# Patient Record
Sex: Female | Born: 1955 | Race: White | Hispanic: No | Marital: Married | Smoking: Former smoker
Health system: Southern US, Community
[De-identification: ages and names within clinical notes are randomized; demographics above are authoritative.]

## PROBLEM LIST (undated history)

## (undated) DIAGNOSIS — M1990 Unspecified osteoarthritis, unspecified site: Secondary | ICD-10-CM

## (undated) DIAGNOSIS — E782 Mixed hyperlipidemia: Secondary | ICD-10-CM

## (undated) DIAGNOSIS — F419 Anxiety disorder, unspecified: Secondary | ICD-10-CM

## (undated) DIAGNOSIS — E785 Hyperlipidemia, unspecified: Secondary | ICD-10-CM

## (undated) DIAGNOSIS — I10 Essential (primary) hypertension: Secondary | ICD-10-CM

## (undated) DIAGNOSIS — E119 Type 2 diabetes mellitus without complications: Secondary | ICD-10-CM

## (undated) DIAGNOSIS — M545 Low back pain, unspecified: Secondary | ICD-10-CM

## (undated) DIAGNOSIS — R7303 Prediabetes: Secondary | ICD-10-CM

## (undated) DIAGNOSIS — T4145XA Adverse effect of unspecified anesthetic, initial encounter: Secondary | ICD-10-CM

## (undated) DIAGNOSIS — G709 Myoneural disorder, unspecified: Secondary | ICD-10-CM

## (undated) DIAGNOSIS — T8859XA Other complications of anesthesia, initial encounter: Secondary | ICD-10-CM

## (undated) DIAGNOSIS — IMO0001 Reserved for inherently not codable concepts without codable children: Secondary | ICD-10-CM

## (undated) DIAGNOSIS — Z8489 Family history of other specified conditions: Secondary | ICD-10-CM

## (undated) DIAGNOSIS — M549 Dorsalgia, unspecified: Secondary | ICD-10-CM

## (undated) DIAGNOSIS — T7840XA Allergy, unspecified, initial encounter: Secondary | ICD-10-CM

## (undated) DIAGNOSIS — I2510 Atherosclerotic heart disease of native coronary artery without angina pectoris: Secondary | ICD-10-CM

## (undated) DIAGNOSIS — M79604 Pain in right leg: Secondary | ICD-10-CM

## (undated) DIAGNOSIS — I639 Cerebral infarction, unspecified: Secondary | ICD-10-CM

## (undated) HISTORY — PX: CLOSED REDUCTION ULNAR SHAFT: SHX5775

## (undated) HISTORY — DX: Cerebral infarction, unspecified: I63.9

## (undated) HISTORY — DX: Anxiety disorder, unspecified: F41.9

## (undated) HISTORY — DX: Allergy, unspecified, initial encounter: T78.40XA

## (undated) HISTORY — PX: SUPRACERVICAL ABDOMINAL HYSTERECTOMY: SHX5393

## (undated) HISTORY — DX: Dorsalgia, unspecified: M54.9

## (undated) HISTORY — PX: JOINT REPLACEMENT: SHX530

## (undated) HISTORY — PX: NASAL SINUS SURGERY: SHX719

## (undated) HISTORY — PX: ABDOMINAL HYSTERECTOMY: SHX81

## (undated) HISTORY — PX: UPPER GASTROINTESTINAL ENDOSCOPY: SHX188

## (undated) HISTORY — PX: APPENDECTOMY: SHX54

## (undated) HISTORY — DX: Low back pain: M54.5

## (undated) HISTORY — DX: Hyperlipidemia, unspecified: E78.5

## (undated) HISTORY — DX: Low back pain, unspecified: M54.50

## (undated) HISTORY — PX: ULNAR NERVE REPAIR: SHX2594

## (undated) HISTORY — PX: CATARACT EXTRACTION W/ INTRAOCULAR LENS IMPLANT: SHX1309

## (undated) HISTORY — PX: FOOT SURGERY: SHX648

## (undated) HISTORY — PX: SPINE SURGERY: SHX786

## (undated) HISTORY — DX: Pain in right leg: M79.604

## (undated) HISTORY — PX: BACK SURGERY: SHX140

## (undated) HISTORY — PX: CARPAL TUNNEL RELEASE: SHX101

## (undated) HISTORY — PX: COLONOSCOPY: SHX174

---
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
## 1975-01-22 HISTORY — PX: APPENDECTOMY: SHX54

## 1983-01-22 HISTORY — PX: SUPRACERVICAL ABDOMINAL HYSTERECTOMY: SHX5393

## 1992-01-22 HISTORY — PX: NASAL SINUS SURGERY: SHX719

## 1998-01-03 ENCOUNTER — Other Ambulatory Visit: Admission: RE | Admit: 1998-01-03 | Discharge: 1998-01-03 | Payer: Self-pay | Admitting: Obstetrics and Gynecology

## 1998-02-09 ENCOUNTER — Encounter: Payer: Self-pay | Admitting: Obstetrics and Gynecology

## 1998-02-09 ENCOUNTER — Ambulatory Visit (HOSPITAL_COMMUNITY): Admission: RE | Admit: 1998-02-09 | Discharge: 1998-02-09 | Payer: Self-pay | Admitting: Obstetrics and Gynecology

## 1998-12-06 ENCOUNTER — Ambulatory Visit (HOSPITAL_COMMUNITY): Admission: RE | Admit: 1998-12-06 | Discharge: 1998-12-06 | Payer: Self-pay | Admitting: Gastroenterology

## 1998-12-06 ENCOUNTER — Encounter: Payer: Self-pay | Admitting: Gastroenterology

## 1999-02-20 ENCOUNTER — Other Ambulatory Visit: Admission: RE | Admit: 1999-02-20 | Discharge: 1999-02-20 | Payer: Self-pay | Admitting: Obstetrics and Gynecology

## 1999-02-20 ENCOUNTER — Encounter (INDEPENDENT_AMBULATORY_CARE_PROVIDER_SITE_OTHER): Payer: Self-pay | Admitting: Specialist

## 1999-11-14 ENCOUNTER — Encounter: Admission: RE | Admit: 1999-11-14 | Discharge: 2000-02-12 | Payer: Self-pay | Admitting: Family Medicine

## 2002-04-19 ENCOUNTER — Encounter: Payer: Self-pay | Admitting: Orthopedic Surgery

## 2002-04-19 ENCOUNTER — Encounter: Admission: RE | Admit: 2002-04-19 | Discharge: 2002-04-19 | Payer: Self-pay | Admitting: Orthopedic Surgery

## 2002-04-20 ENCOUNTER — Ambulatory Visit (HOSPITAL_BASED_OUTPATIENT_CLINIC_OR_DEPARTMENT_OTHER): Admission: RE | Admit: 2002-04-20 | Discharge: 2002-04-20 | Payer: Self-pay | Admitting: Orthopedic Surgery

## 2006-05-07 ENCOUNTER — Ambulatory Visit (HOSPITAL_COMMUNITY): Admission: RE | Admit: 2006-05-07 | Discharge: 2006-05-07 | Payer: Self-pay | Admitting: Neurosurgery

## 2007-07-28 ENCOUNTER — Ambulatory Visit: Payer: Self-pay | Admitting: Gastroenterology

## 2007-09-24 ENCOUNTER — Ambulatory Visit: Payer: Self-pay | Admitting: Gastroenterology

## 2008-03-03 ENCOUNTER — Encounter: Admission: RE | Admit: 2008-03-03 | Discharge: 2008-03-03 | Payer: Self-pay | Admitting: Neurosurgery

## 2008-10-24 ENCOUNTER — Encounter: Admission: RE | Admit: 2008-10-24 | Discharge: 2008-10-24 | Payer: Self-pay | Admitting: Neurosurgery

## 2009-09-27 ENCOUNTER — Inpatient Hospital Stay (HOSPITAL_COMMUNITY): Admission: RE | Admit: 2009-09-27 | Discharge: 2009-09-29 | Payer: Self-pay | Admitting: Neurosurgery

## 2009-10-31 ENCOUNTER — Encounter: Admission: RE | Admit: 2009-10-31 | Discharge: 2009-10-31 | Payer: Self-pay | Admitting: Neurosurgery

## 2009-11-28 ENCOUNTER — Encounter: Admission: RE | Admit: 2009-11-28 | Discharge: 2009-11-28 | Payer: Self-pay | Admitting: Neurosurgery

## 2010-02-22 NOTE — Procedures (Signed)
Summary: Colonoscopy   Colonoscopy  Procedure date:  09/24/2007  Findings:      Location:  [facility].    Procedures Next Due Date:    Colonoscopy: 09/2017  Patient Name: Lauren Briggs, Lauren Briggs MRN:  Procedure Procedures: Colonoscopy CPT: 16109.  Personnel: Endoscopist: Venita Lick. Russella Dar, MD, Clementeen Graham.  Exam Location: Exam performed in Outpatient Clinic. Outpatient  Patient Consent: Procedure, Alternatives, Risks and Benefits discussed, consent obtained, from patient. Consent was obtained by the RN.  Indications  Average Risk Screening Routine.  History  Current Medications: Patient is not currently taking Coumadin.  Pre-Exam Physical: Performed Sep 24, 2007. Cardio-pulmonary exam, Rectal exam, HEENT exam , Abdominal exam, Mental status exam WNL.  Comments: Pt. history reviewed/updated, physical exam performed prior to initiation of sedation?Yes Exam Exam: Extent of exam reached: Cecum, extent intended: Cecum.  The cecum was identified by appendiceal orifice and IC valve. Time to Cecum: 00:03: 20. Time for Withdrawl: 00:09:02. Colon retroflexion performed. Images taken. ASA Classification: I. Tolerance: excellent.  Monitoring: Pulse and BP monitoring, Oximetry used. Supplemental O2 given.  Colon Prep Used MoviPrep for colon prep. Prep results: excellent.  Sedation Meds: Patient assessed and found to be appropriate for moderate (conscious) sedation. Fentanyl 100 mcg. given IV. Versed 9 mg. given IV.  Findings NORMAL EXAM: Cecum to Descending Colon.  DIVERTICULOSIS: Sigmoid Colon. Not bleeding. ICD9: Diverticulosis: 562.10. Comments: mild.  NORMAL EXAM: Rectum.   Assessment  Diagnoses: 562.10: Diverticulosis.   Events  Unplanned Interventions: No intervention was required.  Unplanned Events: There were no complications. Plans  Post Exam Instructions: Post sedation instructions given.  Patient Education: Patient given standard instructions for:  Diverticulosis.  Disposition: After procedure patient sent to recovery. After recovery patient sent home.  Scheduling/Referral: Colonoscopy, to [facility] T. Russella Dar, MD, [facility], around Sep 23, 2017.      UE:[id number] Mahaffey, MD   This report was created from the original endoscopy report, which was reviewed and signed by the above listed endoscopist.

## 2010-02-22 NOTE — Miscellaneous (Signed)
Summary: [facility] Previsit/prep  Clinical Lists Changes  Medications: Added new medication of MOVIPREP 100 GM  SOLR (PEG-KCL-NACL-NASULF-NA ASC-C) As per prep instructions. - Signed Rx of MOVIPREP 100 GM  SOLR (PEG-KCL-NACL-NASULF-NA ASC-C) As per prep instructions.;  #1 x 0;  Signed;  Entered by: Wyona Almas RN;  Authorized by: Meryl Dare MD [facility];  Method used: Electronic Allergies: Added new allergy or adverse reaction of DARVON Observations: Added new observation of NKA: F (07/28/2007 8:15)    Prescriptions: MOVIPREP 100 GM  SOLR (PEG-KCL-NACL-NASULF-NA ASC-C) As per prep instructions.  #1 x 0   Entered by:   Wyona Almas RN   Authorized by:   Meryl Dare MD [facility]   Signed by:   Wyona Almas RN on 07/28/2007   Method used:   Electronically sent to ...       [facility] # [phone]*       [address]       [place], [place]  [address]       Ph: [phone]       Fax: [phone]   RxID:   [id number]

## 2010-03-06 ENCOUNTER — Other Ambulatory Visit: Payer: Self-pay | Admitting: Neurological Surgery

## 2010-03-06 ENCOUNTER — Ambulatory Visit
Admission: RE | Admit: 2010-03-06 | Discharge: 2010-03-06 | Disposition: A | Discharge status: Home / Self Care | Payer: BC Managed Care – PPO | Source: Ambulatory Visit | Attending: Neurological Surgery | Admitting: Neurological Surgery

## 2010-03-06 DIAGNOSIS — M545 Low back pain, unspecified: Secondary | ICD-10-CM

## 2010-04-05 LAB — SURGICAL PCR SCREEN
MRSA, PCR: NEGATIVE
Staphylococcus aureus: NEGATIVE

## 2010-04-05 LAB — CBC
HCT: 41 % (ref 36.0–46.0)
Hemoglobin: 13.7 g/dL (ref 12.0–15.0)
MCH: 31.6 pg (ref 26.0–34.0)
MCHC: 33.4 g/dL (ref 30.0–36.0)
MCV: 94.5 fL (ref 78.0–100.0)
Platelets: 256 10*3/uL (ref 150–400)
RBC: 4.34 MIL/uL (ref 3.87–5.11)
RDW: 12.5 % (ref 11.5–15.5)
WBC: 5.5 10*3/uL (ref 4.0–10.5)

## 2010-04-05 LAB — TYPE AND SCREEN
ABO/RH(D): O POS
Antibody Screen: NEGATIVE

## 2010-04-05 LAB — ABO/RH: ABO/RH(D): O POS

## 2010-05-29 ENCOUNTER — Other Ambulatory Visit: Payer: Self-pay | Admitting: Neurosurgery

## 2010-05-29 ENCOUNTER — Ambulatory Visit
Admission: RE | Admit: 2010-05-29 | Discharge: 2010-05-29 | Disposition: A | Discharge status: Home / Self Care | Payer: BC Managed Care – PPO | Source: Ambulatory Visit | Attending: Neurosurgery | Admitting: Neurosurgery

## 2010-05-29 DIAGNOSIS — M47817 Spondylosis without myelopathy or radiculopathy, lumbosacral region: Secondary | ICD-10-CM

## 2010-05-29 DIAGNOSIS — M5126 Other intervertebral disc displacement, lumbar region: Secondary | ICD-10-CM

## 2010-05-29 DIAGNOSIS — M5137 Other intervertebral disc degeneration, lumbosacral region: Secondary | ICD-10-CM

## 2010-06-08 NOTE — Op Note (Signed)
NAME:  Lauren Briggs, Lauren Briggs                 ACCOUNT NO.:  [id number]   MEDICAL RECORD NO.:  [id number]          PATIENT TYPE:  OIB   LOCATION:  3006                         FACILITY:  [facility]   PHYSICIAN:  Donalee Citrin, M.D.        DATE OF BIRTH:  January 11, 1956   DATE OF PROCEDURE:  05/07/2006  DATE OF DISCHARGE:                               OPERATIVE REPORT   PREOPERATIVE DIAGNOSIS:  Left L3 radiculopathy from extraforaminal and  foraminal disk herniation, L3-4, left.   PROCEDURE:  Extraforaminal approach to lumbar microdiskectomy.   SURGEON:  Donalee Citrin, M.D.   [name].   ANESTHESIA:  General endotracheal.   HISTORY OF PRESENT ILLNESS:  The patient is a very pleasant 55 year old  female who has had longstanding back and left buttock and hip pain  radiating down the front of her left thigh to her knee.  This has been  refractory to all forms of conservative treatment.  MRI scan showed  sizeable foraminal disk herniation causing severe stenosis on the 3  root.  The patient failed all forms of conservative treatment  preoperatively and has had some trace weakness of her left quadriceps  and some decreased knee jerk and due to the patient's failure of  conservative treatment, physical exam, and MRI findings, the patient is  recommended extraforaminal discectomy at this level.  The risks and  benefits of the operation were explained to the patient who understood  and agreed to procedure.   The patient was brought to the OR, induced under general anesthesia,  placed prone on the Wilson frame.  Back was prepped and draped in the  usual sterile fashion.  Preoperative x-ray localized the L3-4 disk  space.  After infiltration of 10 mL of lidocaine with epinephrine, a  midline incision made with Bovie electrocautery was used to take down  subperiosteal dissection carried out on the lamina and facet complex at  L3-4 and the L3 transverse process was identified, marked, and confirmed  by  x-ray.  Then inferior to the L3 transverse process, the endocervix of  the facet complex lateral pars and superior aspect of the facet complex  at L4 was drilled down to gain access to the extraforaminal compartment.  At this point, the operating microscope was draped, brought into the  field under microscopic illumination using a #4 Penfield.  The  undersurface of the lateral pars was dissected off of the  intertransverse ligament and this was removed in a piecemeal fashion  with 2-3 mm Kerrison punch.  Then the L3 nerve root was immediately  identified and the L3-4 disk space and the lateral disk herniation was  immediately identified, causing severe stenosis and displacement of the  L3 nerve root superiorly.  So epidural branch was coagulated.  Annulotomy was made in the disk space.  Several osteophytes were removed  from immediately at the undersurface of the 3 root.  Then the interspace  was entered with pituitary rongeurs and cleaned out.  Aggressive  foraminotomy was continued of the 3 root and aggressive discectomy to  confirm all fragments had  been removed.  At the end of the discectomy  and foraminotomy, there was no further stenosis on the 3 root.  A  __________ easily passed up proximally and distally and it was felt to  be widely decompressed.  The wound was then copiously irrigated and  meticulous hemostasis was maintained.  Gelfoam was overlayed on top of  the nerve root in the  extraforaminal compartment.  The wound was closed in layers with  interrupted Vicryl and running 4-0 subcuticular.  Benzoin and Steri-  Strips were applied.  The patient went to recovery room in stable  condition.  At the end of the case, needle counts and sponge counts were  correct.           ______________________________  Donalee Citrin, M.D.     GC/MEDQ  D:  05/07/2006  T:  05/07/2006  Job:  [id number]

## 2010-06-08 NOTE — Op Note (Signed)
NAME:  Lauren Briggs, Lauren Briggs                           ACCOUNT NO.:  [id number]   MEDICAL RECORD NO.:  [id number]                   PATIENT TYPE:  AMB   LOCATION:  DSC                                  FACILITY:  [facility]   PHYSICIAN:  Katy Fitch. Naaman Plummer., M.D.          DATE OF BIRTH:  03-17-1955   DATE OF PROCEDURE:  04/20/2002  DATE OF DISCHARGE:                                 OPERATIVE REPORT   PREOPERATIVE DIAGNOSIS:  Chronic ulnocarpal abutment right wrist  unresponsive to activity modification, anti-inflammatory medication, and  rest with plain film evidence of a 2 mm ulnar plus variant and magnetic  resonance imaging evidence of chronic ulnocarpal abutment, a central  triangular fibrocartilage degenerative tear and carpal edema as a  consequence of chronic abutment.   POSTOPERATIVE DIAGNOSIS:  Chronic ulnocarpal abutment right wrist  unresponsive to activity modification, anti-inflammatory medication, and  rest with plain film evidence of a 2 mm ulnar plus variant and magnetic  resonance imaging evidence of chronic ulnocarpal abutment, a central  triangular fibrocartilage degenerative tear and carpal edema as a  consequence of chronic abutment.   OPERATION:  1. Diagnostic arthroscopy right wrist.  2. Arthroscopic debridement of triangular fibrocartilage and dorsal synovial     plica type lesion.  3. Resection of distal 3 mm of ulnar in the manner of Feldon utilizing an     oscillating saw and microcurets.   OPERATION:  Katy Fitch. Sypher, M.D.   ASSISTANT:  Jonni Sanger, P.A.   ANESTHESIA:  General by LMA.   SUPERVISING ANESTHESIOLOGIST:  Kaylyn Layer. Michelle Piper, M.D.   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  She is status post left wrist arthroscopy and triangular  fibrocartilage debridement for a similar predicament approximately four  years ago.   She had a  good response to simple debridement on the left.  She is also  status post bilateral carpal tunnel release.   During the past year, she has had progressive pain on the ulnar aspect of  her right wrist consistent with ulnocarpal abutment.  She did not respond to  rest, activity modification and anti-inflammatories.  An MRI of the wrist  was obtained by her primary care physician.  It was positive for chronic  ulnocarpal abutment with a triangular fibrocartilage tear.   Due to a failure to respond to nonoperative measures, she is brought to the  operating room at this time, anticipating diagnostic arthroscopy as well as  debridement of her joint and open distal ulnar resection.   DESCRIPTION OF PROCEDURE:  The patient is brought to the operating room and  placed in supine position upon the operating table.  Following anesthesia  consultation by Dr. Michelle Piper, general anesthesia by LMA was induced.   The right arm was prepped with Betadine soap  and solution and sterilely  draped.  1 gram of Ancef was administered as an IV prophylactic antibiotic.  Following exsanguination of the right arm with an Esmarch bandage, an  arterial tourniquet on the proximal brachium was inflated to 220 mmHg.   The procedure commenced with placement of the hand and wrist in a  distraction tower with finger traps on the index and long fingers and  counter traction on the proximal forearm.  10 pounds traction was applied.  The wrist was sounded with an 18 gauge needle and distended with sterile  saline.   The scope was introduced through a standard three port dorsal portal with  blunt technique.  Diagnostic arthroscopy revealed intact articular  cartilages surfaces on the scaphoid, radial aspect of the lunate and  triquetrum.  The distal radius was virtually normal appearing.  There was a  central degenerative tear of the triangular fibrocartilage.  The dorsal  aspect of the triangular fibrocartilage was very  firmly abutting the lunate  and there was significant chondromalacia on the adjacent surfaces of the  lunate and triquetrum.  This would correspond to the sites of edema noted on  the MRI.   We are unable to instrument the joint with a 2.9 mm suction shaver due to  the degree of decompression across the ulnocarpal space.  Therefore a 2 mm  shaver was utilized to debride a dorsal plica synovial shelf that had formed  due to the chronic abutment.   We then proceeded directly to the open distal ulnar section.  With the hand  and wrist still in the distraction tower, a curvilinear incision was  fashioned over the distal ulna dorsally.  Care was taken to identify the  dorsal sensory branches of the ulnar nerve followed by identifications of  the fifth and sixth dorsal compartments.  A distal radial ulnar joint  arthrotomy was accomplished revealing rather abutment synovial hypertrophy.  A rongeur was used to clear all synovium followed by identification of the  anatomy of the distal ulna.  A total of three Freer periosteal elevators  were placed to protect the joints, followed by use of a 9 mm oscillating saw  to resect the distal 3 mm of the ulna.   The wrist was studied in pronation and supination.  In pronation there  appeared to be residual abutment.  Therefore, with the wrist in full  pronation the oscillating saw was used to remove an additional 3 mm of bone  on the palmar aspect of the ulna with the wrist in full pronation.   PA and AP images of the wrist utilizing C-arm fluoroscope documented a very  satisfactory decompression.  The wound was thoroughly lavaged with sterile  saline under pressure utilizing a saline pump followed by repair of the  capsule of the distal radial ulnar joint with mattress sutures of 3-0  Ethibon and repair of the extensor retinaculum with mattress sutures of 3-0  Ethibon.  The skin was painted with intradermal 3-0 Prolene followed by placement and  a  voluminous gauze dressing and a sugar tong splint maintaining the wrist in  neutral position and the forearm in full supination.   There were no apparent complications.   For aftercare, the patient will be maintained in supination for three weeks.  She is given prescriptions for Percocet 5 mg one or two tablets p.o. q.4-6h.  p.r.n. pain, total of 24 tablets without refill.  Also Keflex 500 mg one  p.o. q.8h. x4 days as a  prophylactic antibiotic and ibuprofen 600 mg one  p.o. q.6h. p.r.n. pain.                                               Katy Fitch Naaman Plummer., M.D.   RVS/MEDQ  D:  04/20/2002  T:  04/20/2002  Job:  [id number]

## 2010-08-09 ENCOUNTER — Other Ambulatory Visit: Payer: Self-pay | Admitting: Neurosurgery

## 2010-08-09 ENCOUNTER — Ambulatory Visit
Admission: RE | Admit: 2010-08-09 | Discharge: 2010-08-09 | Disposition: A | Discharge status: Home / Self Care | Payer: BC Managed Care – PPO | Source: Ambulatory Visit | Attending: Neurosurgery | Admitting: Neurosurgery

## 2010-08-09 DIAGNOSIS — M545 Low back pain, unspecified: Secondary | ICD-10-CM

## 2010-08-09 DIAGNOSIS — M25559 Pain in unspecified hip: Secondary | ICD-10-CM

## 2010-08-10 ENCOUNTER — Ambulatory Visit
Admission: RE | Admit: 2010-08-10 | Discharge: 2010-08-10 | Disposition: A | Discharge status: Home / Self Care | Payer: BC Managed Care – PPO | Source: Ambulatory Visit | Attending: Neurosurgery | Admitting: Neurosurgery

## 2010-08-10 DIAGNOSIS — M545 Low back pain, unspecified: Secondary | ICD-10-CM

## 2010-08-10 DIAGNOSIS — M25559 Pain in unspecified hip: Secondary | ICD-10-CM

## 2010-12-05 ENCOUNTER — Other Ambulatory Visit: Payer: Self-pay | Admitting: Neurosurgery

## 2010-12-05 DIAGNOSIS — M545 Low back pain, unspecified: Secondary | ICD-10-CM

## 2010-12-27 ENCOUNTER — Ambulatory Visit
Admission: RE | Admit: 2010-12-27 | Discharge: 2010-12-27 | Disposition: A | Discharge status: Home / Self Care | Payer: BC Managed Care – PPO | Source: Ambulatory Visit | Attending: Neurosurgery | Admitting: Neurosurgery

## 2010-12-27 DIAGNOSIS — M545 Low back pain, unspecified: Secondary | ICD-10-CM

## 2011-01-23 ENCOUNTER — Ambulatory Visit: Payer: BC Managed Care – PPO

## 2011-01-27 ENCOUNTER — Ambulatory Visit (INDEPENDENT_AMBULATORY_CARE_PROVIDER_SITE_OTHER): Payer: BC Managed Care – PPO

## 2011-01-27 DIAGNOSIS — J019 Acute sinusitis, unspecified: Secondary | ICD-10-CM

## 2011-01-27 DIAGNOSIS — R51 Headache: Secondary | ICD-10-CM

## 2011-01-30 ENCOUNTER — Ambulatory Visit (INDEPENDENT_AMBULATORY_CARE_PROVIDER_SITE_OTHER): Payer: Self-pay

## 2011-01-30 DIAGNOSIS — F4320 Adjustment disorder, unspecified: Secondary | ICD-10-CM

## 2011-02-12 ENCOUNTER — Ambulatory Visit (INDEPENDENT_AMBULATORY_CARE_PROVIDER_SITE_OTHER): Payer: Self-pay

## 2011-02-12 DIAGNOSIS — F4320 Adjustment disorder, unspecified: Secondary | ICD-10-CM

## 2011-02-20 ENCOUNTER — Ambulatory Visit (INDEPENDENT_AMBULATORY_CARE_PROVIDER_SITE_OTHER): Payer: BC Managed Care – PPO

## 2011-02-20 DIAGNOSIS — F4320 Adjustment disorder, unspecified: Secondary | ICD-10-CM

## 2011-02-27 ENCOUNTER — Ambulatory Visit (INDEPENDENT_AMBULATORY_CARE_PROVIDER_SITE_OTHER): Payer: Self-pay

## 2011-02-27 DIAGNOSIS — F4320 Adjustment disorder, unspecified: Secondary | ICD-10-CM

## 2011-03-06 ENCOUNTER — Ambulatory Visit (INDEPENDENT_AMBULATORY_CARE_PROVIDER_SITE_OTHER): Payer: Self-pay

## 2011-03-06 DIAGNOSIS — F4320 Adjustment disorder, unspecified: Secondary | ICD-10-CM

## 2011-04-29 DIAGNOSIS — L900 Lichen sclerosus et atrophicus: Secondary | ICD-10-CM | POA: Insufficient documentation

## 2011-04-29 DIAGNOSIS — Z79890 Hormone replacement therapy (postmenopausal): Secondary | ICD-10-CM | POA: Insufficient documentation

## 2011-07-30 ENCOUNTER — Ambulatory Visit
Admission: RE | Admit: 2011-07-30 | Discharge: 2011-07-30 | Disposition: A | Discharge status: Home / Self Care | Payer: BC Managed Care – PPO | Source: Ambulatory Visit | Attending: Neurosurgery | Admitting: Neurosurgery

## 2011-07-30 ENCOUNTER — Other Ambulatory Visit: Payer: Self-pay | Admitting: Neurosurgery

## 2011-07-30 DIAGNOSIS — IMO0002 Reserved for concepts with insufficient information to code with codable children: Secondary | ICD-10-CM

## 2011-07-30 DIAGNOSIS — M47817 Spondylosis without myelopathy or radiculopathy, lumbosacral region: Secondary | ICD-10-CM

## 2011-07-30 MED ORDER — GADOBENATE DIMEGLUMINE 529 MG/ML IV SOLN
18.0000 mL | Freq: Once | INTRAVENOUS | Status: AC | PRN
  Administered 2011-07-30: 18 mL via INTRAVENOUS

## 2011-11-05 ENCOUNTER — Other Ambulatory Visit: Payer: Self-pay | Admitting: Neurosurgery

## 2011-11-05 DIAGNOSIS — M48061 Spinal stenosis, lumbar region without neurogenic claudication: Secondary | ICD-10-CM

## 2011-11-13 ENCOUNTER — Ambulatory Visit
Admission: RE | Admit: 2011-11-13 | Discharge: 2011-11-13 | Disposition: A | Discharge status: Home / Self Care | Payer: BC Managed Care – PPO | Source: Ambulatory Visit | Attending: Neurosurgery | Admitting: Neurosurgery

## 2011-11-13 DIAGNOSIS — M48061 Spinal stenosis, lumbar region without neurogenic claudication: Secondary | ICD-10-CM

## 2012-03-12 ENCOUNTER — Other Ambulatory Visit: Payer: Self-pay | Admitting: Neurosurgery

## 2012-04-07 ENCOUNTER — Telehealth: Payer: Self-pay | Admitting: Family Medicine

## 2012-04-07 NOTE — Telephone Encounter (Signed)
Pt has appointment on 04/20/12 for medication refill. She would like to have labs drawn at outside [facility]. Can you put lab order in [facility] so that when she goes over there she can get it drawn before appointment?

## 2012-04-08 ENCOUNTER — Encounter: Payer: Self-pay | Admitting: Family Medicine

## 2012-04-08 DIAGNOSIS — E785 Hyperlipidemia, unspecified: Secondary | ICD-10-CM

## 2012-04-08 NOTE — Telephone Encounter (Signed)
This encounter was created in error - please disregard.

## 2012-04-08 NOTE — Telephone Encounter (Signed)
Notify patient I think I ordered her labs and she can go get them.

## 2012-04-08 NOTE — Telephone Encounter (Signed)
Pt aware.

## 2012-04-13 ENCOUNTER — Telehealth: Payer: Self-pay | Admitting: *Deleted

## 2012-04-13 ENCOUNTER — Other Ambulatory Visit: Payer: Self-pay | Admitting: Family Medicine

## 2012-04-13 ENCOUNTER — Other Ambulatory Visit: Payer: BC Managed Care – PPO

## 2012-04-13 ENCOUNTER — Encounter (HOSPITAL_COMMUNITY): Payer: Self-pay | Admitting: Respiratory Therapy

## 2012-04-13 ENCOUNTER — Other Ambulatory Visit: Payer: Self-pay | Admitting: Neurosurgery

## 2012-04-13 DIAGNOSIS — M5126 Other intervertebral disc displacement, lumbar region: Secondary | ICD-10-CM

## 2012-04-13 DIAGNOSIS — IMO0002 Reserved for concepts with insufficient information to code with codable children: Secondary | ICD-10-CM

## 2012-04-13 DIAGNOSIS — M48061 Spinal stenosis, lumbar region without neurogenic claudication: Secondary | ICD-10-CM

## 2012-04-13 DIAGNOSIS — Z0000 Encounter for general adult medical examination without abnormal findings: Secondary | ICD-10-CM

## 2012-04-13 LAB — CBC WITH DIFFERENTIAL/PLATELET
Basophils Absolute: 0.1 10*3/uL (ref 0.0–0.1)
Basophils Relative: 1 % (ref 0–1)
Eosinophils Absolute: 0.1 10*3/uL (ref 0.0–0.7)
Eosinophils Relative: 2 % (ref 0–5)
HCT: 40.6 % (ref 36.0–46.0)
Hemoglobin: 14.5 g/dL (ref 12.0–15.0)
Lymphocytes Relative: 39 % (ref 12–46)
Lymphs Abs: 2.2 10*3/uL (ref 0.7–4.0)
MCH: 31.7 pg (ref 26.0–34.0)
MCHC: 35.7 g/dL (ref 30.0–36.0)
MCV: 88.6 fL (ref 78.0–100.0)
Monocytes Absolute: 0.4 10*3/uL (ref 0.1–1.0)
Monocytes Relative: 7 % (ref 3–12)
Neutro Abs: 3 10*3/uL (ref 1.7–7.7)
Neutrophils Relative %: 51 % (ref 43–77)
Platelets: 287 10*3/uL (ref 150–400)
RBC: 4.58 MIL/uL (ref 3.87–5.11)
RDW: 14.3 % (ref 11.5–15.5)
WBC: 5.8 10*3/uL (ref 4.0–10.5)

## 2012-04-13 LAB — LIPID PANEL
Cholesterol: 192 mg/dL (ref 0–200)
HDL: 74 mg/dL (ref >39)
LDL Cholesterol: 92 mg/dL (ref 0–99)
Total CHOL/HDL Ratio: 2.6 Ratio
Triglycerides: 129 mg/dL (ref <150)
VLDL: 26 mg/dL (ref 0–40)

## 2012-04-13 LAB — COMPREHENSIVE METABOLIC PANEL
ALT: 34 U/L (ref 0–35)
AST: 29 U/L (ref 0–37)
Albumin: 4.6 g/dL (ref 3.5–5.2)
Alkaline Phosphatase: 46 U/L (ref 39–117)
BUN: 20 mg/dL (ref 6–23)
CO2: 27 mEq/L (ref 19–32)
Calcium: 9.8 mg/dL (ref 8.4–10.5)
Chloride: 103 mEq/L (ref 96–112)
Creat: 0.65 mg/dL (ref 0.50–1.10)
Glucose, Bld: 94 mg/dL (ref 70–99)
Potassium: 4.4 mEq/L (ref 3.5–5.3)
Sodium: 139 mEq/L (ref 135–145)
Total Bilirubin: 0.4 mg/dL (ref 0.3–1.2)
Total Protein: 6.9 g/dL (ref 6.0–8.3)

## 2012-04-13 NOTE — Telephone Encounter (Signed)
Completed by Legrand Rams

## 2012-04-14 ENCOUNTER — Encounter (HOSPITAL_COMMUNITY): Admission: RE | Admit: 2012-04-14 | Payer: BC Managed Care – PPO | Source: Ambulatory Visit

## 2012-04-17 ENCOUNTER — Ambulatory Visit
Admission: RE | Admit: 2012-04-17 | Discharge: 2012-04-17 | Disposition: A | Discharge status: Home / Self Care | Payer: BC Managed Care – PPO | Source: Ambulatory Visit | Attending: Neurosurgery | Admitting: Neurosurgery

## 2012-04-17 DIAGNOSIS — M5126 Other intervertebral disc displacement, lumbar region: Secondary | ICD-10-CM

## 2012-04-17 DIAGNOSIS — IMO0002 Reserved for concepts with insufficient information to code with codable children: Secondary | ICD-10-CM

## 2012-04-17 DIAGNOSIS — M48061 Spinal stenosis, lumbar region without neurogenic claudication: Secondary | ICD-10-CM

## 2012-04-20 ENCOUNTER — Ambulatory Visit (HOSPITAL_COMMUNITY)
Admission: RE | Admit: 2012-04-20 | Discharge: 2012-04-20 | Disposition: A | Discharge status: Home / Self Care | Payer: BC Managed Care – PPO | Source: Ambulatory Visit | Attending: Neurosurgery | Admitting: Neurosurgery

## 2012-04-20 ENCOUNTER — Encounter (HOSPITAL_COMMUNITY): Payer: Self-pay

## 2012-04-20 ENCOUNTER — Ambulatory Visit (INDEPENDENT_AMBULATORY_CARE_PROVIDER_SITE_OTHER): Payer: BC Managed Care – PPO | Admitting: Family Medicine

## 2012-04-20 ENCOUNTER — Encounter: Payer: Self-pay | Admitting: Family Medicine

## 2012-04-20 ENCOUNTER — Encounter (HOSPITAL_COMMUNITY)
Admission: RE | Admit: 2012-04-20 | Discharge: 2012-04-20 | Disposition: A | Discharge status: Home / Self Care | Payer: BC Managed Care – PPO | Source: Ambulatory Visit | Attending: Neurosurgery | Admitting: Neurosurgery

## 2012-04-20 VITALS — BP 120/78 | HR 80 | Temp 98.4°F | Resp 18 | Wt 216.0 lb

## 2012-04-20 DIAGNOSIS — M545 Low back pain, unspecified: Secondary | ICD-10-CM | POA: Insufficient documentation

## 2012-04-20 DIAGNOSIS — M79604 Pain in right leg: Secondary | ICD-10-CM | POA: Insufficient documentation

## 2012-04-20 DIAGNOSIS — E782 Mixed hyperlipidemia: Secondary | ICD-10-CM

## 2012-04-20 DIAGNOSIS — E785 Hyperlipidemia, unspecified: Secondary | ICD-10-CM

## 2012-04-20 DIAGNOSIS — Z01818 Encounter for other preprocedural examination: Secondary | ICD-10-CM | POA: Insufficient documentation

## 2012-04-20 DIAGNOSIS — Z01812 Encounter for preprocedural laboratory examination: Secondary | ICD-10-CM | POA: Insufficient documentation

## 2012-04-20 DIAGNOSIS — Z87891 Personal history of nicotine dependence: Secondary | ICD-10-CM | POA: Insufficient documentation

## 2012-04-20 DIAGNOSIS — Z01810 Encounter for preprocedural cardiovascular examination: Secondary | ICD-10-CM | POA: Insufficient documentation

## 2012-04-20 HISTORY — DX: Mixed hyperlipidemia: E78.2

## 2012-04-20 HISTORY — DX: Adverse effect of unspecified anesthetic, initial encounter: T41.45XA

## 2012-04-20 HISTORY — DX: Other complications of anesthesia, initial encounter: T88.59XA

## 2012-04-20 LAB — CBC
HCT: 39.6 % (ref 36.0–46.0)
Hemoglobin: 13.5 g/dL (ref 12.0–15.0)
MCH: 31 pg (ref 26.0–34.0)
MCHC: 34.1 g/dL (ref 30.0–36.0)
MCV: 90.8 fL (ref 78.0–100.0)
Platelets: 262 10*3/uL (ref 150–400)
RBC: 4.36 MIL/uL (ref 3.87–5.11)
RDW: 13 % (ref 11.5–15.5)
WBC: 6.3 10*3/uL (ref 4.0–10.5)

## 2012-04-20 LAB — BASIC METABOLIC PANEL
BUN: 19 mg/dL (ref 6–23)
CO2: 28 mEq/L (ref 19–32)
Calcium: 9.7 mg/dL (ref 8.4–10.5)
Chloride: 101 mEq/L (ref 96–112)
Creatinine, Ser: 0.59 mg/dL (ref 0.50–1.10)
GFR calc Af Amer: >90 mL/min (ref >90)
GFR calc non Af Amer: >90 mL/min (ref >90)
Glucose, Bld: 110 mg/dL — ABNORMAL HIGH (ref 70–99)
Potassium: 4.1 mEq/L (ref 3.5–5.1)
Sodium: 139 mEq/L (ref 135–145)

## 2012-04-20 LAB — SURGICAL PCR SCREEN
MRSA, PCR: NEGATIVE
Staphylococcus aureus: NEGATIVE

## 2012-04-20 LAB — TYPE AND SCREEN
ABO/RH(D): O POS
Antibody Screen: NEGATIVE

## 2012-04-20 NOTE — Pre-Procedure Instructions (Signed)
KERIANA SARSFIELD  04/20/2012   Your procedure is scheduled on:  Wednesday  04/22/12   Report to [facility] at 630 AM.  Call this number if you have problems the morning of surgery: [phone]   Remember:   Do not eat food or drink liquids after midnight.   Take these medicines the morning of surgery with A SIP OF WATER:  ESTRADIOL, HYDROCODONE IF NEEDED   Do not wear jewelry, make-up or nail polish.  Do not wear lotions, powders, or perfumes. You may wear deodorant.  Do not shave 48 hours prior to surgery. Men may shave face and neck.  Do not bring valuables to the hospital.  Contacts, dentures or bridgework may not be worn into surgery.  Leave suitcase in the car. After surgery it may be brought to your room.  For patients admitted to the hospital, checkout time is 11:00 AM the day of  discharge.   Patients discharged the day of surgery will not be allowed to drive  home.  Name and phone number of your driver:   Special Instructions: Shower using CHG 2 nights before surgery and the night before surgery.  If you shower the day of surgery use CHG.  Use special wash - you have one bottle of CHG for all showers.  You should use approximately 1/3 of the bottle for each shower.   Please read over the following fact sheets that you were given: Pain Booklet, Coughing and Deep Breathing, Blood Transfusion Information, MRSA Information and Surgical Site Infection Prevention

## 2012-04-20 NOTE — Progress Notes (Signed)
Subjective:     Patient ID: Lauren Briggs, female   DOB: 1955-09-16, 57 y.o.   MRN: [id number]  HPI This 57 year-old female here today for followup of her hyperlipidemia.  She is currently on Vytorin 10/20 one by mouth daily. She denies any right upper quadrant pain. She denies any houses. She is currently scheduled for surgery on a ruptured disc at L5-S1 by Dr. Wynetta Emery. This is be performed in 2 days.  Aside from the low back pain and right leg sciatica she's having no concerns. Review of her recent lab work reveals a CMP that shows no evidence of liver irritation, and HDL of 74, and an LDL of less than 100. Past Medical History  Diagnosis Date  . Low back pain radiating to right leg   . Hyperlipidemia    Current Outpatient Prescriptions on File Prior to Visit  Medication Sig Dispense Refill  . CINNAMON PO Take 1 capsule by mouth 2 (two) times daily.      . clobetasol cream (TEMOVATE) 0.05 % Apply 1 application topically daily.      . Cranberry 600 MG TABS Take 600 mg by mouth daily.      Marland Kitchen estradiol (ESTRACE) 0.5 MG tablet Take 0.5 mg by mouth daily.      Marland Kitchen ezetimibe-simvastatin (VYTORIN) 10-20 MG per tablet Take 1 tablet by mouth at bedtime.      Marland Kitchen GRAPE SEED EXTRACT PO Take 1 tablet by mouth daily.      Marland Kitchen HYDROcodone-acetaminophen (NORCO) 7.5-325 MG per tablet Take 1 tablet by mouth every 6 (six) hours as needed for pain.      . Multiple Vitamin (MULTIVITAMIN WITH MINERALS) TABS Take 1 tablet by mouth daily.      . Omega-3 Fatty Acids (FISH OIL) 1200 MG CAPS Take 3,600 mg by mouth daily.      . valACYclovir (VALTREX) 1000 MG tablet Take 1,000 mg by mouth daily.       No current facility-administered medications on file prior to visit.   History   Social History  . Marital Status: Married    Spouse Name: N/A    Number of Children: N/A  . Years of Education: N/A   Occupational History  . Not on file.   Social History Main Topics  . Smoking status: Former Smoker    Types:  Cigarettes  . Smokeless tobacco: Former Neurosurgeon    Quit date: 01/22/1979  . Alcohol Use: No  . Drug Use: No  . Sexually Active: Not on file   Other Topics Concern  . Not on file   Social History Narrative  . No narrative on file    Review of Systems     Objective:   Physical Exam  Constitutional: She appears well-developed and well-nourished.  Cardiovascular: Normal rate, regular rhythm and normal heart sounds.   No murmur heard. Pulmonary/Chest: Effort normal and breath sounds normal. No respiratory distress. She has no wheezes. She has no rales.  Abdominal: Soft. Bowel sounds are normal. She exhibits no distension. There is no tenderness. There is no rebound.   she has trace bipedal edema     Assessment:     Hyperlipidemia Obesity    Plan:     She's gained 20 pounds since her last office visit. We attributed this to her inactivity due to her low back pain and planned surgery. I encouraged her to begin aerobic exercise as soon as she is cleared from a surgical standpoint. Also recommended diet less  than 1600 calories per day.  Regards to her hyperlipidemia, her cholesterol is very well controlled we'll continue Vytorin at the present dose.  Followup in 6 months

## 2012-04-21 MED ORDER — CEFAZOLIN SODIUM-DEXTROSE 2-3 GM-% IV SOLR
2.0000 g | INTRAVENOUS | Status: AC
  Administered 2012-04-22 (×2): 2 g via INTRAVENOUS
  Filled 2012-04-21: qty 50

## 2012-04-21 NOTE — Progress Notes (Signed)
Anesthesia chart review: Patient is a 57 year old female scheduled for L3-S1 fusion by Dr. Wynetta Emery on 04/22/2012. History includes former smoker and, hyperlipidemia, nasal sinus surgery, hysterectomy, L3-4, L4-5 XLIF '11, L3-4 microdiskectomy '08. Anesthesia complications includes a chipped front tooth.  EKG on 04/20/2012 showed normal sinus rhythm, cannot rule out anterior infarct, age undetermined. It was not felt significantly changed from her previous EKG on 09/26/09.  Preoperative chest x-ray and labs noted.  Anticipate she can proceed as planned.  Velna Ochs [facility]/Anesthesiology Phone [phone] 04/21/2012 9:20 AM

## 2012-04-22 ENCOUNTER — Encounter (HOSPITAL_COMMUNITY): Payer: Self-pay | Admitting: Vascular Surgery

## 2012-04-22 ENCOUNTER — Encounter (HOSPITAL_COMMUNITY): Payer: Self-pay | Admitting: *Deleted

## 2012-04-22 ENCOUNTER — Inpatient Hospital Stay (HOSPITAL_COMMUNITY)
Admission: RE | Admit: 2012-04-22 | Discharge: 2012-04-26 | DRG: 756 | Disposition: A | Discharge status: Home / Self Care | Payer: BC Managed Care – PPO | Source: Ambulatory Visit | Attending: Neurosurgery | Admitting: Neurosurgery

## 2012-04-22 ENCOUNTER — Inpatient Hospital Stay (HOSPITAL_COMMUNITY): Payer: BC Managed Care – PPO | Admitting: Certified Registered"

## 2012-04-22 ENCOUNTER — Encounter (HOSPITAL_COMMUNITY)
Admission: RE | Disposition: A | Discharge status: Home / Self Care | Payer: Self-pay | Source: Ambulatory Visit | Attending: Neurosurgery

## 2012-04-22 ENCOUNTER — Inpatient Hospital Stay (HOSPITAL_COMMUNITY): Payer: BC Managed Care – PPO

## 2012-04-22 DIAGNOSIS — M5137 Other intervertebral disc degeneration, lumbosacral region: Secondary | ICD-10-CM | POA: Diagnosis present

## 2012-04-22 DIAGNOSIS — M7130 Other bursal cyst, unspecified site: Secondary | ICD-10-CM | POA: Diagnosis present

## 2012-04-22 DIAGNOSIS — Z79899 Other long term (current) drug therapy: Secondary | ICD-10-CM

## 2012-04-22 DIAGNOSIS — M25559 Pain in unspecified hip: Secondary | ICD-10-CM | POA: Diagnosis not present

## 2012-04-22 DIAGNOSIS — T84498A Other mechanical complication of other internal orthopedic devices, implants and grafts, initial encounter: Principal | ICD-10-CM | POA: Diagnosis present

## 2012-04-22 DIAGNOSIS — M51379 Other intervertebral disc degeneration, lumbosacral region without mention of lumbar back pain or lower extremity pain: Secondary | ICD-10-CM | POA: Diagnosis present

## 2012-04-22 DIAGNOSIS — Z87891 Personal history of nicotine dependence: Secondary | ICD-10-CM

## 2012-04-22 DIAGNOSIS — E785 Hyperlipidemia, unspecified: Secondary | ICD-10-CM | POA: Diagnosis present

## 2012-04-22 DIAGNOSIS — Z981 Arthrodesis status: Secondary | ICD-10-CM

## 2012-04-22 DIAGNOSIS — Y832 Surgical operation with anastomosis, bypass or graft as the cause of abnormal reaction of the patient, or of later complication, without mention of misadventure at the time of the procedure: Secondary | ICD-10-CM | POA: Diagnosis present

## 2012-04-22 SURGERY — POSTERIOR LUMBAR FUSION 3 LEVEL
Anesthesia: General | Site: Back | Wound class: Clean

## 2012-04-22 MED ORDER — PROPOFOL 10 MG/ML IV BOLUS
INTRAVENOUS | Status: DC | PRN
  Administered 2012-04-22: 200 mg via INTRAVENOUS

## 2012-04-22 MED ORDER — SODIUM CHLORIDE 0.9 % IJ SOLN: 3.0000 mL | INTRAMUSCULAR | Status: DC | PRN

## 2012-04-22 MED ORDER — ESTRADIOL 1 MG PO TABS
0.5000 mg | ORAL_TABLET | Freq: Every day | ORAL | Status: DC
  Administered 2012-04-23 – 2012-04-24 (×2): 0.5 mg via ORAL
  Administered 2012-04-25: 10:00:00 via ORAL
  Filled 2012-04-22 (×4): qty 0.5

## 2012-04-22 MED ORDER — BACITRACIN 50000 UNITS IM SOLR
INTRAMUSCULAR | Status: AC
  Filled 2012-04-22: qty 1

## 2012-04-22 MED ORDER — CYCLOBENZAPRINE HCL 10 MG PO TABS
10.0000 mg | ORAL_TABLET | Freq: Three times a day (TID) | ORAL | Status: DC | PRN
  Administered 2012-04-22 – 2012-04-25 (×9): 10 mg via ORAL
  Filled 2012-04-22 (×7): qty 1

## 2012-04-22 MED ORDER — DEXAMETHASONE SODIUM PHOSPHATE 10 MG/ML IJ SOLN
INTRAMUSCULAR | Status: AC
  Filled 2012-04-22: qty 1

## 2012-04-22 MED ORDER — ACETAMINOPHEN 10 MG/ML IV SOLN
1000.0000 mg | Freq: Four times a day (QID) | INTRAVENOUS | Status: DC
  Administered 2012-04-22: 1000 mg via INTRAVENOUS
  Filled 2012-04-22 (×2): qty 100

## 2012-04-22 MED ORDER — ACETAMINOPHEN 10 MG/ML IV SOLN
INTRAVENOUS | Status: AC
  Administered 2012-04-22: 1000 mg via INTRAVENOUS
  Filled 2012-04-22: qty 100

## 2012-04-22 MED ORDER — SODIUM CHLORIDE 0.9 % IV SOLN: 250.0000 mL | INTRAVENOUS | Status: DC

## 2012-04-22 MED ORDER — HYDROMORPHONE HCL PF 1 MG/ML IJ SOLN
INTRAMUSCULAR | Status: DC | PRN
  Administered 2012-04-22 (×2): 0.5 mg via INTRAVENOUS

## 2012-04-22 MED ORDER — ARTIFICIAL TEARS OP OINT
TOPICAL_OINTMENT | OPHTHALMIC | Status: DC | PRN
  Administered 2012-04-22: 1 via OPHTHALMIC

## 2012-04-22 MED ORDER — LACTATED RINGERS IV SOLN
INTRAVENOUS | Status: DC | PRN
  Administered 2012-04-22 (×3): via INTRAVENOUS

## 2012-04-22 MED ORDER — HYDROMORPHONE HCL PF 1 MG/ML IJ SOLN
0.2500 mg | INTRAMUSCULAR | Status: DC | PRN
  Administered 2012-04-22 (×4): 0.5 mg via INTRAVENOUS

## 2012-04-22 MED ORDER — OXYCODONE-ACETAMINOPHEN 5-325 MG PO TABS
ORAL_TABLET | ORAL | Status: AC
  Filled 2012-04-22: qty 2

## 2012-04-22 MED ORDER — DOCUSATE SODIUM 100 MG PO CAPS
100.0000 mg | ORAL_CAPSULE | Freq: Two times a day (BID) | ORAL | Status: DC
  Administered 2012-04-22 – 2012-04-25 (×7): 100 mg via ORAL
  Filled 2012-04-22 (×5): qty 1

## 2012-04-22 MED ORDER — NEOSTIGMINE METHYLSULFATE 1 MG/ML IJ SOLN
INTRAMUSCULAR | Status: DC | PRN
  Administered 2012-04-22: 5 mg via INTRAVENOUS

## 2012-04-22 MED ORDER — ACETAMINOPHEN 650 MG RE SUPP: 650.0000 mg | RECTAL | Status: DC | PRN

## 2012-04-22 MED ORDER — 0.9 % SODIUM CHLORIDE (POUR BTL) OPTIME
TOPICAL | Status: DC | PRN
  Administered 2012-04-22 (×2): 1000 mL

## 2012-04-22 MED ORDER — ONDANSETRON HCL 4 MG/2ML IJ SOLN
4.0000 mg | INTRAMUSCULAR | Status: DC | PRN
  Administered 2012-04-24: 4 mg via INTRAVENOUS
  Filled 2012-04-22: qty 2

## 2012-04-22 MED ORDER — POTASSIUM CHLORIDE IN NACL 20-0.45 MEQ/L-% IV SOLN
INTRAVENOUS | Status: DC
  Administered 2012-04-22 – 2012-04-23 (×2): via INTRAVENOUS
  Filled 2012-04-22 (×4): qty 1000

## 2012-04-22 MED ORDER — HYDROMORPHONE HCL PF 1 MG/ML IJ SOLN
INTRAMUSCULAR | Status: AC
  Administered 2012-04-22: 1 mg
  Filled 2012-04-22: qty 1

## 2012-04-22 MED ORDER — BUPIVACAINE HCL (PF) 0.25 % IJ SOLN
INTRAMUSCULAR | Status: DC | PRN
  Administered 2012-04-22: 15 mL

## 2012-04-22 MED ORDER — ONDANSETRON HCL 4 MG/2ML IJ SOLN
INTRAMUSCULAR | Status: DC | PRN
  Administered 2012-04-22: 4 mg via INTRAVENOUS

## 2012-04-22 MED ORDER — SODIUM CHLORIDE 0.9 % IR SOLN
Status: DC | PRN
  Administered 2012-04-22: 08:00:00

## 2012-04-22 MED ORDER — LORATADINE 10 MG PO TABS
5.0000 mg | ORAL_TABLET | Freq: Every day | ORAL | Status: DC
  Filled 2012-04-22 (×2): qty 0.5

## 2012-04-22 MED ORDER — LIDOCAINE HCL (CARDIAC) 20 MG/ML IV SOLN
INTRAVENOUS | Status: DC | PRN
  Administered 2012-04-22: 100 mg via INTRAVENOUS

## 2012-04-22 MED ORDER — PHENOL 1.4 % MT LIQD
1.0000 | OROMUCOSAL | Status: DC | PRN
  Filled 2012-04-22: qty 177

## 2012-04-22 MED ORDER — VALACYCLOVIR HCL 500 MG PO TABS
1000.0000 mg | ORAL_TABLET | Freq: Every day | ORAL | Status: DC
  Administered 2012-04-22 – 2012-04-25 (×4): 1000 mg via ORAL
  Filled 2012-04-22 (×5): qty 2

## 2012-04-22 MED ORDER — LIDOCAINE-EPINEPHRINE 1 %-1:100000 IJ SOLN
INTRAMUSCULAR | Status: DC | PRN
  Administered 2012-04-22: 10 mL

## 2012-04-22 MED ORDER — HEMOSTATIC AGENTS (NO CHARGE) OPTIME
TOPICAL | Status: DC | PRN
  Administered 2012-04-22: 1 via TOPICAL

## 2012-04-22 MED ORDER — OXYCODONE-ACETAMINOPHEN 5-325 MG PO TABS
1.0000 | ORAL_TABLET | ORAL | Status: DC | PRN
  Administered 2012-04-22: 2 via ORAL

## 2012-04-22 MED ORDER — DEXAMETHASONE SODIUM PHOSPHATE 10 MG/ML IJ SOLN
10.0000 mg | INTRAMUSCULAR | Status: AC
  Administered 2012-04-22: 10 mg via INTRAVENOUS

## 2012-04-22 MED ORDER — ACETAMINOPHEN 10 MG/ML IV SOLN
INTRAVENOUS | Status: AC
  Filled 2012-04-22: qty 100

## 2012-04-22 MED ORDER — SODIUM CHLORIDE 0.9 % IV SOLN
INTRAVENOUS | Status: AC
  Filled 2012-04-22: qty 500

## 2012-04-22 MED ORDER — ALUM & MAG HYDROXIDE-SIMETH 200-200-20 MG/5ML PO SUSP: 30.0000 mL | Freq: Four times a day (QID) | ORAL | Status: DC | PRN

## 2012-04-22 MED ORDER — HYDROMORPHONE HCL PF 1 MG/ML IJ SOLN
0.5000 mg | INTRAMUSCULAR | Status: DC | PRN
  Administered 2012-04-22 – 2012-04-24 (×13): 1 mg via INTRAVENOUS
  Filled 2012-04-22 (×13): qty 1

## 2012-04-22 MED ORDER — SUCCINYLCHOLINE CHLORIDE 20 MG/ML IJ SOLN
INTRAMUSCULAR | Status: DC | PRN
  Administered 2012-04-22: 140 mg via INTRAVENOUS

## 2012-04-22 MED ORDER — ACETAMINOPHEN 10 MG/ML IV SOLN
1000.0000 mg | Freq: Four times a day (QID) | INTRAVENOUS | Status: AC
  Administered 2012-04-22 – 2012-04-23 (×3): 1000 mg via INTRAVENOUS
  Filled 2012-04-22 (×3): qty 100

## 2012-04-22 MED ORDER — CYCLOBENZAPRINE HCL 10 MG PO TABS
ORAL_TABLET | ORAL | Status: AC
  Filled 2012-04-22: qty 1

## 2012-04-22 MED ORDER — HYDROCODONE-ACETAMINOPHEN 7.5-325 MG PO TABS
1.0000 | ORAL_TABLET | ORAL | Status: DC | PRN
  Administered 2012-04-24 – 2012-04-26 (×7): 1 via ORAL
  Filled 2012-04-22 (×7): qty 1

## 2012-04-22 MED ORDER — EPHEDRINE SULFATE 50 MG/ML IJ SOLN
INTRAMUSCULAR | Status: DC | PRN
  Administered 2012-04-22 (×2): 10 mg via INTRAVENOUS
  Administered 2012-04-22: 5 mg via INTRAVENOUS

## 2012-04-22 MED ORDER — FENTANYL CITRATE 0.05 MG/ML IJ SOLN
INTRAMUSCULAR | Status: DC | PRN
  Administered 2012-04-22 (×3): 50 ug via INTRAVENOUS
  Administered 2012-04-22: 100 ug via INTRAVENOUS

## 2012-04-22 MED ORDER — HYDROMORPHONE HCL PF 1 MG/ML IJ SOLN
INTRAMUSCULAR | Status: AC
  Filled 2012-04-22: qty 1

## 2012-04-22 MED ORDER — PROMETHAZINE HCL 25 MG/ML IJ SOLN: 6.2500 mg | INTRAMUSCULAR | Status: DC | PRN

## 2012-04-22 MED ORDER — VITAMIN C 500 MG PO TABS
1000.0000 mg | ORAL_TABLET | Freq: Every day | ORAL | Status: DC
  Administered 2012-04-22 – 2012-04-25 (×4): 1000 mg via ORAL
  Filled 2012-04-22 (×5): qty 2

## 2012-04-22 MED ORDER — LORATADINE-PSEUDOEPHEDRINE ER 5-120 MG PO TB12
1.0000 | ORAL_TABLET | Freq: Two times a day (BID) | ORAL | Status: DC
  Filled 2012-04-22 (×3): qty 1

## 2012-04-22 MED ORDER — ROCURONIUM BROMIDE 100 MG/10ML IV SOLN
INTRAVENOUS | Status: DC | PRN
  Administered 2012-04-22: 10 mg via INTRAVENOUS
  Administered 2012-04-22 (×2): 20 mg via INTRAVENOUS
  Administered 2012-04-22: 10 mg via INTRAVENOUS
  Administered 2012-04-22: 5 mg via INTRAVENOUS
  Administered 2012-04-22: 30 mg via INTRAVENOUS
  Administered 2012-04-22: 5 mg via INTRAVENOUS
  Administered 2012-04-22: 20 mg via INTRAVENOUS
  Administered 2012-04-22: 5 mg via INTRAVENOUS

## 2012-04-22 MED ORDER — ACETAMINOPHEN 325 MG PO TABS: 650.0000 mg | ORAL_TABLET | ORAL | Status: DC | PRN

## 2012-04-22 MED ORDER — CEFAZOLIN SODIUM-DEXTROSE 2-3 GM-% IV SOLR
INTRAVENOUS | Status: AC
  Filled 2012-04-22: qty 50

## 2012-04-22 MED ORDER — PSEUDOEPHEDRINE HCL ER 120 MG PO TB12
120.0000 mg | ORAL_TABLET | Freq: Two times a day (BID) | ORAL | Status: DC
  Administered 2012-04-24: 120 mg via ORAL
  Filled 2012-04-22 (×9): qty 1

## 2012-04-22 MED ORDER — EZETIMIBE-SIMVASTATIN 10-20 MG PO TABS
1.0000 | ORAL_TABLET | Freq: Every day | ORAL | Status: DC
  Administered 2012-04-22 – 2012-04-25 (×4): 1 via ORAL
  Filled 2012-04-22 (×5): qty 1

## 2012-04-22 MED ORDER — MENTHOL 3 MG MT LOZG: 1.0000 | LOZENGE | OROMUCOSAL | Status: DC | PRN

## 2012-04-22 MED ORDER — CEFAZOLIN SODIUM 1-5 GM-% IV SOLN
1.0000 g | Freq: Three times a day (TID) | INTRAVENOUS | Status: DC
  Administered 2012-04-22 – 2012-04-24 (×5): 1 g via INTRAVENOUS
  Filled 2012-04-22 (×7): qty 50

## 2012-04-22 MED ORDER — ESTRADIOL 25 MCG VA TABS: 25.0000 ug | ORAL_TABLET | VAGINAL | Status: DC

## 2012-04-22 MED ORDER — SODIUM CHLORIDE 0.9 % IJ SOLN
3.0000 mL | Freq: Two times a day (BID) | INTRAMUSCULAR | Status: DC
  Administered 2012-04-23: 3 mL via INTRAVENOUS

## 2012-04-22 MED ORDER — THROMBIN 20000 UNITS EX SOLR
CUTANEOUS | Status: DC | PRN
  Administered 2012-04-22: 08:00:00 via TOPICAL

## 2012-04-22 MED ORDER — ADULT MULTIVITAMIN W/MINERALS CH
1.0000 | ORAL_TABLET | Freq: Every day | ORAL | Status: DC
  Administered 2012-04-22 – 2012-04-25 (×4): 1 via ORAL
  Filled 2012-04-22 (×5): qty 1

## 2012-04-22 MED ORDER — MIDAZOLAM HCL 5 MG/5ML IJ SOLN
INTRAMUSCULAR | Status: DC | PRN
  Administered 2012-04-22: 2 mg via INTRAVENOUS

## 2012-04-22 MED ORDER — CLOBETASOL PROPIONATE 0.05 % EX CREA
1.0000 "application " | TOPICAL_CREAM | Freq: Every day | CUTANEOUS | Status: DC
  Filled 2012-04-22: qty 15

## 2012-04-22 MED ORDER — GLYCOPYRROLATE 0.2 MG/ML IJ SOLN
INTRAMUSCULAR | Status: DC | PRN
  Administered 2012-04-22: .8 mg via INTRAVENOUS

## 2012-04-22 SURGICAL SUPPLY — 75 items
BAG DECANTER FOR FLEXI CONT (MISCELLANEOUS) ×2 IMPLANT
BENZOIN TINCTURE PRP APPL 2/3 (GAUZE/BANDAGES/DRESSINGS) ×2 IMPLANT
BLADE SURG 11 STRL SS (BLADE) ×2 IMPLANT
BLADE SURG ROTATE 9660 (MISCELLANEOUS) IMPLANT
BONE MATRIX OSTEOCEL PLUS 10CC (Bone Implant) ×2 IMPLANT
BRUSH SCRUB EZ PLAIN DRY (MISCELLANEOUS) ×2 IMPLANT
BUR MATCHSTICK NEURO 3.0 LAGG (BURR) ×2 IMPLANT
BUR PRECISION FLUTE 6.0 (BURR) ×2 IMPLANT
CANISTER SUCTION 2500CC (MISCELLANEOUS) ×2 IMPLANT
CAP LOCKING REVERE (Cap) ×14 IMPLANT
CLOTH BEACON ORANGE TIMEOUT ST (SAFETY) ×2 IMPLANT
CONNECTOR VAR CROSS 5.5X48-60 (Connector) ×2 IMPLANT
CONT SPEC 4OZ CLIKSEAL STRL BL (MISCELLANEOUS) ×6 IMPLANT
COVER BACK TABLE 24X17X13 BIG (DRAPES) IMPLANT
COVER TABLE BACK 60X90 (DRAPES) ×2 IMPLANT
DECANTER SPIKE VIAL GLASS SM (MISCELLANEOUS) ×2 IMPLANT
DERMABOND ADVANCED (GAUZE/BANDAGES/DRESSINGS) ×1
DERMABOND ADVANCED .7 DNX12 (GAUZE/BANDAGES/DRESSINGS) ×1 IMPLANT
DRAPE C-ARM 42X72 X-RAY (DRAPES) ×4 IMPLANT
DRAPE C-ARMOR (DRAPES) ×2 IMPLANT
DRAPE LAPAROTOMY 100X72X124 (DRAPES) ×2 IMPLANT
DRAPE POUCH INSTRU U-SHP 10X18 (DRAPES) ×2 IMPLANT
DRAPE PROXIMA HALF (DRAPES) ×2 IMPLANT
DRAPE SURG 17X23 STRL (DRAPES) ×2 IMPLANT
DRSG OPSITE 4X5.5 SM (GAUZE/BANDAGES/DRESSINGS) ×2 IMPLANT
DURAPREP 26ML APPLICATOR (WOUND CARE) ×2 IMPLANT
ELECT REM PT RETURN 9FT ADLT (ELECTROSURGICAL) ×2
ELECTRODE REM PT RTRN 9FT ADLT (ELECTROSURGICAL) ×1 IMPLANT
EVACUATOR 3/16  PVC DRAIN (DRAIN) ×1
EVACUATOR 3/16 PVC DRAIN (DRAIN) ×1 IMPLANT
GAUZE SPONGE 4X4 16PLY XRAY LF (GAUZE/BANDAGES/DRESSINGS) ×2 IMPLANT
GLOVE BIO SURGEON STRL SZ8 (GLOVE) ×4 IMPLANT
GLOVE BIOGEL PI IND STRL 6 (GLOVE) ×3 IMPLANT
GLOVE BIOGEL PI IND STRL 6.5 (GLOVE) ×2 IMPLANT
GLOVE BIOGEL PI IND STRL 8 (GLOVE) ×1 IMPLANT
GLOVE BIOGEL PI INDICATOR 6 (GLOVE) ×3
GLOVE BIOGEL PI INDICATOR 6.5 (GLOVE) ×2
GLOVE BIOGEL PI INDICATOR 8 (GLOVE) ×1
GLOVE ECLIPSE 7.5 STRL STRAW (GLOVE) ×4 IMPLANT
GLOVE EXAM NITRILE LRG STRL (GLOVE) ×4 IMPLANT
GLOVE EXAM NITRILE MD LF STRL (GLOVE) IMPLANT
GLOVE EXAM NITRILE XL STR (GLOVE) IMPLANT
GLOVE EXAM NITRILE XS STR PU (GLOVE) IMPLANT
GLOVE INDICATOR 7.0 STRL GRN (GLOVE) ×4 IMPLANT
GLOVE INDICATOR 7.5 STRL GRN (GLOVE) ×4 IMPLANT
GLOVE INDICATOR 8.5 STRL (GLOVE) ×4 IMPLANT
GOWN BRE IMP SLV AUR LG STRL (GOWN DISPOSABLE) IMPLANT
GOWN BRE IMP SLV AUR XL STRL (GOWN DISPOSABLE) ×6 IMPLANT
GOWN STRL REIN 2XL LVL4 (GOWN DISPOSABLE) IMPLANT
KIT BASIN OR (CUSTOM PROCEDURE TRAY) ×2 IMPLANT
KIT ROOM TURNOVER OR (KITS) ×2 IMPLANT
MILL MEDIUM DISP (BLADE) ×2 IMPLANT
NEEDLE HYPO 25X1 1.5 SAFETY (NEEDLE) ×2 IMPLANT
NS IRRIG 1000ML POUR BTL (IV SOLUTION) ×2 IMPLANT
PACK LAMINECTOMY NEURO (CUSTOM PROCEDURE TRAY) ×2 IMPLANT
PAD ARMBOARD 7.5X6 YLW CONV (MISCELLANEOUS) ×6 IMPLANT
PUTTY BONE DBX 5CC MIX (Putty) ×2 IMPLANT
ROD (Rod) ×2 IMPLANT
SCREW PEDICLE 6.5MMX35MM (Screw) ×4 IMPLANT
SCREW PEDICLE 6.5MMX45MM (Screw) ×10 IMPLANT
SCREW PEDICLE 6.5X45 (Screw) ×5 IMPLANT
SPACER CALIBER 10X22 9-13MM-12 (Spacer) ×4 IMPLANT
SPONGE GAUZE 4X4 12PLY (GAUZE/BANDAGES/DRESSINGS) ×2 IMPLANT
SPONGE LAP 4X18 X RAY DECT (DISPOSABLE) IMPLANT
SPONGE SURGIFOAM ABS GEL 100 (HEMOSTASIS) ×2 IMPLANT
STRIP CLOSURE SKIN 1/2X4 (GAUZE/BANDAGES/DRESSINGS) ×2 IMPLANT
SUT VIC AB 0 CT1 18XCR BRD8 (SUTURE) ×2 IMPLANT
SUT VIC AB 0 CT1 8-18 (SUTURE) ×2
SUT VIC AB 2-0 CT1 18 (SUTURE) ×4 IMPLANT
SUT VICRYL 4-0 PS2 18IN ABS (SUTURE) ×4 IMPLANT
SYR 20ML ECCENTRIC (SYRINGE) ×2 IMPLANT
TOWEL OR 17X24 6PK STRL BLUE (TOWEL DISPOSABLE) ×2 IMPLANT
TOWEL OR 17X26 10 PK STRL BLUE (TOWEL DISPOSABLE) ×2 IMPLANT
TRAY FOLEY CATH 14FRSI W/METER (CATHETERS) ×2 IMPLANT
WATER STERILE IRR 1000ML POUR (IV SOLUTION) ×2 IMPLANT

## 2012-04-22 NOTE — Anesthesia Postprocedure Evaluation (Signed)
  Anesthesia Post-op Note  Patient: Lauren Briggs  Procedure(s) Performed: Procedure(s) with comments: POSTERIOR LUMBAR FUSION 3 LEVEL (N/A) -  lumbar three to five sacral-one  POSTERIOR lateral orthrodesis  pedicle screw fixation  ;lumbar five sacral one decompression laminectomy  poterior interbody fusion  Patient Location: PACU  Anesthesia Type:General  Level of Consciousness: awake  Airway and Oxygen Therapy: Patient Spontanous Breathing  Post-op Pain: mild  Post-op Assessment: Post-op Vital signs reviewed, Patient's Cardiovascular Status Stable, Respiratory Function Stable, Patent Airway, No signs of Nausea or vomiting and Pain level controlled  Post-op Vital Signs: stable  Complications: No apparent anesthesia complications

## 2012-04-22 NOTE — Anesthesia Preprocedure Evaluation (Addendum)
Anesthesia Evaluation  Patient identified by MRN, date of birth, ID band Patient awake    Reviewed: Allergy & Precautions, H&P , NPO status , Patient's Chart, lab work & pertinent test results  Airway Mallampati: II TM Distance: >3 FB Neck ROM: Full    Dental  (+) Teeth Intact and Dental Advisory Given   Pulmonary  breath sounds clear to auscultation        Cardiovascular Rhythm:Regular Rate:Normal     Neuro/Psych    GI/Hepatic   Endo/Other    Renal/GU      Musculoskeletal   Abdominal (+) + obese,   Peds  Hematology   Anesthesia Other Findings   Reproductive/Obstetrics                          Anesthesia Physical Anesthesia Plan  ASA: II  Anesthesia Plan: General   Post-op Pain Management:    Induction: Intravenous  Airway Management Planned: Oral ETT and Video Laryngoscope Planned  Additional Equipment:   Intra-op Plan:   Post-operative Plan: Extubation in OR  Informed Consent: I have reviewed the patients History and Physical, chart, labs and discussed the procedure including the risks, benefits and alternatives for the proposed anesthesia with the patient or authorized representative who has indicated his/her understanding and acceptance.     Plan Discussed with: CRNA and Surgeon  Anesthesia Plan Comments:        Anesthesia Quick Evaluation

## 2012-04-22 NOTE — H&P (Signed)
Lauren Briggs is an 57 y.o. female.   Chief Complaint: Back and right leg pain HPI: Patient has a long history of back and leg pain, uinderwent a XLIF form L3-L5 2 years ago with good success until recently when she has developed right leg pain to her heel.   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  Patient has been refractory to all forms of conservative treatment and I have recommended posterolateral fusion L3-L5 and PLIF at L5-S1. i extensively went over the risks and benefits, perioperative course , expectations of outcome and alternatives to surgery.  She understood and agreed to proceed.  Past Medical History  Diagnosis Date  . Low back pain radiating to right leg   . Hyperlipidemia   . Complication of anesthesia     HX Back CHIPPED TOOTH S/P EXTUBATION     Past Surgical History  Procedure Laterality Date  . Back surgery      X2    . Nasal sinus surgery      1994   . Appendectomy    . Carpal tunnel release      BIL   . Closed reduction ulnar shaft    . Cesarean section      PARTIAL HYST  . Abdominal hysterectomy      PARTIAL  . Foot surgery      LT FOOT CYST REMOVED     History reviewed. No pertinent family history. Social History:  reports that she has quit smoking. Her smoking use included Cigarettes. She smoked 0.00 packs per day. She quit smokeless tobacco use about 33 years ago. She reports that she does not drink alcohol or use illicit drugs.  Allergies:  Allergies  Allergen Reactions  . Propoxyphene Hcl     REACTION: heart races    Medications Prior to Admission  Medication Sig Dispense Refill  . Ascorbic Acid (VITAMIN C) 1000 MG tablet Take 1,000 mg by mouth daily.      Marland Kitchen CINNAMON PO Take 1 capsule by mouth 2 (two) times daily.      . clobetasol cream (TEMOVATE) 0.05 % Apply 1 application topically daily.      . Cranberry 600 MG TABS Take 600 mg by mouth daily.      Marland Kitchen estradiol (ESTRACE) 0.5 MG tablet Take 0.5 mg  by mouth daily.      Marland Kitchen estradiol (VAGIFEM) 25 MCG vaginal tablet Place 25 mcg vaginally 3 (three) times a week.      . ezetimibe-simvastatin (VYTORIN) 10-20 MG per tablet Take 1 tablet by mouth at bedtime.      Marland Kitchen GRAPE SEED EXTRACT PO Take 1 tablet by mouth daily.      Marland Kitchen HYDROcodone-acetaminophen (NORCO) 7.5-325 MG per tablet Take 1 tablet by mouth every 6 (six) hours as needed for pain.      . Multiple Vitamin (MULTIVITAMIN WITH MINERALS) TABS Take 1 tablet by mouth daily.      . Omega-3 Fatty Acids (FISH OIL) 1200 MG CAPS Take 3,600 mg by mouth daily.      . valACYclovir (VALTREX) 1000 MG tablet Take 1,000 mg by mouth daily.      Marland Kitchen loratadine-pseudoephedrine (CLARITIN-D 12-HOUR) 5-120 MG per tablet Take 1 tablet by mouth 2 (two) times daily.        Results for orders placed during the hospital encounter of 04/20/12 (from the past 48 hour(s))  SURGICAL PCR SCREEN     Status: None   Collection Time  04/20/12  3:44 PM      Result Value Range   MRSA, PCR NEGATIVE  NEGATIVE   Staphylococcus aureus NEGATIVE  NEGATIVE   Comment:            The Xpert SA Assay (FDA     approved for NASAL specimens     in patients over 22 years of age),     is one component of     a comprehensive surveillance     program.  Test performance has     been validated by [employer] for patients greater     than or equal to 38 year old.     It is not intended     to diagnose infection nor to     guide or monitor treatment.  CBC     Status: None   Collection Time    04/20/12  3:51 PM      Result Value Range   WBC 6.3  4.0 - 10.5 K/uL   RBC 4.36  3.87 - 5.11 MIL/uL   Hemoglobin 13.5  12.0 - 15.0 g/dL   HCT 13.0  86.5 - 78.4 %   MCV 90.8  78.0 - 100.0 fL   MCH 31.0  26.0 - 34.0 pg   MCHC 34.1  30.0 - 36.0 g/dL   RDW 69.6  29.5 - 28.4 %   Platelets 262  150 - 400 K/uL  BASIC METABOLIC PANEL     Status: Abnormal   Collection Time    04/20/12  3:51 PM      Result Value Range   Sodium 139  135 - 145  mEq/L   Potassium 4.1  3.5 - 5.1 mEq/L   Chloride 101  96 - 112 mEq/L   CO2 28  19 - 32 mEq/L   Glucose, Bld 110 (*) 70 - 99 mg/dL   BUN 19  6 - 23 mg/dL   Creatinine, Ser 1.32  0.50 - 1.10 mg/dL   Calcium 9.7  8.4 - 44.0 mg/dL   GFR calc non Af Amer >90  >90 mL/min   GFR calc Af Amer >90  >90 mL/min   Comment:            The eGFR has been calculated     using the CKD EPI equation.     This calculation has not been     validated in all clinical     situations.     eGFR's persistently     <90 mL/min signify     possible Chronic Kidney Disease.  TYPE AND SCREEN     Status: None   Collection Time    04/20/12  3:52 PM      Result Value Range   ABO/RH(D) O POS     Antibody Screen NEG     Sample Expiration 04/23/2012     Dg Chest 2 View  04/20/2012  *RADIOLOGY REPORT*  Clinical Data: Preop back surgery  CHEST - 2 VIEW  Comparison: 09/26/2009  Findings: Heart size is upper normal.  Negative for heart failure. Lungs are clear without infiltrate or effusion.  IMPRESSION: No active cardiopulmonary abnormality.   Original Report Authenticated By: Janeece Riggers, M.D.     Review of Systems  Constitutional: Negative.   HENT: Negative.   Eyes: Negative.   Respiratory: Negative.   Cardiovascular: Negative.   Gastrointestinal: Negative.   Genitourinary: Negative.   Musculoskeletal: Positive for myalgias and back pain.  Skin: Negative.  Neurological: Negative.   Endo/Heme/Allergies: Negative.   Psychiatric/Behavioral: Negative.     Blood pressure 142/80, pulse 80, temperature 97.3 F (36.3 C), temperature source Oral, resp. rate 20, SpO2 97.00%. Physical Exam  Constitutional: She is oriented to person, place, and time. She appears well-developed.  HENT:  Head: Normocephalic.  Eyes: Pupils are equal, round, and reactive to light.  Neck: Normal range of motion.  Cardiovascular: Normal rate.   Respiratory: Effort normal.  GI: Soft.  Musculoskeletal: Normal range of motion.   Neurological: She is alert and oriented to person, place, and time. She has normal strength. GCS eye subscore is 4. GCS verbal subscore is 5. GCS motor subscore is 6.  Reflex Scores:      Brachioradialis reflexes are 0 on the right side and 0 on the left side.      Patellar reflexes are 0 on the right side and 0 on the left side.      Achilles reflexes are 0 on the right side and 0 on the left side. Strength is 5/5, IP, Q H, GA, AT, EHL     Assessment/Plan Plif L5-S1 and posterolateral L3-L5 with pedicle screws.  Belma Dyches P 04/22/2012, 8:27 AM

## 2012-04-22 NOTE — Op Note (Signed)
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  Postoperative diagnosis: Same  Procedure: Decompressive lumbar laminectomy L5-S1 and excess and requiring more work than standard decompression associated with posterior lumbar interbody fusion with complete facetectomies and a radical foraminotomies of the L5 and S1 nerve roots bilaterally with resection of right-sided synovial cyst  #2 exploration of fusion and removal of interspinous plate Z6-1 and exploration of fusion L3-4  posterior lumbar interbody fusion L5-S1 using a caliper expandable peek cages packed with local autograft mixed DBX  3 pedicle screw fixation L3-S1 on the right was 6 5 x 45 screws inserted at L3-L5 and 6 x 35 at S1 and on the left 6 5 x 45 screws inserted L3, L4, L5, with 6 x 35 screws inserted at S1 left the using the globus Revere 5.5 pedicle screw system  #4 posterior lateral arthrodesis L3-S1 using local autograft mixed with DBX and osteocel  And 5 placement of a large Hemovac drain  Surgeon: Jillyn Hidden Maven Varelas  Assistant: Shirlean Kelly  Anesthesia: Gen.  EBL: Minimal  History of present illness: Patient is a very pleasant 57 year old female has had long-standing back pain she underwent a two-level anterior lateral interbody fusion at L3-4 and L4-5 in the very well initially however has had progressive worsening back pain and pain into both legs left worse the right Patient developed S1 radiculopathy which initially on MRI scan a year ago showed a small spur and disc bulge at that level however on followup MRI scan show significant progression with a large spur/synovial cyst causing severe displacement of the L5-S1 nerve roots and outside the clivus and her failure conservative treatment imaging epidural steroid injections physical therapy and  time she was recommended decompression stabilization procedure as well as her imaging showed a pseudoarthrosis at L3-4 and L5 she was recommended reexploration of fusion removal of hardware and redo posterior lateral fusion L3-S1.  Operative procedure: Patient brought into the or was induced under general anesthesia and positioned prone the Wilson frame her back was prepped and draped in routine sterile fashion. Her incision was opened up and extended somewhat caudally subperiosteal dissections care to the scar tissue exposing the L5-S1 lamina facet complex laminar complexes at L3-4 and L4-5 were also exposed as well as the interspinous plate at W9-6 and L4-5 TPs were also exposed at L3, L4, L5, and S1. After interoperative x-ray confirmed identification of the S1 pedicle the spinous process plate at E4-5 was then removed fusion L4-5 and did appear to be solid although radiographically there appeared to be a pseudoarthrosis at L3-4 after removal of the interspinous plate at W0-9 the L5 spinous process was removed central decompression was begun complete medial facetectomies were performed and radical foraminotomies were carried out on the L5 and S1 nerve roots bilaterally. After extensive facetectomies tracking down complete removal of the pars and then the complete facet complex to gain adequate decompression of the undersurface of the 5 root medially visualized was a densely adherent appear to be due to degenerative synovial cyst to the dura this was teased off the dura with a dental dissection tool and suture rongeurs and a 4 Penfield and up after removal the S1 nerve root was completely decompressed flush with pedicle adequate exposure to the lateral aspect of the disc space also was achieved and aggressive abutting super to cutting  facet complex to after all the decompression been completed L5-S1 and both L5 and S1 nerve roots were widely decompressed and taken the interbody work to space was incised and  cleanout suture rongeurs bilaterally a size 10 and stepping up to size 11 distractor was inserted and is felt to be adequate distraction of the space and good apposition the endplates so with a 9 x 22 mm expandable cage was selected to expand up to approximately 11-12 mm. Endplate preparation was carried out with a rotating cutters Epstein curettes and pituitary rongeurs. After adequate compression was achieved and 9 cages packed inserted and expanded approximately 6 turns up to about 12 mm of the space. The distractor was removed the spaces cleanout of the outside local autograft was packed centrally and right-sided cages and subsequent certain similar fashion fluoroscopy used the step along the way after this the wound was copiously irrigated on the TPs were all identified and using commonly simple AP and lateral fluoroscopy pilot holes were drilled at L3-4 and 5 on the second L3 and 4 the left L3 and the right due to the placement of the L3-4 spinous process plate with the screw apparatus taking towards the passenger side pedicle screw I elected to leave this screw out of L4 and the right. After the AP pilot holes were achieved and lateral fluoroscopy all pedicles were cannulated probed About the Probed again and the appropriate screws are placed each level was 6 5 x 45 at L3 bilaterally 6 5 x 45 L4 the left 6 5 x 45 at L5 bilaterally and 06/27/1933 S1 bilaterally after all screws in place stents taken the aggregate aggressive copious irrigation meticulous hemostasis and aggressive decortication was care MTPs or lateral gutters the remainder the autograft mixed with his pack posterior laterally from L3 down to S1 then the rods were then sized cut and selected and cut and top tightness tightened at S1 the L5 screws compressed against S1 and the L3-L4 screws were tightened in situ. Crossing was applied all foraminal reinspected confirm patency who and a drain was placed the wounds closed in layers with after Vicryl  with a running 4 subcuticular in the skin benzoin Steri-Strips applied patient recovered in stable condition at the end of case on it counts and sponge counts were correct.Marland Kitchen

## 2012-04-22 NOTE — Anesthesia Procedure Notes (Addendum)
Procedure Name: Intubation Date/Time: 04/22/2012 8:44 AM Performed by: Jefm Miles E Pre-anesthesia Checklist: Patient identified, Timeout performed, Emergency Drugs available, Suction available and Patient being monitored Patient Re-evaluated:Patient Re-evaluated prior to inductionOxygen Delivery Method: Circle system utilized Preoxygenation: Pre-oxygenation with 100% oxygen Intubation Type: IV induction and Rapid sequence Ventilation: Mask ventilation without difficulty Laryngoscope Size: Mac Grade View: Grade I Tube type: Oral Tube size: 7.0 mm Number of attempts: 1 Airway Equipment and Method: Stylet and Video-laryngoscopy Placement Confirmation: ETT inserted through vocal cords under direct vision,  breath sounds checked- equal and bilateral and positive ETCO2 Secured at: 21 cm Tube secured with: Tape Dental Injury: Teeth and Oropharynx as per pre-operative assessment  Comments: Elective use of Glidescope for previous history of chipped tooth during extubation and anterior airway

## 2012-04-22 NOTE — Preoperative (Signed)
Beta Blockers   Reason not to administer Beta Blockers:Not Applicable 

## 2012-04-22 NOTE — Transfer of Care (Signed)
Immediate Anesthesia Transfer of Care Note  Patient: DAVA RENSCH  Procedure(s) Performed: Procedure(s) with comments: POSTERIOR LUMBAR FUSION 3 LEVEL (N/A) -  lumbar three to five sacral-one  POSTERIOR lateral orthrodesis  pedicle screw fixation  ;lumbar five sacral one decompression laminectomy  poterior interbody fusion  Patient Location: PACU  Anesthesia Type:General  Level of Consciousness: awake, alert , oriented and patient cooperative  Airway & Oxygen Therapy: Patient Spontanous Breathing and Patient connected to nasal cannula oxygen  Post-op Assessment: Report given to PACU RN, Post -op Vital signs reviewed and stable and Patient moving all extremities X 4  Post vital signs: Reviewed and stable  Complications: No apparent anesthesia complications

## 2012-04-23 MED ORDER — PSEUDOEPHEDRINE HCL ER 120 MG PO TB12
120.0000 mg | ORAL_TABLET | Freq: Two times a day (BID) | ORAL | Status: DC
  Administered 2012-04-23 – 2012-04-25 (×4): 120 mg via ORAL
  Filled 2012-04-23 (×8): qty 1

## 2012-04-23 MED ORDER — LORATADINE 10 MG PO TABS
5.0000 mg | ORAL_TABLET | Freq: Two times a day (BID) | ORAL | Status: DC
  Administered 2012-04-23 – 2012-04-24 (×3): 5 mg via ORAL
  Administered 2012-04-25: 10:00:00 via ORAL
  Administered 2012-04-25: 5 mg via ORAL
  Filled 2012-04-23 (×8): qty 0.5

## 2012-04-23 MED ORDER — OXYCODONE HCL 5 MG PO TABS
5.0000 mg | ORAL_TABLET | ORAL | Status: DC | PRN
  Administered 2012-04-23 (×2): 10 mg via ORAL
  Filled 2012-04-23 (×2): qty 2

## 2012-04-23 NOTE — Progress Notes (Signed)
Subjective: Patient reports She is overall doing very well complete resolution of her preoperative radicular symptoms no new numbness or tingling back pain seems to be well-controlled with the oral pain medication intermittent a lot of nightly Tylenol.  Objective: Vital signs in last 24 hours: Temp:  [97.3 F (36.3 C)-98.7 F (37.1 C)] 97.3 F (36.3 C) (04/03 0520) Pulse Rate:  [73-97] 97 (04/03 0520) Resp:  [10-22] 18 (04/03 0520) BP: (114-148)/(46-85) 115/48 mmHg (04/03 0520) SpO2:  [96 %-100 %] 98 % (04/03 0520) FiO2 (%):  [3 %] 3 % (04/02 1818) Weight:  [97.977 kg (216 lb)] 97.977 kg (216 lb) (04/02 1818)  Intake/Output from previous day: 04/02 0701 - 04/03 0700 In: 2500 [I.V.:2500] Out: 3905 [Urine:3375; Drains:280; Blood:250] Intake/Output this shift: Total I/O In: 150 [Other:150] Out: -   strength out of 5 wound clean dry and  Lab Results:  Recent Labs  04/20/12 1551  WBC 6.3  HGB 13.5  HCT 39.6  PLT 262   BMET  Recent Labs  04/20/12 1551  NA 139  K 4.1  CL 101  CO2 28  GLUCOSE 110*  BUN 19  CREATININE 0.59  CALCIUM 9.7    Studies/Results: Dg Lumbar Spine 2-3 Views  04/22/2012  *RADIOLOGY REPORT*  Clinical Data: Back pain  LUMBAR SPINE - 2-3 VIEW,DG C-ARM 61-120 MIN  Comparison: Lumbar CT 11/13/2011.  MRI lumbar spine 04/17/2012.  Findings: The interspinous fixation plates have been removed and replaced with pedicle screws extending from L3-S1 bilaterally. Interbody cages are present at L3-4 L4-5.  New interbody device placed at L5-S1.  IMPRESSION: As above.   Original Report Authenticated By: Davonna Belling, M.D.    Dg C-arm [phone] Min  04/22/2012  *RADIOLOGY REPORT*  Clinical Data: Back pain  LUMBAR SPINE - 2-3 VIEW,DG C-ARM 61-120 MIN  Comparison: Lumbar CT 11/13/2011.  MRI lumbar spine 04/17/2012.  Findings: The interspinous fixation plates have been removed and replaced with pedicle screws extending from L3-S1 bilaterally. Interbody cages are present at  L3-4 L4-5.  New interbody device placed at L5-S1.  IMPRESSION: As above.   Original Report Authenticated By: Davonna Belling, M.D.     Assessment/Plan: Progressive mobilization today with physical and occupational therapy in her brace we'll DC her Foley and advance her diet and ambulate.  LOS: 1 day     Lauren Briggs P 04/23/2012, 8:17 AM

## 2012-04-23 NOTE — Progress Notes (Signed)
Utilization review completed. Jaymz Traywick, RN, BSN. 

## 2012-04-23 NOTE — Evaluation (Signed)
Physical Therapy Evaluation Patient Details Name: Lauren Briggs MRN: [id number] DOB: [birth year] Today's Date: 04/23/2012 Time: 4098-1191 PT Time Calculation (min): 23 min  PT Assessment / Plan / Recommendation Clinical Impression  57 y.o. admitted for her 3rd back surgery, consisting of Decompressive lumbar laminectomy L5-S1 and excess and requiring more work than standard decompression associated with posterior lumbar interbody fusion with complete facetectomies and a radical foraminotomies of the L5 and S1 nerve roots bilaterally with resection of right-sided synovial cyst. Pt mobilizing well, she ambulated 150' pushing IV pole. Will plan to do stair training tomorrow. She would benefit from acute PT to maximize safety and independence with moiblity.     PT Assessment  Patient needs continued PT services    Follow Up Recommendations  No PT follow up    Does the patient have the potential to tolerate intense rehabilitation      Barriers to Discharge None      Equipment Recommendations  None recommended by PT    Recommendations for Other Services OT consult   Frequency Min 6X/week    Precautions / Restrictions Precautions Precautions: Back Precaution Comments: Pt very familiar with back precautions and techniques.  Has had many back surgeries and years of PT for her back. Required Braces or Orthoses: Spinal Brace Spinal Brace: Lumbar corset;Applied in sitting position Restrictions Weight Bearing Restrictions: No   Pertinent Vitals/Pain *3/10 at rest, 6/10 with walking -incisional pain premedicated**      Mobility  Bed Mobility Bed Mobility: Rolling Right;Right Sidelying to Sit;Sitting - Scoot to [place] of Bed;Sit to Supine Rolling Right: 5: Supervision Right Sidelying to Sit: 4: Min assist Sitting - Scoot to Edge of Bed: 5: Supervision Sit to Supine: 6: Modified independent (Device/Increase time);With rail;HOB elevated Details for Bed Mobility Assistance: Pt does well.   REcalls all precautions and is safe. Transfers Sit to Stand: 5: Supervision;From chair/3-in-1;With armrests;With upper extremity assist Stand to Sit: 5: Supervision;With armrests;To bed Details for Transfer Assistance: Pt does well and has good hand placement Ambulation/Gait Ambulation/Gait Assistance: 6: Modified independent (Device/Increase time) Ambulation Distance (Feet): 150 Feet Assistive device: Other (Comment) (holding IV pole) Gait Pattern: Within Functional Limits Gait velocity: WFL General Gait Details: safe, steady    Exercises     PT Diagnosis: Acute pain  PT Problem List: Decreased activity tolerance;Decreased mobility;Pain PT Treatment Interventions: Gait training;Stair training;Functional mobility training   PT Goals Acute Rehab PT Goals PT Goal Formulation: With patient Time For Goal Achievement: 04/30/12 Potential to Achieve Goals: Good Pt will Ambulate: >150 feet;Independently PT Goal: Ambulate - Progress: Goal set today Pt will Go Up / Down Stairs: 6-9 stairs;with modified independence;with rail(s) PT Goal: Up/Down Stairs - Progress: Goal set today  Visit Information  Last PT Received On: 04/23/12 Assistance Needed: +1    Subjective Data  Subjective: " I walk better without a walker, I push the IV pole." 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   Prior Functioning  Home Living Lives With: Spouse Available Help at Discharge: Family;Available 24 hours/day Type of Home: House Home Access: Stairs to enter [employer] of Steps: 5 Entrance Stairs-Rails: Can reach both Home Layout: Two level;Able to live on main level with bedroom/bathroom Alternate Level Stairs-Number of Steps: 14 Alternate Level Stairs-Rails: Left Bathroom Shower/Tub: Tub/shower unit;Curtain Firefighter: Standard Home Adaptive Equipment: Walker - rolling;Reacher;Shower chair without back Prior Function Level of Independence: Independent Able to Take  Stairs?: Yes Driving: Yes Vocation: Part time employment Comments: paralegal/accounting part time  Communication Communication: No difficulties Dominant Hand: Right    Cognition  Cognition Overall Cognitive Status: Appears within functional limits for tasks assessed/performed Arousal/Alertness: Awake/alert Orientation Level: Oriented X4 / Intact Behavior During Session: [facility] for tasks performed    Extremity/Trunk Assessment Right Upper Extremity Assessment RUE ROM/Strength/Tone: Within functional levels RUE Sensation: WFL - Light Touch RUE Coordination: WFL - gross/fine motor Left Upper Extremity Assessment LUE ROM/Strength/Tone: Within functional levels LUE Sensation: WFL - Light Touch LUE Coordination: WFL - gross/fine motor Right Lower Extremity Assessment RLE ROM/Strength/Tone: Within functional levels RLE Sensation: WFL - Light Touch RLE Coordination: WFL - gross/fine motor Left Lower Extremity Assessment LLE ROM/Strength/Tone: Within functional levels LLE Sensation: WFL - Light Touch LLE Coordination: WFL - gross/fine motor Trunk Assessment Trunk Assessment: Normal   Balance Balance Balance Assessed: No  End of Session PT - End of Session Equipment Utilized During Treatment: Back brace Activity Tolerance: Patient tolerated treatment well Patient left: in bed;with call bell/phone within reach Nurse Communication: Mobility status  GP     Ralene Bathe Kistler 04/23/2012, 10:59 AM [phone]

## 2012-04-23 NOTE — Evaluation (Signed)
Occupational Therapy Evaluation Patient Details Name: Lauren Briggs MRN: [id number] DOB: 1955-05-20 Today's Date: 04/23/2012 Time: 6063-0160 OT Time Calculation (min): 45 min  OT Assessment / Plan / Recommendation Clinical Impression  Pt is a 57 yo female admitted for L3-S1 PLIF who has the deficits listed below although doing very well with adls.  Pt would benefit from one or two more OT sessions to address tub transfers and LE dressing.      OT Assessment  Patient needs continued OT Services    Follow Up Recommendations  No OT follow up    Barriers to Discharge None    Equipment Recommendations  None recommended by OT    Recommendations for Other Services    Frequency  Min 2X/week    Precautions / Restrictions Precautions Precautions: Back Precaution Comments: Pt very familiar with back precautions and techniques.  Has had many back surgeries and years of PT for her back. Required Braces or Orthoses: Spinal Brace Spinal Brace: Lumbar corset;Applied in sitting position Restrictions Weight Bearing Restrictions: No   Pertinent Vitals/Pain Pt with 4/10 pain.    ADL  Eating/Feeding: Performed;Independent Where Assessed - Eating/Feeding: Chair Grooming: Performed;Wash/dry hands;Wash/dry face;Supervision/safety Where Assessed - Grooming: Supported standing Upper Body Bathing: Simulated;Set up Where Assessed - Upper Body Bathing: Supported sitting Lower Body Bathing: Performed;Moderate assistance Where Assessed - Lower Body Bathing: Supported sit to stand Upper Body Dressing: Performed;Set up Where Assessed - Upper Body Dressing: Supported sitting Lower Body Dressing: Performed;Moderate assistance Where Assessed - Lower Body Dressing: Supported sit to stand Toilet Transfer: Performed;Min Pension scheme manager Method: Sit to stand;Stand pivot Acupuncturist: Raised toilet seat with arms (or 3-in-1 over toilet) Toileting - Clothing Manipulation and Hygiene:  Performed;Min guard Where Assessed - Glass blower/designer Manipulation and Hygiene: Standing Equipment Used: Back brace Transfers/Ambulation Related to ADLs: Pt walked into hall pushing IV with S. ADL Comments: More assist needed with LE adls. pt aware of all equipment.  Pt has reacher at home and does not want sock aid.  Husband there to assist for 2 weeks.    OT Diagnosis: Generalized weakness  OT Problem List: Decreased knowledge of use of DME or AE;Pain OT Treatment Interventions: Self-care/ADL training;DME and/or AE instruction   OT Goals Acute Rehab OT Goals OT Goal Formulation: With patient Time For Goal Achievement: 04/30/12 Potential to Achieve Goals: Good ADL Goals Pt Will Perform Lower Body Dressing: with min assist;Sit to stand from chair ADL Goal: Lower Body Dressing - Progress: Goal set today Pt Will Perform Tub/Shower Transfer: with supervision;Tub transfer;Shower seat without back ADL Goal: Web designer - Progress: Goal set today Additional ADL Goal #1: Pt will perform toileting on regular height commode with grab bar to simulate home set up with mod I. ADL Goal: Additional Goal #1 - Progress: Goal set today  Visit Information  Last OT Received On: 04/23/12 Assistance Needed: +1    Subjective Data  Subjective: "I feel good today." Patient Stated Goal: to go home.   Prior Functioning     Home Living Lives With: Spouse Available Help at Discharge: Family;Available 24 hours/day Type of Home: House Home Access: Stairs to enter [employer] of Steps: 5 Entrance Stairs-Rails: Can reach both Home Layout: Two level;Able to live on main level with bedroom/bathroom Alternate Level Stairs-Number of Steps: 14 Alternate Level Stairs-Rails: Left Bathroom Shower/Tub: Tub/shower unit;Curtain Firefighter: Standard Home Adaptive Equipment: Walker - rolling;Reacher;Shower chair without back Prior Function Level of Independence: Independent Able to  Take Stairs?: Yes  Driving: Yes Vocation: Part time employment Comments: Conservator, museum/gallery for lawyers Communication Communication: No difficulties Dominant Hand: Right         Vision/Perception Vision - History Baseline Vision: No visual deficits Patient Visual Report: No change from baseline Vision - Assessment Vision Assessment: Vision not tested   Cognition  Cognition Overall Cognitive Status: Appears within functional limits for tasks assessed/performed Arousal/Alertness: Awake/alert Orientation Level: Oriented X4 / Intact Behavior During Session: [facility] for tasks performed    Extremity/Trunk Assessment Right Upper Extremity Assessment RUE ROM/Strength/Tone: Within functional levels RUE Sensation: WFL - Light Touch RUE Coordination: WFL - gross/fine motor Left Upper Extremity Assessment LUE ROM/Strength/Tone: Within functional levels LUE Sensation: WFL - Light Touch LUE Coordination: WFL - gross/fine motor Trunk Assessment Trunk Assessment: Normal     Mobility Bed Mobility Bed Mobility: Rolling Right;Right Sidelying to Sit;Sitting - Scoot to Edge of Bed Rolling Right: 5: Supervision Right Sidelying to Sit: 4: Min assist Sitting - Scoot to Edge of Bed: 5: Supervision Details for Bed Mobility Assistance: Pt does well.  REcalls all precautions and is safe. Transfers Transfers: Sit to Stand;Stand to Sit Sit to Stand: 4: Min guard;From bed Stand to Sit: 5: Supervision;With armrests;To chair/3-in-1 Details for Transfer Assistance: Pt does well and has good hand placement     Exercise     Balance Balance Balance Assessed: No   End of Session OT - End of Session Equipment Utilized During Treatment: Back brace Activity Tolerance: Patient tolerated treatment well Patient left: in chair;with call bell/phone within reach Nurse Communication: Mobility status  GO     Lauren Briggs 04/23/2012, 9:47 AM [phone]

## 2012-04-24 MED ORDER — DEXAMETHASONE 6 MG PO TABS
10.0000 mg | ORAL_TABLET | Freq: Four times a day (QID) | ORAL | Status: DC
  Administered 2012-04-25 – 2012-04-26 (×6): 10 mg via ORAL
  Filled 2012-04-24 (×11): qty 1

## 2012-04-24 MED ORDER — POLYETHYLENE GLYCOL 3350 17 G PO PACK
17.0000 g | PACK | Freq: Every day | ORAL | Status: DC | PRN
  Administered 2012-04-24 – 2012-04-25 (×2): 17 g via ORAL
  Filled 2012-04-24: qty 1

## 2012-04-24 MED ORDER — HYDROMORPHONE HCL 2 MG PO TABS
4.0000 mg | ORAL_TABLET | ORAL | Status: DC | PRN
  Administered 2012-04-24 – 2012-04-26 (×9): 4 mg via ORAL
  Filled 2012-04-24 (×10): qty 2

## 2012-04-24 MED ORDER — HYDROMORPHONE HCL 4 MG PO TABS: 4.0000 mg | ORAL_TABLET | ORAL | Status: DC | PRN

## 2012-04-24 MED ORDER — DEXAMETHASONE SODIUM PHOSPHATE 10 MG/ML IJ SOLN
10.0000 mg | Freq: Four times a day (QID) | INTRAMUSCULAR | Status: DC
  Filled 2012-04-24 (×3): qty 1

## 2012-04-24 MED ORDER — CYCLOBENZAPRINE HCL 10 MG PO TABS: 10.0000 mg | ORAL_TABLET | Freq: Three times a day (TID) | ORAL | Status: DC | PRN

## 2012-04-24 MED ORDER — CIPROFLOXACIN HCL 500 MG PO TABS
500.0000 mg | ORAL_TABLET | Freq: Two times a day (BID) | ORAL | Status: DC
  Administered 2012-04-24 – 2012-04-26 (×5): 500 mg via ORAL
  Filled 2012-04-24 (×7): qty 1

## 2012-04-24 MED FILL — Sodium Chloride IV Soln 0.9%: INTRAVENOUS | Qty: 1000 | Status: AC

## 2012-04-24 MED FILL — Sodium Chloride Irrigation Soln 0.9%: Qty: 3000 | Status: AC

## 2012-04-24 MED FILL — Heparin Sodium (Porcine) Inj 1000 Unit/ML: INTRAMUSCULAR | Qty: 30 | Status: AC

## 2012-04-24 NOTE — Progress Notes (Signed)
Subjective: Patient reports She's doing okay she had lower pain yesterday evening and this morning it's localized to the back but also a little bit of the left hip and down the outside left thigh above the knee. She denies any right leg radiculopathy said is a lot of work to pay me oxycodone really isn't doing anything for her. She is passing gas  Objective: Vital signs in last 24 hours: Temp:  [97.7 F (36.5 C)-98.6 F (37 C)] 98.5 F (36.9 C) (04/04 1411) Pulse Rate:  [83-91] 91 (04/04 1411) Resp:  [18-20] 20 (04/04 1411) BP: (117-125)/(51-68) 120/54 mmHg (04/04 1411) SpO2:  [98 %-100 %] 98 % (04/04 1411)  Intake/Output from previous day: 04/03 0701 - 04/04 0700 In: 1280 [P.O.:1120] Out: 310 [Drains:310] Intake/Output this shift: Total I/O In: 480 [P.O.:480] Out: 100 [Drains:100]  Strength out of 5 wound clean and dry a lot of tenderness around her left greater trochanteric bursa  Lab Results: No results found for this basename: WBC, HGB, HCT, PLT,  in the last 72 hours BMET No results found for this basename: NA, K, CL, CO2, GLUCOSE, BUN, CREATININE, CALCIUM,  in the last 72 hours  Studies/Results: No results found.  Assessment/Plan: Chisel bursitis of left leg they were also mobilizing her and I will see she does with her pain over the next 24 hours but will discharge her to or Sunday  LOS: 2 days     Lauren Briggs P 04/24/2012, 5:44 PM

## 2012-04-24 NOTE — Progress Notes (Signed)
Physical Therapy Treatment and Discharge Patient Details Name: Lauren Briggs MRN: [id number] DOB: September 19, 1955 Today's Date: 04/24/2012 Time: 8469-6295 PT Time Calculation (min): 27 min  PT Assessment / Plan / Recommendation Comments on Treatment Session  Pt s/p multiple back surgeries and is able to demonstrate safe mobility techniques and verbalizes precautions and how to go up/down stairs. No further PT needs identified. Pt in agreement.    Follow Up Recommendations  No PT follow up     Does the patient have the potential to tolerate intense rehabilitation     Barriers to Discharge        Equipment Recommendations  None recommended by PT    Recommendations for Other Services    Frequency     Plan All goals met and education completed, patient dischaged from PT services    Precautions / Restrictions Precautions Precautions: Back Precaution Comments: Pt very familiar with back precautions and techniques.  Has had many back surgeries and years of PT for her back. Required Braces or Orthoses: Spinal Brace Spinal Brace: Lumbar corset;Applied in sitting position   Pertinent Vitals/Pain 4/10 in low back at beginning of session; 6/10 at end of session with RN notified and in to give medicine    Mobility  Bed Mobility Rolling Right: 6: Modified independent (Device/Increase time) Right Sidelying to Sit: 6: Modified independent (Device/Increase time) Sitting - Scoot to Edge of Bed: 6: Modified independent (Device/Increase time) Transfers Sit to Stand: 6: Modified independent (Device/Increase time) Stand to Sit: 6: Modified independent (Device/Increase time) Details for Transfer Assistance: x3 various surfaces/heights; incr time/effort Ambulation/Gait Ambulation/Gait Assistance: 5: Supervision Ambulation Distance (Feet): 180 Feet Assistive device: None Ambulation/Gait Assistance Details: Pt reports Lt hip is more sore today. ? her bursitis flaring up, does not feel neuropathic per  pt. Pt does not want to use RW and did well with maintaining her balance and did not display antalgic gait. Educated pt on incr strain antalgic gait will put on her spine and she acknowledged that if she begins to limp, she will use her RW. Hospital RW placed in her room to use during her stay. Gait Pattern: Within Functional Limits Stairs: No (deferred, see Subjective)    Exercises     PT Diagnosis:    PT Problem List:   PT Treatment Interventions:     PT Goals Acute Rehab PT Goals Pt will Ambulate: >150 feet;Independently PT Goal: Ambulate - Progress: Partly met (supervision due to Lt hip pain and earlier dizzy with RN) Pt will Go Up / Down Stairs: 6-9 stairs;with modified independence;with rail(s) PT Goal: Up/Down Stairs - Progress: Partly met (deferred per pt preference; she can verbalize)  Visit Information  Last PT Received On: 04/24/12 Assistance Needed: +1    Subjective Data  Subjective: Pt able to correctly verbalize how to go up/down stairs (including compensations for sore Rt hip) and deferred practicing steps prior to d/c. 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   Cognition  Cognition Overall Cognitive Status: Appears within functional limits for tasks assessed/performed    Balance     End of Session PT - End of Session Equipment Utilized During Treatment: Back brace Activity Tolerance: Patient tolerated treatment well Patient left: in chair;with call bell/phone within reach;with nursing in room Nurse Communication: Mobility status;Other (comment) (d/c from PT)   GP     Anddy Wingert 04/24/2012, 10:52 AM  04/24/2012 Veda Canning, PT Pager: [phone]

## 2012-04-24 NOTE — Progress Notes (Signed)
Occupational Therapy Treatment Patient Details Name: Lauren Briggs MRN: [id number] DOB: 1955-09-12 Today's Date: 04/24/2012 Time: 4098-1191 OT Time Calculation (min): 11 min  OT Assessment / Plan / Recommendation Comments on Treatment Session Pt doing well with goals met, no further acute OT services needed at this time, OT will sign off    Follow Up Recommendations  No OT follow up    Barriers to Discharge   None    Equipment Recommendations  None recommended by OT    Recommendations for Other Services    Frequency     Plan All goals met and education completed, patient discharged from OT services    Precautions / Restrictions Precautions Precautions: Back Precaution Comments: Pt very familiar with back precautions and techniques.  Has had many back surgeries and years of PT for her back. Required Braces or Orthoses: Spinal Brace Spinal Brace: Lumbar corset;Applied in sitting position Restrictions Weight Bearing Restrictions: No   Pertinent Vitals/Pain     ADL  Upper Body Dressing: Performed;Set up;Minimal assistance Toilet Transfer: Performed;Modified independent Toilet Transfer Method: Sit to Barista: Regular height toilet Tub/Shower Transfer: Performed;Modified independent Tub/Shower Transfer Method: Armed forces operational officer Used: Back brace ADL Comments: pt provided with education and demo of tub transfer bench    OT Diagnosis:    OT Problem List:   OT Treatment Interventions:     OT Goals ADL Goals ADL Goal: Lower Body Dressing - Progress: Progressing toward goals ADL Goal: Tub/Shower Transfer - Progress: Progressing toward goals ADL Goal: Additional Goal #1 - Progress: Progressing toward goals  Visit Information  Last OT Received On: 04/24/12 Assistance Needed: +1    Subjective Data  Subjective: "I feel good today." Patient Stated Goal: To return home   Prior Functioning        Cognition  Cognition Overall Cognitive Status: Appears within functional limits for tasks assessed/performed Arousal/Alertness: Awake/alert Orientation Level: Oriented X4 / Intact Behavior During Session: [facility] for tasks performed    Mobility  Bed Mobility Bed Mobility: Rolling Right;Right Sidelying to Sit;Sitting - Scoot to [place] of Bed;Sit to Supine Rolling Right: 6: Modified independent (Device/Increase time) Right Sidelying to Sit: 6: Modified independent (Device/Increase time) Sitting - Scoot to Edge of Bed: 6: Modified independent (Device/Increase time) Sit to Supine: 6: Modified independent (Device/Increase time);With rail;HOB elevated Details for Bed Mobility Assistance: Pt does well.  Recalls all precautions and is safe. Transfers Transfers: Sit to Stand;Stand to Sit Sit to Stand: 6: Modified independent (Device/Increase time) Stand to Sit: 6: Modified independent (Device/Increase time) Details for Transfer Assistance: x3 various surfaces/heights; incr time/effort    Exercises      Balance     End of Session OT - End of Session Equipment Utilized During Treatment: Back brace, tub bench Activity Tolerance: Patient tolerated treatment well Patient left: in chair;in bed  GO     Galen Manila 04/24/2012, 4:23 PM

## 2012-04-25 MED ORDER — GABAPENTIN 300 MG PO CAPS
300.0000 mg | ORAL_CAPSULE | Freq: Three times a day (TID) | ORAL | Status: DC
  Administered 2012-04-25 (×3): 300 mg via ORAL
  Filled 2012-04-25 (×6): qty 1

## 2012-04-25 NOTE — Progress Notes (Signed)
Still having difficulty with some left buttocks/lateral hip pain. She has some radiation into her thigh and leg as well. No right-sided symptoms. Back pain recently well-controlled. Mobilizing fairly well.  Afebrile. Vital stable. Drain output moderate. Wound clean and dry. Motor and sensory function stable. Chest and abdomen benign.  Still with some left-sided radicular irritation. We'll add Neurontin in hopes of giving her some relief. Continue steroids.

## 2012-04-26 NOTE — Progress Notes (Signed)
Pt d/c to home by car with family. Assessment stable. Dr. Jeral Fruit removed hemovac and gave pt prescriptions. Pt verbalizes understanding of d/c instructions.

## 2012-04-26 NOTE — Discharge Summary (Signed)
Physician Discharge Summary  Patient ID: IDABELLE MCPETERS MRN: [id number] DOB/AGE: [birth year] 57 y.o.  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 *   Discharged Condition: no pain  Hospital Course: surgery  Consult none  Significant Diagnostic Studies: mri  Treatments: l3 to s1 lumbar fusion  Discharge Exam: Blood pressure 132/71, pulse 100, temperature 98.1 F (36.7 C), temperature source Oral, resp. rate 18, height 5\' 5"  (1.651 m), weight 97.977 kg (216 lb), SpO2 95.00%. ambulating  Disposition:  home    Medication List    TAKE these medications       CINNAMON PO  Take 1 capsule by mouth 2 (two) times daily.     clobetasol cream 0.05 %  Commonly known as:  TEMOVATE  Apply 1 application topically daily.     Cranberry 600 MG Tabs  Take 600 mg by mouth daily.     cyclobenzaprine 10 MG tablet  Commonly known as:  FLEXERIL  Take 1 tablet (10 mg total) by mouth 3 (three) times daily as needed for muscle spasms.     estradiol 0.5 MG tablet  Commonly known as:  ESTRACE  Take 0.5 mg by mouth daily.     estradiol 25 MCG vaginal tablet  Commonly known as:  VAGIFEM  Place 25 mcg vaginally 3 (three) times a week.     ezetimibe-simvastatin 10-20 MG per tablet  Commonly known as:  VYTORIN  Take 1 tablet by mouth at bedtime.     Fish Oil 1200 MG Caps  Take 3,600 mg by mouth daily.     GRAPE SEED EXTRACT PO  Take 1 tablet by mouth daily.     HYDROcodone-acetaminophen 7.5-325 MG per tablet  Commonly known as:  NORCO  Take 1 tablet by mouth every 6 (six) hours as needed for pain.     HYDROmorphone 4 MG tablet  Commonly known as:  DILAUDID  Take 1 tablet (4 mg total) by mouth every 4 (four) hours as needed.     loratadine-pseudoephedrine 5-120 MG per tablet  Commonly known as:  CLARITIN-D 12-hour  Take 1 tablet by mouth 2  (two) times daily.     multivitamin with minerals Tabs  Take 1 tablet by mouth daily.     valACYclovir 1000 MG tablet  Commonly known as:  VALTREX  Take 1,000 mg by mouth daily.     vitamin C 1000 MG tablet  Take 1,000 mg by mouth daily.         Signed: Karn Cassis 04/26/2012, 7:59 AM

## 2012-04-27 NOTE — Care Management Note (Signed)
    Page 1 of 1   04/27/2012     8:19:21 AM   CARE MANAGEMENT NOTE 04/27/2012  Patient:  Lauren Briggs, Lauren Briggs   Account Number:  [id number]  Date Initiated:  04/27/2012  Documentation initiated by:  [facility]  Subjective/Objective Assessment:   admitted postop L3-S1 PLIF     Action/Plan:   PT/OT evals-no follow up recommended, no equipment needs   Anticipated DC Date:     Anticipated DC Plan:        DC Planning Services  CM consult      Choice offered to / List presented to:             Status of service:  Completed, signed off Medicare Important Message given?   (If response is "NO", the following Medicare IM given date fields will be blank) Date Medicare IM given:   Date Additional Medicare IM given:    Discharge Disposition:  HOME/SELF CARE  Per UR Regulation:  Reviewed for med. necessity/level of care/duration of stay  If discussed at Long Length of Stay Meetings, dates discussed:    Comments:

## 2012-05-27 ENCOUNTER — Telehealth: Payer: Self-pay | Admitting: Family Medicine

## 2012-05-27 MED ORDER — EZETIMIBE-SIMVASTATIN 10-20 MG PO TABS: 1.0000 | ORAL_TABLET | Freq: Every day | ORAL | Status: DC

## 2012-05-27 NOTE — Telephone Encounter (Signed)
Rx Refilled  

## 2012-07-14 DIAGNOSIS — M5126 Other intervertebral disc displacement, lumbar region: Secondary | ICD-10-CM | POA: Insufficient documentation

## 2012-07-28 ENCOUNTER — Ambulatory Visit (INDEPENDENT_AMBULATORY_CARE_PROVIDER_SITE_OTHER): Payer: Self-pay

## 2012-07-28 DIAGNOSIS — F4320 Adjustment disorder, unspecified: Secondary | ICD-10-CM

## 2012-08-05 ENCOUNTER — Ambulatory Visit (INDEPENDENT_AMBULATORY_CARE_PROVIDER_SITE_OTHER): Payer: Self-pay

## 2012-08-05 DIAGNOSIS — F4320 Adjustment disorder, unspecified: Secondary | ICD-10-CM

## 2012-08-12 ENCOUNTER — Ambulatory Visit (INDEPENDENT_AMBULATORY_CARE_PROVIDER_SITE_OTHER): Payer: Self-pay

## 2012-08-12 DIAGNOSIS — F4320 Adjustment disorder, unspecified: Secondary | ICD-10-CM

## 2012-08-13 ENCOUNTER — Ambulatory Visit: Payer: Self-pay

## 2012-09-30 ENCOUNTER — Ambulatory Visit (INDEPENDENT_AMBULATORY_CARE_PROVIDER_SITE_OTHER): Payer: Self-pay

## 2012-09-30 DIAGNOSIS — F4320 Adjustment disorder, unspecified: Secondary | ICD-10-CM

## 2012-11-26 ENCOUNTER — Other Ambulatory Visit: Payer: Self-pay

## 2013-03-22 ENCOUNTER — Other Ambulatory Visit: Payer: Self-pay | Admitting: Neurosurgery

## 2013-03-22 DIAGNOSIS — M5126 Other intervertebral disc displacement, lumbar region: Secondary | ICD-10-CM

## 2013-04-12 ENCOUNTER — Ambulatory Visit
Admission: RE | Admit: 2013-04-12 | Discharge: 2013-04-12 | Disposition: A | Discharge status: Home / Self Care | Payer: BC Managed Care – PPO | Source: Ambulatory Visit | Attending: Neurosurgery | Admitting: Neurosurgery

## 2013-04-12 DIAGNOSIS — M5126 Other intervertebral disc displacement, lumbar region: Secondary | ICD-10-CM

## 2013-06-21 ENCOUNTER — Other Ambulatory Visit: Payer: Self-pay | Admitting: Family Medicine

## 2013-06-22 ENCOUNTER — Encounter: Payer: Self-pay | Admitting: Family Medicine

## 2013-06-22 NOTE — Telephone Encounter (Signed)
Pt has not been seen in over one year.  Refill denied until appt made.  Letter to patient

## 2013-07-06 ENCOUNTER — Other Ambulatory Visit: Payer: Self-pay | Admitting: Family Medicine

## 2013-07-06 DIAGNOSIS — Z79899 Other long term (current) drug therapy: Secondary | ICD-10-CM

## 2013-07-06 DIAGNOSIS — E785 Hyperlipidemia, unspecified: Secondary | ICD-10-CM

## 2013-07-08 ENCOUNTER — Other Ambulatory Visit: Payer: Self-pay | Admitting: Family Medicine

## 2013-07-08 DIAGNOSIS — Z79899 Other long term (current) drug therapy: Secondary | ICD-10-CM

## 2013-07-08 DIAGNOSIS — E785 Hyperlipidemia, unspecified: Secondary | ICD-10-CM

## 2013-07-09 LAB — CBC WITH DIFFERENTIAL/PLATELET
Basophils Absolute: 0.1 10*3/uL (ref 0.0–0.1)
Basophils Relative: 1 % (ref 0–1)
Eosinophils Absolute: 0.1 10*3/uL (ref 0.0–0.7)
Eosinophils Relative: 2 % (ref 0–5)
HCT: 42.5 % (ref 36.0–46.0)
Hemoglobin: 14 g/dL (ref 12.0–15.0)
Lymphocytes Relative: 37 % (ref 12–46)
Lymphs Abs: 2 10*3/uL (ref 0.7–4.0)
MCH: 31.3 pg (ref 26.0–34.0)
MCHC: 32.9 g/dL (ref 30.0–36.0)
MCV: 94.9 fL (ref 78.0–100.0)
Monocytes Absolute: 0.3 10*3/uL (ref 0.1–1.0)
Monocytes Relative: 6 % (ref 3–12)
Neutro Abs: 2.9 10*3/uL (ref 1.7–7.7)
Neutrophils Relative %: 54 % (ref 43–77)
Platelets: 279 10*3/uL (ref 150–400)
RBC: 4.48 MIL/uL (ref 3.87–5.11)
RDW: 14.6 % (ref 11.5–15.5)
WBC: 5.4 10*3/uL (ref 4.0–10.5)

## 2013-07-09 LAB — COMPREHENSIVE METABOLIC PANEL
ALT: 25 U/L (ref 0–35)
AST: 24 U/L (ref 0–37)
Albumin: 4.6 g/dL (ref 3.5–5.2)
Alkaline Phosphatase: 47 U/L (ref 39–117)
BUN: 18 mg/dL (ref 6–23)
CO2: 24 mEq/L (ref 19–32)
Calcium: 9.5 mg/dL (ref 8.4–10.5)
Chloride: 105 mEq/L (ref 96–112)
Creat: 0.74 mg/dL (ref 0.50–1.10)
Glucose, Bld: 101 mg/dL — ABNORMAL HIGH (ref 70–99)
Potassium: 4.3 mEq/L (ref 3.5–5.3)
Sodium: 140 mEq/L (ref 135–145)
Total Bilirubin: 0.5 mg/dL (ref 0.2–1.2)
Total Protein: 7.1 g/dL (ref 6.0–8.3)

## 2013-07-09 LAB — LIPID PANEL
Cholesterol: 225 mg/dL — ABNORMAL HIGH (ref 0–200)
HDL: 76 mg/dL (ref >39)
LDL Cholesterol: 97 mg/dL (ref 0–99)
Total CHOL/HDL Ratio: 3 Ratio
Triglycerides: 258 mg/dL — ABNORMAL HIGH (ref <150)
VLDL: 52 mg/dL — ABNORMAL HIGH (ref 0–40)

## 2013-07-22 ENCOUNTER — Encounter: Payer: Self-pay | Admitting: Family Medicine

## 2013-07-22 ENCOUNTER — Ambulatory Visit (INDEPENDENT_AMBULATORY_CARE_PROVIDER_SITE_OTHER): Payer: BC Managed Care – HMO | Admitting: Family Medicine

## 2013-07-22 VITALS — BP 132/74 | HR 76 | Temp 97.1°F | Resp 16 | Ht 64.0 in | Wt 222.0 lb

## 2013-07-22 DIAGNOSIS — E785 Hyperlipidemia, unspecified: Secondary | ICD-10-CM

## 2013-07-22 MED ORDER — EZETIMIBE-SIMVASTATIN 10-20 MG PO TABS: 1.0000 | ORAL_TABLET | Freq: Every day | ORAL | Status: DC

## 2013-07-22 NOTE — Progress Notes (Signed)
Subjective:    Patient ID: Lauren Briggs, female    DOB: [birth year], 58 y.o.   MRN: [id number]  HPI Patient is here today for followup of her hyperlipidemia. She is currently on vytorin 10/20 by mouth daily.  She denies any myalgias or right upper quadrant pain. She is a significant family history of coronary artery disease in several grandparents and her father all having heart attacks. She denies any chest pain shortness of breath or dyspnea on exertion. Her blood pressure is excellent. Her most recent labwork as listed below: Orders Only on 07/08/2013  Component Date Value Ref Range Status  . WBC 07/08/2013 5.4  4.0 - 10.5 K/uL Final  . RBC 07/08/2013 4.48  3.87 - 5.11 MIL/uL Final  . Hemoglobin 07/08/2013 14.0  12.0 - 15.0 g/dL Final  . HCT 07/08/2013 42.5  36.0 - 46.0 % Final  . MCV 07/08/2013 94.9  78.0 - 100.0 fL Final  . MCH 07/08/2013 31.3  26.0 - 34.0 pg Final  . MCHC 07/08/2013 32.9  30.0 - 36.0 g/dL Final  . RDW 07/08/2013 14.6  11.5 - 15.5 % Final  . Platelets 07/08/2013 279  150 - 400 K/uL Final  . Neutrophils Relative % 07/08/2013 54  43 - 77 % Final  . Neutro Abs 07/08/2013 2.9  1.7 - 7.7 K/uL Final  . Lymphocytes Relative 07/08/2013 37  12 - 46 % Final  . Lymphs Abs 07/08/2013 2.0  0.7 - 4.0 K/uL Final  . Monocytes Relative 07/08/2013 6  3 - 12 % Final  . Monocytes Absolute 07/08/2013 0.3  0.1 - 1.0 K/uL Final  . Eosinophils Relative 07/08/2013 2  0 - 5 % Final  . Eosinophils Absolute 07/08/2013 0.1  0.0 - 0.7 K/uL Final  . Basophils Relative 07/08/2013 1  0 - 1 % Final  . Basophils Absolute 07/08/2013 0.1  0.0 - 0.1 K/uL Final  . Smear Review 07/08/2013 Criteria for review not met   Final  . Sodium 07/08/2013 140  135 - 145 mEq/L Final  . Potassium 07/08/2013 4.3  3.5 - 5.3 mEq/L Final  . Chloride 07/08/2013 105  96 - 112 mEq/L Final  . CO2 07/08/2013 24  19 - 32 mEq/L Final  . Glucose, Bld 07/08/2013 101* 70 - 99 mg/dL Final  . BUN 07/08/2013 18  6 - 23 mg/dL  Final  . Creat 07/08/2013 0.74  0.50 - 1.10 mg/dL Final  . Total Bilirubin 07/08/2013 0.5  0.2 - 1.2 mg/dL Final  . Alkaline Phosphatase 07/08/2013 47  39 - 117 U/L Final  . AST 07/08/2013 24  0 - 37 U/L Final  . ALT 07/08/2013 25  0 - 35 U/L Final  . Total Protein 07/08/2013 7.1  6.0 - 8.3 g/dL Final  . Albumin 07/08/2013 4.6  3.5 - 5.2 g/dL Final  . Calcium 07/08/2013 9.5  8.4 - 10.5 mg/dL Final  . Cholesterol 07/08/2013 225* 0 - 200 mg/dL Final   Comment: ATP III Classification:                                < 200        mg/dL        Desirable                               200 - 239  mg/dL        Borderline High                               >= 240        mg/dL        High                             . Triglycerides 07/08/2013 258* <150 mg/dL Final  . HDL 07/08/2013 76  >39 mg/dL Final  . Total CHOL/HDL Ratio 07/08/2013 3.0   Final  . VLDL 07/08/2013 52* 0 - 40 mg/dL Final  . LDL Cholesterol 07/08/2013 97  0 - 99 mg/dL Final   Comment:                            Total Cholesterol/HDL Ratio:CHD Risk                                                 Coronary Heart Disease Risk Table                                                                 Men       Women                                   1/2 Average Risk              3.4        3.3                                       Average Risk              5.0        4.4                                    2X Average Risk              9.6        7.1                                    3X Average Risk             23.4       11.0                          Use the calculated Patient Ratio above and the CHD Risk table                           to determine the patient's CHD Risk.  ATP III Classification (LDL):                                < 100        mg/dL         Optimal                               100 - 129     mg/dL         Near or Above Optimal                               130 - 159     mg/dL          Borderline High                               160 - 189     mg/dL         High                                > 190        mg/dL         Very High                              Past Medical History  Diagnosis Date  . Low back pain radiating to right leg   . Hyperlipidemia   . Complication of anesthesia     HX Back CHIPPED TOOTH S/P EXTUBATION    Current Outpatient Prescriptions on File Prior to Visit  Medication Sig Dispense Refill  . Ascorbic Acid (VITAMIN C) 1000 MG tablet Take 1,000 mg by mouth daily.      Marland Kitchen CINNAMON PO Take 1 capsule by mouth 2 (two) times daily.      . clobetasol cream (TEMOVATE) 3.71 % Apply 1 application topically daily.      . Cranberry 600 MG TABS Take 600 mg by mouth daily.      Marland Kitchen estradiol (ESTRACE) 0.5 MG tablet Take 0.5 mg by mouth daily.      Marland Kitchen estradiol (VAGIFEM) 25 MCG vaginal tablet Place 25 mcg vaginally 3 (three) times a week.      Marland Kitchen GRAPE SEED EXTRACT PO Take 1 tablet by mouth daily.      Marland Kitchen loratadine-pseudoephedrine (CLARITIN-D 12-HOUR) 5-120 MG per tablet Take 1 tablet by mouth 2 (two) times daily.      . Multiple Vitamin (MULTIVITAMIN WITH MINERALS) TABS Take 1 tablet by mouth daily.      . Omega-3 Fatty Acids (FISH OIL) 1200 MG CAPS Take 3,600 mg by mouth daily.      . valACYclovir (VALTREX) 1000 MG tablet Take 1,000 mg by mouth daily.       No current facility-administered medications on file prior to visit.   Allergies  Allergen Reactions  . Propoxyphene Hcl     REACTION: heart races   History   Social History  . Marital Status: Married    Spouse Name: N/A    Number of Children: N/A  . Years of Education: N/A   Occupational History  . Not on file.  Social History Main Topics  . Smoking status: Former Smoker    Types: Cigarettes  . Smokeless tobacco: Former Systems developer    Quit date: 01/22/1979  . Alcohol Use: No  . Drug Use: No  . Sexual Activity: Not on file   Other Topics Concern  . Not on file   Social History Narrative  .  No narrative on file   No family history on file.    Review of Systems  All other systems reviewed and are negative.      Objective:   Physical Exam  Vitals reviewed. Neck: Neck supple. No JVD present. No thyromegaly present.  Cardiovascular: Normal rate, regular rhythm and normal heart sounds.  Exam reveals no gallop and no friction rub.   No murmur heard. Pulmonary/Chest: Effort normal and breath sounds normal. No respiratory distress. She has no wheezes. She has no rales. She exhibits no tenderness.  Abdominal: Soft. Bowel sounds are normal. She exhibits no distension and no mass. There is no tenderness. There is no rebound and no guarding.  Musculoskeletal: She exhibits no edema.  Lymphadenopathy:    She has no cervical adenopathy.          Assessment & Plan:  1. HLD (hyperlipidemia) Continue vytorin 10/20 mg poqday.  Recommended diet, exercise, and weight loss to address her elevated trigs.  Mammogram Pap smear and colonoscopy are up-to-date. - ezetimibe-simvastatin (VYTORIN) 10-20 MG per tablet; Take 1 tablet by mouth at bedtime.  Dispense: 90 tablet; Refill: 3

## 2013-09-07 ENCOUNTER — Other Ambulatory Visit: Payer: Self-pay | Admitting: Neurosurgery

## 2013-09-07 DIAGNOSIS — IMO0002 Reserved for concepts with insufficient information to code with codable children: Secondary | ICD-10-CM

## 2013-09-10 ENCOUNTER — Ambulatory Visit
Admission: RE | Admit: 2013-09-10 | Discharge: 2013-09-10 | Disposition: A | Discharge status: Home / Self Care | Payer: BC Managed Care – PPO | Source: Ambulatory Visit | Attending: Neurosurgery | Admitting: Neurosurgery

## 2013-09-10 DIAGNOSIS — IMO0002 Reserved for concepts with insufficient information to code with codable children: Secondary | ICD-10-CM

## 2013-10-28 ENCOUNTER — Emergency Department (HOSPITAL_COMMUNITY)
Admission: EM | Admit: 2013-10-28 | Discharge: 2013-10-28 | Disposition: A | Discharge status: Home / Self Care | Payer: BC Managed Care – PPO | Source: Home / Self Care

## 2013-10-28 ENCOUNTER — Encounter (HOSPITAL_COMMUNITY): Payer: Self-pay | Admitting: Emergency Medicine

## 2013-10-28 DIAGNOSIS — J019 Acute sinusitis, unspecified: Secondary | ICD-10-CM

## 2013-10-28 MED ORDER — FLUTICASONE PROPIONATE 50 MCG/ACT NA SUSP: 2.0000 | Freq: Every day | NASAL | Status: DC

## 2013-10-28 MED ORDER — MOXIFLOXACIN HCL 400 MG PO TABS: 400.0000 mg | ORAL_TABLET | Freq: Every day | ORAL | Status: DC

## 2013-10-28 NOTE — ED Notes (Signed)
Reports a three week history of otc medicines and cough and congestion.  Saw provider [facility] on [facility] 10/[facility].  They started her on an antibiotic.  Reports she is feeling no better.  Continues with sinus and chest congestion and coughing episodes until she is gagging.  Phlegm has been clear to yellow

## 2013-10-28 NOTE — ED Provider Notes (Signed)
  Chief Complaint   URI   History of Present Illness   Lauren Briggs is a 58 year old female who's had a 3 week history of postnasal drip, nasal congestion with clear to yellowish drainage with some blood, headache, sinus pressure, and ear congestion. She's also had a cough productive yellow sputum, chest tightness, and wheezing she denies any fever, sore throat, or GI symptoms. She has no history of allergies or asthma. She was seen at [facility] about a week ago and had a chest x-ray which was negative. She was given Omnicef and albuterol inhaler but feels no better. She's had no sick exposures.  Review of Systems   Other than as noted above, the patient denies any of the following symptoms: Systemic:  No fevers, chills, sweats, or myalgias. Eye:  No redness or discharge. ENT:  No ear pain, headache, nasal congestion, drainage, sinus pressure, or sore throat. Neck:  No neck pain, stiffness, or swollen glands. Lungs:  No cough, sputum production, hemoptysis, wheezing, chest tightness, shortness of breath or chest pain. GI:  No abdominal pain, nausea, vomiting or diarrhea.  [place]   Past medical history, family history, social history, meds, and allergies were reviewed. She has elevated cholesterol, chronic back pain, and sciatic nerve injury. Current meds include Vytorin, estradiol, Valtrex, Cymbalta, Topamax, and tramadol.  Physical exam   Vital signs:  BP 172/90  Pulse 92  Temp(Src) 98.2 F (36.8 C) (Oral)  Resp 20  SpO2 100% General:  Alert and oriented.  In no distress.  Skin warm and dry. Eye:  No conjunctival injection or drainage. Lids were normal. ENT:  TMs and canals were normal, without erythema or inflammation.  Nasal mucosa was clear and uncongested, without drainage.  Mucous membranes were moist.  Pharynx was clear with no exudate or drainage.  There were no oral ulcerations or lesions. Neck:  Supple, no adenopathy, tenderness or mass. Lungs:  No respiratory distress.   Lungs were clear to auscultation, without wheezes, rales or rhonchi.  Breath sounds were clear and equal bilaterally.  Heart:  Regular rhythm, without gallops, murmers or rubs. Skin:  Clear, warm, and dry, without rash or lesions.   Assessment     The encounter diagnosis was Acute sinusitis, recurrence not specified, unspecified location.  Plan    1.  Meds:  The following meds were prescribed:   New Prescriptions   FLUTICASONE (FLONASE) 50 MCG/ACT NASAL SPRAY    Place 2 sprays into both nostrils daily.   MOXIFLOXACIN (AVELOX) 400 MG TABLET    Take 1 tablet (400 mg total) by mouth daily at 8 pm.    2.  Patient Education/Counseling:  The patient was given appropriate handouts, self care instructions, and instructed in symptomatic relief.  Instructed to get extra fluids and extra rest.    3.  Follow up:  The patient was told to follow up here if no better in 3 to 4 days, or sooner if becoming worse in any way, and given some red flag symptoms such as increasing fever, difficulty breathing, chest pain, or persistent vomiting which would prompt immediate return. If no better after 10 days suggested she see ENT.      Harden Mo, MD 10/28/13 [phone]

## 2013-10-28 NOTE — Discharge Instructions (Signed)

## 2014-04-18 ENCOUNTER — Other Ambulatory Visit: Payer: Self-pay | Admitting: Neurosurgery

## 2014-04-18 DIAGNOSIS — M5416 Radiculopathy, lumbar region: Secondary | ICD-10-CM

## 2014-04-21 ENCOUNTER — Ambulatory Visit
Admission: RE | Admit: 2014-04-21 | Discharge: 2014-04-21 | Disposition: A | Discharge status: Home / Self Care | Payer: 59 | Source: Ambulatory Visit | Attending: Neurosurgery | Admitting: Neurosurgery

## 2014-04-21 DIAGNOSIS — M5416 Radiculopathy, lumbar region: Secondary | ICD-10-CM

## 2014-05-24 ENCOUNTER — Other Ambulatory Visit: Payer: Self-pay | Admitting: Neurosurgery

## 2014-07-02 NOTE — Pre-Procedure Instructions (Signed)
Lauren Briggs  07/02/2014     Your procedure is scheduled on June 20  Report to [facility] at 10 A.M.  Call this number if you have problems the morning of surgery:  [phone]   Remember:  Do not eat food or drink liquids after midnight.  Take these medicines the morning of surgery with A SIP OF WATER Hydrocodone (if needed), estradiol,   STOP Salmon Oil, Fish Oil, Flaxseed Oil, Multiple Vitamin, Cranberry Plus, Cinnamon, Ester- C, Bee Pollen today   STOP/ Do not take Aspirin, Aleve, Naproxen, Advil, Ibuprofen, Motrin, Vitamins, Herbs, or Supplements starting today   Do not wear jewelry, make-up or nail polish.  Do not wear lotions, powders, or perfumes.  You may wear deodorant.  Do not shave 48 hours prior to surgery.  Men may shave face and neck.  Do not bring valuables to the hospital.  [facility] is not responsible for any belongings or valuables.  Contacts, dentures or bridgework may not be worn into surgery.  Leave your suitcase in the car.  After surgery it may be brought to your room.  For patients admitted to the hospital, discharge time will be determined by your treatment team.  Patients discharged the day of surgery will not be allowed to drive home.   [facility] [facility] - Preparing for Surgery  Before surgery, you can play an important role.  Because skin is not sterile, your skin needs to be as free of germs as possible.  You can reduce the number of germs on you skin by washing with CHG (chlorahexidine gluconate) soap before surgery.  CHG is an antiseptic cleaner which kills germs and bonds with the skin to continue killing germs even after washing.  Please DO NOT use if you have an allergy to CHG or antibacterial soaps.  If your skin becomes reddened/irritated stop using the CHG and inform your nurse when you arrive at Short Stay.  Do not shave (including legs and underarms) for at least 48 hours prior to the first CHG shower.  You may shave  your face.  Please follow these instructions carefully:   1.  Shower with CHG Soap the night before surgery and the morning of Surgery.  2.  If you choose to wash your hair, wash your hair first as usual with your normal shampoo.  3.  After you shampoo, rinse your hair and body thoroughly to remove the shampoo.  4.  Use CHG as you would any other liquid soap.  You can apply CHG directly to the skin and wash gently with scrungie or a clean washcloth.  5.  Apply the CHG Soap to your body ONLY FROM THE NECK DOWN.  Do not use on open wounds or open sores.  Avoid contact with your eyes, ears, mouth and genitals (private parts).  Wash genitals (private parts) with your normal soap.  6.  Wash thoroughly, paying special attention to the area where your surgery will be performed.  7.  Thoroughly rinse your body with warm water from the neck down.  8.  DO NOT shower/wash with your normal soap after using and rinsing off the CHG Soap.  9.  Pat yourself dry with a clean towel.            10.  Wear clean pajamas.            11.  Place clean sheets on your bed the night of your first shower and do not  sleep with pets.  Day of Surgery  Do not apply any lotions the morning of surgery.  Please wear clean clothes to the hospital/surgery center.   Please read over the following fact sheets that you were given. Pain Booklet, Coughing and Deep Breathing, Blood Transfusion Information and Surgical Site Infection Prevention

## 2014-07-04 ENCOUNTER — Encounter (HOSPITAL_COMMUNITY): Payer: Self-pay | Admitting: *Deleted

## 2014-07-04 ENCOUNTER — Encounter (HOSPITAL_COMMUNITY)
Admission: RE | Admit: 2014-07-04 | Discharge: 2014-07-04 | Disposition: A | Discharge status: Home / Self Care | Payer: 59 | Source: Ambulatory Visit | Attending: Neurosurgery | Admitting: Neurosurgery

## 2014-07-04 DIAGNOSIS — S329XXD Fracture of unspecified parts of lumbosacral spine and pelvis, subsequent encounter for fracture with routine healing: Secondary | ICD-10-CM | POA: Insufficient documentation

## 2014-07-04 DIAGNOSIS — Z01818 Encounter for other preprocedural examination: Secondary | ICD-10-CM | POA: Insufficient documentation

## 2014-07-04 LAB — CBC
HCT: 40.1 % (ref 36.0–46.0)
Hemoglobin: 12.8 g/dL (ref 12.0–15.0)
MCH: 30 pg (ref 26.0–34.0)
MCHC: 31.9 g/dL (ref 30.0–36.0)
MCV: 94.1 fL (ref 78.0–100.0)
Platelets: 262 K/uL (ref 150–400)
RBC: 4.26 MIL/uL (ref 3.87–5.11)
RDW: 13.6 % (ref 11.5–15.5)
WBC: 5.1 K/uL (ref 4.0–10.5)

## 2014-07-04 LAB — BASIC METABOLIC PANEL WITH GFR
Anion gap: 8 (ref 5–15)
BUN: 16 mg/dL (ref 6–20)
CO2: 25 mmol/L (ref 22–32)
Calcium: 9.2 mg/dL (ref 8.9–10.3)
Chloride: 106 mmol/L (ref 101–111)
Creatinine, Ser: 0.64 mg/dL (ref 0.44–1.00)
GFR calc Af Amer: >60 mL/min
GFR calc non Af Amer: >60 mL/min
Glucose, Bld: 129 mg/dL — ABNORMAL HIGH (ref 65–99)
Potassium: 3.8 mmol/L (ref 3.5–5.1)
Sodium: 139 mmol/L (ref 135–145)

## 2014-07-04 LAB — TYPE AND SCREEN
ABO/RH(D): O POS
Antibody Screen: NEGATIVE

## 2014-07-04 NOTE — Progress Notes (Signed)
Lauren Briggs at Dr Windy Carina office called to clarify consent orders ([facility])

## 2014-07-04 NOTE — Progress Notes (Signed)
Lab called about PCR screen, states unable to read, culture is pending.

## 2014-07-04 NOTE — Progress Notes (Signed)
PCP is Dr Jenna Luo States she saw a cardiologist 10-15 years ago and had a stress test. States that everything was fine, but she doesn't remember the dr's name. Reports a history of HTN, but after loosing 70 pounds, she no longer takes meds for it.

## 2014-07-04 NOTE — Progress Notes (Signed)
Anesthesia Chart Review:  Pt is 59 year old female scheduled for L5-S1 posterior lateral fusion on 07/11/2014 with Dr. Saintclair Halsted.   PMH includes: hyperlipidemia. Has hx of HTN (lost weight, no longer needs medication). Former smoker. BMI 41. S/p 3 level posterior lumbar fusion 04/22/12.   Preoperative labs reviewed.    EKG 07/04/2014: NSR. Cannot rule out Anterior infarct, age undetermined. Appears unchanged when compared to EKG tracing dated 04/20/2012.   If no changes, I anticipate pt can proceed with surgery as scheduled.   Willeen Cass, FNP-BC [facility]/Anesthesiology Phone: [phone] 07/04/2014 3:26 PM

## 2014-07-06 LAB — MRSA CULTURE

## 2014-07-11 ENCOUNTER — Encounter (HOSPITAL_COMMUNITY)
Admission: RE | Disposition: A | Discharge status: Home / Self Care | Payer: Self-pay | Source: Ambulatory Visit | Attending: Neurosurgery

## 2014-07-11 ENCOUNTER — Inpatient Hospital Stay (HOSPITAL_COMMUNITY): Payer: 59 | Admitting: Emergency Medicine

## 2014-07-11 ENCOUNTER — Inpatient Hospital Stay (HOSPITAL_COMMUNITY)
Admission: RE | Admit: 2014-07-11 | Discharge: 2014-07-12 | DRG: 460 | Disposition: A | Discharge status: Home / Self Care | Payer: 59 | Source: Ambulatory Visit | Attending: Neurosurgery | Admitting: Neurosurgery

## 2014-07-11 ENCOUNTER — Inpatient Hospital Stay (HOSPITAL_COMMUNITY): Payer: 59

## 2014-07-11 ENCOUNTER — Encounter (HOSPITAL_COMMUNITY): Payer: Self-pay | Admitting: *Deleted

## 2014-07-11 ENCOUNTER — Inpatient Hospital Stay (HOSPITAL_COMMUNITY): Payer: 59 | Admitting: Anesthesiology

## 2014-07-11 DIAGNOSIS — E785 Hyperlipidemia, unspecified: Secondary | ICD-10-CM | POA: Diagnosis present

## 2014-07-11 DIAGNOSIS — Z419 Encounter for procedure for purposes other than remedying health state, unspecified: Secondary | ICD-10-CM

## 2014-07-11 DIAGNOSIS — Z886 Allergy status to analgesic agent status: Secondary | ICD-10-CM

## 2014-07-11 DIAGNOSIS — M960 Pseudarthrosis after fusion or arthrodesis: Principal | ICD-10-CM | POA: Diagnosis present

## 2014-07-11 DIAGNOSIS — Z79891 Long term (current) use of opiate analgesic: Secondary | ICD-10-CM | POA: Diagnosis not present

## 2014-07-11 DIAGNOSIS — Z90710 Acquired absence of both cervix and uterus: Secondary | ICD-10-CM

## 2014-07-11 DIAGNOSIS — M1990 Unspecified osteoarthritis, unspecified site: Secondary | ICD-10-CM | POA: Diagnosis present

## 2014-07-11 DIAGNOSIS — S32009K Unspecified fracture of unspecified lumbar vertebra, subsequent encounter for fracture with nonunion: Secondary | ICD-10-CM | POA: Diagnosis present

## 2014-07-11 DIAGNOSIS — Z79899 Other long term (current) drug therapy: Secondary | ICD-10-CM

## 2014-07-11 DIAGNOSIS — Z87891 Personal history of nicotine dependence: Secondary | ICD-10-CM

## 2014-07-11 DIAGNOSIS — M79605 Pain in left leg: Secondary | ICD-10-CM | POA: Diagnosis present

## 2014-07-11 HISTORY — DX: Unspecified osteoarthritis, unspecified site: M19.90

## 2014-07-11 HISTORY — DX: Reserved for inherently not codable concepts without codable children: IMO0001

## 2014-07-11 HISTORY — DX: Myoneural disorder, unspecified: G70.9

## 2014-07-11 SURGERY — POSTERIOR LUMBAR FUSION 1 LEVEL
Anesthesia: General | Site: Back

## 2014-07-11 MED ORDER — LIDOCAINE HCL (CARDIAC) 20 MG/ML IV SOLN
INTRAVENOUS | Status: DC | PRN
  Administered 2014-07-11: 60 mg via INTRAVENOUS
  Administered 2014-07-11: 40 mg via INTRAVENOUS

## 2014-07-11 MED ORDER — ALUM & MAG HYDROXIDE-SIMETH 200-200-20 MG/5ML PO SUSP
30.0000 mL | Freq: Four times a day (QID) | ORAL | Status: DC | PRN
  Administered 2014-07-12: 30 mL via ORAL
  Filled 2014-07-11: qty 30

## 2014-07-11 MED ORDER — OXYCODONE HCL 5 MG PO TABS
ORAL_TABLET | ORAL | Status: AC
  Filled 2014-07-11: qty 1

## 2014-07-11 MED ORDER — CEFAZOLIN SODIUM-DEXTROSE 2-3 GM-% IV SOLR
2.0000 g | INTRAVENOUS | Status: AC
  Administered 2014-07-11: 2 g via INTRAVENOUS
  Filled 2014-07-11: qty 50

## 2014-07-11 MED ORDER — LIDOCAINE-EPINEPHRINE 1 %-1:100000 IJ SOLN
INTRAMUSCULAR | Status: DC | PRN
  Administered 2014-07-11: 20 mL

## 2014-07-11 MED ORDER — ACETAMINOPHEN 650 MG RE SUPP: 650.0000 mg | RECTAL | Status: DC | PRN

## 2014-07-11 MED ORDER — FENTANYL CITRATE (PF) 250 MCG/5ML IJ SOLN
INTRAMUSCULAR | Status: AC
  Filled 2014-07-11: qty 5

## 2014-07-11 MED ORDER — BUPIVACAINE LIPOSOME 1.3 % IJ SUSP
20.0000 mL | INTRAMUSCULAR | Status: AC
  Administered 2014-07-11: 20 mL
  Filled 2014-07-11: qty 20

## 2014-07-11 MED ORDER — MIDAZOLAM HCL 5 MG/5ML IJ SOLN
INTRAMUSCULAR | Status: DC | PRN
  Administered 2014-07-11: 2 mg via INTRAVENOUS

## 2014-07-11 MED ORDER — EZETIMIBE-SIMVASTATIN 10-20 MG PO TABS
1.0000 | ORAL_TABLET | Freq: Every day | ORAL | Status: DC
  Administered 2014-07-11: 1 via ORAL
  Filled 2014-07-11 (×2): qty 1

## 2014-07-11 MED ORDER — PROPOFOL 10 MG/ML IV BOLUS
INTRAVENOUS | Status: AC
  Filled 2014-07-11: qty 20

## 2014-07-11 MED ORDER — HEMOSTATIC AGENTS (NO CHARGE) OPTIME
TOPICAL | Status: DC | PRN
  Administered 2014-07-11: 1 via TOPICAL

## 2014-07-11 MED ORDER — MENTHOL 3 MG MT LOZG: 1.0000 | LOZENGE | OROMUCOSAL | Status: DC | PRN

## 2014-07-11 MED ORDER — VANCOMYCIN HCL 1000 MG IV SOLR
INTRAVENOUS | Status: DC | PRN
  Administered 2014-07-11: 1000 mg via TOPICAL

## 2014-07-11 MED ORDER — CYCLOBENZAPRINE HCL 10 MG PO TABS
10.0000 mg | ORAL_TABLET | Freq: Three times a day (TID) | ORAL | Status: DC | PRN
  Administered 2014-07-11: 10 mg via ORAL
  Filled 2014-07-11 (×2): qty 1

## 2014-07-11 MED ORDER — OXYCODONE HCL 5 MG PO TABS
5.0000 mg | ORAL_TABLET | Freq: Once | ORAL | Status: AC | PRN
  Administered 2014-07-11: 5 mg via ORAL

## 2014-07-11 MED ORDER — 0.9 % SODIUM CHLORIDE (POUR BTL) OPTIME
TOPICAL | Status: DC | PRN
  Administered 2014-07-11: 1000 mL

## 2014-07-11 MED ORDER — VANCOMYCIN HCL 1000 MG IV SOLR
INTRAVENOUS | Status: AC
  Filled 2014-07-11: qty 1000

## 2014-07-11 MED ORDER — HYDROMORPHONE HCL 1 MG/ML IJ SOLN
0.2500 mg | INTRAMUSCULAR | Status: DC | PRN
  Administered 2014-07-11 (×4): 0.5 mg via INTRAVENOUS

## 2014-07-11 MED ORDER — VALACYCLOVIR HCL 500 MG PO TABS
1000.0000 mg | ORAL_TABLET | Freq: Every day | ORAL | Status: DC
  Administered 2014-07-11: 1000 mg via ORAL
  Filled 2014-07-11 (×2): qty 2

## 2014-07-11 MED ORDER — VECURONIUM BROMIDE 10 MG IV SOLR
INTRAVENOUS | Status: DC | PRN
  Administered 2014-07-11: 2 mg via INTRAVENOUS
  Administered 2014-07-11: 6 mg via INTRAVENOUS
  Administered 2014-07-11: 2 mg via INTRAVENOUS

## 2014-07-11 MED ORDER — HYDROMORPHONE HCL 1 MG/ML IJ SOLN
0.5000 mg | INTRAMUSCULAR | Status: DC | PRN
  Administered 2014-07-11: 1 mg via INTRAVENOUS
  Filled 2014-07-11: qty 1

## 2014-07-11 MED ORDER — THROMBIN 20000 UNITS EX SOLR
CUTANEOUS | Status: DC | PRN
  Administered 2014-07-11: 15:00:00 via TOPICAL

## 2014-07-11 MED ORDER — ONDANSETRON HCL 4 MG/2ML IJ SOLN: 4.0000 mg | INTRAMUSCULAR | Status: DC | PRN

## 2014-07-11 MED ORDER — ARTIFICIAL TEARS OP OINT
TOPICAL_OINTMENT | OPHTHALMIC | Status: DC | PRN
  Administered 2014-07-11: 1 via OPHTHALMIC

## 2014-07-11 MED ORDER — PHENOL 1.4 % MT LIQD: 1.0000 | OROMUCOSAL | Status: DC | PRN

## 2014-07-11 MED ORDER — EPHEDRINE SULFATE 50 MG/ML IJ SOLN
INTRAMUSCULAR | Status: DC | PRN
  Administered 2014-07-11: 10 mg via INTRAVENOUS

## 2014-07-11 MED ORDER — FENTANYL CITRATE (PF) 250 MCG/5ML IJ SOLN
INTRAMUSCULAR | Status: DC | PRN
  Administered 2014-07-11: 100 ug via INTRAVENOUS
  Administered 2014-07-11 (×2): 50 ug via INTRAVENOUS
  Administered 2014-07-11: 100 ug via INTRAVENOUS
  Administered 2014-07-11: 50 ug via INTRAVENOUS
  Administered 2014-07-11: 100 ug via INTRAVENOUS
  Administered 2014-07-11: 50 ug via INTRAVENOUS

## 2014-07-11 MED ORDER — SODIUM CHLORIDE 0.9 % IR SOLN
Status: DC | PRN
  Administered 2014-07-11: 15:00:00

## 2014-07-11 MED ORDER — DULOXETINE HCL 30 MG PO CPEP
30.0000 mg | ORAL_CAPSULE | Freq: Every day | ORAL | Status: DC
  Administered 2014-07-11 – 2014-07-12 (×2): 30 mg via ORAL
  Filled 2014-07-11 (×2): qty 1

## 2014-07-11 MED ORDER — CEFAZOLIN SODIUM-DEXTROSE 2-3 GM-% IV SOLR
2.0000 g | Freq: Three times a day (TID) | INTRAVENOUS | Status: DC
  Administered 2014-07-11 – 2014-07-12 (×2): 2 g via INTRAVENOUS
  Filled 2014-07-11 (×4): qty 50

## 2014-07-11 MED ORDER — GLYCOPYRROLATE 0.2 MG/ML IJ SOLN
INTRAMUSCULAR | Status: DC | PRN
  Administered 2014-07-11: 0.6 mg via INTRAVENOUS

## 2014-07-11 MED ORDER — ACETAMINOPHEN 325 MG PO TABS: 650.0000 mg | ORAL_TABLET | ORAL | Status: DC | PRN

## 2014-07-11 MED ORDER — DEXAMETHASONE SODIUM PHOSPHATE 10 MG/ML IJ SOLN
10.0000 mg | INTRAMUSCULAR | Status: AC
  Administered 2014-07-11: 10 mg via INTRAVENOUS
  Filled 2014-07-11: qty 1

## 2014-07-11 MED ORDER — OXYCODONE HCL 5 MG/5ML PO SOLN: 5.0000 mg | Freq: Once | ORAL | Status: AC | PRN

## 2014-07-11 MED ORDER — DOCUSATE SODIUM 100 MG PO CAPS
100.0000 mg | ORAL_CAPSULE | Freq: Two times a day (BID) | ORAL | Status: DC
Start: 2014-07-11 — End: 2014-07-12
  Administered 2014-07-11 – 2014-07-12 (×2): 100 mg via ORAL
  Filled 2014-07-11 (×2): qty 1

## 2014-07-11 MED ORDER — ONDANSETRON HCL 4 MG/2ML IJ SOLN
INTRAMUSCULAR | Status: DC | PRN
  Administered 2014-07-11: 4 mg via INTRAVENOUS

## 2014-07-11 MED ORDER — PROPOFOL 10 MG/ML IV BOLUS
INTRAVENOUS | Status: DC | PRN
Start: 2014-07-11 — End: 2014-07-11
  Administered 2014-07-11: 200 mg via INTRAVENOUS

## 2014-07-11 MED ORDER — ADULT MULTIVITAMIN W/MINERALS CH
1.0000 | ORAL_TABLET | Freq: Every day | ORAL | Status: DC
  Filled 2014-07-11 (×2): qty 1

## 2014-07-11 MED ORDER — NEOSTIGMINE METHYLSULFATE 10 MG/10ML IV SOLN
INTRAVENOUS | Status: DC | PRN
  Administered 2014-07-11: 5 mg via INTRAVENOUS

## 2014-07-11 MED ORDER — SODIUM CHLORIDE 0.9 % IV SOLN
10.0000 mg | INTRAVENOUS | Status: DC | PRN
  Administered 2014-07-11: 10 ug/min via INTRAVENOUS

## 2014-07-11 MED ORDER — HYDROMORPHONE HCL 1 MG/ML IJ SOLN
INTRAMUSCULAR | Status: AC
  Filled 2014-07-11: qty 1

## 2014-07-11 MED ORDER — SODIUM CHLORIDE 0.9 % IJ SOLN: 3.0000 mL | INTRAMUSCULAR | Status: DC | PRN

## 2014-07-11 MED ORDER — LACTATED RINGERS IV SOLN
INTRAVENOUS | Status: DC
  Administered 2014-07-11 (×3): via INTRAVENOUS

## 2014-07-11 MED ORDER — HYDROCODONE-ACETAMINOPHEN 10-325 MG PO TABS
2.0000 | ORAL_TABLET | ORAL | Status: DC | PRN
  Administered 2014-07-11 – 2014-07-12 (×3): 2 via ORAL
  Filled 2014-07-11 (×3): qty 2

## 2014-07-11 MED ORDER — MIDAZOLAM HCL 2 MG/2ML IJ SOLN
INTRAMUSCULAR | Status: AC
  Filled 2014-07-11: qty 2

## 2014-07-11 MED ORDER — PROMETHAZINE HCL 25 MG/ML IJ SOLN
6.2500 mg | INTRAMUSCULAR | Status: DC | PRN
Start: 2014-07-11 — End: 2014-07-11

## 2014-07-11 MED ORDER — ONDANSETRON HCL 4 MG/2ML IJ SOLN
INTRAMUSCULAR | Status: AC
  Filled 2014-07-11: qty 2

## 2014-07-11 MED ORDER — OXYCODONE-ACETAMINOPHEN 5-325 MG PO TABS: 1.0000 | ORAL_TABLET | ORAL | Status: DC | PRN

## 2014-07-11 MED ORDER — SODIUM CHLORIDE 0.9 % IJ SOLN: 3.0000 mL | Freq: Two times a day (BID) | INTRAMUSCULAR | Status: DC

## 2014-07-11 SURGICAL SUPPLY — 80 items
BAG DECANTER FOR FLEXI CONT (MISCELLANEOUS) ×2 IMPLANT
BENZOIN TINCTURE PRP APPL 2/3 (GAUZE/BANDAGES/DRESSINGS) ×4 IMPLANT
BLADE CLIPPER SURG (BLADE) IMPLANT
BLADE SURG 11 STRL SS (BLADE) ×2 IMPLANT
BNDG GAUZE ELAST 4 BULKY (GAUZE/BANDAGES/DRESSINGS) ×2 IMPLANT
BRUSH SCRUB EZ PLAIN DRY (MISCELLANEOUS) ×2 IMPLANT
BUR MATCHSTICK NEURO 3.0 LAGG (BURR) ×2 IMPLANT
BUR PRECISION FLUTE 6.0 (BURR) ×2 IMPLANT
CANISTER SUCT 3000ML PPV (MISCELLANEOUS) ×2 IMPLANT
CAP LOCKING REVERE (Cap) ×8 IMPLANT
CAP LOCKING THREADED (Cap) ×2 IMPLANT
CONT SPEC 4OZ CLIKSEAL STRL BL (MISCELLANEOUS) ×4 IMPLANT
COVER BACK TABLE 60X90IN (DRAPES) ×2 IMPLANT
DECANTER SPIKE VIAL GLASS SM (MISCELLANEOUS) ×2 IMPLANT
DERMABOND ADHESIVE PROPEN (GAUZE/BANDAGES/DRESSINGS) ×1
DERMABOND ADVANCED .7 DNX6 (GAUZE/BANDAGES/DRESSINGS) ×1 IMPLANT
DRAPE C-ARM 42X72 X-RAY (DRAPES) ×2 IMPLANT
DRAPE C-ARMOR (DRAPES) ×2 IMPLANT
DRAPE INCISE IOBAN 66X45 STRL (DRAPES) ×2 IMPLANT
DRAPE LAPAROTOMY 100X72X124 (DRAPES) ×2 IMPLANT
DRAPE POUCH INSTRU U-SHP 10X18 (DRAPES) ×2 IMPLANT
DRAPE PROXIMA HALF (DRAPES) IMPLANT
DRAPE SURG 17X23 STRL (DRAPES) ×4 IMPLANT
DRSG OPSITE 4X5.5 SM (GAUZE/BANDAGES/DRESSINGS) ×2 IMPLANT
DRSG OPSITE POSTOP 4X6 (GAUZE/BANDAGES/DRESSINGS) ×2 IMPLANT
DRSG OPSITE POSTOP 4X8 (GAUZE/BANDAGES/DRESSINGS) ×2 IMPLANT
DURAPREP 26ML APPLICATOR (WOUND CARE) ×2 IMPLANT
ELECT REM PT RETURN 9FT ADLT (ELECTROSURGICAL) ×2
ELECTRODE REM PT RTRN 9FT ADLT (ELECTROSURGICAL) ×1 IMPLANT
EVACUATOR 3/16  PVC DRAIN (DRAIN) ×1
EVACUATOR 3/16 PVC DRAIN (DRAIN) ×1 IMPLANT
GAUZE SPONGE 4X4 12PLY STRL (GAUZE/BANDAGES/DRESSINGS) ×2 IMPLANT
GAUZE SPONGE 4X4 16PLY XRAY LF (GAUZE/BANDAGES/DRESSINGS) ×2 IMPLANT
GLOVE BIO SURGEON STRL SZ 6.5 (GLOVE) ×4 IMPLANT
GLOVE BIO SURGEON STRL SZ7 (GLOVE) ×6 IMPLANT
GLOVE BIO SURGEON STRL SZ8 (GLOVE) ×4 IMPLANT
GLOVE BIOGEL PI IND STRL 6.5 (GLOVE) ×1 IMPLANT
GLOVE BIOGEL PI IND STRL 8 (GLOVE) ×1 IMPLANT
GLOVE BIOGEL PI INDICATOR 6.5 (GLOVE) ×1
GLOVE BIOGEL PI INDICATOR 8 (GLOVE) ×1
GLOVE ECLIPSE 7.5 STRL STRAW (GLOVE) ×2 IMPLANT
GLOVE EXAM NITRILE LRG STRL (GLOVE) IMPLANT
GLOVE EXAM NITRILE MD LF STRL (GLOVE) IMPLANT
GLOVE EXAM NITRILE XL STR (GLOVE) IMPLANT
GLOVE EXAM NITRILE XS STR PU (GLOVE) IMPLANT
GLOVE INDICATOR 8.5 STRL (GLOVE) ×4 IMPLANT
GOWN STRL REUS W/ TWL LRG LVL3 (GOWN DISPOSABLE) ×2 IMPLANT
GOWN STRL REUS W/ TWL XL LVL3 (GOWN DISPOSABLE) ×3 IMPLANT
GOWN STRL REUS W/TWL 2XL LVL3 (GOWN DISPOSABLE) IMPLANT
GOWN STRL REUS W/TWL LRG LVL3 (GOWN DISPOSABLE) ×2
GOWN STRL REUS W/TWL XL LVL3 (GOWN DISPOSABLE) ×3
KIT BASIN OR (CUSTOM PROCEDURE TRAY) ×2 IMPLANT
KIT INFUSE XX SMALL 0.7CC (Orthopedic Implant) ×2 IMPLANT
KIT ROOM TURNOVER OR (KITS) ×2 IMPLANT
LIQUID BAND (GAUZE/BANDAGES/DRESSINGS) ×2 IMPLANT
NEEDLE HYPO 21X1.5 SAFETY (NEEDLE) ×2 IMPLANT
NEEDLE HYPO 25X1 1.5 SAFETY (NEEDLE) ×2 IMPLANT
NS IRRIG 1000ML POUR BTL (IV SOLUTION) ×2 IMPLANT
PACK LAMINECTOMY NEURO (CUSTOM PROCEDURE TRAY) ×2 IMPLANT
PAD ARMBOARD 7.5X6 YLW CONV (MISCELLANEOUS) ×6 IMPLANT
PUTTY BONE DBX 5CC MIX (Putty) ×2 IMPLANT
ROD 40MM SPINAL (Rod) ×2 IMPLANT
ROD CREO 45MM SPINAL (Rod) ×2 IMPLANT
SCREW CREO SHAFT 8.5X40 (Screw) ×1 IMPLANT
SCREW CREO SHAFT 8.5X40MM (Screw) ×1 IMPLANT
SCREW PA THRD CREO TULIP 5.5X4 (Head) ×2 IMPLANT
SPONGE LAP 4X18 X RAY DECT (DISPOSABLE) IMPLANT
SPONGE SURGIFOAM ABS GEL 100 (HEMOSTASIS) ×4 IMPLANT
STRIP CLOSURE SKIN 1/2X4 (GAUZE/BANDAGES/DRESSINGS) ×4 IMPLANT
SUT VIC AB 0 CT1 18XCR BRD8 (SUTURE) ×2 IMPLANT
SUT VIC AB 0 CT1 8-18 (SUTURE) ×2
SUT VIC AB 2-0 CT1 18 (SUTURE) ×6 IMPLANT
SUT VICRYL 4-0 PS2 18IN ABS (SUTURE) ×4 IMPLANT
SYR 20CC LL (SYRINGE) ×2 IMPLANT
SYR 20ML ECCENTRIC (SYRINGE) ×2 IMPLANT
TOWEL OR 17X24 6PK STRL BLUE (TOWEL DISPOSABLE) ×2 IMPLANT
TOWEL OR 17X26 10 PK STRL BLUE (TOWEL DISPOSABLE) ×2 IMPLANT
TRAY FOLEY CATH 14FRSI W/METER (CATHETERS) ×2 IMPLANT
TRAY FOLEY W/METER SILVER 14FR (SET/KITS/TRAYS/PACK) ×2 IMPLANT
WATER STERILE IRR 1000ML POUR (IV SOLUTION) ×2 IMPLANT

## 2014-07-11 NOTE — Transfer of Care (Signed)
Immediate Anesthesia Transfer of Care Note  Patient: Lauren Briggs  Procedure(s) Performed: Procedure(s): Posterior Lateral Fusion - Lumbar five-Sacral one with Iliac Crest Bone Graft (N/A)  Patient Location: PACU  Anesthesia Type:General  Level of Consciousness: awake, oriented, sedated, patient cooperative and responds to stimulation  Airway & Oxygen Therapy: Patient Spontanous Breathing and Patient connected to nasal cannula oxygen  Post-op Assessment: Report given to RN, Post -op Vital signs reviewed and stable, Patient moving all extremities and Patient moving all extremities X 4  Post vital signs: Reviewed and stable  Last Vitals:  Filed Vitals:   07/11/14 1009  BP: 191/87  Pulse:   Temp:   Resp:     Complications: No apparent anesthesia complications

## 2014-07-11 NOTE — Op Note (Signed)
Preoperative diagnosis: Pseudoarthrosis L5-S1  Postoperative diagnosis: Same  Procedure: #1 reexploration of fusion removal of hardware L3-S1 with removal of bilateral L3 screws bilateral L4 screws and the left-sided S1 screw  #2 through a separate incision harvesting of iliac crest bone graft in the left posterior superior iliac spine  #3 replacement of left S1 screw and redo L5-S1 nonsegmental fixation using the 3 previously existing globus Revere 5.5 pedicle screws and one new Creo modular 8 5 x 40 left S1 screw  #4 posterior lateral arthrodesis L5-S1 using iliac crest bone graft mixed with an extra extra small BMP and DBX mix  Surgeon: Dominica Severin Briseidy Spark  Asst.: Jovita Gamma  Anesthesia: Gen.  EBL: Minimal  History of present illness: Patient is very pleasant 59 year old female previously undergone an L3-S1 fusion with what appear to be solid fusions at L3-4 and L4-5 however with worsening left hip pain and a CT scan that showed loosening of her left S1 screw and no evidence of solid fusion at L5-S1. The patient's progression of clinical syndrome failure conservative treatment imaging findings a recommended reexploration of fusion removal of hardware and redo posterior lateral fusion at L5-S1 with replacement of her left S1 screw. I also recommended harvesting iliac crest bone graft. I extensively went over the risks and benefits of this operation with the patient as well as perioperative course expectations of outcome and alternatives of surgery and she understood and agreed to proceed forward.  Operative procedure: Patient brought into the or was induced under general anesthesia positioned prone the Wilson frame her back was prepped and draped in routine sterile fashion. Her old incision was identified and marked out as well as a incision starting 4 fingerbreadths off the midline along the posterior superior iliac spine along the left. First incision was made along the iliac crest that this was  taken down to the posterior superior iliac spine and using the posterior iliac crest was then dissected free using chisels I removed the posterior cortex using gouges I harvested cancellus bone after an adequate amount of cancellus bone been harvested packed this with Gelfoam a tic was hemostasis and copious irrigation was used and then I closed in layers with interrupted Vicryls and a running 4 subcuticular. Dermabond benzo and Steri-Strips were all applied and patient recovered in stable condition. At the end the case all needle counts sponge counts were correct. Then testing the midline incision made this midline incision exposed the old fusion construct from L3-S1 disconnected the cross-link removed all the knots remove the rods tested the screws the 34 and 45 fusions were solid there was no evidence of solid posterior lateral fusion L5-S1 there was motion there the left S1 screw was loose and removed this I also removed the screws at L3 and L4 bilaterally. Then I dissected along the posterior lateral space identified the TPs and the sacral a lot and after copious irrigation I aggressively decorticated the TPs and sacral alar I packed the cancellus iliac crest along the TPS L5 to the sacrum and sacral a lot S1 then a strip of BMP and DBX mix and some additional cancellus chips. Then I replaced the the S1 screw that took out which was a 06/25/1933 with an 08/26/1938. This screw had excellent purchase fluoroscopy confirmed good position of all the implants. I then put rods in place replaced top tightening nuts. Then placed a large Hemovac drain used vancomycin powder and X Perrone the muscle closed the wound in layers with interrupted Vicryls and running  4 subcuticular in the skin benzoin and Dermabond Steri-Strips were applied patient recovered in stable condition. At the end of case on the account sponge counts were correct.

## 2014-07-11 NOTE — Plan of Care (Signed)
Problem: Consults Goal: Diagnosis - Spinal Surgery Outcome: Completed/Met Date Met:  07/11/14 Thoraco/Lumbar Spine Fusion

## 2014-07-11 NOTE — Anesthesia Preprocedure Evaluation (Addendum)
Anesthesia Evaluation  Patient identified by MRN, date of birth, ID band Patient awake    Reviewed: Allergy & Precautions, NPO status , Patient's Chart, lab work & pertinent test results  History of Anesthesia Complications (+) history of anesthetic complications (Hx chipped tooth)  Airway Mallampati: III  TM Distance: >3 FB Neck ROM: Full    Dental   Pulmonary former smoker,  breath sounds clear to auscultation        Cardiovascular negative cardio ROS  Rhythm:Regular Rate:Normal     Neuro/Psych negative neurological ROS     GI/Hepatic negative GI ROS, Neg liver ROS,   Endo/Other  Morbid obesity  Renal/GU negative Renal ROS     Musculoskeletal  (+) Arthritis -,   Abdominal   Peds  Hematology negative hematology ROS (+)   Anesthesia Other Findings   Reproductive/Obstetrics                            Anesthesia Physical Anesthesia Plan  ASA: III  Anesthesia Plan: General   Post-op Pain Management:    Induction: Intravenous  Airway Management Planned: Oral ETT  Additional Equipment:   Intra-op Plan:   Post-operative Plan: Extubation in OR  Informed Consent: I have reviewed the patients History and Physical, chart, labs and discussed the procedure including the risks, benefits and alternatives for the proposed anesthesia with the patient or authorized representative who has indicated his/her understanding and acceptance.   Dental advisory given  Plan Discussed with: CRNA  Anesthesia Plan Comments:         Anesthesia Quick Evaluation

## 2014-07-11 NOTE — H&P (Signed)
Lauren Briggs is an 58 y.o. female.   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 Due to progression of back and predominant left sided buttock pain I recommended redo reexploration of fusion possible removal of hardware L3-L5 with a redo posterior lateral fusion L5-S1. I have extensively gone over the risks and benefits of the operation with the patient as well as perioperative course expectations of outcome and alternatives surgery and she understands and agrees to proceed forward.  Past Medical History  Diagnosis Date  . Low back pain radiating to right leg   . Hyperlipidemia   . Complication of anesthesia     HX Back CHIPPED TOOTH S/P EXTUBATION   . Shortness of breath dyspnea   . Neuromuscular disorder   . Arthritis     Past Surgical History  Procedure Laterality Date  . Nasal sinus surgery      1994   . Appendectomy    . Carpal tunnel release      BIL   . Closed reduction ulnar shaft    . Cesarean section      PARTIAL HYST  . Abdominal hysterectomy      PARTIAL  . Foot surgery      LT FOOT CYST REMOVED   . Back surgery      x3    History reviewed. No pertinent family history. Social History:  reports that she has quit smoking. Her smoking use included Cigarettes. She quit smokeless tobacco use about 35 years ago. She reports that she does not drink alcohol or use illicit drugs.  Allergies:  Allergies  Allergen Reactions  . Propoxyphene Hcl Palpitations    Medications Prior to Admission  Medication Sig Dispense Refill  . Bee Pollen 580 MG CAPS Take 580 mg by mouth 2 (two) times daily.    Marland Kitchen Bioflavonoid Products (ESTER-C) TABS Take 1 tablet by mouth daily.    Marland Kitchen CINNAMON PO Take 1,000 mg by mouth 2 (two)  times daily.    . clobetasol cream (TEMOVATE) 6.30 % Apply 1 application topically at bedtime. Apply to vaginal area    . Cranberry-Vitamin C-Vitamin E (CRANBERRY PLUS VITAMIN C) 4200-20-3 MG-MG-UNIT CAPS Take 1 capsule by mouth 2 (two) times daily.    . DULoxetine (CYMBALTA) 60 MG capsule TAKE ONE CAPSULE BY MOUTH DAILY AFTER SUPPER  1  . estradiol (ESTRACE) 0.5 MG tablet Take 0.5 mg by mouth daily.    . Estradiol (VAGIFEM) 10 MCG TABS vaginal tablet Place 10 mcg vaginally every Monday, Wednesday, and Friday.    . ezetimibe-simvastatin (VYTORIN) 10-20 MG per tablet Take 1 tablet by mouth at bedtime. (Patient taking differently: Take 1 tablet by mouth daily after supper. ) 90 tablet 3  . ezetimibe-simvastatin (VYTORIN) 10-20 MG per tablet Take 1 tablet by mouth daily after supper.    . Flaxseed, Linseed, (FLAX SEED OIL PO) Take 1,200 mg by mouth 2 (two) times daily.    Marland Kitchen HYDROcodone-acetaminophen (NORCO) 10-325 MG per tablet TAKE ONE TABLET BY MOUTH EVERY 4-6 HOURS AS NEEDED FOR PAIN  0  . Multiple Vitamin (MULTIVITAMIN WITH MINERALS) TABS Take 1 tablet by mouth daily after supper. Women's 50 plus    . Omega-3 Fatty Acids (FISH OIL) 1200 MG  CAPS Take 2,400 mg by mouth 2 (two) times daily.     . Omega-3 Fatty Acids (SALMON OIL-1000 PO) Take 1,000 mg by mouth 2 (two) times daily.    . valACYclovir (VALTREX) 1000 MG tablet Take 1,000 mg by mouth daily after supper.     . fluticasone (FLONASE) 50 MCG/ACT nasal spray Place 2 sprays into both nostrils daily. (Patient not taking: Reported on 07/01/2014) 16 g 0    No results found for this or any previous visit (from the past 48 hour(s)). No results found.  Review of Systems  Constitutional: Negative.   HENT: Negative.   Eyes: Negative.   Respiratory: Negative.   Cardiovascular: Negative.   Gastrointestinal: Negative.   Genitourinary: Negative.   Musculoskeletal: Positive for myalgias and back pain.  Skin: Negative.   Neurological: Positive for  tingling and sensory change.  Psychiatric/Behavioral: Negative.     Blood pressure 191/87, pulse 87, temperature 97.8 F (36.6 C), temperature source Oral, resp. rate 20, weight 108.41 kg (239 lb), SpO2 98 %. Physical Exam  Constitutional: She is oriented to person, place, and time. She appears well-developed and well-nourished.  HENT:  Head: Normocephalic.  Neck: Normal range of motion.  Respiratory: Effort normal.  GI: Soft. Bowel sounds are normal.  Neurological: She is alert and oriented to person, place, and time.  Strength is 5 out of 5 in her iliopsoas, quads, and she's, gastrocs, into tibialis, and EHL.  Skin: Skin is warm and dry.     Assessment/Plan 59 year old female presents for reexploration of fusion removal of hardware and redo posterior lateral fusion at L5-S1.  Lauren Briggs P 07/11/2014, 12:43 PM

## 2014-07-11 NOTE — Anesthesia Procedure Notes (Signed)
Procedure Name: Intubation Date/Time: 07/11/2014 1:43 PM Performed by: Jacquiline Doe A Pre-anesthesia Checklist: Patient identified, Timeout performed, Emergency Drugs available, Suction available and Patient being monitored Patient Re-evaluated:Patient Re-evaluated prior to inductionOxygen Delivery Method: Circle system utilized Preoxygenation: Pre-oxygenation with 100% oxygen Intubation Type: IV induction and Cricoid Pressure applied Ventilation: Mask ventilation without difficulty Laryngoscope Size: Mac and 4 Grade View: Grade I Tube type: Oral Tube size: 7.5 mm Number of attempts: 1 Airway Equipment and Method: Stylet Placement Confirmation: ETT inserted through vocal cords under direct vision,  breath sounds checked- equal and bilateral and positive ETCO2 Secured at: 22 cm Tube secured with: Tape Dental Injury: Teeth and Oropharynx as per pre-operative assessment

## 2014-07-12 MED ORDER — DULOXETINE HCL 30 MG PO CPEP: 60.0000 mg | ORAL_CAPSULE | Freq: Every day | ORAL | Status: DC

## 2014-07-12 MED ORDER — HYDROCODONE-ACETAMINOPHEN 10-325 MG PO TABS: 2.0000 | ORAL_TABLET | ORAL | Status: DC | PRN

## 2014-07-12 NOTE — Discharge Summary (Signed)
Physician Discharge Summary  Patient ID: Lauren Briggs MRN: [id number] DOB/AGE: 1955/09/11 59 y.o.  Admit date: 07/11/2014 Discharge date: 07/12/2014  Admission Diagnoses: Pseudoarthrosis L5-S1  Discharge Diagnoses: Same Active Problems:   Pseudoarthrosis of lumbar spine   Discharged Condition: good  Hospital Course: Patient underwent reexploration of fusion removal of hardware and revision of posterior lateral fusion at L5-S1 with iliac crest bone graft. Postoperative patient did very well come in for on the floor she was angling and voiding spontaneously tolerating regular diet was stable for discharge home. She'll be discharged her scheduled follow-up in approximately 2 weeks.  Consults: Significant Diagnostic Studies: Treatments: Redo posterior lateral fusion L5-S1  Discharge Exam: Blood pressure 121/55, pulse 98, temperature 98.1 F (36.7 C), temperature source Oral, resp. rate 19, weight 108.41 kg (239 lb), SpO2 97 %. strength 5 out of 5 wound clean dry and intact   Disposition: Home    Medication List    TAKE these medications        Bee Pollen 580 MG Caps  Take 580 mg by mouth 2 (two) times daily.     CINNAMON PO  Take 1,000 mg by mouth 2 (two) times daily.     clobetasol cream 0.05 %  Commonly known as:  TEMOVATE  Apply 1 application topically at bedtime. Apply to vaginal area     CRANBERRY PLUS VITAMIN C 4200-20-3 MG-MG-UNIT Caps  Generic drug:  Cranberry-Vitamin C-Vitamin E  Take 1 capsule by mouth 2 (two) times daily.     DULoxetine 60 MG capsule  Commonly known as:  CYMBALTA  TAKE ONE CAPSULE BY MOUTH DAILY AFTER SUPPER     DULoxetine 30 MG capsule  Commonly known as:  CYMBALTA  Take 2 capsules (60 mg total) by mouth daily.     ESTER-C Tabs  Take 1 tablet by mouth daily.     estradiol 0.5 MG tablet  Commonly known as:  ESTRACE  Take 0.5 mg by mouth daily.     ezetimibe-simvastatin 10-20 MG per tablet  Commonly known as:  VYTORIN  Take 1  tablet by mouth daily after supper.     ezetimibe-simvastatin 10-20 MG per tablet  Commonly known as:  VYTORIN  Take 1 tablet by mouth at bedtime.     SALMON OIL-1000 PO  Take 1,000 mg by mouth 2 (two) times daily.     Fish Oil 1200 MG Caps  Take 2,400 mg by mouth 2 (two) times daily.     FLAX SEED OIL PO  Take 1,200 mg by mouth 2 (two) times daily.     HYDROcodone-acetaminophen 10-325 MG per tablet  Commonly known as:  NORCO  TAKE ONE TABLET BY MOUTH EVERY 4-6 HOURS AS NEEDED FOR PAIN     HYDROcodone-acetaminophen 10-325 MG per tablet  Commonly known as:  NORCO  Take 2 tablets by mouth every 4 (four) hours as needed for moderate pain.     multivitamin with minerals Tabs tablet  Take 1 tablet by mouth daily after supper. Women's 50 plus     VAGIFEM 10 MCG Tabs vaginal tablet  Generic drug:  Estradiol  Place 10 mcg vaginally every Monday, Wednesday, and Friday.     valACYclovir 1000 MG tablet  Commonly known as:  VALTREX  Take 1,000 mg by mouth daily after supper.      ASK your doctor about these medications        fluticasone 50 MCG/ACT nasal spray  Commonly known as:  FLONASE  Place 2 sprays  into both nostrils daily.           Follow-up Information    Follow up with [facility] P, MD.   Specialty:  Neurosurgery   Contact information:   [address] [place] [address] [phone]       Signed: Elaina Hoops 07/12/2014, 7:25 AM

## 2014-07-12 NOTE — Progress Notes (Signed)
Physical Therapy Evaluation and Discharge Patient Details Name: Lauren Briggs MRN: [id number] DOB: March 15, 1955 Today's Date: 07/12/2014   History of Present Illness  Pt is a 59 y/o female admitted s/p removal of hardware L3-S1, iliac crest bone graft for posteriolateral arthrodesis L5-S1, replacement of Left S1 screw and redo of L5-S1 nonsegmental fixation.    Clinical Impression  Patient evaluated by Physical Therapy with no further acute PT needs identified. All education has been completed and the patient has no further questions. At the time of PT eval pt was mod I for all mobility and was able to recall 3/3 back precautions without cueing. See below for any follow-up Physial Therapy or equipment needs. PT is signing off. Thank you for this referral.     Follow Up Recommendations No PT follow up    Equipment Recommendations  None recommended by PT    Recommendations for Other Services       Precautions / Restrictions Precautions Precautions: Fall;Back Precaution Comments: Pt able to recall 3/3 back precautions Required Braces or Orthoses: Spinal Brace Spinal Brace: Lumbar corset;Applied in sitting position Restrictions Weight Bearing Restrictions: No      Mobility  Bed Mobility Overal bed mobility: Modified Independent             General bed mobility comments: Demonstrated proper log roll technique with no need for assistance or cues for technique.   Transfers Overall transfer level: Modified independent Equipment used: None             General transfer comment: No physical assist required.   Ambulation/Gait Ambulation/Gait assistance: Modified independent (Device/Increase time) Ambulation Distance (Feet): 300 Feet Assistive device: None Gait Pattern/deviations: Step-through pattern;Decreased stride length;Decreased weight shift to left Gait velocity: Decreased Gait velocity interpretation: Below normal speed for age/gender General Gait Details: No  unteadiness noted. Pt did not require physical assistance for safe ambulation.   Stairs            Wheelchair Mobility    Modified Rankin (Stroke Patients Only)       Balance Overall balance assessment: No apparent balance deficits (not formally assessed)                                           Pertinent Vitals/Pain Pain Assessment: 0-10 Pain Score: 6  Pain Location: Incisional pain Pain Descriptors / Indicators: Operative site guarding Pain Intervention(s): Limited activity within patient's tolerance;Monitored during session;Repositioned;RN gave pain meds during session    [place] [facility] expects to be discharged to:: Private residence Living Arrangements: Spouse/significant other Available Help at Discharge: Family;Available 24 hours/day Type of Home: House Home Access: Stairs to enter   [employer] of Steps: 3 Home Layout: Two level;Able to live on main level with bedroom/bathroom Home Equipment: Gilford Rile - 2 wheels;Shower seat      Prior Function Level of Independence: Independent               Hand Dominance   Dominant Hand: Right    Extremity/Trunk Assessment   Upper Extremity Assessment: Overall WFL for tasks assessed           Lower Extremity Assessment: LLE deficits/detail   LLE Deficits / Details: Acute pain in L hip at area of bone graft. Pain in posterior thigh consistent with lingering nerve pain.   Cervical / Trunk Assessment: Normal  Communication   Communication: No difficulties  Cognition Arousal/Alertness: Awake/alert Behavior During Therapy: WFL for tasks assessed/performed Overall Cognitive Status: Within Functional Limits for tasks assessed                      General Comments      Exercises        Assessment/Plan    PT Assessment Patent does not need any further PT services  PT Diagnosis Difficulty walking;Acute pain   PT Problem List    PT Treatment  Interventions     PT Goals (Current goals can be found in the Care Plan section) Acute Rehab PT Goals PT Goal Formulation: All assessment and education complete, DC therapy    Frequency     Barriers to discharge        Co-evaluation               End of Session Equipment Utilized During Treatment: Back brace Activity Tolerance: Patient tolerated treatment well;No increased pain Patient left: Other (comment) (Sitting EOB to eat breakfast with husband present) Nurse Communication: Mobility status         Time: 7017-7939 PT Time Calculation (min) (ACUTE ONLY): 16 min   Charges:   PT Evaluation $Initial PT Evaluation Tier I: 1 Procedure     PT G Codes:        Rolinda Roan July 31, 2014, 9:05 AM   Rolinda Roan, PT, DPT Acute Rehabilitation Services Pager: [phone]

## 2014-07-12 NOTE — Progress Notes (Signed)
Patient ID: Lauren Briggs, female   DOB: [birth year], 59 y.o.   MRN: [id number] Doing well no leg pain  Strength out of 5 wound clean dry and intact  Discharge home

## 2014-07-12 NOTE — Progress Notes (Signed)
OT Cancellation Note  Patient Details Name: Lauren Briggs MRN: [id number] DOB: [birth year]   Cancelled Treatment:    Reason Eval/Treat Not Completed: OT screened, no needs identified, will sign off. This is the pt's 4 back surgery and she reports that she does not have any issues or concerns about BADLs.  Almon Register [phone] 07/12/2014, 8:58 AM

## 2014-07-12 NOTE — Care Management (Signed)
Utilization review completed by Aloise N. Jude Naclerio, RN BSN 

## 2014-07-12 NOTE — Progress Notes (Signed)
Patient alert and oriented, mae's well, voiding adequate amount of urine, swallowing without difficulty, no c/o pain. Patient discharged home with family. Script and discharged instructions given to patient. Patient and family stated understanding of d/c instructions given and has an appointment with MD. 

## 2014-07-12 NOTE — Discharge Instructions (Signed)

## 2014-07-13 ENCOUNTER — Encounter (HOSPITAL_COMMUNITY): Payer: Self-pay | Admitting: Neurosurgery

## 2014-07-14 ENCOUNTER — Encounter (HOSPITAL_COMMUNITY): Payer: Self-pay | Admitting: Neurosurgery

## 2014-07-15 ENCOUNTER — Encounter (HOSPITAL_COMMUNITY): Payer: Self-pay | Admitting: Neurosurgery

## 2014-07-15 NOTE — Anesthesia Postprocedure Evaluation (Signed)
  Anesthesia Post-op Note  Patient: Lauren Briggs  Procedure(s) Performed: Procedure(s): Posterior Lateral Fusion - Lumbar five-Sacral one with Iliac Crest Bone Graft (N/A)  Patient Location: PACU  Anesthesia Type:General  Level of Consciousness: awake, alert  and oriented  Airway and Oxygen Therapy: Patient Spontanous Breathing  Post-op Pain: mild  Post-op Assessment: Post-op Vital signs reviewed   LLE Sensation: Full sensation   RLE Sensation: Full sensation      Post-op Vital Signs: Reviewed  Last Vitals:  Filed Vitals:   07/12/14 1159  BP: 153/67  Pulse: 83  Temp: 36.7 C  Resp: 20    Complications: No apparent anesthesia complications

## 2014-07-16 ENCOUNTER — Other Ambulatory Visit: Payer: Self-pay | Admitting: Family Medicine

## 2014-08-02 ENCOUNTER — Encounter: Payer: BC Managed Care – HMO | Admitting: Family Medicine

## 2014-08-09 ENCOUNTER — Other Ambulatory Visit: Payer: Self-pay | Admitting: Family Medicine

## 2014-08-09 DIAGNOSIS — Z79899 Other long term (current) drug therapy: Secondary | ICD-10-CM

## 2014-08-09 DIAGNOSIS — I10 Essential (primary) hypertension: Secondary | ICD-10-CM

## 2014-08-09 DIAGNOSIS — Z0000 Encounter for general adult medical examination without abnormal findings: Secondary | ICD-10-CM

## 2014-08-09 DIAGNOSIS — E785 Hyperlipidemia, unspecified: Secondary | ICD-10-CM

## 2014-08-15 ENCOUNTER — Encounter: Payer: Self-pay | Admitting: Family Medicine

## 2014-08-15 ENCOUNTER — Ambulatory Visit (INDEPENDENT_AMBULATORY_CARE_PROVIDER_SITE_OTHER): Payer: 59 | Admitting: Family Medicine

## 2014-08-15 VITALS — BP 130/86 | HR 78 | Temp 99.0°F | Resp 18 | Ht 65.0 in | Wt 237.0 lb

## 2014-08-15 DIAGNOSIS — J208 Acute bronchitis due to other specified organisms: Secondary | ICD-10-CM | POA: Diagnosis not present

## 2014-08-15 MED ORDER — AZITHROMYCIN 250 MG PO TABS: ORAL_TABLET | ORAL | Status: DC

## 2014-08-15 MED ORDER — BENZONATATE 100 MG PO CAPS: 100.0000 mg | ORAL_CAPSULE | Freq: Two times a day (BID) | ORAL | Status: DC | PRN

## 2014-08-15 NOTE — Progress Notes (Signed)
Subjective:    Patient ID: Lauren Briggs, female    DOB: Jan 20, 1956, 59 y.o.   MRN: [id number]  HPI  Patient was recently told that she has hypertension well she was in the hospital for surgery. I personally checked her blood pressure in both arms and found it to range between 138 140/78-80. My nurse checked her blood pressure found to be 130/86. Although these numbers are borderline, they do not want blood pressure medication at the present time. Patient is also concerned because she has chest congestion and cough. Her sister was diagnosed with bronchitis. She developed the symptoms shortly after her sister. She has nonproductive cough. She denies any fevers chills or shortness of breath. However she recently had back surgery she is concerned because the coughing is aggravating her back. On examination today she does have rhonchorous breath sounds left greater than right. There are no expiratory wheezes. I can appreciate no crackles Past Medical History  Diagnosis Date  . Low back pain radiating to right leg   . Hyperlipidemia   . Complication of anesthesia     HX Back CHIPPED TOOTH S/P EXTUBATION   . Shortness of breath dyspnea   . Neuromuscular disorder   . Arthritis    Past Surgical History  Procedure Laterality Date  . Nasal sinus surgery      1994   . Appendectomy    . Carpal tunnel release      BIL   . Closed reduction ulnar shaft    . Cesarean section      PARTIAL HYST  . Abdominal hysterectomy      PARTIAL  . Foot surgery      LT FOOT CYST REMOVED   . Back surgery      x3   Current Outpatient Prescriptions on File Prior to Visit  Medication Sig Dispense Refill  . Bee Pollen 580 MG CAPS Take 580 mg by mouth 2 (two) times daily.    Marland Kitchen Bioflavonoid Products (ESTER-C) TABS Take 1 tablet by mouth daily.    Marland Kitchen CINNAMON PO Take 1,000 mg by mouth 2 (two) times daily.    . clobetasol cream (TEMOVATE) 3.81 % Apply 1 application topically at bedtime. Apply to vaginal area    .  Cranberry-Vitamin C-Vitamin E (CRANBERRY PLUS VITAMIN C) 4200-20-3 MG-MG-UNIT CAPS Take 1 capsule by mouth 2 (two) times daily.    . DULoxetine (CYMBALTA) 60 MG capsule TAKE ONE CAPSULE BY MOUTH DAILY AFTER SUPPER  1  . estradiol (ESTRACE) 0.5 MG tablet Take 0.5 mg by mouth daily.    . Flaxseed, Linseed, (FLAX SEED OIL PO) Take 1,200 mg by mouth 2 (two) times daily.    . Multiple Vitamin (MULTIVITAMIN WITH MINERALS) TABS Take 1 tablet by mouth daily after supper. Women's 50 plus    . Omega-3 Fatty Acids (SALMON OIL-1000 PO) Take 1,000 mg by mouth 2 (two) times daily.    . valACYclovir (VALTREX) 1000 MG tablet Take 1,000 mg by mouth daily after supper.     Marland Kitchen VYTORIN 10-20 MG per tablet TAKE 1 TABLET BY MOUTH AT BEDTIME 90 tablet 3   No current facility-administered medications on file prior to visit.   Allergies  Allergen Reactions  . Propoxyphene Hcl Palpitations   History   Social History  . Marital Status: Married    Spouse Name: N/A  . Number of Children: N/A  . Years of Education: N/A   Occupational History  . Not on file.   Social History  Main Topics  . Smoking status: Former Smoker    Types: Cigarettes  . Smokeless tobacco: Former Systems developer    Quit date: 01/22/1979  . Alcohol Use: No  . Drug Use: No  . Sexual Activity: Not on file   Other Topics Concern  . Not on file   Social History Narrative     Review of Systems  All other systems reviewed and are negative.      Objective:   Physical Exam  Constitutional: She appears well-developed and well-nourished.  Cardiovascular: Normal rate, regular rhythm and normal heart sounds.   No murmur heard. Pulmonary/Chest: Effort normal and breath sounds normal. No respiratory distress. She has no wheezes. She has no rales.  Abdominal: Soft. Bowel sounds are normal.  Vitals reviewed.         Assessment & Plan:  Acute bronchitis due to other specified organisms - Plan: azithromycin (ZITHROMAX) 250 MG tablet  I  believe the patient does have bronchitis but I believe it is viral bronchitis. I explained this to the patient as best I could. I recommended Mucinex DM for coughing. I recommended Tessalon Perles for coughing as well. Patient would like an anabiotic. I explained an anabolic will not help a virus. I did give the patient prescription for an antibiotic strict instructions not to fill unless she developed a high fever or  A cough productive of purulent sputum. She agrees and will not take antibiotic unless she develops the symptoms.

## 2014-09-01 ENCOUNTER — Ambulatory Visit (INDEPENDENT_AMBULATORY_CARE_PROVIDER_SITE_OTHER): Payer: 59 | Admitting: Family Medicine

## 2014-09-01 ENCOUNTER — Encounter: Payer: Self-pay | Admitting: Family Medicine

## 2014-09-01 ENCOUNTER — Telehealth: Payer: Self-pay | Admitting: Family Medicine

## 2014-09-01 VITALS — BP 126/70 | HR 76 | Temp 98.4°F | Resp 16 | Ht 65.0 in | Wt 238.0 lb

## 2014-09-01 DIAGNOSIS — Z0000 Encounter for general adult medical examination without abnormal findings: Secondary | ICD-10-CM | POA: Diagnosis not present

## 2014-09-01 DIAGNOSIS — E785 Hyperlipidemia, unspecified: Secondary | ICD-10-CM | POA: Diagnosis not present

## 2014-09-01 DIAGNOSIS — Z23 Encounter for immunization: Secondary | ICD-10-CM

## 2014-09-01 MED ORDER — ATORVASTATIN CALCIUM 40 MG PO TABS: 40.0000 mg | ORAL_TABLET | Freq: Every day | ORAL | Status: DC

## 2014-09-01 MED ORDER — EZETIMIBE-SIMVASTATIN 10-20 MG PO TABS: 1.0000 | ORAL_TABLET | Freq: Every day | ORAL | Status: DC

## 2014-09-01 NOTE — Progress Notes (Signed)
Subjective:    Patient ID: Lauren Briggs, female    DOB: [birth year], 59 y.o.   MRN: [id number]  HPI Patient is here today for complete physical exam. Her back pain is slowly improving after her most recent lumbar surgery. She is still in a back brace and has not yet to be cleared by Dr. Saintclair Halsted.  Patient sees a gynecologist every year at [facility]. This is scheduled for later this month. They perform her Pap smear as well as her mammogram there. Her last colonoscopy was 2009 and is not due again until 2019. Immunizations are up-to-date except for a tetanus shot. Otherwise the patient is doing well with no concerns. Past Medical History  Diagnosis Date  . Low back pain radiating to right leg   . Hyperlipidemia   . Complication of anesthesia     HX Back CHIPPED TOOTH S/P EXTUBATION   . Shortness of breath dyspnea   . Neuromuscular disorder   . Arthritis    Past Surgical History  Procedure Laterality Date  . Nasal sinus surgery      1994   . Appendectomy    . Carpal tunnel release      BIL   . Closed reduction ulnar shaft    . Cesarean section      PARTIAL HYST  . Abdominal hysterectomy      PARTIAL  . Foot surgery      LT FOOT CYST REMOVED   . Back surgery      x3   Current Outpatient Prescriptions on File Prior to Visit  Medication Sig Dispense Refill  . Bee Pollen 580 MG CAPS Take 580 mg by mouth 2 (two) times daily.    Marland Kitchen Bioflavonoid Products (ESTER-C) TABS Take 1 tablet by mouth daily.    Marland Kitchen CINNAMON PO Take 1,000 mg by mouth 2 (two) times daily.    . clobetasol cream (TEMOVATE) 0.86 % Apply 1 application topically at bedtime. Apply to vaginal area    . Cranberry-Vitamin C-Vitamin E (CRANBERRY PLUS VITAMIN C) 4200-20-3 MG-MG-UNIT CAPS Take 1 capsule by mouth 2 (two) times daily.    . DULoxetine (CYMBALTA) 60 MG capsule TAKE ONE CAPSULE BY MOUTH DAILY AFTER SUPPER  1  . estradiol (ESTRACE) 0.5 MG tablet Take 0.5 mg by mouth daily.    . Flaxseed, Linseed, (FLAX SEED OIL PO)  Take 1,200 mg by mouth 2 (two) times daily.    Marland Kitchen HYDROcodone-acetaminophen (NORCO) 7.5-325 MG per tablet TK 1 TABLET PO EVERY 6 HOURS AS NEEDED FOR PAIN  0  . Multiple Vitamin (MULTIVITAMIN WITH MINERALS) TABS Take 1 tablet by mouth daily after supper. Women's 50 plus    . Omega-3 Fatty Acids (SALMON OIL-1000 PO) Take 1,000 mg by mouth 2 (two) times daily.    Marland Kitchen VAGIFEM 10 MCG TABS vaginal tablet PLACE 1 TABLET VAGINALLY THREE TIMES WEEKLY  1  . valACYclovir (VALTREX) 1000 MG tablet Take 1,000 mg by mouth daily after supper.      No current facility-administered medications on file prior to visit.   Allergies  Allergen Reactions  . Propoxyphene Hcl Palpitations   Social History   Social History  . Marital Status: Married    Spouse Name: N/A  . Number of Children: N/A  . Years of Education: N/A   Occupational History  . Not on file.   Social History Main Topics  . Smoking status: Former Smoker    Types: Cigarettes  . Smokeless tobacco: Former Systems developer  Quit date: 01/22/1979  . Alcohol Use: No  . Drug Use: No  . Sexual Activity: Not on file   Other Topics Concern  . Not on file   Social History Narrative   No family history on file.    Review of Systems  All other systems reviewed and are negative.      Objective:   Physical Exam  Constitutional: She is oriented to person, place, and time. She appears well-developed and well-nourished. No distress.  HENT:  Head: Normocephalic and atraumatic.  Right Ear: External ear normal.  Left Ear: External ear normal.  Nose: Nose normal.  Mouth/Throat: Oropharynx is clear and moist. No oropharyngeal exudate.  Eyes: Conjunctivae and EOM are normal. Pupils are equal, round, and reactive to light. Right eye exhibits no discharge. Left eye exhibits no discharge. No scleral icterus.  Neck: Normal range of motion. Neck supple. No JVD present. No tracheal deviation present. No thyromegaly present.  Cardiovascular: Normal rate,  regular rhythm, normal heart sounds and intact distal pulses.  Exam reveals no gallop and no friction rub.   No murmur heard. Pulmonary/Chest: Effort normal and breath sounds normal. No stridor. No respiratory distress. She has no wheezes. She has no rales. She exhibits no tenderness.  Abdominal: Soft. Bowel sounds are normal. She exhibits no distension and no mass. There is no tenderness. There is no rebound and no guarding.  Musculoskeletal: Normal range of motion. She exhibits no edema or tenderness.  Lymphadenopathy:    She has no cervical adenopathy.  Neurological: She is alert and oriented to person, place, and time. She has normal reflexes. She displays normal reflexes. No cranial nerve deficit. She exhibits normal muscle tone. Coordination normal.  Skin: Skin is warm. No rash noted. She is not diaphoretic. No erythema. No pallor.  Psychiatric: She has a normal mood and affect. Her behavior is normal. Judgment and thought content normal.  Vitals reviewed.         Assessment & Plan:  Routine general medical examination at a health care facility - Plan: CBC with Differential/Platelet, COMPLETE METABOLIC PANEL WITH GFR, Lipid panel, TSH  HLD (hyperlipidemia) - Plan: ezetimibe-simvastatin (VYTORIN) 10-20 MG per tablet  Patient's physical exam today is normal. Blood pressure is excellent. Cancer screening is up-to-date. Immunizations are up-to-date. Patient will receive the tetanus vaccine today. I will check a CBC, CMP, fasting lipid panel, and a TSH. I did refill the patient's medication pending the results of her lipid panel.

## 2014-09-01 NOTE — Telephone Encounter (Signed)
Switch to lipitor 40 mg poqday

## 2014-09-01 NOTE — Telephone Encounter (Signed)
New RX to pharmacy.  Left patient message about med change

## 2014-09-01 NOTE — Addendum Note (Signed)
Addended by: Shary Decamp B on: 09/01/2014 09:51 AM   Modules accepted: Orders

## 2014-09-01 NOTE — Telephone Encounter (Signed)
Pharmacy says Vytorin not covered under insurance.  Please advise?

## 2014-09-02 LAB — COMPLETE METABOLIC PANEL WITH GFR
ALT: 27 U/L (ref 6–29)
AST: 27 U/L (ref 10–35)
Albumin: 4 g/dL (ref 3.6–5.1)
Alkaline Phosphatase: 61 U/L (ref 33–130)
BUN: 21 mg/dL (ref 7–25)
CO2: 25 mmol/L (ref 20–31)
Calcium: 9 mg/dL (ref 8.6–10.4)
Chloride: 102 mmol/L (ref 98–110)
Creat: 0.68 mg/dL (ref 0.50–1.05)
GFR, Est African American: >89 mL/min (ref >=60)
GFR, Est Non African American: >89 mL/min (ref >=60)
Glucose, Bld: 95 mg/dL (ref 70–99)
Potassium: 4.3 mmol/L (ref 3.5–5.3)
Sodium: 138 mmol/L (ref 135–146)
Total Bilirubin: 0.5 mg/dL (ref 0.2–1.2)
Total Protein: 6.6 g/dL (ref 6.1–8.1)

## 2014-09-02 LAB — CBC WITH DIFFERENTIAL/PLATELET
Basophils Absolute: 0.1 10*3/uL (ref 0.0–0.1)
Basophils Relative: 1 % (ref 0–1)
Eosinophils Absolute: 0.2 10*3/uL (ref 0.0–0.7)
Eosinophils Relative: 4 % (ref 0–5)
HCT: 37.5 % (ref 36.0–46.0)
Hemoglobin: 12.2 g/dL (ref 12.0–15.0)
Lymphocytes Relative: 37 % (ref 12–46)
Lymphs Abs: 2.1 10*3/uL (ref 0.7–4.0)
MCH: 30.2 pg (ref 26.0–34.0)
MCHC: 32.5 g/dL (ref 30.0–36.0)
MCV: 92.8 fL (ref 78.0–100.0)
MPV: 8.8 fL (ref 8.6–12.4)
Monocytes Absolute: 0.3 10*3/uL (ref 0.1–1.0)
Monocytes Relative: 6 % (ref 3–12)
Neutro Abs: 3 10*3/uL (ref 1.7–7.7)
Neutrophils Relative %: 52 % (ref 43–77)
Platelets: 315 10*3/uL (ref 150–400)
RBC: 4.04 MIL/uL (ref 3.87–5.11)
RDW: 14.8 % (ref 11.5–15.5)
WBC: 5.8 10*3/uL (ref 4.0–10.5)

## 2014-09-02 LAB — TSH: TSH: 1.06 u[IU]/mL (ref 0.350–4.500)

## 2014-09-02 LAB — LIPID PANEL
Cholesterol: 220 mg/dL — ABNORMAL HIGH (ref 125–200)
HDL: 54 mg/dL
LDL Cholesterol: 111 mg/dL
Total CHOL/HDL Ratio: 4.1 ratio
Triglycerides: 275 mg/dL — ABNORMAL HIGH
VLDL: 55 mg/dL — ABNORMAL HIGH

## 2014-09-05 ENCOUNTER — Encounter: Payer: Self-pay | Admitting: Family Medicine

## 2014-11-04 ENCOUNTER — Ambulatory Visit (INDEPENDENT_AMBULATORY_CARE_PROVIDER_SITE_OTHER): Payer: 59 | Admitting: Family Medicine

## 2014-11-04 ENCOUNTER — Encounter: Payer: Self-pay | Admitting: Family Medicine

## 2014-11-04 VITALS — BP 154/94 | HR 76 | Temp 98.3°F | Resp 20 | Wt 238.0 lb

## 2014-11-04 DIAGNOSIS — M778 Other enthesopathies, not elsewhere classified: Secondary | ICD-10-CM | POA: Diagnosis not present

## 2014-11-04 NOTE — Progress Notes (Signed)
Subjective:    Patient ID: Lauren Briggs, female    DOB: 08/15/1955, 59 y.o.   MRN: [id number]  HPI  Patient has significant pain in her right wrist. Recently she was helping clean out her mother's home moving a lot of boxes. S1 the pain developed. She went to an urgent care where x-rays were reportedly negative for any fracture. She is exquisitely tender over the dorsum of her right wrist along the body of the first metacarpal and also tender in the anatomic snuffbox. She has a positive Finkelstein maneuver. She has significant pain with ulnar  flexion of the wrist.  Given the normal x-rays, this is concerning for de Quervain's tenosynovitis. She has been wearing a thumb spica splint without relief. Past Medical History  Diagnosis Date  . Low back pain radiating to right leg   . Hyperlipidemia   . Complication of anesthesia     HX Back CHIPPED TOOTH S/P EXTUBATION   . Shortness of breath dyspnea   . Neuromuscular disorder ([place])   . Arthritis    Past Surgical History  Procedure Laterality Date  . Nasal sinus surgery      1994   . Appendectomy    . Carpal tunnel release      BIL   . Closed reduction ulnar shaft    . Cesarean section      PARTIAL HYST  . Abdominal hysterectomy      PARTIAL  . Foot surgery      LT FOOT CYST REMOVED   . Back surgery      x3   Current Outpatient Prescriptions on File Prior to Visit  Medication Sig Dispense Refill  . atorvastatin (LIPITOR) 40 MG tablet Take 1 tablet (40 mg total) by mouth daily at 6 PM. 90 tablet 1  . Bee Pollen 580 MG CAPS Take 580 mg by mouth 2 (two) times daily.    Marland Kitchen Bioflavonoid Products (ESTER-C) TABS Take 1 tablet by mouth daily.    Marland Kitchen CINNAMON PO Take 1,000 mg by mouth 2 (two) times daily.    . clobetasol cream (TEMOVATE) 0.53 % Apply 1 application topically at bedtime. Apply to vaginal area    . Cranberry-Vitamin C-Vitamin E (CRANBERRY PLUS VITAMIN C) 4200-20-3 MG-MG-UNIT CAPS Take 1 capsule by mouth 2 (two) times daily.      . DULoxetine (CYMBALTA) 60 MG capsule TAKE ONE CAPSULE BY MOUTH DAILY AFTER SUPPER  1  . Flaxseed, Linseed, (FLAX SEED OIL PO) Take 1,200 mg by mouth 2 (two) times daily.    . Multiple Vitamin (MULTIVITAMIN WITH MINERALS) TABS Take 1 tablet by mouth daily after supper. Women's 50 plus    . Omega-3 Fatty Acids (SALMON OIL-1000 PO) Take 1,000 mg by mouth 2 (two) times daily.    Marland Kitchen VAGIFEM 10 MCG TABS vaginal tablet PLACE 1 TABLET VAGINALLY THREE TIMES WEEKLY  1  . valACYclovir (VALTREX) 1000 MG tablet Take 1,000 mg by mouth daily after supper.     Marland Kitchen estradiol (ESTRACE) 0.5 MG tablet Take 0.5 mg by mouth daily.    Marland Kitchen HYDROcodone-acetaminophen (NORCO) 7.5-325 MG per tablet TK 1 TABLET PO EVERY 6 HOURS AS NEEDED FOR PAIN  0   No current facility-administered medications on file prior to visit.   Allergies  Allergen Reactions  . Propoxyphene Hcl Palpitations   Social History   Social History  . Marital Status: Married    Spouse Name: N/A  . Number of Children: N/A  . Years of Education:  N/A   Occupational History  . Not on file.   Social History Main Topics  . Smoking status: Former Smoker    Types: Cigarettes  . Smokeless tobacco: Former Systems developer    Quit date: 01/22/1979  . Alcohol Use: No  . Drug Use: No  . Sexual Activity: Not on file   Other Topics Concern  . Not on file   Social History Narrative     Review of Systems  All other systems reviewed and are negative.      Objective:   Physical Exam  Cardiovascular: Normal rate and regular rhythm.   Pulmonary/Chest: Effort normal and breath sounds normal.  Musculoskeletal:       Right wrist: She exhibits decreased range of motion, tenderness and bony tenderness.  Vitals reviewed.         Assessment & Plan:  Right wrist tendonitis  Using sterile technique, I had the patient maximally abduct her right thumb. This highlighted the the tendon. I then injected a mixture of 1 mL of lidocaine and 1 mL of 40 mg per mL  Kenalog parallel to the body of the tendon. There were no complications. I recommended the patient continue to wear the thumb spica splint for at least one additional week and then gradually worked out of the thumb spica splin. Recheck in one week if no better or sooner if worse

## 2014-11-25 ENCOUNTER — Encounter: Payer: Self-pay | Admitting: Gastroenterology

## 2015-01-11 ENCOUNTER — Other Ambulatory Visit: Payer: Self-pay | Admitting: Family Medicine

## 2015-02-23 DIAGNOSIS — M25559 Pain in unspecified hip: Secondary | ICD-10-CM | POA: Insufficient documentation

## 2015-03-02 ENCOUNTER — Other Ambulatory Visit: Payer: Self-pay | Admitting: Neurosurgery

## 2015-03-02 DIAGNOSIS — M25552 Pain in left hip: Secondary | ICD-10-CM

## 2015-03-10 ENCOUNTER — Ambulatory Visit
Admission: RE | Admit: 2015-03-10 | Discharge: 2015-03-10 | Disposition: A | Discharge status: Home / Self Care | Payer: 59 | Source: Ambulatory Visit | Attending: Neurosurgery | Admitting: Neurosurgery

## 2015-03-10 DIAGNOSIS — M25552 Pain in left hip: Secondary | ICD-10-CM

## 2015-03-10 MED ORDER — GADOBENATE DIMEGLUMINE 529 MG/ML IV SOLN
20.0000 mL | Freq: Once | INTRAVENOUS | Status: AC | PRN
  Administered 2015-03-10: 20 mL via INTRAVENOUS

## 2015-07-07 DIAGNOSIS — M461 Sacroiliitis, not elsewhere classified: Secondary | ICD-10-CM | POA: Insufficient documentation

## 2015-07-20 ENCOUNTER — Other Ambulatory Visit: Payer: Self-pay | Admitting: Family Medicine

## 2015-07-20 NOTE — Telephone Encounter (Signed)
Refill appropriate and filled per protocol. 

## 2015-08-11 ENCOUNTER — Other Ambulatory Visit: Payer: Self-pay | Admitting: Neurosurgery

## 2015-08-11 DIAGNOSIS — M5137 Other intervertebral disc degeneration, lumbosacral region: Secondary | ICD-10-CM

## 2015-08-14 ENCOUNTER — Other Ambulatory Visit: Payer: Self-pay | Admitting: Neurosurgery

## 2015-08-14 ENCOUNTER — Ambulatory Visit
Admission: RE | Admit: 2015-08-14 | Discharge: 2015-08-14 | Disposition: A | Discharge status: Home / Self Care | Payer: 59 | Source: Ambulatory Visit | Attending: Neurosurgery | Admitting: Neurosurgery

## 2015-08-14 DIAGNOSIS — D171 Benign lipomatous neoplasm of skin and subcutaneous tissue of trunk: Secondary | ICD-10-CM

## 2015-08-14 DIAGNOSIS — M5137 Other intervertebral disc degeneration, lumbosacral region: Secondary | ICD-10-CM

## 2015-10-30 ENCOUNTER — Other Ambulatory Visit: Payer: Self-pay | Admitting: Family Medicine

## 2016-01-14 HISTORY — PX: OTHER SURGICAL HISTORY: SHX169

## 2016-01-25 ENCOUNTER — Other Ambulatory Visit: Payer: Self-pay | Admitting: Orthopedic Surgery

## 2016-01-25 DIAGNOSIS — S52121A Displaced fracture of head of right radius, initial encounter for closed fracture: Secondary | ICD-10-CM | POA: Insufficient documentation

## 2016-01-25 DIAGNOSIS — S53114A Anterior dislocation of right ulnohumeral joint, initial encounter: Secondary | ICD-10-CM | POA: Insufficient documentation

## 2016-01-26 ENCOUNTER — Ambulatory Visit (HOSPITAL_BASED_OUTPATIENT_CLINIC_OR_DEPARTMENT_OTHER): Payer: 59 | Admitting: Anesthesiology

## 2016-01-26 ENCOUNTER — Ambulatory Visit (HOSPITAL_BASED_OUTPATIENT_CLINIC_OR_DEPARTMENT_OTHER)
Admission: RE | Admit: 2016-01-26 | Discharge: 2016-01-26 | Disposition: A | Discharge status: Home / Self Care | Payer: 59 | Source: Ambulatory Visit | Attending: Orthopedic Surgery | Admitting: Orthopedic Surgery

## 2016-01-26 ENCOUNTER — Encounter (HOSPITAL_BASED_OUTPATIENT_CLINIC_OR_DEPARTMENT_OTHER)
Admission: RE | Disposition: A | Discharge status: Home / Self Care | Payer: Self-pay | Source: Ambulatory Visit | Attending: Orthopedic Surgery

## 2016-01-26 ENCOUNTER — Encounter (HOSPITAL_BASED_OUTPATIENT_CLINIC_OR_DEPARTMENT_OTHER): Payer: Self-pay | Admitting: *Deleted

## 2016-01-26 DIAGNOSIS — Z87891 Personal history of nicotine dependence: Secondary | ICD-10-CM | POA: Diagnosis not present

## 2016-01-26 DIAGNOSIS — X58XXXD Exposure to other specified factors, subsequent encounter: Secondary | ICD-10-CM | POA: Diagnosis not present

## 2016-01-26 DIAGNOSIS — Z79890 Hormone replacement therapy (postmenopausal): Secondary | ICD-10-CM | POA: Insufficient documentation

## 2016-01-26 DIAGNOSIS — Z79899 Other long term (current) drug therapy: Secondary | ICD-10-CM | POA: Insufficient documentation

## 2016-01-26 DIAGNOSIS — S52121G Displaced fracture of head of right radius, subsequent encounter for closed fracture with delayed healing: Secondary | ICD-10-CM | POA: Insufficient documentation

## 2016-01-26 HISTORY — PX: RADIAL HEAD ARTHROPLASTY: SHX6044

## 2016-01-26 SURGERY — ARTHROPLASTY, RADIUS, HEAD
Anesthesia: General | Site: Elbow | Laterality: Right

## 2016-01-26 MED ORDER — MIDAZOLAM HCL 2 MG/2ML IJ SOLN
INTRAMUSCULAR | Status: AC
  Filled 2016-01-26: qty 2

## 2016-01-26 MED ORDER — ONDANSETRON HCL 4 MG/2ML IJ SOLN
INTRAMUSCULAR | Status: AC
  Filled 2016-01-26: qty 2

## 2016-01-26 MED ORDER — 0.9 % SODIUM CHLORIDE (POUR BTL) OPTIME
TOPICAL | Status: DC | PRN
  Administered 2016-01-26: 240 mL

## 2016-01-26 MED ORDER — FENTANYL CITRATE (PF) 100 MCG/2ML IJ SOLN
50.0000 ug | INTRAMUSCULAR | Status: AC | PRN
  Administered 2016-01-26 (×2): 25 ug via INTRAVENOUS
  Administered 2016-01-26: 100 ug via INTRAVENOUS
  Administered 2016-01-26: 50 ug via INTRAVENOUS

## 2016-01-26 MED ORDER — CHLORHEXIDINE GLUCONATE 4 % EX LIQD: 60.0000 mL | Freq: Once | CUTANEOUS | Status: DC

## 2016-01-26 MED ORDER — MIDAZOLAM HCL 2 MG/2ML IJ SOLN
1.0000 mg | INTRAMUSCULAR | Status: DC | PRN
  Administered 2016-01-26: 2 mg via INTRAVENOUS

## 2016-01-26 MED ORDER — DEXAMETHASONE SODIUM PHOSPHATE 10 MG/ML IJ SOLN
INTRAMUSCULAR | Status: DC | PRN
  Administered 2016-01-26: 10 mg via INTRAVENOUS

## 2016-01-26 MED ORDER — CEFAZOLIN SODIUM-DEXTROSE 2-4 GM/100ML-% IV SOLN
2.0000 g | INTRAVENOUS | Status: AC
  Administered 2016-01-26: 2 g via INTRAVENOUS

## 2016-01-26 MED ORDER — OXYCODONE-ACETAMINOPHEN 5-325 MG PO TABS: 1.0000 | ORAL_TABLET | ORAL | 0 refills | Status: DC | PRN

## 2016-01-26 MED ORDER — DEXAMETHASONE SODIUM PHOSPHATE 10 MG/ML IJ SOLN
INTRAMUSCULAR | Status: AC
  Filled 2016-01-26: qty 1

## 2016-01-26 MED ORDER — PROPOFOL 10 MG/ML IV BOLUS
INTRAVENOUS | Status: AC
  Filled 2016-01-26: qty 20

## 2016-01-26 MED ORDER — LIDOCAINE 2% (20 MG/ML) 5 ML SYRINGE
INTRAMUSCULAR | Status: DC | PRN
  Administered 2016-01-26: 60 mg via INTRAVENOUS

## 2016-01-26 MED ORDER — LACTATED RINGERS IV SOLN
INTRAVENOUS | Status: DC
  Administered 2016-01-26 (×3): via INTRAVENOUS

## 2016-01-26 MED ORDER — FENTANYL CITRATE (PF) 100 MCG/2ML IJ SOLN
INTRAMUSCULAR | Status: AC
  Filled 2016-01-26: qty 2

## 2016-01-26 MED ORDER — PROPOFOL 10 MG/ML IV BOLUS
INTRAVENOUS | Status: DC | PRN
  Administered 2016-01-26: 200 mg via INTRAVENOUS
  Administered 2016-01-26: 30 mg via INTRAVENOUS
  Administered 2016-01-26: 20 mg via INTRAVENOUS
  Administered 2016-01-26: 30 mg via INTRAVENOUS

## 2016-01-26 MED ORDER — CEFAZOLIN SODIUM-DEXTROSE 2-4 GM/100ML-% IV SOLN
INTRAVENOUS | Status: AC
  Filled 2016-01-26: qty 100

## 2016-01-26 MED ORDER — LIDOCAINE 2% (20 MG/ML) 5 ML SYRINGE
INTRAMUSCULAR | Status: AC
  Filled 2016-01-26: qty 5

## 2016-01-26 MED ORDER — HYDROMORPHONE HCL 1 MG/ML IJ SOLN: 0.2500 mg | INTRAMUSCULAR | Status: DC | PRN

## 2016-01-26 MED ORDER — BUPIVACAINE-EPINEPHRINE (PF) 0.5% -1:200000 IJ SOLN
INTRAMUSCULAR | Status: DC | PRN
  Administered 2016-01-26: 30 mL via PERINEURAL

## 2016-01-26 SURGICAL SUPPLY — 80 items
ALIGN RADIAL STEM 7 MM ×2 IMPLANT
APL SKNCLS STERI-STRIP NONHPOA (GAUZE/BANDAGES/DRESSINGS) ×1
BAG DECANTER FOR FLEXI CONT (MISCELLANEOUS) IMPLANT
BANDAGE ACE 3X5.8 VEL STRL LF (GAUZE/BANDAGES/DRESSINGS) ×2 IMPLANT
BANDAGE ACE 4X5 VEL STRL LF (GAUZE/BANDAGES/DRESSINGS) ×2 IMPLANT
BENZOIN TINCTURE PRP APPL 2/3 (GAUZE/BANDAGES/DRESSINGS) ×2 IMPLANT
BLADE AVERAGE 25X9 (BLADE) ×2 IMPLANT
BLADE MINI RND TIP GREEN BEAV (BLADE) IMPLANT
BLADE SURG 15 STRL LF DISP TIS (BLADE) ×2 IMPLANT
BLADE SURG 15 STRL SS (BLADE) ×4
BNDG CMPR 9X4 STRL LF SNTH (GAUZE/BANDAGES/DRESSINGS) ×1
BNDG ESMARK 4X9 LF (GAUZE/BANDAGES/DRESSINGS) ×2 IMPLANT
BNDG GAUZE ELAST 4 BULKY (GAUZE/BANDAGES/DRESSINGS) ×2 IMPLANT
CANISTER SUCT 1200ML W/VALVE (MISCELLANEOUS) ×2 IMPLANT
CORDS BIPOLAR (ELECTRODE) ×2 IMPLANT
COVER BACK TABLE 60X90IN (DRAPES) ×2 IMPLANT
CUFF TOURNIQUET SINGLE 18IN (TOURNIQUET CUFF) IMPLANT
CUFF TOURNIQUET SINGLE 24IN (TOURNIQUET CUFF) ×2 IMPLANT
DECANTER SPIKE VIAL GLASS SM (MISCELLANEOUS) IMPLANT
DRAPE EXTREMITY T 121X128X90 (DRAPE) ×2 IMPLANT
DRAPE OEC MINIVIEW 54X84 (DRAPES) ×2 IMPLANT
DRAPE SURG 17X23 STRL (DRAPES) ×2 IMPLANT
DRSG KUZMA FLUFF (GAUZE/BANDAGES/DRESSINGS) IMPLANT
DURAPREP 26ML APPLICATOR (WOUND CARE) ×2 IMPLANT
ELECT REM PT RETURN 9FT ADLT (ELECTROSURGICAL)
ELECTRODE REM PT RTRN 9FT ADLT (ELECTROSURGICAL) IMPLANT
GAUZE SPONGE 4X4 12PLY STRL (GAUZE/BANDAGES/DRESSINGS) ×2 IMPLANT
GAUZE SPONGE 4X4 16PLY XRAY LF (GAUZE/BANDAGES/DRESSINGS) IMPLANT
GAUZE XEROFORM 1X8 LF (GAUZE/BANDAGES/DRESSINGS) IMPLANT
GLOVE BIOGEL M STRL SZ7.5 (GLOVE) ×4 IMPLANT
GLOVE BIOGEL PI IND STRL 8 (GLOVE) ×1 IMPLANT
GLOVE BIOGEL PI IND STRL 8.5 (GLOVE) IMPLANT
GLOVE BIOGEL PI INDICATOR 8 (GLOVE) ×1
GLOVE BIOGEL PI INDICATOR 8.5 (GLOVE)
GLOVE SURG ORTHO 8.0 STRL STRW (GLOVE) IMPLANT
GLOVE SURG SYN 8.0 (GLOVE) ×4 IMPLANT
GOWN STRL REUS W/ TWL LRG LVL3 (GOWN DISPOSABLE) ×1 IMPLANT
GOWN STRL REUS W/TWL LRG LVL3 (GOWN DISPOSABLE) ×2
GOWN STRL REUS W/TWL XL LVL3 (GOWN DISPOSABLE) ×6 IMPLANT
HEAD RADIAL 22MM (Head) ×2 IMPLANT
NEEDLE 1/2 CIR CATGUT .05X1.09 (NEEDLE) IMPLANT
NEEDLE HYPO 25X1 1.5 SAFETY (NEEDLE) IMPLANT
NS IRRIG 1000ML POUR BTL (IV SOLUTION) ×2 IMPLANT
PACK BASIN DAY SURGERY FS (CUSTOM PROCEDURE TRAY) ×2 IMPLANT
PAD CAST 3X4 CTTN HI CHSV (CAST SUPPLIES) ×1 IMPLANT
PAD CAST 4YDX4 CTTN HI CHSV (CAST SUPPLIES) ×1 IMPLANT
PADDING CAST ABS 4INX4YD NS (CAST SUPPLIES)
PADDING CAST ABS COTTON 4X4 ST (CAST SUPPLIES) IMPLANT
PADDING CAST COTTON 3X4 STRL (CAST SUPPLIES) ×2
PADDING CAST COTTON 4X4 STRL (CAST SUPPLIES) ×2
PADDING UNDERCAST 2 STRL (CAST SUPPLIES)
PADDING UNDERCAST 2X4 STRL (CAST SUPPLIES) IMPLANT
PENCIL BUTTON HOLSTER BLD 10FT (ELECTRODE) IMPLANT
SHEET MEDIUM DRAPE 40X70 STRL (DRAPES) ×2 IMPLANT
SLING ARM FOAM STRAP LRG (SOFTGOODS) ×2 IMPLANT
SPLINT FAST PLASTER 5X30 (CAST SUPPLIES) ×15
SPLINT PLASTER CAST FAST 5X30 (CAST SUPPLIES) ×15 IMPLANT
SPLINT PLASTER CAST XFAST 4X15 (CAST SUPPLIES) IMPLANT
SPLINT PLASTER XTRA FAST SET 4 (CAST SUPPLIES)
STAPLER VISISTAT (STAPLE) IMPLANT
STOCKINETTE 4X48 STRL (DRAPES) ×2 IMPLANT
STRIP CLOSURE SKIN 1/2X4 (GAUZE/BANDAGES/DRESSINGS) ×2 IMPLANT
SUCTION FRAZIER HANDLE 10FR (MISCELLANEOUS) ×1
SUCTION TUBE FRAZIER 10FR DISP (MISCELLANEOUS) ×1 IMPLANT
SUT ETHIBOND 0 MO6 C/R (SUTURE) ×2 IMPLANT
SUT ETHILON 4 0 PS 2 18 (SUTURE) IMPLANT
SUT MERSILENE 4 0 P 3 (SUTURE) IMPLANT
SUT PROLENE 3 0 PS 2 (SUTURE) ×2 IMPLANT
SUT SILK 2 0 FS (SUTURE) IMPLANT
SUT VIC AB 0 SH 27 (SUTURE) ×2 IMPLANT
SUT VIC AB 2-0 SH 27 (SUTURE) ×2
SUT VIC AB 2-0 SH 27XBRD (SUTURE) ×1 IMPLANT
SUT VIC AB 3-0 FS2 27 (SUTURE) IMPLANT
SUT VICRYL RAPIDE 4-0 (SUTURE) IMPLANT
SUT VICRYL RAPIDE 4/0 PS 2 (SUTURE) IMPLANT
SYR 10ML LL (SYRINGE) IMPLANT
SYR BULB 3OZ (MISCELLANEOUS) ×2 IMPLANT
TOWEL OR 17X24 6PK STRL BLUE (TOWEL DISPOSABLE) ×4 IMPLANT
TUBE CONNECTING 20X1/4 (TUBING) ×2 IMPLANT
UNDERPAD 30X30 (UNDERPADS AND DIAPERS) ×2 IMPLANT

## 2016-01-26 NOTE — Anesthesia Procedure Notes (Signed)
Procedure Name: LMA Insertion Date/Time: 01/26/2016 2:22 PM Performed by: Maryella Shivers Pre-anesthesia Checklist: Patient identified, Emergency Drugs available, Suction available and Patient being monitored Patient Re-evaluated:Patient Re-evaluated prior to inductionOxygen Delivery Method: Circle system utilized Preoxygenation: Pre-oxygenation with 100% oxygen Intubation Type: IV induction Ventilation: Mask ventilation without difficulty LMA: LMA inserted LMA Size: 4.0 Number of attempts: 1 Airway Equipment and Method: Bite block Placement Confirmation: positive ETCO2 Tube secured with: Tape Dental Injury: Teeth and Oropharynx as per pre-operative assessment

## 2016-01-26 NOTE — H&P (Signed)
Lauren Briggs is an 61 y.o. female.   Chief Complaint: Right elbow pain and swelling HPI: Patient is a very pleasant 61 year old female status post a fracture dislocation of the right elbow on 01/13/2016 with partial resection of the radial head and now persistent pain and swelling and x-rays that show a concentric reduction of the joint but greater than 50% loss of the radial head.  Past Medical History:  Diagnosis Date  . Arthritis   . Complication of anesthesia    HX Back CHIPPED TOOTH S/P EXTUBATION   . Hyperlipidemia   . Low back pain radiating to right leg   . Neuromuscular disorder ([place])   . Shortness of breath dyspnea     Past Surgical History:  Procedure Laterality Date  . ABDOMINAL HYSTERECTOMY     PARTIAL  . APPENDECTOMY    . BACK SURGERY     times 4  . CARPAL TUNNEL RELEASE     BIL   . CESAREAN SECTION     PARTIAL HYST  . CLOSED REDUCTION ULNAR SHAFT    . FOOT SURGERY     LT FOOT CYST REMOVED   . NASAL SINUS SURGERY     1994   . radial head Right 01/14/2016   at [facility]    History reviewed. No pertinent family history. Social History:  reports that she has quit smoking. Her smoking use included Cigarettes. She quit smokeless tobacco use about 37 years ago. She reports that she does not drink alcohol or use drugs.  Allergies:  Allergies  Allergen Reactions  . Propoxyphene Hcl Palpitations    Medications Prior to Admission  Medication Sig Dispense Refill  . atorvastatin (LIPITOR) 40 MG tablet TAKE 1 TABLET(40 MG) BY MOUTH DAILY AT 6 PM 90 tablet 0  . Bee Pollen 580 MG CAPS Take 580 mg by mouth 2 (two) times daily.    Marland Kitchen Bioflavonoid Products (ESTER-C) TABS Take 1 tablet by mouth daily.    Marland Kitchen CINNAMON PO Take 1,000 mg by mouth 2 (two) times daily.    . clobetasol cream (TEMOVATE) [id number] % Apply 1 application topically at bedtime. Apply to vaginal area    . Cranberry-Vitamin C-Vitamin E (CRANBERRY PLUS VITAMIN C) 4200-20-3 MG-MG-UNIT CAPS Take 1 capsule by mouth  2 (two) times daily.    . DULoxetine (CYMBALTA) 60 MG capsule TAKE ONE CAPSULE BY MOUTH DAILY AFTER SUPPER  1  . estradiol (ESTRACE) 0.5 MG tablet Take 0.5 mg by mouth daily.    . Flaxseed, Linseed, (FLAX SEED OIL PO) Take 1,200 mg by mouth 2 (two) times daily.    . Multiple Vitamin (MULTIVITAMIN WITH MINERALS) TABS Take 1 tablet by mouth daily after supper. Women's 50 plus    . Omega-3 Fatty Acids (SALMON OIL-1000 PO) Take 1,000 mg by mouth 2 (two) times daily.    Marland Kitchen oxyCODONE-acetaminophen (PERCOCET) 10-325 MG tablet Take 1 tablet by mouth every 4 (four) hours as needed for pain.    Marland Kitchen VAGIFEM 10 MCG TABS vaginal tablet PLACE 1 TABLET VAGINALLY THREE TIMES WEEKLY  1  . HYDROcodone-acetaminophen (NORCO) 7.5-325 MG per tablet TK 1 TABLET PO EVERY 6 HOURS AS NEEDED FOR PAIN  0  . traMADol (ULTRAM) 50 MG tablet Take 50 mg by mouth.    . valACYclovir (VALTREX) 1000 MG tablet Take 1,000 mg by mouth daily after supper.       No results found for this or any previous visit (from the past 48 hour(s)). No results found.  Review  of Systems  All other systems reviewed and are negative.   Blood pressure (!) 163/83, pulse 97, temperature 98.1 F (36.7 C), temperature source Oral, resp. rate 19, height 5\' 4"  (1.626 m), weight 114.8 kg (253 lb), SpO2 93 %. Physical Exam  Constitutional: She is oriented to person, place, and time. She appears well-developed and well-nourished.  HENT:  Head: Normocephalic and atraumatic.  Neck: Normal range of motion.  Cardiovascular: Normal rate.   Respiratory: Effort normal.  Musculoskeletal:       Right elbow: She exhibits swelling and deformity. Tenderness found. Radial head tenderness noted.  Status post right elbow fracture dislocation with pain and decreased range of motion and retained radial head fragments  Neurological: She is alert and oriented to person, place, and time.  Skin: Skin is warm.  Psychiatric: She has a normal mood and affect. Her behavior is  normal. Judgment and thought content normal.     Assessment/Plan As above. Plan on exploration with radial head replacement and ligament repair as necessary.  Schuyler Amor, MD 01/26/2016, 2:09 PM

## 2016-01-26 NOTE — Op Note (Signed)
See note [id number]

## 2016-01-26 NOTE — Transfer of Care (Signed)
Immediate Anesthesia Transfer of Care Note  Patient: Lauren Briggs  Procedure(s) Performed: Procedure(s) with comments: RIGHT RADIAL HEAD REPLACEMENT ARTHROPLASTY WITH LIGAMENT REPAIRS (Right) - Right elbow raial head replacement with possible ligament repairs as needed   Patient Location: PACU  Anesthesia Type:General  Level of Consciousness: awake and sedated  Airway & Oxygen Therapy: Patient Spontanous Breathing and Patient connected to face mask oxygen  Post-op Assessment: Report given to RN  Post vital signs: Reviewed and stable  Last Vitals:  Vitals:   01/26/16 1310 01/26/16 1619  BP:  (!) 157/91  Pulse: 97 (!) 103  Resp: 19 17  Temp:      Last Pain:  Vitals:   01/26/16 1201  TempSrc: Oral  PainSc: 8       Patients Stated Pain Goal: 2 ([id number])  Complications: No apparent anesthesia complications

## 2016-01-26 NOTE — Progress Notes (Signed)
Assisted Dr. Edmond Fitzgerald with right, ultrasound guided, supraclavicular block. Side rails up, monitors on throughout procedure. See vital signs in flow sheet. Tolerated Procedure well. 

## 2016-01-26 NOTE — Anesthesia Procedure Notes (Addendum)
Anesthesia Regional Block:  Supraclavicular block  Pre-Anesthetic Checklist: ,, timeout performed, Correct Patient, Correct Site, Correct Laterality, Correct Procedure, Correct Position, site marked, Risks and benefits discussed, pre-op evaluation,  At surgeon's request and post-op pain management  Laterality: Right  Prep: Maximum Sterile Barrier Precautions used, chloraprep       Needles:  Injection technique: Single-shot  Needle Type: Echogenic Stimulator Needle     Needle Length: 5cm 5 cm Needle Gauge: 22 and 22 G    Additional Needles:  Procedures: ultrasound guided (picture in chart) Supraclavicular block Narrative:  Start time: 01/26/2016 12:25 PM End time: 01/26/2016 12:35 PM Injection made incrementally with aspirations every 5 mL. Anesthesiologist: Roderic Palau  Additional Notes: 2% Lidocaine skin wheel.

## 2016-01-26 NOTE — Anesthesia Postprocedure Evaluation (Signed)
Anesthesia Post Note  Patient: Lauren Briggs  Procedure(s) Performed: Procedure(s) (LRB): RIGHT RADIAL HEAD REPLACEMENT ARTHROPLASTY WITH LIGAMENT REPAIRS (Right)  Patient location during evaluation: PACU Anesthesia Type: General and Regional Level of consciousness: awake and alert Pain management: pain level controlled Vital Signs Assessment: post-procedure vital signs reviewed and stable Respiratory status: spontaneous breathing, nonlabored ventilation and respiratory function stable Cardiovascular status: blood pressure returned to baseline and stable Postop Assessment: no signs of nausea or vomiting Anesthetic complications: no       Last Vitals:  Vitals:   01/26/16 1633 01/26/16 1645  BP: (!) 166/93 (!) 156/82  Pulse: 99 99  Resp: 20 (!) 25  Temp:      Last Pain:  Vitals:   01/26/16 1630  TempSrc:   PainSc: 0-No pain                 Princess Karnes,W. EDMOND

## 2016-01-26 NOTE — Discharge Instructions (Signed)
°  Post Anesthesia Home Care Instructions ° °Activity: °Get plenty of rest for the remainder of the day. A responsible adult should stay with you for 24 hours following the procedure.  °For the next 24 hours, DO NOT: °-Drive a car °-Operate machinery °-Drink alcoholic beverages °-Take any medication unless instructed by your physician °-Make any legal decisions or sign important papers. ° °Meals: °Start with liquid foods such as gelatin or soup. Progress to regular foods as tolerated. Avoid greasy, spicy, heavy foods. If nausea and/or vomiting occur, drink only clear liquids until the nausea and/or vomiting subsides. Call your physician if vomiting continues. ° °Special Instructions/Symptoms: °Your throat may feel dry or sore from the anesthesia or the breathing tube placed in your throat during surgery. If this causes discomfort, gargle with warm salt water. The discomfort should disappear within 24 hours. ° °If you had a scopolamine patch placed behind your ear for the management of post- operative nausea and/or vomiting: ° °1. The medication in the patch is effective for 72 hours, after which it should be removed.  Wrap patch in a tissue and discard in the trash. Wash hands thoroughly with soap and water. °2. You may remove the patch earlier than 72 hours if you experience unpleasant side effects which may include dry mouth, dizziness or visual disturbances. °3. Avoid touching the patch. Wash your hands with soap and water after contact with the patch. °  °Regional Anesthesia Blocks ° °1. Numbness or the inability to move the "blocked" extremity may last from 3-48 hours after placement. The length of time depends on the medication injected and your individual response to the medication. If the numbness is not going away after 48 hours, call your surgeon. ° °2. The extremity that is blocked will need to be protected until the numbness is gone and the  Strength has returned. Because you cannot feel it, you will need  to take extra care to avoid injury. Because it may be weak, you may have difficulty moving it or using it. You may not know what position it is in without looking at it while the block is in effect. ° °3. For blocks in the legs and feet, returning to weight bearing and walking needs to be done carefully. You will need to wait until the numbness is entirely gone and the strength has returned. You should be able to move your leg and foot normally before you try and bear weight or walk. You will need someone to be with you when you first try to ensure you do not fall and possibly risk injury. ° °4. Bruising and tenderness at the needle site are common side effects and will resolve in a few days. ° °5. Persistent numbness or new problems with movement should be communicated to the surgeon or the [facility] ([phone])/ [facility] ([phone]). °

## 2016-01-26 NOTE — Anesthesia Preprocedure Evaluation (Addendum)
Anesthesia Evaluation  Patient identified by MRN, date of birth, ID band Patient awake    Reviewed: Allergy & Precautions, H&P , NPO status , Patient's Chart, lab work & pertinent test results  Airway Mallampati: II  TM Distance: >3 FB Neck ROM: Full    Dental no notable dental hx. (+) Teeth Intact, Dental Advisory Given   Pulmonary neg pulmonary ROS, former smoker,    Pulmonary exam normal breath sounds clear to auscultation       Cardiovascular negative cardio ROS   Rhythm:Regular Rate:Normal     Neuro/Psych negative neurological ROS  negative psych ROS   GI/Hepatic negative GI ROS, Neg liver ROS,   Endo/Other  negative endocrine ROS  Renal/GU negative Renal ROS  negative genitourinary   Musculoskeletal  (+) Arthritis , Osteoarthritis,    Abdominal   Peds  Hematology negative hematology ROS (+)   Anesthesia Other Findings   Reproductive/Obstetrics negative OB ROS                            Anesthesia Physical Anesthesia Plan  ASA: II  Anesthesia Plan: General   Post-op Pain Management: GA combined w/ Regional for post-op pain   Induction: Intravenous  Airway Management Planned: LMA  Additional Equipment:   Intra-op Plan:   Post-operative Plan: Extubation in OR  Informed Consent: I have reviewed the patients History and Physical, chart, labs and discussed the procedure including the risks, benefits and alternatives for the proposed anesthesia with the patient or authorized representative who has indicated his/her understanding and acceptance.   Dental advisory given  Plan Discussed with: CRNA  Anesthesia Plan Comments:        Anesthesia Quick Evaluation

## 2016-01-29 ENCOUNTER — Encounter (HOSPITAL_BASED_OUTPATIENT_CLINIC_OR_DEPARTMENT_OTHER): Payer: Self-pay | Admitting: Orthopedic Surgery

## 2016-01-29 NOTE — Op Note (Signed)
NAMEMarland Kitchen  ALOURA, STEPPER                 ACCOUNT NO.:  [id number]  MEDICAL RECORD NO.:  [id number]  LOCATION:                                FACILITY:  [facility]  PHYSICIAN:  Sheral Apley. Chatham Howington, M.D.DATE OF BIRTH:  February 05, 1955  DATE OF PROCEDURE:  01/26/2016 DATE OF DISCHARGE:  01/26/2016                              OPERATIVE REPORT   __________  PREOPERATIVE DIAGNOSIS:  Fracture dislocation, right elbow with radial head fracture.  POSTOPERATIVE DIAGNOSIS:  Fracture dislocation, right elbow with radial head fracture.  PROCEDURE:  Exploration above with radial head replacement using the [employer] Aligned Radial Head, 22 head, 0 neck, 7 stem.  SURGEON:  Sheral Apley. Burney Gauze, M.D.  ASSISTANT:  Julian Reil, P.A.  ANESTHESIA:  Ax block and general.  COMPLICATION:  None.  DRAINS:  None.  DESCRIPTION OF PROCEDURE:  The patient was taken to the operating suite. After induction of adequate axillary block analgesia and then general laryngeal mask airway anesthetic, the right upper extremity was prepped and draped in sterile fashion.  Esmarch was used to exsanguinate the limb.  Tourniquet was then inflated to 250 mmHg.  At this point in time, the previous incision from her acute surgery on December 23 was opened after suture removal.  Dissection was carried down to the interval that was established by the prior surgeon between the ECRL and the EDC tendons.  We dissected down to the radial capitellar joint.  We opened up the capsule.  There was a comminuted fracture of the radial head and about 40% of the head had been resected.  There was a large fragment posteriorly intra-articular that we had to remove.  We also irrigated thoroughly.  Once this was done, we used a sagittal saw to transect the remaining radial head at the radial neck.  We carefully identified and retracted the radial nerve.  We then using the [employer] Aligned Radial Head system, broached to a 7 stem.   We trialed a 0 neck and 22 head based on the size of the head fragments that were excised.  This was stable in both flexion, extension, and pronation and supination. The trial was removed.  We thoroughly irrigated.  The implant was placed using appropriate instrumentation and was stable to flexion, extension, pronation, and supination.  We closed the capsule with 0 Ethibond subcutaneously.  The EDC and ECRL interval with 0 Vicryl.  The 2-0 and I Vicryl subcutaneously and 3-0 Prolene subcuticular stitch on the skin.  Steri- Strips, 4x4s fluffs and an elbow splint sugar-tong variety with the forearm in neutral, the wrist in neutral and elbow in 9 degrees of flexion was applied.  The patient tolerated this procedure well and went to recovery in stable fashion.     Sheral Apley Burney Gauze, M.D.     MAW/MEDQ  D:  01/26/2016  T:  01/27/2016  Job:  [id number]

## 2016-04-02 ENCOUNTER — Other Ambulatory Visit: Payer: Self-pay | Admitting: Neurosurgery

## 2016-04-02 DIAGNOSIS — M542 Cervicalgia: Secondary | ICD-10-CM

## 2016-04-05 ENCOUNTER — Ambulatory Visit
Admission: RE | Admit: 2016-04-05 | Discharge: 2016-04-05 | Disposition: A | Discharge status: Home / Self Care | Payer: 59 | Source: Ambulatory Visit | Attending: Neurosurgery | Admitting: Neurosurgery

## 2016-04-05 DIAGNOSIS — M542 Cervicalgia: Secondary | ICD-10-CM

## 2017-01-23 DIAGNOSIS — M5440 Lumbago with sciatica, unspecified side: Secondary | ICD-10-CM | POA: Diagnosis not present

## 2017-02-08 ENCOUNTER — Other Ambulatory Visit: Payer: Self-pay | Admitting: Neurosurgery

## 2017-02-08 DIAGNOSIS — M5440 Lumbago with sciatica, unspecified side: Secondary | ICD-10-CM

## 2017-02-13 ENCOUNTER — Ambulatory Visit
Admission: RE | Admit: 2017-02-13 | Discharge: 2017-02-13 | Disposition: A | Discharge status: Home / Self Care | Payer: BLUE CROSS/BLUE SHIELD | Source: Ambulatory Visit | Attending: Neurosurgery | Admitting: Neurosurgery

## 2017-02-13 DIAGNOSIS — M545 Low back pain: Secondary | ICD-10-CM | POA: Diagnosis not present

## 2017-02-13 DIAGNOSIS — M5440 Lumbago with sciatica, unspecified side: Secondary | ICD-10-CM

## 2017-02-18 DIAGNOSIS — R030 Elevated blood-pressure reading, without diagnosis of hypertension: Secondary | ICD-10-CM | POA: Diagnosis not present

## 2017-02-18 DIAGNOSIS — M5137 Other intervertebral disc degeneration, lumbosacral region: Secondary | ICD-10-CM | POA: Diagnosis not present

## 2017-02-18 DIAGNOSIS — Z6841 Body Mass Index (BMI) 40.0 and over, adult: Secondary | ICD-10-CM | POA: Diagnosis not present

## 2017-03-20 DIAGNOSIS — N6489 Other specified disorders of breast: Secondary | ICD-10-CM | POA: Diagnosis not present

## 2017-03-20 DIAGNOSIS — N6314 Unspecified lump in the right breast, lower inner quadrant: Secondary | ICD-10-CM | POA: Diagnosis not present

## 2017-03-20 DIAGNOSIS — N6312 Unspecified lump in the right breast, upper inner quadrant: Secondary | ICD-10-CM | POA: Diagnosis not present

## 2017-03-26 DIAGNOSIS — L720 Epidermal cyst: Secondary | ICD-10-CM | POA: Diagnosis not present

## 2017-03-26 DIAGNOSIS — L723 Sebaceous cyst: Secondary | ICD-10-CM | POA: Diagnosis not present

## 2017-03-26 DIAGNOSIS — L918 Other hypertrophic disorders of the skin: Secondary | ICD-10-CM | POA: Diagnosis not present

## 2017-03-28 DIAGNOSIS — H52203 Unspecified astigmatism, bilateral: Secondary | ICD-10-CM | POA: Diagnosis not present

## 2017-08-21 DIAGNOSIS — S52121A Displaced fracture of head of right radius, initial encounter for closed fracture: Secondary | ICD-10-CM | POA: Diagnosis not present

## 2017-08-21 DIAGNOSIS — S53114A Anterior dislocation of right ulnohumeral joint, initial encounter: Secondary | ICD-10-CM | POA: Diagnosis not present

## 2017-08-28 ENCOUNTER — Encounter: Payer: Self-pay | Admitting: Gastroenterology

## 2017-09-18 DIAGNOSIS — S53114A Anterior dislocation of right ulnohumeral joint, initial encounter: Secondary | ICD-10-CM | POA: Diagnosis not present

## 2017-09-18 DIAGNOSIS — S52121A Displaced fracture of head of right radius, initial encounter for closed fracture: Secondary | ICD-10-CM | POA: Diagnosis not present

## 2017-10-01 DIAGNOSIS — M5412 Radiculopathy, cervical region: Secondary | ICD-10-CM | POA: Diagnosis not present

## 2017-10-13 DIAGNOSIS — S53114A Anterior dislocation of right ulnohumeral joint, initial encounter: Secondary | ICD-10-CM | POA: Diagnosis not present

## 2017-10-13 DIAGNOSIS — M5412 Radiculopathy, cervical region: Secondary | ICD-10-CM | POA: Diagnosis not present

## 2017-10-13 DIAGNOSIS — S52121A Displaced fracture of head of right radius, initial encounter for closed fracture: Secondary | ICD-10-CM | POA: Diagnosis not present

## 2017-10-20 DIAGNOSIS — N6312 Unspecified lump in the right breast, upper inner quadrant: Secondary | ICD-10-CM | POA: Diagnosis not present

## 2017-10-20 DIAGNOSIS — N6314 Unspecified lump in the right breast, lower inner quadrant: Secondary | ICD-10-CM | POA: Diagnosis not present

## 2017-10-31 DIAGNOSIS — Z01419 Encounter for gynecological examination (general) (routine) without abnormal findings: Secondary | ICD-10-CM | POA: Diagnosis not present

## 2017-10-31 DIAGNOSIS — N763 Subacute and chronic vulvitis: Secondary | ICD-10-CM | POA: Diagnosis not present

## 2017-10-31 DIAGNOSIS — Z6841 Body Mass Index (BMI) 40.0 and over, adult: Secondary | ICD-10-CM | POA: Diagnosis not present

## 2017-10-31 LAB — HM PAP SMEAR: HM Pap smear: NORMAL

## 2018-01-06 ENCOUNTER — Encounter: Payer: Self-pay | Admitting: Gastroenterology

## 2018-01-23 ENCOUNTER — Encounter: Payer: Self-pay | Admitting: Gastroenterology

## 2018-01-29 ENCOUNTER — Encounter: Payer: Self-pay | Admitting: Gastroenterology

## 2018-01-29 ENCOUNTER — Ambulatory Visit (AMBULATORY_SURGERY_CENTER): Payer: BLUE CROSS/BLUE SHIELD

## 2018-01-29 VITALS — Ht 63.0 in | Wt 246.3 lb

## 2018-01-29 DIAGNOSIS — Z1211 Encounter for screening for malignant neoplasm of colon: Secondary | ICD-10-CM

## 2018-01-29 MED ORDER — NA SULFATE-K SULFATE-MG SULF 17.5-3.13-1.6 GM/177ML PO SOLN: 1.0000 | Freq: Once | ORAL | 0 refills | Status: AC

## 2018-01-29 NOTE — Progress Notes (Signed)
No egg or soy allergy known to patient  No issues with past sedation with any surgeries  or procedures, no intubation problems  No diet pills per patient No home 02 use per patient  No blood thinners per patient  Pt denies issues with constipation  No A fib or A flutter  EMMI video sent to pt's e mail - declined   

## 2018-02-09 ENCOUNTER — Ambulatory Visit (AMBULATORY_SURGERY_CENTER): Payer: BLUE CROSS/BLUE SHIELD | Admitting: Gastroenterology

## 2018-02-09 ENCOUNTER — Encounter: Payer: Self-pay | Admitting: Gastroenterology

## 2018-02-09 VITALS — BP 164/86 | HR 75 | Temp 99.1°F | Resp 17 | Ht 63.0 in | Wt 246.0 lb

## 2018-02-09 DIAGNOSIS — K635 Polyp of colon: Secondary | ICD-10-CM

## 2018-02-09 DIAGNOSIS — D125 Benign neoplasm of sigmoid colon: Secondary | ICD-10-CM

## 2018-02-09 DIAGNOSIS — Z1211 Encounter for screening for malignant neoplasm of colon: Secondary | ICD-10-CM

## 2018-02-09 MED ORDER — SODIUM CHLORIDE 0.9 % IV SOLN
500.0000 mL | Freq: Once | INTRAVENOUS | Status: DC
Start: 2018-02-09 — End: 2018-08-15

## 2018-02-09 NOTE — Op Note (Signed)
[facility] Patient Name: Lauren Briggs Procedure Date: 02/09/2018 2:32 PM MRN: [id number] Endoscopist: Ladene Artist , MD Age: 63 Referring MD:  Date of Birth: 1955-10-30 Gender: Female Account #: [id number] Procedure:                Colonoscopy Indications:              Screening for colorectal malignant neoplasm Medicines:                Monitored Anesthesia Care Procedure:                Pre-Anesthesia Assessment:                           - Prior to the procedure, a History and Physical                            was performed, and patient medications and                            allergies were reviewed. The patient's tolerance of                            previous anesthesia was also reviewed. The risks                            and benefits of the procedure and the sedation                            options and risks were discussed with the patient.                            All questions were answered, and informed consent                            was obtained. Prior Anticoagulants: The patient has                            taken no previous anticoagulant or antiplatelet                            agents. ASA Grade Assessment: II - A patient with                            mild systemic disease. After reviewing the risks                            and benefits, the patient was deemed in                            satisfactory condition to undergo the procedure.                           After obtaining informed consent, the colonoscope  was passed under direct vision. Throughout the                            procedure, the patient's blood pressure, pulse, and                            oxygen saturations were monitored continuously. The                            Colonoscope was introduced through the anus and                            advanced to the the cecum, identified by                            appendiceal orifice and  ileocecal valve. The                            ileocecal valve, appendiceal orifice, and rectum                            were photographed. The quality of the bowel                            preparation was good. The colonoscopy was performed                            without difficulty. The patient tolerated the                            procedure well. Scope In: 2:45:01 PM Scope Out: 3:00:12 PM Scope Withdrawal Time: 0 hours 12 minutes 10 seconds  Total Procedure Duration: 0 hours 15 minutes 11 seconds  Findings:                 The perianal and digital rectal examinations were                            normal.                           A 6 mm polyp was found in the sigmoid colon. The                            polyp was sessile. The polyp was removed with a                            cold snare. Resection and retrieval were complete.                           A few small-mouthed diverticula were found in the                            left colon.  Internal hemorrhoids were found during                            retroflexion. The hemorrhoids were small and Grade                            I (internal hemorrhoids that do not prolapse).                           The exam was otherwise without abnormality on                            direct and retroflexion views. Complications:            No immediate complications. Estimated blood loss:                            None. Estimated Blood Loss:     Estimated blood loss: none. Impression:               - One 6 mm polyp in the sigmoid colon, removed with                            a cold snare. Resected and retrieved.                           - Mild diverticulosis in the left colon.                           - Internal hemorrhoids.                           - The examination was otherwise normal on direct                            and retroflexion views. Recommendation:           - Repeat colonoscopy in 5  years for surveillance if                            polyp is precancerous, otherwise 10 years.                           - Patient has a contact number available for                            emergencies. The signs and symptoms of potential                            delayed complications were discussed with the                            patient. Return to normal activities tomorrow.                            Written discharge instructions were provided to  the                            patient.                           - High fiber diet.                           - Continue present medications.                           - Await pathology results. Ladene Artist, MD 02/09/2018 3:03:29 PM This report has been signed electronically.

## 2018-02-09 NOTE — Progress Notes (Signed)
Called to room to assist during endoscopic procedure.  Patient ID and intended procedure confirmed with present staff. Received instructions for my participation in the procedure from the performing physician.  

## 2018-02-09 NOTE — Patient Instructions (Signed)
YOU HAD AN ENDOSCOPIC PROCEDURE TODAY AT [facility]:   Refer to the procedure report that was given to you for any specific questions about what was found during the examination.  If the procedure report does not answer your questions, please call your gastroenterologist to clarify.  If you requested that your care partner not be given the details of your procedure findings, then the procedure report has been included in a sealed envelope for you to review at your convenience later.  YOU SHOULD EXPECT: Some feelings of bloating in the abdomen. Passage of more gas than usual.  Walking can help get rid of the air that was put into your GI tract during the procedure and reduce the bloating. If you had a lower endoscopy (such as a colonoscopy or flexible sigmoidoscopy) you may notice spotting of blood in your stool or on the toilet paper. If you underwent a bowel prep for your procedure, you may not have a normal bowel movement for a few days.  Please Note:  You might notice some irritation and congestion in your nose or some drainage.  This is from the oxygen used during your procedure.  There is no need for concern and it should clear up in a day or so.  SYMPTOMS TO REPORT IMMEDIATELY:   Following lower endoscopy (colonoscopy or flexible sigmoidoscopy):  Excessive amounts of blood in the stool  Significant tenderness or worsening of abdominal pains  Swelling of the abdomen that is new, acute  Fever of 100F or higher  For urgent or emergent issues, a gastroenterologist can be reached at any hour by calling [phone].   DIET:  We do recommend a small meal at first, but then you may proceed to your regular diet.  Drink plenty of fluids but you should avoid alcoholic beverages for 24 hours.  ACTIVITY:  You should plan to take it easy for the rest of today and you should NOT DRIVE or use heavy machinery until tomorrow (because of the sedation medicines used during the test).     FOLLOW UP: Our staff will call the number listed on your records the next business day following your procedure to check on you and address any questions or concerns that you may have regarding the information given to you following your procedure. If we do not reach you, we will leave a message.  However, if you are feeling well and you are not experiencing any problems, there is no need to return our call.  We will assume that you have returned to your regular daily activities without incident.  If any biopsies were taken you will be contacted by phone or by letter within the next 1-3 weeks.  Please call us at [phone] if you have not heard about the biopsies in 3 weeks.   Await for biopsy results Polyps (handout given) Hemorrhoids (handout given) Diverticulosis (handout given) High Fiber Diet (handout given)   SIGNATURES/CONFIDENTIALITY: You and/or your care partner have signed paperwork which will be entered into your electronic medical record.  These signatures attest to the fact that that the information above on your After Visit Summary has been reviewed and is understood.  Full responsibility of the confidentiality of this discharge information lies with you and/or your care-partner.

## 2018-02-09 NOTE — Progress Notes (Signed)
PT taken to PACU. Monitors in place. VSS. Report given to RN. 

## 2018-02-10 ENCOUNTER — Telehealth: Payer: Self-pay

## 2018-02-10 ENCOUNTER — Telehealth: Payer: Self-pay | Admitting: *Deleted

## 2018-02-10 NOTE — Telephone Encounter (Signed)
   First follow up call attempt. Reached voicemail with name identified.  Message left to call if any questions or concerns. 

## 2018-02-10 NOTE — Telephone Encounter (Signed)
  Follow up Call-  Call back number 02/09/2018  Post procedure Call Back phone  # [phone]  Permission to leave phone message Yes  Some recent data might be hidden     Patient questions:  Do you have a fever, pain , or abdominal swelling? No. Pain Score  0 *  Have you tolerated food without any problems? Yes.    Have you been able to return to your normal activities? Yes.    Do you have any questions about your discharge instructions: Diet   No. Medications  No. Follow up visit  No.  Do you have questions or concerns about your Care? No.  Actions: * If pain score is 4 or above: No action needed, pain <4.

## 2018-02-12 ENCOUNTER — Encounter: Payer: Self-pay | Admitting: Gastroenterology

## 2018-02-16 ENCOUNTER — Encounter: Payer: Self-pay | Admitting: Gastroenterology

## 2018-05-07 DIAGNOSIS — M5440 Lumbago with sciatica, unspecified side: Secondary | ICD-10-CM | POA: Diagnosis not present

## 2018-05-11 ENCOUNTER — Other Ambulatory Visit: Payer: Self-pay | Admitting: Neurosurgery

## 2018-05-11 DIAGNOSIS — M5440 Lumbago with sciatica, unspecified side: Secondary | ICD-10-CM

## 2018-05-14 ENCOUNTER — Other Ambulatory Visit: Payer: Self-pay

## 2018-05-14 ENCOUNTER — Ambulatory Visit
Admission: RE | Admit: 2018-05-14 | Discharge: 2018-05-14 | Disposition: A | Discharge status: Home / Self Care | Payer: BLUE CROSS/BLUE SHIELD | Source: Ambulatory Visit | Attending: Neurosurgery | Admitting: Neurosurgery

## 2018-05-14 DIAGNOSIS — M545 Low back pain: Secondary | ICD-10-CM | POA: Diagnosis not present

## 2018-05-14 DIAGNOSIS — M5440 Lumbago with sciatica, unspecified side: Secondary | ICD-10-CM

## 2018-05-19 DIAGNOSIS — M5440 Lumbago with sciatica, unspecified side: Secondary | ICD-10-CM | POA: Diagnosis not present

## 2018-05-19 DIAGNOSIS — M5126 Other intervertebral disc displacement, lumbar region: Secondary | ICD-10-CM | POA: Diagnosis not present

## 2018-05-27 DIAGNOSIS — M47816 Spondylosis without myelopathy or radiculopathy, lumbar region: Secondary | ICD-10-CM | POA: Diagnosis not present

## 2018-05-27 DIAGNOSIS — Z6841 Body Mass Index (BMI) 40.0 and over, adult: Secondary | ICD-10-CM | POA: Diagnosis not present

## 2018-05-27 DIAGNOSIS — R030 Elevated blood-pressure reading, without diagnosis of hypertension: Secondary | ICD-10-CM | POA: Diagnosis not present

## 2018-06-01 DIAGNOSIS — Z03818 Encounter for observation for suspected exposure to other biological agents ruled out: Secondary | ICD-10-CM | POA: Diagnosis not present

## 2018-06-09 DIAGNOSIS — M461 Sacroiliitis, not elsewhere classified: Secondary | ICD-10-CM | POA: Diagnosis not present

## 2018-06-09 DIAGNOSIS — M791 Myalgia, unspecified site: Secondary | ICD-10-CM | POA: Diagnosis not present

## 2018-06-09 DIAGNOSIS — R030 Elevated blood-pressure reading, without diagnosis of hypertension: Secondary | ICD-10-CM | POA: Diagnosis not present

## 2018-06-09 DIAGNOSIS — Z6841 Body Mass Index (BMI) 40.0 and over, adult: Secondary | ICD-10-CM | POA: Diagnosis not present

## 2018-06-12 DIAGNOSIS — R030 Elevated blood-pressure reading, without diagnosis of hypertension: Secondary | ICD-10-CM | POA: Diagnosis not present

## 2018-06-12 DIAGNOSIS — Z6841 Body Mass Index (BMI) 40.0 and over, adult: Secondary | ICD-10-CM | POA: Diagnosis not present

## 2018-06-12 DIAGNOSIS — M461 Sacroiliitis, not elsewhere classified: Secondary | ICD-10-CM | POA: Diagnosis not present

## 2018-06-25 DIAGNOSIS — M461 Sacroiliitis, not elsewhere classified: Secondary | ICD-10-CM | POA: Diagnosis not present

## 2018-06-25 DIAGNOSIS — M5440 Lumbago with sciatica, unspecified side: Secondary | ICD-10-CM | POA: Diagnosis not present

## 2018-07-02 ENCOUNTER — Other Ambulatory Visit: Payer: Self-pay | Admitting: Neurosurgery

## 2018-07-02 ENCOUNTER — Other Ambulatory Visit (HOSPITAL_COMMUNITY): Payer: Self-pay | Admitting: Neurosurgery

## 2018-07-03 ENCOUNTER — Other Ambulatory Visit (HOSPITAL_COMMUNITY): Payer: Self-pay | Admitting: Neurosurgery

## 2018-07-03 ENCOUNTER — Other Ambulatory Visit: Payer: Self-pay | Admitting: Neurosurgery

## 2018-07-03 DIAGNOSIS — M461 Sacroiliitis, not elsewhere classified: Secondary | ICD-10-CM

## 2018-07-13 ENCOUNTER — Other Ambulatory Visit: Payer: Self-pay

## 2018-07-13 ENCOUNTER — Ambulatory Visit (HOSPITAL_COMMUNITY)
Admission: RE | Admit: 2018-07-13 | Discharge: 2018-07-13 | Disposition: A | Discharge status: Home / Self Care | Payer: BC Managed Care – PPO | Source: Ambulatory Visit | Attending: Neurosurgery | Admitting: Neurosurgery

## 2018-07-13 ENCOUNTER — Encounter (HOSPITAL_COMMUNITY)
Admission: RE | Admit: 2018-07-13 | Discharge: 2018-07-13 | Disposition: A | Discharge status: Home / Self Care | Payer: BC Managed Care – PPO | Source: Ambulatory Visit | Attending: Neurosurgery | Admitting: Neurosurgery

## 2018-07-13 DIAGNOSIS — M5126 Other intervertebral disc displacement, lumbar region: Secondary | ICD-10-CM | POA: Diagnosis not present

## 2018-07-13 DIAGNOSIS — M461 Sacroiliitis, not elsewhere classified: Secondary | ICD-10-CM | POA: Diagnosis not present

## 2018-07-13 DIAGNOSIS — M533 Sacrococcygeal disorders, not elsewhere classified: Secondary | ICD-10-CM | POA: Diagnosis not present

## 2018-07-13 MED ORDER — TECHNETIUM TC 99M MEDRONATE IV KIT
20.0000 | PACK | Freq: Once | INTRAVENOUS | Status: AC | PRN
  Administered 2018-07-13: 20 via INTRAVENOUS

## 2018-07-14 ENCOUNTER — Other Ambulatory Visit: Payer: Self-pay | Admitting: Neurosurgery

## 2018-07-14 DIAGNOSIS — M5440 Lumbago with sciatica, unspecified side: Secondary | ICD-10-CM | POA: Diagnosis not present

## 2018-07-14 DIAGNOSIS — M47816 Spondylosis without myelopathy or radiculopathy, lumbar region: Secondary | ICD-10-CM | POA: Diagnosis not present

## 2018-08-07 NOTE — Pre-Procedure Instructions (Signed)
[facility] [address] [place] [address] Phone: [phone] Fax: [phone]      Your procedure is scheduled on Friday July 24th.  Report to [facility] Main Entrance "A" at 5:30 A.M., and check in at the Admitting office.  Call this number if you have problems the morning of surgery:  [phone]  Call [phone] if you have any questions prior to your surgery date Monday-Friday 8am-4pm    Remember:  Do not eat or drink after midnight the night before your surgery      Take these medicines the morning of surgery with A SIP OF WATER  DULoxetine (CYMBALTA)  estradiol (ESTRACE) HYDROcodone-acetaminophen (NORCO/VICODIN) -if needed   As of Today- STOP taking any Aspirin (unless otherwise instructed by your surgeon), Aleve, Naproxen, Ibuprofen, Motrin, Advil, Goody's, BC's, all herbal medications, fish oil, and all vitamins.    The Morning of Surgery  Do not wear jewelry, make-up or nail polish.  Do not wear lotions, powders, or perfumes/colognes, or deodorant  Do not shave 48 hours prior to surgery.  Men may shave face and neck.  Do not bring valuables to the hospital.  [facility] is not responsible for any belongings or valuables.  If you are a smoker, DO NOT Smoke 24 hours prior to surgery IF you wear a CPAP at night please bring your mask, tubing, and machine the morning of surgery   Remember that you must have someone to transport you home after your surgery, and remain with you for 24 hours if you are discharged the same day.   Contacts, glasses, hearing aids, dentures or bridgework may not be worn into surgery.    Leave your suitcase in the car.  After surgery it may be brought to your room.  For patients admitted to the hospital, discharge time will be determined by your treatment team.  Patients discharged the day of surgery will not be allowed to drive  home.    Special instructions:   [facility]- Preparing For Surgery  Before surgery, you can play an important role. Because skin is not sterile, your skin needs to be as free of germs as possible. You can reduce the number of germs on your skin by washing with CHG (chlorahexidine gluconate) Soap before surgery.  CHG is an antiseptic cleaner which kills germs and bonds with the skin to continue killing germs even after washing.    Oral Hygiene is also important to reduce your risk of infection.  Remember - BRUSH YOUR TEETH THE MORNING OF SURGERY WITH YOUR REGULAR TOOTHPASTE  Please do not use if you have an allergy to CHG or antibacterial soaps. If your skin becomes reddened/irritated stop using the CHG.  Do not shave (including legs and underarms) for at least 48 hours prior to first CHG shower. It is OK to shave your face.  Please follow these instructions carefully.   1. Shower the NIGHT BEFORE SURGERY and the MORNING OF SURGERY with CHG Soap.   2. If you chose to wash your hair, wash your hair first as usual with your normal shampoo.  3. After you shampoo, rinse your hair and body thoroughly to remove the shampoo.  4. Use CHG as you would any other liquid soap. You can apply CHG directly to the skin and wash gently with a scrungie or a clean washcloth.   5. Apply the CHG Soap to your body  ONLY FROM THE NECK DOWN.  Do not use on open wounds or open sores. Avoid contact with your eyes, ears, mouth and genitals (private parts). Wash Face and genitals (private parts)  with your normal soap.   6. Wash thoroughly, paying special attention to the area where your surgery will be performed.  7. Thoroughly rinse your body with warm water from the neck down.  8. DO NOT shower/wash with your normal soap after using and rinsing off the CHG Soap.  9. Pat yourself dry with a CLEAN TOWEL.  10. Wear CLEAN PAJAMAS to bed the night before surgery, wear comfortable clothes the morning of  surgery  11. Place CLEAN SHEETS on your bed the night of your first shower and DO NOT SLEEP WITH PETS.    Day of Surgery:  Do not apply any deodorants/lotions. Please shower the morning of surgery with the CHG soap  Please wear clean clothes to the hospital/surgery center.   Remember to brush your teeth WITH YOUR REGULAR TOOTHPASTE.   Please read over the following fact sheets that you were given.

## 2018-08-10 ENCOUNTER — Encounter (HOSPITAL_COMMUNITY)
Admission: RE | Admit: 2018-08-10 | Discharge: 2018-08-10 | Disposition: A | Discharge status: Home / Self Care | Payer: BC Managed Care – PPO | Source: Ambulatory Visit | Attending: Neurosurgery | Admitting: Neurosurgery

## 2018-08-10 ENCOUNTER — Encounter (HOSPITAL_COMMUNITY): Payer: Self-pay

## 2018-08-10 ENCOUNTER — Other Ambulatory Visit: Payer: Self-pay

## 2018-08-10 DIAGNOSIS — Z01812 Encounter for preprocedural laboratory examination: Secondary | ICD-10-CM | POA: Diagnosis not present

## 2018-08-10 LAB — TYPE AND SCREEN
ABO/RH(D): O POS
Antibody Screen: NEGATIVE

## 2018-08-10 LAB — CBC
HCT: 42.1 % (ref 36.0–46.0)
Hemoglobin: 13.3 g/dL (ref 12.0–15.0)
MCH: 30.8 pg (ref 26.0–34.0)
MCHC: 31.6 g/dL (ref 30.0–36.0)
MCV: 97.5 fL (ref 80.0–100.0)
Platelets: 331 10*3/uL (ref 150–400)
RBC: 4.32 MIL/uL (ref 3.87–5.11)
RDW: 13.5 % (ref 11.5–15.5)
WBC: 7.9 10*3/uL (ref 4.0–10.5)
nRBC: 0 % (ref 0.0–0.2)

## 2018-08-10 LAB — SURGICAL PCR SCREEN
MRSA, PCR: NEGATIVE
Staphylococcus aureus: NEGATIVE

## 2018-08-10 NOTE — Progress Notes (Signed)
PCP - Dr. Dennard Schaumann- [facility]  Cardiologist - denies  Chest x-ray - N/A EKG - N/A Stress Test - denies ECHO - denies Cardiac Cath - denies  Sleep Study - denies  Aspirin Instructions: Patient instructed to hold all Aspirin, NSAID's, herbal medications, fish oil and vitamins 7 days prior to surgery.   Anesthesia review:   Patient denies shortness of breath, fever, cough and chest pain at PAT appointment   Patient verbalized understanding of instructions that were given to them at the PAT appointment. Patient was also instructed that they will need to review over the PAT instructions again at home before surgery.

## 2018-08-11 ENCOUNTER — Other Ambulatory Visit (HOSPITAL_COMMUNITY)
Admission: RE | Admit: 2018-08-11 | Discharge: 2018-08-11 | Disposition: A | Discharge status: Home / Self Care | Payer: BC Managed Care – PPO | Source: Ambulatory Visit | Attending: Neurosurgery | Admitting: Neurosurgery

## 2018-08-11 DIAGNOSIS — Z1159 Encounter for screening for other viral diseases: Secondary | ICD-10-CM | POA: Insufficient documentation

## 2018-08-11 LAB — SARS CORONAVIRUS 2 (TAT 6-24 HRS): SARS Coronavirus 2: NEGATIVE

## 2018-08-14 ENCOUNTER — Inpatient Hospital Stay (HOSPITAL_COMMUNITY): Payer: BC Managed Care – PPO | Admitting: Certified Registered"

## 2018-08-14 ENCOUNTER — Encounter (HOSPITAL_COMMUNITY): Payer: Self-pay | Admitting: Registered Nurse

## 2018-08-14 ENCOUNTER — Other Ambulatory Visit: Payer: Self-pay

## 2018-08-14 ENCOUNTER — Inpatient Hospital Stay (HOSPITAL_COMMUNITY)
Admission: RE | Admit: 2018-08-14 | Discharge: 2018-08-15 | DRG: 454 | Disposition: A | Discharge status: Home / Self Care | Payer: BC Managed Care – PPO | Attending: Neurosurgery | Admitting: Neurosurgery

## 2018-08-14 ENCOUNTER — Encounter (HOSPITAL_COMMUNITY)
Admission: RE | Disposition: A | Discharge status: Home / Self Care | Payer: Self-pay | Source: Home / Self Care | Attending: Neurosurgery

## 2018-08-14 ENCOUNTER — Inpatient Hospital Stay (HOSPITAL_COMMUNITY): Payer: BC Managed Care – PPO

## 2018-08-14 DIAGNOSIS — M5136 Other intervertebral disc degeneration, lumbar region: Secondary | ICD-10-CM | POA: Diagnosis present

## 2018-08-14 DIAGNOSIS — M48061 Spinal stenosis, lumbar region without neurogenic claudication: Principal | ICD-10-CM | POA: Diagnosis present

## 2018-08-14 DIAGNOSIS — Z4789 Encounter for other orthopedic aftercare: Secondary | ICD-10-CM | POA: Diagnosis not present

## 2018-08-14 DIAGNOSIS — Z87891 Personal history of nicotine dependence: Secondary | ICD-10-CM | POA: Diagnosis not present

## 2018-08-14 DIAGNOSIS — Z79899 Other long term (current) drug therapy: Secondary | ICD-10-CM | POA: Diagnosis not present

## 2018-08-14 DIAGNOSIS — Z79890 Hormone replacement therapy (postmenopausal): Secondary | ICD-10-CM | POA: Diagnosis not present

## 2018-08-14 DIAGNOSIS — Z1211 Encounter for screening for malignant neoplasm of colon: Secondary | ICD-10-CM

## 2018-08-14 DIAGNOSIS — Y831 Surgical operation with implant of artificial internal device as the cause of abnormal reaction of the patient, or of later complication, without mention of misadventure at the time of the procedure: Secondary | ICD-10-CM | POA: Diagnosis present

## 2018-08-14 DIAGNOSIS — I10 Essential (primary) hypertension: Secondary | ICD-10-CM | POA: Diagnosis present

## 2018-08-14 DIAGNOSIS — M532X6 Spinal instabilities, lumbar region: Secondary | ICD-10-CM | POA: Diagnosis present

## 2018-08-14 DIAGNOSIS — Z419 Encounter for procedure for purposes other than remedying health state, unspecified: Secondary | ICD-10-CM

## 2018-08-14 DIAGNOSIS — M4316 Spondylolisthesis, lumbar region: Secondary | ICD-10-CM | POA: Diagnosis not present

## 2018-08-14 DIAGNOSIS — T8484XA Pain due to internal orthopedic prosthetic devices, implants and grafts, initial encounter: Secondary | ICD-10-CM | POA: Diagnosis not present

## 2018-08-14 DIAGNOSIS — Z981 Arthrodesis status: Secondary | ICD-10-CM

## 2018-08-14 DIAGNOSIS — Z90711 Acquired absence of uterus with remaining cervical stump: Secondary | ICD-10-CM | POA: Diagnosis not present

## 2018-08-14 HISTORY — PX: HARDWARE REMOVAL: SHX979

## 2018-08-14 HISTORY — DX: Essential (primary) hypertension: I10

## 2018-08-14 HISTORY — PX: ANTERIOR LAT LUMBAR FUSION: SHX1168

## 2018-08-14 SURGERY — ANTERIOR LATERAL LUMBAR FUSION 1 LEVEL
Anesthesia: General | Site: Back

## 2018-08-14 MED ORDER — OXYCODONE HCL 5 MG PO TABS
10.0000 mg | ORAL_TABLET | ORAL | Status: DC | PRN
  Administered 2018-08-14 – 2018-08-15 (×3): 10 mg via ORAL
  Filled 2018-08-14 (×3): qty 2

## 2018-08-14 MED ORDER — 0.9 % SODIUM CHLORIDE (POUR BTL) OPTIME
TOPICAL | Status: DC | PRN
  Administered 2018-08-14: 1000 mL

## 2018-08-14 MED ORDER — ROCURONIUM BROMIDE 10 MG/ML (PF) SYRINGE
PREFILLED_SYRINGE | INTRAVENOUS | Status: AC
  Filled 2018-08-14: qty 10

## 2018-08-14 MED ORDER — PROPOFOL 10 MG/ML IV BOLUS
INTRAVENOUS | Status: AC
  Filled 2018-08-14: qty 20

## 2018-08-14 MED ORDER — SODIUM CHLORIDE 0.9% FLUSH
3.0000 mL | Freq: Two times a day (BID) | INTRAVENOUS | Status: DC
  Administered 2018-08-14 – 2018-08-15 (×3): 3 mL via INTRAVENOUS

## 2018-08-14 MED ORDER — FENTANYL CITRATE (PF) 100 MCG/2ML IJ SOLN
25.0000 ug | INTRAMUSCULAR | Status: DC | PRN
  Administered 2018-08-14: 13:00:00 25 ug via INTRAVENOUS
  Administered 2018-08-14: 13:00:00 50 ug via INTRAVENOUS

## 2018-08-14 MED ORDER — MIDAZOLAM HCL 2 MG/2ML IJ SOLN
INTRAMUSCULAR | Status: AC
  Filled 2018-08-14: qty 2

## 2018-08-14 MED ORDER — EPHEDRINE SULFATE-NACL 50-0.9 MG/10ML-% IV SOSY
PREFILLED_SYRINGE | INTRAVENOUS | Status: DC | PRN
  Administered 2018-08-14 (×2): 5 mg via INTRAVENOUS
  Administered 2018-08-14: 10 mg via INTRAVENOUS

## 2018-08-14 MED ORDER — PANTOPRAZOLE SODIUM 40 MG IV SOLR
40.0000 mg | Freq: Every day | INTRAVENOUS | Status: DC
  Filled 2018-08-14: qty 40

## 2018-08-14 MED ORDER — CYCLOBENZAPRINE HCL 10 MG PO TABS
ORAL_TABLET | ORAL | Status: AC
  Administered 2018-08-14: 14:00:00
  Filled 2018-08-14: qty 1

## 2018-08-14 MED ORDER — SODIUM CHLORIDE 0.9 % IV SOLN
INTRAVENOUS | Status: DC | PRN
  Administered 2018-08-14: 08:00:00 25 ug/min via INTRAVENOUS

## 2018-08-14 MED ORDER — LACTATED RINGERS IV SOLN
INTRAVENOUS | Status: DC | PRN
  Administered 2018-08-14 (×3): via INTRAVENOUS

## 2018-08-14 MED ORDER — OXYCODONE HCL 5 MG PO TABS
5.0000 mg | ORAL_TABLET | Freq: Once | ORAL | Status: AC | PRN
  Administered 2018-08-14: 5 mg via ORAL

## 2018-08-14 MED ORDER — CEFAZOLIN SODIUM 1 G IJ SOLR
INTRAMUSCULAR | Status: AC
  Filled 2018-08-14: qty 20

## 2018-08-14 MED ORDER — DULOXETINE HCL 60 MG PO CPEP
60.0000 mg | ORAL_CAPSULE | Freq: Every day | ORAL | Status: DC
  Administered 2018-08-14: 60 mg via ORAL
  Filled 2018-08-14: qty 1

## 2018-08-14 MED ORDER — ACETAMINOPHEN 10 MG/ML IV SOLN
INTRAVENOUS | Status: DC | PRN
  Administered 2018-08-14: 1000 mg via INTRAVENOUS

## 2018-08-14 MED ORDER — LIDOCAINE-EPINEPHRINE 1 %-1:100000 IJ SOLN
INTRAMUSCULAR | Status: DC | PRN
  Administered 2018-08-14 (×2): 10 mL

## 2018-08-14 MED ORDER — FENTANYL CITRATE (PF) 250 MCG/5ML IJ SOLN
INTRAMUSCULAR | Status: DC | PRN
  Administered 2018-08-14 (×5): 50 ug via INTRAVENOUS
  Administered 2018-08-14: 100 ug via INTRAVENOUS
  Administered 2018-08-14 (×2): 50 ug via INTRAVENOUS

## 2018-08-14 MED ORDER — SODIUM CHLORIDE 0.9 % IV SOLN: 250.0000 mL | INTRAVENOUS | Status: DC

## 2018-08-14 MED ORDER — THROMBIN 20000 UNITS EX SOLR
CUTANEOUS | Status: AC
  Filled 2018-08-14: qty 20000

## 2018-08-14 MED ORDER — LIDOCAINE 2% (20 MG/ML) 5 ML SYRINGE
INTRAMUSCULAR | Status: AC
  Filled 2018-08-14: qty 5

## 2018-08-14 MED ORDER — CHLORHEXIDINE GLUCONATE CLOTH 2 % EX PADS: 6.0000 | MEDICATED_PAD | Freq: Once | CUTANEOUS | Status: DC

## 2018-08-14 MED ORDER — ROCURONIUM BROMIDE 10 MG/ML (PF) SYRINGE
PREFILLED_SYRINGE | INTRAVENOUS | Status: DC | PRN
  Administered 2018-08-14 (×3): 10 mg via INTRAVENOUS
  Administered 2018-08-14: 50 mg via INTRAVENOUS
  Administered 2018-08-14: 10 mg via INTRAVENOUS

## 2018-08-14 MED ORDER — ADULT MULTIVITAMIN W/MINERALS CH
1.0000 | ORAL_TABLET | Freq: Every day | ORAL | Status: DC
  Administered 2018-08-14: 22:00:00 1 via ORAL
  Filled 2018-08-14: qty 1

## 2018-08-14 MED ORDER — ARTIFICIAL TEARS OPHTHALMIC OINT
TOPICAL_OINTMENT | OPHTHALMIC | Status: DC | PRN
  Administered 2018-08-14: 1 via OPHTHALMIC

## 2018-08-14 MED ORDER — ALUM & MAG HYDROXIDE-SIMETH 200-200-20 MG/5ML PO SUSP: 30.0000 mL | Freq: Four times a day (QID) | ORAL | Status: DC | PRN

## 2018-08-14 MED ORDER — CEFAZOLIN SODIUM-DEXTROSE 2-4 GM/100ML-% IV SOLN
2.0000 g | INTRAVENOUS | Status: AC
  Administered 2018-08-14 (×2): 2 g via INTRAVENOUS
  Filled 2018-08-14: qty 100

## 2018-08-14 MED ORDER — PHENOL 1.4 % MT LIQD: 1.0000 | OROMUCOSAL | Status: DC | PRN

## 2018-08-14 MED ORDER — OXYCODONE HCL 5 MG/5ML PO SOLN: 5.0000 mg | Freq: Once | ORAL | Status: AC | PRN

## 2018-08-14 MED ORDER — ACETAMINOPHEN 325 MG PO TABS: 650.0000 mg | ORAL_TABLET | ORAL | Status: DC | PRN

## 2018-08-14 MED ORDER — BUPIVACAINE LIPOSOME 1.3 % IJ SUSP
20.0000 mL | Freq: Once | INTRAMUSCULAR | Status: AC
  Filled 2018-08-14: qty 20

## 2018-08-14 MED ORDER — SODIUM CHLORIDE 0.9 % IV SOLN
INTRAVENOUS | Status: DC | PRN
  Administered 2018-08-14: 500 mL

## 2018-08-14 MED ORDER — BUPIVACAINE HCL (PF) 0.25 % IJ SOLN
INTRAMUSCULAR | Status: AC
  Filled 2018-08-14: qty 30

## 2018-08-14 MED ORDER — ESTRADIOL 1 MG PO TABS
1.0000 mg | ORAL_TABLET | Freq: Every day | ORAL | Status: DC
  Administered 2018-08-15: 1 mg via ORAL
  Filled 2018-08-14: qty 1

## 2018-08-14 MED ORDER — MIDAZOLAM HCL 5 MG/5ML IJ SOLN
INTRAMUSCULAR | Status: DC | PRN
  Administered 2018-08-14: 2 mg via INTRAVENOUS

## 2018-08-14 MED ORDER — ARTIFICIAL TEARS OPHTHALMIC OINT
TOPICAL_OINTMENT | OPHTHALMIC | Status: AC
  Filled 2018-08-14: qty 3.5

## 2018-08-14 MED ORDER — ONDANSETRON HCL 4 MG/2ML IJ SOLN
INTRAMUSCULAR | Status: AC
  Filled 2018-08-14: qty 2

## 2018-08-14 MED ORDER — PHENYLEPHRINE 40 MCG/ML (10ML) SYRINGE FOR IV PUSH (FOR BLOOD PRESSURE SUPPORT)
PREFILLED_SYRINGE | INTRAVENOUS | Status: DC | PRN
  Administered 2018-08-14: 80 ug via INTRAVENOUS

## 2018-08-14 MED ORDER — PANTOPRAZOLE SODIUM 40 MG PO TBEC
40.0000 mg | DELAYED_RELEASE_TABLET | Freq: Every day | ORAL | Status: DC
  Administered 2018-08-14: 22:00:00 40 mg via ORAL
  Filled 2018-08-14: qty 1

## 2018-08-14 MED ORDER — SODIUM CHLORIDE 0.9% FLUSH: 3.0000 mL | INTRAVENOUS | Status: DC | PRN

## 2018-08-14 MED ORDER — FENTANYL CITRATE (PF) 250 MCG/5ML IJ SOLN
INTRAMUSCULAR | Status: AC
  Filled 2018-08-14: qty 5

## 2018-08-14 MED ORDER — EPHEDRINE 5 MG/ML INJ
INTRAVENOUS | Status: AC
  Filled 2018-08-14: qty 10

## 2018-08-14 MED ORDER — SUGAMMADEX SODIUM 500 MG/5ML IV SOLN
INTRAVENOUS | Status: AC
  Filled 2018-08-14: qty 5

## 2018-08-14 MED ORDER — DEXAMETHASONE SODIUM PHOSPHATE 10 MG/ML IJ SOLN
10.0000 mg | INTRAMUSCULAR | Status: DC
  Filled 2018-08-14: qty 1

## 2018-08-14 MED ORDER — OXYCODONE HCL 5 MG PO TABS
ORAL_TABLET | ORAL | Status: AC
  Filled 2018-08-14: qty 2

## 2018-08-14 MED ORDER — ALBUMIN HUMAN 5 % IV SOLN
INTRAVENOUS | Status: DC | PRN
  Administered 2018-08-14: 12:00:00 via INTRAVENOUS

## 2018-08-14 MED ORDER — SUCCINYLCHOLINE CHLORIDE 200 MG/10ML IV SOSY
PREFILLED_SYRINGE | INTRAVENOUS | Status: DC | PRN
  Administered 2018-08-14: 140 mg via INTRAVENOUS

## 2018-08-14 MED ORDER — FENTANYL CITRATE (PF) 100 MCG/2ML IJ SOLN
INTRAMUSCULAR | Status: AC
  Filled 2018-08-14: qty 2

## 2018-08-14 MED ORDER — HYDROCODONE-ACETAMINOPHEN 5-325 MG PO TABS
1.0000 | ORAL_TABLET | ORAL | Status: DC | PRN
  Administered 2018-08-14 – 2018-08-15 (×4): 1 via ORAL
  Filled 2018-08-14 (×4): qty 1

## 2018-08-14 MED ORDER — THROMBIN 20000 UNITS EX SOLR
CUTANEOUS | Status: DC | PRN
  Administered 2018-08-14: 07:00:00 20 mL via TOPICAL

## 2018-08-14 MED ORDER — ONDANSETRON HCL 4 MG/2ML IJ SOLN: 4.0000 mg | Freq: Once | INTRAMUSCULAR | Status: DC | PRN

## 2018-08-14 MED ORDER — MENTHOL 3 MG MT LOZG: 1.0000 | LOZENGE | OROMUCOSAL | Status: DC | PRN

## 2018-08-14 MED ORDER — ACETAMINOPHEN 10 MG/ML IV SOLN
INTRAVENOUS | Status: AC
  Filled 2018-08-14: qty 100

## 2018-08-14 MED ORDER — SODIUM CHLORIDE 0.9 % IV SOLN: 500.0000 mL | Freq: Once | INTRAVENOUS | Status: DC

## 2018-08-14 MED ORDER — HYDROMORPHONE HCL 1 MG/ML IJ SOLN: 0.5000 mg | INTRAMUSCULAR | Status: DC | PRN

## 2018-08-14 MED ORDER — ONDANSETRON HCL 4 MG/2ML IJ SOLN: 4.0000 mg | Freq: Four times a day (QID) | INTRAMUSCULAR | Status: DC | PRN

## 2018-08-14 MED ORDER — CYCLOBENZAPRINE HCL 10 MG PO TABS
10.0000 mg | ORAL_TABLET | Freq: Three times a day (TID) | ORAL | Status: DC | PRN
  Administered 2018-08-14 – 2018-08-15 (×3): 10 mg via ORAL
  Filled 2018-08-14 (×3): qty 1

## 2018-08-14 MED ORDER — PROPOFOL 10 MG/ML IV BOLUS
INTRAVENOUS | Status: DC | PRN
  Administered 2018-08-14: 20 mg via INTRAVENOUS
  Administered 2018-08-14: 150 mg via INTRAVENOUS
  Administered 2018-08-14 (×2): 20 mg via INTRAVENOUS

## 2018-08-14 MED ORDER — DEXAMETHASONE SODIUM PHOSPHATE 10 MG/ML IJ SOLN
INTRAMUSCULAR | Status: AC
  Filled 2018-08-14: qty 1

## 2018-08-14 MED ORDER — DEXAMETHASONE SODIUM PHOSPHATE 10 MG/ML IJ SOLN
INTRAMUSCULAR | Status: DC | PRN
  Administered 2018-08-14: 10 mg via INTRAVENOUS

## 2018-08-14 MED ORDER — LIDOCAINE 2% (20 MG/ML) 5 ML SYRINGE
INTRAMUSCULAR | Status: DC | PRN
  Administered 2018-08-14: 50 mg via INTRAVENOUS

## 2018-08-14 MED ORDER — SUGAMMADEX SODIUM 500 MG/5ML IV SOLN
INTRAVENOUS | Status: DC | PRN
  Administered 2018-08-14: 300 mg via INTRAVENOUS

## 2018-08-14 MED ORDER — THROMBIN 5000 UNITS EX SOLR
CUTANEOUS | Status: AC
  Filled 2018-08-14: qty 5000

## 2018-08-14 MED ORDER — ONDANSETRON HCL 4 MG/2ML IJ SOLN
INTRAMUSCULAR | Status: DC | PRN
  Administered 2018-08-14: 4 mg via INTRAVENOUS

## 2018-08-14 MED ORDER — ONDANSETRON HCL 4 MG PO TABS: 4.0000 mg | ORAL_TABLET | Freq: Four times a day (QID) | ORAL | Status: DC | PRN

## 2018-08-14 MED ORDER — PROPOFOL 500 MG/50ML IV EMUL
INTRAVENOUS | Status: DC | PRN
  Administered 2018-08-14: 09:00:00 via INTRAVENOUS
  Administered 2018-08-14: 100 ug/kg/min via INTRAVENOUS

## 2018-08-14 MED ORDER — ACETAMINOPHEN 650 MG RE SUPP: 650.0000 mg | RECTAL | Status: DC | PRN

## 2018-08-14 MED ORDER — PHENYLEPHRINE 40 MCG/ML (10ML) SYRINGE FOR IV PUSH (FOR BLOOD PRESSURE SUPPORT)
PREFILLED_SYRINGE | INTRAVENOUS | Status: AC
  Filled 2018-08-14: qty 10

## 2018-08-14 MED ORDER — SUCCINYLCHOLINE CHLORIDE 200 MG/10ML IV SOSY
PREFILLED_SYRINGE | INTRAVENOUS | Status: AC
  Filled 2018-08-14: qty 10

## 2018-08-14 MED ORDER — OXYCODONE HCL 5 MG PO TABS
ORAL_TABLET | ORAL | Status: AC
  Filled 2018-08-14: qty 1

## 2018-08-14 MED ORDER — LIDOCAINE-EPINEPHRINE 1 %-1:100000 IJ SOLN
INTRAMUSCULAR | Status: AC
  Filled 2018-08-14: qty 1

## 2018-08-14 MED ORDER — BUPIVACAINE LIPOSOME 1.3 % IJ SUSP
INTRAMUSCULAR | Status: DC | PRN
  Administered 2018-08-14: 20 mL

## 2018-08-14 MED ORDER — CEFAZOLIN SODIUM-DEXTROSE 2-4 GM/100ML-% IV SOLN
2.0000 g | Freq: Three times a day (TID) | INTRAVENOUS | Status: AC
  Administered 2018-08-14 – 2018-08-15 (×2): 2 g via INTRAVENOUS
  Filled 2018-08-14 (×2): qty 100

## 2018-08-14 SURGICAL SUPPLY — 90 items
BAG DECANTER FOR FLEXI CONT (MISCELLANEOUS) ×5 IMPLANT
BATTALION LLIF ITRADISCAL SHIM (MISCELLANEOUS) ×3
BENZOIN TINCTURE PRP APPL 2/3 (GAUZE/BANDAGES/DRESSINGS) ×5 IMPLANT
BIT DRILL 5.0/4.0 (BIT) IMPLANT
BLADE CLIPPER SURG (BLADE) ×1 IMPLANT
BONE MATRIX OSTEOCEL PRO MED (Bone Implant) ×1 IMPLANT
BONE VIVIGEN FORMABLE 5.4CC (Bone Implant) ×3 IMPLANT
BUR MATCHSTICK NEURO 3.0 LAGG (BURR) ×3 IMPLANT
BUR PRECISION FLUTE 6.0 (BURR) ×1 IMPLANT
CANISTER SUCT 3000ML PPV (MISCELLANEOUS) IMPLANT
CAP LOCKING THREADED (Cap) ×4 IMPLANT
CARTRIDGE OIL MAESTRO DRILL (MISCELLANEOUS) ×2 IMPLANT
CLIP SPRING STIM LLIF SAFEOP (CLIP) ×1 IMPLANT
COVER WAND RF STERILE (DRAPES) ×6 IMPLANT
DECANTER SPIKE VIAL GLASS SM (MISCELLANEOUS) ×3 IMPLANT
DERMABOND ADVANCED (GAUZE/BANDAGES/DRESSINGS) ×2
DERMABOND ADVANCED .7 DNX12 (GAUZE/BANDAGES/DRESSINGS) ×6 IMPLANT
DIFFUSER DRILL AIR PNEUMATIC (MISCELLANEOUS) ×3 IMPLANT
DILATOR INSULATED LLIF 8-13-18 (NEUROSURGERY SUPPLIES) ×1 IMPLANT
DRAPE C-ARM 42X72 X-RAY (DRAPES) ×4 IMPLANT
DRAPE C-ARMOR (DRAPES) ×3 IMPLANT
DRAPE HALF SHEET 40X57 (DRAPES) IMPLANT
DRAPE LAPAROTOMY 100X72X124 (DRAPES) ×6 IMPLANT
DRAPE POUCH INSTRU U-SHP 10X18 (DRAPES) ×3 IMPLANT
DRAPE SURG 17X23 STRL (DRAPES) ×9 IMPLANT
DRESSING OPSITE X SMALL 2X3 (GAUZE/BANDAGES/DRESSINGS) ×1 IMPLANT
DRILL 5.0/4.0 (BIT) ×3
DRSG OPSITE POSTOP 3X4 (GAUZE/BANDAGES/DRESSINGS) ×1 IMPLANT
DRSG OPSITE POSTOP 4X6 (GAUZE/BANDAGES/DRESSINGS) ×1 IMPLANT
ELECT BLADE 4.0 EZ CLEAN MEGAD (MISCELLANEOUS) ×3
ELECT KIT SAFEOP EMG/NMJ NDL (KITS) ×3
ELECT REM PT RETURN 9FT ADLT (ELECTROSURGICAL) ×6
ELECTRODE BLDE 4.0 EZ CLN MEGD (MISCELLANEOUS) IMPLANT
ELECTRODE KT SAFOP EMG/NMJ NDL (KITS) IMPLANT
ELECTRODE REM PT RTRN 9FT ADLT (ELECTROSURGICAL) ×4 IMPLANT
EVACUATOR 1/8 PVC DRAIN (DRAIN) ×1 IMPLANT
GAUZE 4X4 16PLY RFD (DISPOSABLE) IMPLANT
GAUZE SPONGE 4X4 12PLY STRL (GAUZE/BANDAGES/DRESSINGS) ×3 IMPLANT
GLOVE BIO SURGEON STRL SZ7 (GLOVE) ×1 IMPLANT
GLOVE BIO SURGEON STRL SZ8 (GLOVE) ×6 IMPLANT
GLOVE BIOGEL PI IND STRL 7.0 (GLOVE) IMPLANT
GLOVE BIOGEL PI INDICATOR 7.0 (GLOVE) ×1
GLOVE EXAM NITRILE XL STR (GLOVE) IMPLANT
GLOVE INDICATOR 8.5 STRL (GLOVE) ×6 IMPLANT
GOWN STRL REUS W/ TWL LRG LVL3 (GOWN DISPOSABLE) ×2 IMPLANT
GOWN STRL REUS W/ TWL XL LVL3 (GOWN DISPOSABLE) ×6 IMPLANT
GOWN STRL REUS W/TWL 2XL LVL3 (GOWN DISPOSABLE) IMPLANT
GOWN STRL REUS W/TWL LRG LVL3 (GOWN DISPOSABLE) ×3
GOWN STRL REUS W/TWL XL LVL3 (GOWN DISPOSABLE) ×2
GRAFT BNE MATRIX VG FRMBL MD 5 (Bone Implant) IMPLANT
HEMOSTAT POWDER KIT SURGIFOAM (HEMOSTASIS) ×2 IMPLANT
K-WIRE TROCAR TIP 320 (WIRE) ×3
KIT BASIN OR (CUSTOM PROCEDURE TRAY) ×6 IMPLANT
KIT TURNOVER KIT B (KITS) ×6 IMPLANT
KNIFE ANNULOTOMY (BLADE) ×1 IMPLANT
KWIRE TROCAR TIP 320 (WIRE) IMPLANT
LIF ILLUMINATION SYSTEM STERIL (SYSTAGENIX WOUND MANAGEMENT) ×3
NDL HYPO 21X1.5 SAFETY (NEEDLE) IMPLANT
NDL HYPO 25X1 1.5 SAFETY (NEEDLE) ×2 IMPLANT
NDL SPNL 22GX3.5 QUINCKE BK (NEEDLE) ×2 IMPLANT
NEEDLE HYPO 21X1.5 SAFETY (NEEDLE) ×3 IMPLANT
NEEDLE HYPO 22GX1.5 SAFETY (NEEDLE) ×4 IMPLANT
NEEDLE HYPO 25X1 1.5 SAFETY (NEEDLE) ×6 IMPLANT
NEEDLE SPNL 22GX3.5 QUINCKE BK (NEEDLE) ×3 IMPLANT
NS IRRIG 1000ML POUR BTL (IV SOLUTION) ×5 IMPLANT
OIL CARTRIDGE MAESTRO DRILL (MISCELLANEOUS) ×3
PACK LAMINECTOMY NEURO (CUSTOM PROCEDURE TRAY) ×6 IMPLANT
PROBE BALL TIP LLIF SAFEOP (NEUROSURGERY SUPPLIES) ×1 IMPLANT
ROD 40MM SPINAL (Rod) ×2 IMPLANT
SCREW AMP MODULAR CREO 6.5X45 (Screw) ×2 IMPLANT
SCREW MOD 6.0-5.0X35MM (Screw) IMPLANT
SCREW PA THRD CREO TULIP 5.5X4 (Head) ×4 IMPLANT
SHAFT CREO 30MM (Neuro Prosthesis/Implant) ×2 IMPLANT
SHIM ITRADISCAL BATTALION LLIF (MISCELLANEOUS) IMPLANT
SPACER IDENTI TI 4X22X45 10D (Spacer) ×1 IMPLANT
SPONGE LAP 4X18 RFD (DISPOSABLE) IMPLANT
SPONGE SURGIFOAM ABS GEL 100 (HEMOSTASIS) ×1 IMPLANT
SPONGE SURGIFOAM ABS GEL SZ50 (HEMOSTASIS) ×2 IMPLANT
STRIP CLOSURE SKIN 1/2X4 (GAUZE/BANDAGES/DRESSINGS) ×5 IMPLANT
SUT VIC AB 0 CT1 18XCR BRD8 (SUTURE) ×2 IMPLANT
SUT VIC AB 0 CT1 8-18 (SUTURE) ×2
SUT VIC AB 2-0 CT1 18 (SUTURE) ×7 IMPLANT
SUT VICRYL 4-0 PS2 18IN ABS (SUTURE) ×7 IMPLANT
SWABSTK COMLB BENZOIN TINCTURE (MISCELLANEOUS) ×3 IMPLANT
SYR CONTROL 10ML LL (SYRINGE) ×1 IMPLANT
SYSTEM ILLUMINATION LIF STERIL (SYSTAGENIX WOUND MANAGEMENT) IMPLANT
TOWEL GREEN STERILE (TOWEL DISPOSABLE) ×6 IMPLANT
TOWEL GREEN STERILE FF (TOWEL DISPOSABLE) ×6 IMPLANT
TRAY FOLEY MTR SLVR 16FR STAT (SET/KITS/TRAYS/PACK) ×3 IMPLANT
WATER STERILE IRR 1000ML POUR (IV SOLUTION) ×5 IMPLANT

## 2018-08-14 NOTE — Op Note (Signed)
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  Through separate incision skin incision posterior exploration fusion move of hardware L5-S1 and cortical and pedicle screw fixation L2-3 utilizing the globus Creo amp modular screw set  3.  Posterior lateral arthrodesis L2-3 utilizing vivigen and osteocel pro  Surgeon: Dominica Severin Lalena Salas  Assistant: Nash Shearer  Anesthesia: General  EBL: Minimal  HPI: 63 year old female with longstanding back pain previously undergone L3-L5 fusion with revision for pseudoarthrosis imaging showed solid fusion through all left throughout all levels but progressive degeneration deterioration at L2-3.  Patient had progressive worsening back pain with progression and due to her failed conservative treatment imaging findings and progression of clinical syndrome I recommended anterior lateral interbody fusion at L2-3 with posterior augmentation and expiration fusion removal of hardware L5-S1 I extensively went over the risks and benefits of the operation with her as well as perioperative course expectations of outcome and alternatives of surgery and she understood and agreed to proceed forward.  Operative procedure: Patient was brought into the OR was due to general anesthesia without paralytics initially was positioned in the left lateral decubitus left side up position and under fluoroscopy we positioned her on the bed exposing the entry points at L2-3 with 2 incision technique both lateral and slightly posterior to gain access to the retroperitoneal space.  After adequate positioning the left side of her back was prepped and draped in routine sterile fashion 2 incisions were opened up 1  laterally 1 posteriorly through the posterior incision I pierced the lumbodorsal fascia palpated the TP so my finger up freeing up the retroperitoneal space I felt the undersurface of the rib.  I passed the dilator along my finger down to the lateral aspect of the vertebral body along the psoas.  Utilizing fluoroscopy I positioned myself in the posterior one third of the disc base we stimulated sequentially and had all values greater than 20.  Then with through sequential dilation with continuous stimulation placed the retractor and open the retractor up and directly visualize the space.  After again confirming safe zones with monitoring I deployed the shim opened up the retractor and started the discectomy.  Utilizing sequential increase paddles worked up to a 8 mm 22 lordotic trial that felt to have good opening up of the of the disc space and apposition of the endplates so we selected that for the size of the cage.  After adequate discectomy and endplate preparation packed the cage with the ostia cell and then placed the cage under fluoroscopy.  Postop fluoroscopy confirmed good position then the wound scopes irrigated meticulous hemostasis was maintained both incisions were then closed up with interrupted Vicryl in a running 4 subcuticular.  Patient was then repositioned prone on the Wilson frame for the second part of the procedure.  Her old incision was opened up and extended slightly cephalad the scar tissue was dissected free and dissection carried down to expose the hardware at L5-S1.  I exposed the fusion mass posterior laterally starting at L4 and extending up beyond L3 to the lateral pars and facet joints at L2.  The fusion did appear to be solid I remove the hardware all the hardware was solid.  Then under fluoroscopy I selected cortical screw entry points via AP trajectory at L2 attempted to  identify cortical screw entry points at L3 however due to the fusion mass I was unable to pay place screws in  a cortical position there so I converted back to pedicle screws at L3.  Placed 2 pedicle screws under fluoroscopy 6.5 mm x 45 mm length screws at L3 30 mm 6050 cortical screws at L2.  Then assembled the heads aggressively decorticated the facet joints lateral pars and TP L2-3 packed with locally harvested autograft mix posterior laterally then placed to rods anchored everything in place placed a Hemovac drain injected Exparel in the fascia and after fluoroscopy confirmed good position of the implants the wound was closed with interrupted Vicryl in a running 4 subcuticular.  Dermabond benzoin Steri-Strips and a sterile dressing was applied patient recovery in stable condition.  At the end the case on needle count sponge counts were correct.

## 2018-08-14 NOTE — Progress Notes (Signed)
Pt hypertensive this morning.BP=190/100 (rechecked manually.). Pt is  Not on any BP meds. Per pt was on HTZ about 10 yrs back. Pain level =8/10. All other VSS. MD notified.

## 2018-08-14 NOTE — Anesthesia Preprocedure Evaluation (Addendum)
Anesthesia Evaluation  Patient identified by MRN, date of birth, ID band Patient awake    Reviewed: Allergy & Precautions, NPO status , Patient's Chart, lab work & pertinent test results  Airway Mallampati: III  TM Distance: >3 FB Neck ROM: Full    Dental  (+) Teeth Intact, Dental Advisory Given   Pulmonary former smoker,    breath sounds clear to auscultation       Cardiovascular hypertension,  Rhythm:Regular Rate:Normal     Neuro/Psych    GI/Hepatic   Endo/Other    Renal/GU      Musculoskeletal   Abdominal   Peds  Hematology   Anesthesia Other Findings   Reproductive/Obstetrics                             Anesthesia Physical Anesthesia Plan  ASA: III  Anesthesia Plan: General   Post-op Pain Management:    Induction: Intravenous  PONV Risk Score and Plan: Ondansetron and Dexamethasone  Airway Management Planned: Oral ETT  Additional Equipment:   Intra-op Plan:   Post-operative Plan: Extubation in OR  Informed Consent: I have reviewed the patients History and Physical, chart, labs and discussed the procedure including the risks, benefits and alternatives for the proposed anesthesia with the patient or authorized representative who has indicated his/her understanding and acceptance.     Dental advisory given  Plan Discussed with: CRNA and Anesthesiologist  Anesthesia Plan Comments:         Anesthesia Quick Evaluation

## 2018-08-14 NOTE — Anesthesia Procedure Notes (Signed)
Procedure Name: Intubation Date/Time: 08/14/2018 7:39 AM Performed by: Jearld Pies, CRNA Pre-anesthesia Checklist: Patient identified, Emergency Drugs available, Suction available and Patient being monitored Patient Re-evaluated:Patient Re-evaluated prior to induction Oxygen Delivery Method: Circle System Utilized Preoxygenation: Pre-oxygenation with 100% oxygen Induction Type: IV induction, Rapid sequence and Cricoid Pressure applied Laryngoscope Size: Mac and 3 Grade View: Grade I Tube type: Oral Tube size: 7.0 mm Number of attempts: 1 Airway Equipment and Method: Stylet and Oral airway Placement Confirmation: ETT inserted through vocal cords under direct vision,  positive ETCO2 and breath sounds checked- equal and bilateral Secured at: 20 cm Tube secured with: Tape Dental Injury: Teeth and Oropharynx as per pre-operative assessment

## 2018-08-14 NOTE — Anesthesia Postprocedure Evaluation (Signed)
Anesthesia Post Note  Patient: Lauren Briggs  Procedure(s) Performed: extreme lateral interbody fusion - Lumbar two-Lumbar three  cortical screws and removal pedicle screws Lumbar -Sacral one (N/A ) Removal pedicle screws Lumbar -Sacral one with cortical screw fixation lumbar two-three (N/A Back)     Patient location during evaluation: PACU Anesthesia Type: General Level of consciousness: awake and alert Pain management: pain level controlled Vital Signs Assessment: post-procedure vital signs reviewed and stable Respiratory status: spontaneous breathing, nonlabored ventilation, respiratory function stable and patient connected to nasal cannula oxygen Cardiovascular status: blood pressure returned to baseline and stable Postop Assessment: no apparent nausea or vomiting Anesthetic complications: no    Last Vitals:  Vitals:   08/14/18 1351 08/14/18 1407  BP: (!) 168/87   Pulse: (!) 104   Resp: 16   Temp: (!) 36.4 C 36.5 C  SpO2: 96%     Last Pain:  Vitals:   08/14/18 1407  TempSrc: Oral  PainSc:                  [name]

## 2018-08-14 NOTE — Transfer of Care (Signed)
Immediate Anesthesia Transfer of Care Note  Patient: Lauren Briggs  Procedure(s) Performed: extreme lateral interbody fusion - Lumbar two-Lumbar three  cortical screws and removal pedicle screws Lumbar -Sacral one (N/A ) Removal pedicle screws Lumbar -Sacral one with cortical screw fixation lumbar two-three (N/A Back)  Patient Location: PACU  Anesthesia Type:General  Level of Consciousness: drowsy, patient cooperative and responds to stimulation  Airway & Oxygen Therapy: Patient Spontanous Breathing and Patient connected to face mask oxygen  Post-op Assessment: Report given to RN and Post -op Vital signs reviewed and stable  Post vital signs: Reviewed and stable  Last Vitals:  Vitals Value Taken Time  BP 148/82   Temp    Pulse 88 08/14/18 1228  Resp 14 08/14/18 1228  SpO2 100 % 08/14/18 1228  Vitals shown include unvalidated device data.  Last Pain:  Vitals:   08/14/18 0624  TempSrc:   PainSc: 8       Patients Stated Pain Goal: 3 (60/15/61 5379)  Complications: No apparent anesthesia complications

## 2018-08-14 NOTE — Social Work (Signed)
CSW acknowledging consult for SNF placement. Will follow for therapy recommendations.   Dex Blakely, MSW, LCSWA [place] Clinical Social Work [phone]   

## 2018-08-14 NOTE — H&P (Signed)
Lauren Briggs is an 63 y.o. female.   Chief Complaint: Back and left groin and lower leg pain HPI: 63 year old female with longstanding back and bilateral leg pain worse on the left.  Work-up has shown progressive segmental degeneration above her previous L3-S1 fusion with solid fusion from L3-S1.  So we have recommended anterior lateral interbody fusion at L2-3 with posterior augmentation with cortical screws and removal of L5-S1 pedicle screws.  I have extensively gone over the risks and benefits of the operation with her as well as perioperative course expectations of outcome and alternatives of surgery and she understands and agrees to proceed forward.  Past Medical History:  Diagnosis Date  . Allergy   . Arthritis   . Complication of anesthesia    HX Back CHIPPED TOOTH S/P EXTUBATION   . Hyperlipidemia   . Hypertension   . Low back pain radiating to right leg   . Neuromuscular disorder ([place])   . Shortness of breath dyspnea     Past Surgical History:  Procedure Laterality Date  . ABDOMINAL HYSTERECTOMY     PARTIAL  . APPENDECTOMY    . BACK SURGERY     times 4  . CARPAL TUNNEL RELEASE     BIL   . CESAREAN SECTION     PARTIAL HYST  . CLOSED REDUCTION ULNAR SHAFT    . COLONOSCOPY    . FOOT SURGERY     LT FOOT CYST REMOVED   . NASAL SINUS SURGERY     1994   . radial head Right 01/14/2016   at [facility]  . RADIAL HEAD ARTHROPLASTY Right 01/26/2016   Procedure: RIGHT RADIAL HEAD REPLACEMENT ARTHROPLASTY WITH LIGAMENT REPAIRS;  Surgeon: Charlotte Crumb, MD;  Location: [place];  Service: Orthopedics;  Laterality: Right;  Right elbow raial head replacement with possible ligament repairs as needed   . UPPER GASTROINTESTINAL ENDOSCOPY      Family History  Problem Relation Age of Onset  . Colon cancer Neg Hx   . Colon polyps Neg Hx   . Esophageal cancer Neg Hx   . Stomach cancer Neg Hx   . Rectal cancer Neg Hx    Social History:  reports that she quit smoking  about 39 years ago. Her smoking use included cigarettes. She quit smokeless tobacco use about 39 years ago. She reports that she does not drink alcohol or use drugs.  Allergies:  Allergies  Allergen Reactions  . Propoxyphene Hcl Palpitations    Facility-Administered Medications Prior to Admission  Medication Dose Route Frequency Provider Last Rate Last Dose  . 0.9 %  sodium chloride infusion  500 mL Intravenous Once Ladene Artist, MD       Medications Prior to Admission  Medication Sig Dispense Refill  . DULoxetine (CYMBALTA) 60 MG capsule Take 60 mg by mouth daily.   1  . estradiol (ESTRACE) 1 MG tablet Take 1 mg by mouth every morning.     Marland Kitchen HYDROcodone-acetaminophen (NORCO/VICODIN) 5-325 MG tablet Take 1 tablet by mouth every 4 (four) hours as needed for moderate pain.   0  . Multiple Vitamin (MULTIVITAMIN WITH MINERALS) TABS Take 1 tablet by mouth daily after supper. Women's 50 plus      No results found for this or any previous visit (from the past 48 hour(s)). No results found.  Review of Systems  Musculoskeletal: Positive for back pain.  Neurological: Positive for tingling and sensory change.    Blood pressure (!) 190/90, pulse 89,  temperature 98.3 F (36.8 C), temperature source Oral, resp. rate 20, height 5\' 4"  (1.626 m), weight 111.1 kg, SpO2 99 %. Physical Exam  Constitutional: She is oriented to person, place, and time. She appears well-developed.  HENT:  Head: Normocephalic.  Eyes: Pupils are equal, round, and reactive to light.  Neck: Normal range of motion.  Cardiovascular: Normal rate.  Respiratory: Effort normal.  GI: Soft. Bowel sounds are normal.  Neurological: She is alert and oriented to person, place, and time. She has normal strength. GCS eye subscore is 4. GCS verbal subscore is 5. GCS motor subscore is 6.  Patient is awake and alert strength 5-5 iliopsoas, quads, hamstrings, gastrocs, into tibialis, and EHL.  Skin: Skin is warm and dry.      Assessment/Plan 63 year old female presents for an L2-3 anterior lateral interbody fusion with posterior cortical screws and removal of hardware L5-S1  Jorge Retz P, MD 08/14/2018, 7:20 AM

## 2018-08-15 MED ORDER — HYDROCODONE-ACETAMINOPHEN 5-325 MG PO TABS: 1.0000 | ORAL_TABLET | ORAL | 0 refills | Status: DC | PRN

## 2018-08-15 MED ORDER — CYCLOBENZAPRINE HCL 10 MG PO TABS: 10.0000 mg | ORAL_TABLET | Freq: Three times a day (TID) | ORAL | 0 refills | Status: DC | PRN

## 2018-08-15 NOTE — Progress Notes (Signed)
Pt with d/d orders. Discharge paperwork reviewed with pt and all questions answered. Prescriptions faxed to pharmacy. IV removed. Pt escorted out via wheelchair with all belongings.

## 2018-08-15 NOTE — Evaluation (Signed)
Physical Therapy Evaluation Patient Details Name: Lauren Briggs MRN: [id number] DOB: 03/18/1955 Today's Date: 08/15/2018   History of Present Illness  Pt is a 63 y.o. female s/p ALIF. PMH consists of multiple back surgeries.  Clinical Impression  PT eval complete. Pt is modified independent/independent with all functional mobility. She independently recalled 3/3 back precautions and is able to don/doff brace independently. No further skilled PT intervention indicated. PT signing off.    Follow Up Recommendations No PT follow up    Equipment Recommendations  None recommended by PT    Recommendations for Other Services       Precautions / Restrictions Precautions Precautions: Back Precaution Comments: Pt independently recalled 3/3 back precautions. Required Braces or Orthoses: Spinal Brace Spinal Brace: Lumbar corset;Applied in sitting position Restrictions Weight Bearing Restrictions: No Other Position/Activity Restrictions: Pt independent with don/doff brace.      Mobility  Bed Mobility Overal bed mobility: Modified Independent             General bed mobility comments: Pt has craftmatic bed at home.  Transfers Overall transfer level: Independent Equipment used: None                Ambulation/Gait Ambulation/Gait assistance: Independent Gait Distance (Feet): 300 Feet Assistive device: None Gait Pattern/deviations: WFL(Within Functional Limits) Gait velocity: WFL Gait velocity interpretation: >2.62 ft/sec, indicative of community ambulatory General Gait Details: steady gait  Stairs Stairs: (Pt declined need for stair training. Reports no questions or concerns.)          Wheelchair Mobility    Modified Rankin (Stroke Patients Only)       Balance Overall balance assessment: No apparent balance deficits (not formally assessed)                                           Pertinent Vitals/Pain Pain Assessment: 0-10 Pain Score:  3  Pain Location: sx site Pain Descriptors / Indicators: Sore Pain Intervention(s): Monitored during session;Repositioned;Premedicated before session    Home Living Family/patient expects to be discharged to:: Private residence Living Arrangements: Spouse/significant other Available Help at Discharge: Family;Available 24 hours/day Type of Home: House Home Access: Stairs to enter Entrance Stairs-Rails: Can reach both Entrance Stairs-Number of Steps: 3 Home Layout: Two level;Able to live on main level with bedroom/bathroom Home Equipment: Gilford Rile - 2 wheels;Shower seat      Prior Function Level of Independence: Independent         Comments: paralegal part time     Hand Dominance   Dominant Hand: Right    Extremity/Trunk Assessment   Upper Extremity Assessment Upper Extremity Assessment: Overall WFL for tasks assessed    Lower Extremity Assessment Lower Extremity Assessment: Overall WFL for tasks assessed    Cervical / Trunk Assessment Cervical / Trunk Assessment: Other exceptions Cervical / Trunk Exceptions: s/p ALIF  Communication   Communication: No difficulties  Cognition Arousal/Alertness: Awake/alert Behavior During Therapy: WFL for tasks assessed/performed Overall Cognitive Status: Within Functional Limits for tasks assessed                                        General Comments      Exercises     Assessment/Plan    PT Assessment Patent does not need any further PT services  PT Problem  List         PT Treatment Interventions      PT Goals (Current goals can be found in the Care Plan section)  Acute Rehab PT Goals Patient Stated Goal: home ASAP PT Goal Formulation: All assessment and education complete, DC therapy    Frequency     Barriers to discharge        Co-evaluation               AM-PAC PT "6 Clicks" Mobility  Outcome Measure Help needed turning from your back to your side while in a flat bed without  using bedrails?: None Help needed moving from lying on your back to sitting on the side of a flat bed without using bedrails?: None Help needed moving to and from a bed to a chair (including a wheelchair)?: None Help needed standing up from a chair using your arms (e.g., wheelchair or bedside chair)?: None Help needed to walk in hospital room?: None Help needed climbing 3-5 steps with a railing? : None 6 Click Score: 24    End of Session Equipment Utilized During Treatment: Back brace Activity Tolerance: Patient tolerated treatment well Patient left: in bed;with call bell/phone within reach Nurse Communication: Mobility status PT Visit Diagnosis: Pain;Other abnormalities of gait and mobility (R26.89)    Time: 6734-1937 PT Time Calculation (min) (ACUTE ONLY): 14 min   Charges:   PT Evaluation $PT Eval Low Complexity: 1 Low          Lorrin Goodell, PT  Office # [phone] Pager [phone]   Lorriane Shire 08/15/2018, 10:29 AM

## 2018-08-15 NOTE — Progress Notes (Signed)
OT Cancellation Note  Patient Details Name: Lauren Briggs MRN: [id number] DOB: 09-Jan-1956   Cancelled Treatment:    Reason Eval/Treat Not Completed: OT screened, no needs identified, will sign off. Per PT patient independent (multiple back surgeries) and is familiar with back precautions/ADl compensatory techniques. No OT needs identified.   Delight Stare, OT Acute Rehabilitation Services Pager [phone] Office [phone]  Delight Stare 08/15/2018, 10:53 AM

## 2018-08-15 NOTE — Discharge Summary (Signed)
Physician Discharge Summary     Providing Compassionate, Quality Care - Together  Patient ID: Lauren Briggs MRN: [id number] DOB/AGE: 63-19-1957 63 y.o.  Admit date: 08/14/2018 Discharge date: 08/15/2018  Admission Diagnoses: Spinal stenosis lumbar region  Discharge Diagnoses:  Active Problems:   Spinal stenosis of lumbar region   Discharged Condition: good  Hospital Course: Patient was admitted 08/14/2018 following anterior lateral interbody fusion and posterior lateral arthrodesis at L2-3. She has done well post operatively. She worked with physical and occupational therapies, who have no further recommendations. Her pain is well-controlled. She is ready to discharge home.  Consults: rehabilitation medicine   Significant Diagnostic Studies: radiology: Dg Lumbar Spine 2-3 Views  Result Date: 08/14/2018 CLINICAL DATA:  L2-3 XLIF with posterior hardware. EXAM: DG C-ARM 61-120 MIN; LUMBAR SPINE - 2-3 VIEW COMPARISON:  CT 05/14/2018 FINDINGS: Examination demonstrates evidence of patient's interbody fusion at the L3-4 and L4-5 levels with hardware over the posterior elements at the L4 level. There is mild spondylosis of the lumbar spine with normal alignment. Evidence of new interbody fusion at the L2-3 level with posterior pedicle screws placed at the L2-3 level. IMPRESSION: New interbody fusion at the L2-3 level with bilateral posterior pedicle screws intact at the L2 and L3 level level. Electronically Signed   By: Marin Olp M.D.   On: 08/14/2018 12:13   Dg C-arm 1-60 Min  Result Date: 08/14/2018 CLINICAL DATA:  L2-3 XLIF with posterior hardware. EXAM: DG C-ARM 61-120 MIN; LUMBAR SPINE - 2-3 VIEW COMPARISON:  CT 05/14/2018 FINDINGS: Examination demonstrates evidence of patient's interbody fusion at the L3-4 and L4-5 levels with hardware over the posterior elements at the L4 level. There is mild spondylosis of the lumbar spine with normal alignment. Evidence of new interbody fusion at the  L2-3 level with posterior pedicle screws placed at the L2-3 level. IMPRESSION: New interbody fusion at the L2-3 level with bilateral posterior pedicle screws intact at the L2 and L3 level level. Electronically Signed   By: Marin Olp M.D.   On: 08/14/2018 12:13    Treatments: surgery: Anterior lateral interbody fusion utilizing the Alphatec titanium cage packed with osteocel pro with intraoperative neuro monitoring under fluoroscopy. Through separate incision skin incision posterior exploration fusion move of hardware L5-S1 and cortical and pedicle screw fixation L2-3 utilizing the globus Creo amp modular screw set. Posterior lateral arthrodesis L2-3 utilizing vivigen and osteocel pro  Discharge Exam: Blood pressure (!) 133/57, pulse 88, temperature 98.2 F (36.8 C), temperature source Oral, resp. rate 16, height 5\' 4"  (1.626 m), weight 111.1 kg, SpO2 96 %.   Alert and oriented x 4 PERRLA CN II-XII grossly intact MAE, Strength 5/5 BUE, BLE Incisions clean, dry, and intact  Disposition: Discharge disposition: 01-Home or Self Care        Allergies as of 08/15/2018      Reactions   Propoxyphene Hcl Palpitations      Medication List    TAKE these medications   cyclobenzaprine 10 MG tablet Commonly known as: FLEXERIL Take 1 tablet (10 mg total) by mouth 3 (three) times daily as needed for muscle spasms.   DULoxetine 60 MG capsule Commonly known as: CYMBALTA Take 60 mg by mouth daily.   estradiol 1 MG tablet Commonly known as: ESTRACE Take 1 mg by mouth every morning.   HYDROcodone-acetaminophen 5-325 MG tablet Commonly known as: NORCO/VICODIN Take 1-2 tablets by mouth every 4 (four) hours as needed for moderate pain. What changed: how much to take  multivitamin with minerals Tabs tablet Take 1 tablet by mouth daily after supper. Women's 50 plus      Follow-up Information    Kary Kos, MD. Schedule an appointment as soon as possible for a visit in 2 week(s).    Specialty: Neurosurgery Contact information: [address] [place] [address] [phone]           Signed: Patricia Nettle 08/15/2018, 11:13 AM

## 2018-08-18 ENCOUNTER — Encounter (HOSPITAL_COMMUNITY): Payer: Self-pay | Admitting: Neurosurgery

## 2018-09-22 DIAGNOSIS — M47816 Spondylosis without myelopathy or radiculopathy, lumbar region: Secondary | ICD-10-CM | POA: Diagnosis not present

## 2018-11-02 DIAGNOSIS — Z1231 Encounter for screening mammogram for malignant neoplasm of breast: Secondary | ICD-10-CM | POA: Diagnosis not present

## 2018-11-02 LAB — HM MAMMOGRAPHY: HM Mammogram: NORMAL (ref 0–4)

## 2018-11-03 DIAGNOSIS — M47816 Spondylosis without myelopathy or radiculopathy, lumbar region: Secondary | ICD-10-CM | POA: Insufficient documentation

## 2018-11-03 DIAGNOSIS — N951 Menopausal and female climacteric states: Secondary | ICD-10-CM | POA: Diagnosis not present

## 2018-11-03 DIAGNOSIS — Z01419 Encounter for gynecological examination (general) (routine) without abnormal findings: Secondary | ICD-10-CM | POA: Diagnosis not present

## 2018-11-03 DIAGNOSIS — I739 Peripheral vascular disease, unspecified: Secondary | ICD-10-CM | POA: Insufficient documentation

## 2018-11-03 DIAGNOSIS — L900 Lichen sclerosus et atrophicus: Secondary | ICD-10-CM | POA: Diagnosis not present

## 2018-11-03 DIAGNOSIS — R232 Flushing: Secondary | ICD-10-CM | POA: Diagnosis not present

## 2018-11-03 DIAGNOSIS — Z79890 Hormone replacement therapy (postmenopausal): Secondary | ICD-10-CM | POA: Diagnosis not present

## 2018-11-03 DIAGNOSIS — M5137 Other intervertebral disc degeneration, lumbosacral region: Secondary | ICD-10-CM | POA: Insufficient documentation

## 2018-11-04 ENCOUNTER — Other Ambulatory Visit (HOSPITAL_COMMUNITY): Payer: Self-pay | Admitting: Neurosurgery

## 2018-11-04 ENCOUNTER — Telehealth (HOSPITAL_COMMUNITY): Payer: Self-pay

## 2018-11-04 DIAGNOSIS — I739 Peripheral vascular disease, unspecified: Secondary | ICD-10-CM

## 2018-11-04 NOTE — Telephone Encounter (Signed)

## 2018-11-05 ENCOUNTER — Other Ambulatory Visit: Payer: Self-pay

## 2018-11-05 ENCOUNTER — Ambulatory Visit (HOSPITAL_COMMUNITY)
Admission: RE | Admit: 2018-11-05 | Discharge: 2018-11-05 | Disposition: A | Discharge status: Home / Self Care | Payer: BC Managed Care – PPO | Source: Ambulatory Visit | Attending: Family | Admitting: Family

## 2018-11-05 DIAGNOSIS — I739 Peripheral vascular disease, unspecified: Secondary | ICD-10-CM | POA: Insufficient documentation

## 2018-11-05 NOTE — Progress Notes (Signed)
Dr. Windy Carina office notified of elevated systolic blood pressure at 10:14 am. Patient instructed by Dr. Windy Carina to contact PCP ASAP to schedule appointment for follow up.

## 2018-11-09 ENCOUNTER — Encounter: Payer: Self-pay | Admitting: Family Medicine

## 2018-11-09 ENCOUNTER — Other Ambulatory Visit: Payer: Self-pay

## 2018-11-09 ENCOUNTER — Ambulatory Visit: Payer: BC Managed Care – PPO | Admitting: Family Medicine

## 2018-11-09 VITALS — BP 180/110 | HR 90 | Ht 63.5 in | Wt 246.6 lb

## 2018-11-09 DIAGNOSIS — R739 Hyperglycemia, unspecified: Secondary | ICD-10-CM

## 2018-11-09 DIAGNOSIS — E785 Hyperlipidemia, unspecified: Secondary | ICD-10-CM | POA: Diagnosis not present

## 2018-11-09 DIAGNOSIS — R9431 Abnormal electrocardiogram [ECG] [EKG]: Secondary | ICD-10-CM

## 2018-11-09 DIAGNOSIS — Z8249 Family history of ischemic heart disease and other diseases of the circulatory system: Secondary | ICD-10-CM

## 2018-11-09 DIAGNOSIS — I10 Essential (primary) hypertension: Secondary | ICD-10-CM | POA: Diagnosis not present

## 2018-11-09 MED ORDER — LISINOPRIL-HYDROCHLOROTHIAZIDE 10-12.5 MG PO TABS: 1.0000 | ORAL_TABLET | Freq: Every day | ORAL | 2 refills | Status: DC

## 2018-11-09 NOTE — Patient Instructions (Addendum)
Start on the medication for your blood pressure.  Check your BP at home and keep a record of your readings.   Goal BP is 130/80 or less.   Watch your sodium intake. Read nutrition labels and try to limit your sodium to 2,300 mg per day.   I am referring you to [facility]. They will call you.   We will call you with your labs results.   Follow up in 4 weeks for HTN and bring in your readings and BP machine please.      DASH Eating Plan DASH stands for "Dietary Approaches to Stop Hypertension." The DASH eating plan is a healthy eating plan that has been shown to reduce high blood pressure (hypertension). It may also reduce your risk for type 2 diabetes, heart disease, and stroke. The DASH eating plan may also help with weight loss. What are tips for following this plan?  General guidelines  Avoid eating more than 2,300 mg (milligrams) of salt (sodium) a day. If you have hypertension, you may need to reduce your sodium intake to 1,500 mg a day.  Limit alcohol intake to no more than 1 drink a day for nonpregnant women and 2 drinks a day for men. One drink equals 12 oz of beer, 5 oz of wine, or 1 oz of hard liquor.  Work with your health care provider to maintain a healthy body weight or to lose weight. Ask what an ideal weight is for you.  Get at least 30 minutes of exercise that causes your heart to beat faster (aerobic exercise) most days of the week. Activities may include walking, swimming, or biking.  Work with your health care provider or diet and nutrition specialist (dietitian) to adjust your eating plan to your individual calorie needs. Reading food labels   Check food labels for the amount of sodium per serving. Choose foods with less than 5 percent of the Daily Value of sodium. Generally, foods with less than 300 mg of sodium per serving fit into this eating plan.  To find whole grains, look for the word "whole" as the first word in the ingredient list.  Shopping  Buy products labeled as "low-sodium" or "no salt added."  Buy fresh foods. Avoid canned foods and premade or frozen meals. Cooking  Avoid adding salt when cooking. Use salt-free seasonings or herbs instead of table salt or sea salt. Check with your health care provider or pharmacist before using salt substitutes.  Do not fry foods. Cook foods using healthy methods such as baking, boiling, grilling, and broiling instead.  Cook with heart-healthy oils, such as olive, canola, soybean, or sunflower oil. Meal planning  Eat a balanced diet that includes: ? 5 or more servings of fruits and vegetables each day. At each meal, try to fill half of your plate with fruits and vegetables. ? Up to 6-8 servings of whole grains each day. ? Less than 6 oz of lean meat, poultry, or fish each day. A 3-oz serving of meat is about the same size as a deck of cards. One egg equals 1 oz. ? 2 servings of low-fat dairy each day. ? A serving of nuts, seeds, or beans 5 times each week. ? Heart-healthy fats. Healthy fats called Omega-3 fatty acids are found in foods such as flaxseeds and coldwater fish, like sardines, salmon, and mackerel.  Limit how much you eat of the following: ? Canned or prepackaged foods. ? Food that is high in trans fat, such as fried  foods. ? Food that is high in saturated fat, such as fatty meat. ? Sweets, desserts, sugary drinks, and other foods with added sugar. ? Full-fat dairy products.  Do not salt foods before eating.  Try to eat at least 2 vegetarian meals each week.  Eat more home-cooked food and less restaurant, buffet, and fast food.  When eating at a restaurant, ask that your food be prepared with less salt or no salt, if possible. What foods are recommended? The items listed may not be a complete list. Talk with your dietitian about what dietary choices are best for you. Grains Whole-grain or whole-wheat bread. Whole-grain or whole-wheat pasta. Brown rice.  [place]. Bulgur. Whole-grain and low-sodium cereals. Pita bread. Low-fat, low-sodium crackers. Whole-wheat flour tortillas. Vegetables Fresh or frozen vegetables (raw, steamed, roasted, or grilled). Low-sodium or reduced-sodium tomato and vegetable juice. Low-sodium or reduced-sodium tomato sauce and tomato paste. Low-sodium or reduced-sodium canned vegetables. Fruits All fresh, dried, or frozen fruit. Canned fruit in natural juice (without added sugar). Meat and other protein foods Skinless chicken or [place]. Ground chicken or [place]. Pork with fat trimmed off. Fish and seafood. Egg whites. Dried beans, peas, or lentils. Unsalted nuts, nut butters, and seeds. Unsalted canned beans. Lean cuts of beef with fat trimmed off. Low-sodium, lean deli meat. Dairy Low-fat (1%) or fat-free (skim) milk. Fat-free, low-fat, or reduced-fat cheeses. Nonfat, low-sodium ricotta or cottage cheese. Low-fat or nonfat yogurt. Low-fat, low-sodium cheese. Fats and oils Soft margarine without trans fats. Vegetable oil. Low-fat, reduced-fat, or light mayonnaise and salad dressings (reduced-sodium). Canola, safflower, olive, soybean, and sunflower oils. Avocado. Seasoning and other foods Herbs. Spices. Seasoning mixes without salt. Unsalted popcorn and pretzels. Fat-free sweets. What foods are not recommended? The items listed may not be a complete list. Talk with your dietitian about what dietary choices are best for you. Grains Baked goods made with fat, such as croissants, muffins, or some breads. Dry pasta or rice meal packs. Vegetables Creamed or fried vegetables. Vegetables in a cheese sauce. Regular canned vegetables (not low-sodium or reduced-sodium). Regular canned tomato sauce and paste (not low-sodium or reduced-sodium). Regular tomato and vegetable juice (not low-sodium or reduced-sodium). Angie Fava. Olives. Fruits Canned fruit in a light or heavy syrup. Fried fruit. Fruit in cream or butter sauce. Meat  and other protein foods Fatty cuts of meat. Ribs. Fried meat. Berniece Salines. Sausage. Bologna and other processed lunch meats. Salami. Fatback. Hotdogs. Bratwurst. Salted nuts and seeds. Canned beans with added salt. Canned or smoked fish. Whole eggs or egg yolks. Chicken or [place] with skin. Dairy Whole or 2% milk, cream, and half-and-half. Whole or full-fat cream cheese. Whole-fat or sweetened yogurt. Full-fat cheese. Nondairy creamers. Whipped toppings. Processed cheese and cheese spreads. Fats and oils Butter. Stick margarine. Lard. Shortening. Ghee. Bacon fat. Tropical oils, such as coconut, palm kernel, or palm oil. Seasoning and other foods Salted popcorn and pretzels. Onion salt, garlic salt, seasoned salt, table salt, and sea salt. Worcestershire sauce. Tartar sauce. Barbecue sauce. Teriyaki sauce. Soy sauce, including reduced-sodium. Steak sauce. Canned and packaged gravies. Fish sauce. Oyster sauce. Cocktail sauce. Horseradish that you find on the shelf. Ketchup. Mustard. Meat flavorings and tenderizers. Bouillon cubes. Hot sauce and Tabasco sauce. Premade or packaged marinades. Premade or packaged taco seasonings. Relishes. Regular salad dressings. Where to find more information:  National Heart, Lung, and [place]: https://wilson-eaton.com/  American Heart Association: www.heart.org Summary  The DASH eating plan is a healthy eating plan that has been shown to reduce  high blood pressure (hypertension). It may also reduce your risk for type 2 diabetes, heart disease, and stroke.  With the DASH eating plan, you should limit salt (sodium) intake to 2,300 mg a day. If you have hypertension, you may need to reduce your sodium intake to 1,500 mg a day.  When on the DASH eating plan, aim to eat more fresh fruits and vegetables, whole grains, lean proteins, low-fat dairy, and heart-healthy fats.  Work with your health care provider or diet and nutrition specialist (dietitian) to adjust your eating  plan to your individual calorie needs. This information is not intended to replace advice given to you by your health care provider. Make sure you discuss any questions you have with your health care provider. Document Released: 12/27/2010 Document Revised: 12/20/2016 Document Reviewed: 01/01/2016 Elsevier Patient Education  2020 [employer].

## 2018-11-09 NOTE — Progress Notes (Signed)
Subjective:    Patient ID: Lauren Briggs, female    DOB: 1955-03-21, 63 y.o.   MRN: [id number]  HPI Chief Complaint  Patient presents with  . new pt    new pt blood pressure. readings. 208/94, 237/112, 227/102, 218/159, 216/107, declines flu shot   She is new to the practice and here to establish care.  Previous medical care:[facility].  Last there 4 years ago.   Other providers: OB/GYN- Eustaquio Maize at [facility]. Dr. Kary Kos  GI- Dr. Fuller Plan  Dr. Burney Gauze   History of elevated BP since the 1990s. States she took HCTZ for several years.  States she lost 70 lbs and no longer needed medication.  Stopped medication in 2003.  States her mother passed a few years ago and she gained the weight back.  Her BP has been elevated again since July 2020.   States her legs "ache all the time". States she had dopplers done and has not yet received her results.   Colonoscopy - January 2020. Due for recall in 10 years   Partial hysterectomy 1995. Has ovaries.  Endometriosis   History of elevated cholesterol - took atorvastatin 4 years ago but had muscle aches and fatigue from the statin.   States she saw a cardiologist in early 2000s. Had a stress test she recalls it being negative. Her father passed away from a heart attack at age 23. She has other family members with cardiac disease as well.    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  Denies smoking, drinking alcohol, drug use  Diet: unhealthy  Excerise: none   Denies fever, chills, dizziness, chest pain, palpitations, shortness of breath, abdominal pain, N/V/D, urinary symptoms, LE edema.  No orthopnea. No vision changes.    Reviewed allergies, medications, past medical, surgical, family, and social history.    Review of Systems Pertinent positives and negatives in the history of present illness.     Objective:   Physical Exam BP  (!) 180/110   Pulse 90   Ht 5' 3.5" (1.613 m)   Wt 246 lb 9.6 oz (111.9 kg)   BMI 43.00 kg/m   Alert and oriented and in no distress. Cardiac exam shows a regular rate and rhythm.  Lungs are clear to auscultation. Extremities without edema. DTRs symmetric and normal. Skin is warm and dry.      Assessment & Plan:  Essential hypertension - Plan: Comprehensive metabolic panel, EKG [id number], lisinopril-hydrochlorothiazide (ZESTORETIC) 10-12.5 MG tablet, Ambulatory referral to Cardiology -No sign of target and organ damage.  We will start her on lisinopril/HCTZ.  Counseling on low-sodium diet.  Discussed long-term health consequences associated with uncontrolled hypertension.  Discussed respiratory risk factor for heart disease and she is interested in seeing a cardiologist due to multiple risk factors and significant family history Check labs in follow-up in 2 weeks  Hyperlipidemia, unspecified hyperlipidemia type - Plan: Lipid panel -Check fasting lipids in follow-up.  Most likely she will need statin therapy based on her multiple risk factors for heart disease.  Morbid obesity ([place]) - Plan: Comprehensive metabolic panel, TSH, T4, free, Lipid panel, Hemoglobin A1c, Amb Ref to Medical Weight Management -She has lost and gained weight over the years.  I will refer her to [facility] management for assistance and weight loss and overall health  Elevated serum glucose - Plan: Comprehensive metabolic panel, TSH, T4, free, Hemoglobin A1c -Family history  of diabetes.  Follow-up pending lab results  Family history of heart disease in female family member before age 45 - Plan: EKG 12-Lead, Ambulatory referral to Cardiology -Refer to cardiology for further work-up  Abnormal EKG - Plan: Ambulatory referral to Cardiology -Reviewed by Dr. Redmond School and myself.  No acute changes.  She is asymptomatic

## 2018-11-10 ENCOUNTER — Encounter: Payer: Self-pay | Admitting: Family Medicine

## 2018-11-10 DIAGNOSIS — R7303 Prediabetes: Secondary | ICD-10-CM

## 2018-11-10 HISTORY — DX: Prediabetes: R73.03

## 2018-11-10 LAB — COMPREHENSIVE METABOLIC PANEL
ALT: 27 IU/L (ref 0–32)
AST: 24 IU/L (ref 0–40)
Albumin/Globulin Ratio: 1.6 (ref 1.2–2.2)
Albumin: 4.2 g/dL (ref 3.8–4.8)
Alkaline Phosphatase: 68 IU/L (ref 39–117)
BUN/Creatinine Ratio: 24 (ref 12–28)
BUN: 18 mg/dL (ref 8–27)
Bilirubin Total: 0.3 mg/dL (ref 0.0–1.2)
CO2: 23 mmol/L (ref 20–29)
Calcium: 9.5 mg/dL (ref 8.7–10.3)
Chloride: 99 mmol/L (ref 96–106)
Creatinine, Ser: 0.75 mg/dL (ref 0.57–1.00)
GFR calc Af Amer: 98 mL/min/{1.73_m2} (ref >59)
GFR calc non Af Amer: 85 mL/min/{1.73_m2} (ref >59)
Globulin, Total: 2.7 g/dL (ref 1.5–4.5)
Glucose: 109 mg/dL — ABNORMAL HIGH (ref 65–99)
Potassium: 4.6 mmol/L (ref 3.5–5.2)
Sodium: 136 mmol/L (ref 134–144)
Total Protein: 6.9 g/dL (ref 6.0–8.5)

## 2018-11-10 LAB — LIPID PANEL
Chol/HDL Ratio: 6.8 ratio — ABNORMAL HIGH (ref 0.0–4.4)
Cholesterol, Total: 332 mg/dL — ABNORMAL HIGH (ref 100–199)
HDL: 49 mg/dL (ref >39)
LDL Chol Calc (NIH): 164 mg/dL — ABNORMAL HIGH (ref 0–99)
Triglycerides: 597 mg/dL (ref 0–149)
VLDL Cholesterol Cal: 119 mg/dL — ABNORMAL HIGH (ref 5–40)

## 2018-11-10 LAB — HEMOGLOBIN A1C
Est. average glucose Bld gHb Est-mCnc: 134 mg/dL
Hgb A1c MFr Bld: 6.3 % — ABNORMAL HIGH (ref 4.8–5.6)

## 2018-11-10 LAB — T4, FREE: Free T4: 0.96 ng/dL (ref 0.82–1.77)

## 2018-11-10 LAB — TSH: TSH: 0.901 u[IU]/mL (ref 0.450–4.500)

## 2018-11-11 ENCOUNTER — Other Ambulatory Visit: Payer: Self-pay | Admitting: Family Medicine

## 2018-11-11 DIAGNOSIS — E782 Mixed hyperlipidemia: Secondary | ICD-10-CM

## 2018-11-11 MED ORDER — VASCEPA 1 G PO CAPS: 2.0000 | ORAL_CAPSULE | Freq: Two times a day (BID) | ORAL | 2 refills | Status: DC

## 2018-11-11 MED ORDER — SIMVASTATIN 20 MG PO TABS: 20.0000 mg | ORAL_TABLET | Freq: Every day | ORAL | 1 refills | Status: DC

## 2018-11-12 ENCOUNTER — Telehealth: Payer: Self-pay | Admitting: Family Medicine

## 2018-11-12 NOTE — Telephone Encounter (Signed)
P.A. VASCEPA  °

## 2018-11-24 NOTE — Progress Notes (Signed)
   Subjective:    Patient ID: Lauren Briggs, female    DOB: [birth year], 63 y.o.   MRN: [id number]  HPI Chief Complaint  Patient presents with  . follow-up    follow-up, bp is running lower than when she was here. running around 150-160/80-90   Here for a 2 week follow up on uncontrolled HTN and abnormal labs. This is her second visit with me.   HTN- started her on lisinopril/HCTZ 10-12.5 mg 2 weeks ago. Reports doing well on this medication.  Checks her BP at home and readings have been 150s-160s/ 80-90  HL with LDL 164 and hypertriglyceridemia Trigs 597- started on statin and Vascepa 2 weeks ago. States she is taking medications daily as well as Co-Q10. No side effects.   Prediabetes- Hgb A1c 6.3%- States she has cut back on sweets in general. Is watching salt intake also.  She has lost 2 lbs since our last visit and is encouraged. She heard from [facility]  No new concerns today.   Denies fever, chills, dizziness, chest pain, palpitations, shortness of breath, abdominal pain, N/V/D, urinary symptoms, LE edema.    Reviewed allergies, medications, past medical, surgical, family, and social history.     Review of Systems Pertinent positives and negatives in the history of present illness.     Objective:   Physical Exam BP (!) 142/90   Pulse 80   Temp 97.8 F (36.6 C)   Wt 242 lb 12.8 oz (110.1 kg)   BMI 42.34 kg/m   Alert and oriented and in no acute distress.  Not otherwise examined.      Assessment & Plan:  Essential hypertension -Blood pressure is responding well to the medication after only 2 weeks and she is not having any side effects.  She has made healthy dietary changes including cutting back on sodium.  She will continue lisinopril/HCTZ 10-12.5 mg daily and continue to keep an eye on her blood pressure.  Mixed hypercholesterolemia and hypertriglyceridemia -Started on statin therapy and Vascepa 2 weeks ago.  She is also taking over-the-counter  co-Q10.  She has had trouble with Lipitor in the past but reports doing fine currently on simvastatin and CoQ10.  Encouraged her to continue with treatment and continue watching her diet. -She will return in 4 weeks fasting we will recheck lipids and ALT at that time.  Prediabetes-previous hemoglobin A1c 6.3%.  She is made healthy dietary changes including cutting back on sweets and sugar.  Continue with diet modifications and increase physical activity as tolerated.

## 2018-11-25 ENCOUNTER — Encounter: Payer: Self-pay | Admitting: Family Medicine

## 2018-11-25 ENCOUNTER — Ambulatory Visit: Payer: BC Managed Care – PPO | Admitting: Family Medicine

## 2018-11-25 ENCOUNTER — Other Ambulatory Visit: Payer: Self-pay

## 2018-11-25 VITALS — BP 142/90 | HR 80 | Temp 97.8°F | Wt 242.8 lb

## 2018-11-25 DIAGNOSIS — I10 Essential (primary) hypertension: Secondary | ICD-10-CM

## 2018-11-25 DIAGNOSIS — R7303 Prediabetes: Secondary | ICD-10-CM | POA: Diagnosis not present

## 2018-11-25 DIAGNOSIS — E782 Mixed hyperlipidemia: Secondary | ICD-10-CM | POA: Diagnosis not present

## 2018-11-25 NOTE — Patient Instructions (Signed)
  Your BP is improving. Continue on the medication and watching your salt intake.   Continue eating a low sugar and carbohydrate diet.   Return for office visit and fasting labs in 4 weeks.

## 2018-11-26 NOTE — Telephone Encounter (Signed)
PA approved.

## 2018-11-30 ENCOUNTER — Telehealth: Payer: Self-pay | Admitting: Family Medicine

## 2018-11-30 NOTE — Telephone Encounter (Signed)
Called pt and confirmed she did get the Vascepa and ins did pay for it $0 and going well.

## 2018-12-02 ENCOUNTER — Ambulatory Visit (INDEPENDENT_AMBULATORY_CARE_PROVIDER_SITE_OTHER): Payer: BC Managed Care – PPO | Admitting: Cardiovascular Disease

## 2018-12-02 ENCOUNTER — Encounter: Payer: Self-pay | Admitting: Cardiovascular Disease

## 2018-12-02 ENCOUNTER — Other Ambulatory Visit: Payer: Self-pay

## 2018-12-02 DIAGNOSIS — E782 Mixed hyperlipidemia: Secondary | ICD-10-CM | POA: Diagnosis not present

## 2018-12-02 DIAGNOSIS — Z8249 Family history of ischemic heart disease and other diseases of the circulatory system: Secondary | ICD-10-CM | POA: Insufficient documentation

## 2018-12-02 NOTE — Assessment & Plan Note (Signed)
History of dyslipidemia on simvastatin and Vascepa with lipid profile performed 11/09/2018 revealing total cholesterol 382, triglyceride level of 597 and HDL 49.  I am going refer her to Dr. Lysbeth Penner lipid clinic for further evaluation

## 2018-12-02 NOTE — Patient Instructions (Addendum)
Medication Instructions:  Your physician recommends that you continue on your current medications as directed. Please refer to the Current Medication list given to you today.  If you need a refill on your cardiac medications before your next appointment, please call your pharmacy.   Lab work: NONE  Testing/Procedures: Coronary Calcium Score  Follow-Up: At [facility], you and your health needs are our priority.  As part of our continuing mission to provide you with exceptional heart care, we have created designated Provider Care Teams.  These Care Teams include your primary Cardiologist (physician) and Advanced Practice Providers (APPs -  Physician Assistants and Nurse Practitioners) who all work together to provide you with the care you need, when you need it. You may see Dr Gwenlyn Found or one of the following Advanced Practice Providers on your designated Care Team:    Kerin Ransom, PA-C  [place], [place]  Coletta Memos, [place]    Your physician wants you to follow-up in: 3 months with Dr Gwenlyn Found.   **CHECK BP DAILY FOR ONE MONTH**  Any Other Special Instructions Will Be Listed Below (If Applicable). A follow-up appointment with Cyril Mourning in the Pharmacy in 4 weeks for BP Control. You have been referred to Dr Debara Pickett in the [facility].    Coronary Calcium Scan A coronary calcium scan is an imaging test used to look for deposits of calcium and other fatty materials (plaques) in the inner lining of the blood vessels of the heart (coronary arteries). These deposits of calcium and plaques can partly clog and narrow the coronary arteries without producing any symptoms or warning signs. This puts a person at risk for a heart attack. This test can detect these deposits before symptoms develop. Tell a health care provider about:  Any allergies you have.  All medicines you are taking, including vitamins, herbs, eye drops, creams, and over-the-counter medicines.  Any problems you or family  members have had with anesthetic medicines.  Any blood disorders you have.  Any surgeries you have had.  Any medical conditions you have.  Whether you are pregnant or may be pregnant. What are the risks? Generally, this is a safe procedure. However, problems may occur, including:  Harm to a pregnant woman and her unborn baby. This test involves the use of radiation. Radiation exposure can be dangerous to a pregnant woman and her unborn baby. If you are pregnant, you generally should not have this procedure done.  Slight increase in the risk of cancer. This is because of the radiation involved in the test. What happens before the procedure? No preparation is needed for this procedure. What happens during the procedure?   You will undress and remove any jewelry around your neck or chest.  You will put on a hospital gown.  Sticky electrodes will be placed on your chest. The electrodes will be connected to an electrocardiogram (ECG) machine to record a tracing of the electrical activity of your heart.  A CT scanner will take pictures of your heart. During this time, you will be asked to lie still and hold your breath for 2-3 seconds while a picture of your heart is being taken. The procedure may vary among health care providers and hospitals. What happens after the procedure?  You can get dressed.  You can return to your normal activities.  It is up to you to get the results of your test. Ask your health care provider, or the department that is doing the test, when your results will be  ready. Summary  A coronary calcium scan is an imaging test used to look for deposits of calcium and other fatty materials (plaques) in the inner lining of the blood vessels of the heart (coronary arteries).  Generally, this is a safe procedure. Tell your health care provider if you are pregnant or may be pregnant.  No preparation is needed for this procedure.  A CT scanner will take pictures of  your heart.  You can return to your normal activities after the scan is done. This information is not intended to replace advice given to you by your health care provider. Make sure you discuss any questions you have with your health care provider. Document Released: 07/06/2007 Document Revised: 12/20/2016 Document Reviewed: 11/27/2015 Elsevier Patient Education  2020 [employer].

## 2018-12-02 NOTE — Assessment & Plan Note (Signed)
History of essential hypertension with blood pressure measured today at 149/80.  She is on lisinopril/hydrochlorothiazide.  I am going to have urged to keep a 30-day blood pressure log and will have her see Cyril Mourning back in the office in 4 weeks to further evaluate

## 2018-12-02 NOTE — Assessment & Plan Note (Signed)
Her father died of a myocardial infarction

## 2018-12-02 NOTE — Progress Notes (Signed)
12/02/2018 [name]   1955/11/08  [id number]  Primary Physician Girtha Rm, NP-C Primary Cardiologist: Lorretta Harp MD [name], [place]  HPI:  Lauren Briggs is a 63 y.o. morbidly overweight married Caucasian female mother of 2 children, grandmother of 3 grandchildren referred by her PCP, Mack Hook, NP and for risk factor modification.  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  Risk factors include treated hypertension, hyperlipidemia and family history with a father who died of a myocardial infarction.  She has never had a heart attack or stroke.  She denies chest pain or shortness of breath.  Her major medical issues have been spinal stenosis treated surgically by Dr. Saintclair Halsted in the past on 5 occasions.  She is actively trying to lose weight and is seeing a nutritionist.   Current Meds  Medication Sig  . Acetaminophen (TYLENOL PO) Take by mouth.  . Coenzyme Q10 (COQ-10) 100 MG CAPS otc  . DULoxetine (CYMBALTA) 60 MG capsule Take 60 mg by mouth daily.   Marland Kitchen estradiol (ESTRACE) 1 MG tablet Take 1 mg by mouth every morning.   Vanessa Kick Ethyl (VASCEPA) 1 g CAPS Take 2 capsules (2 g total) by mouth 2 (two) times daily.  Marland Kitchen lisinopril-hydrochlorothiazide (ZESTORETIC) 10-12.5 MG tablet Take 1 tablet by mouth daily.  . Multiple Vitamin (MULTIVITAMIN WITH MINERALS) TABS Take 1 tablet by mouth daily after supper. Women's 50 plus  . pregabalin (LYRICA) 50 MG capsule Take 1 mg by mouth every other day.  . simvastatin (ZOCOR) 20 MG tablet Take 1 tablet (20 mg total) by mouth at bedtime.     Allergies  Allergen Reactions  . Propoxyphene Hcl Palpitations    darvon     Social History   Socioeconomic History  . Marital status: Married    Spouse name: Not on file  . Number of children: Not on file  . Years of education: Not on file  . Highest education level:  Not on file  Occupational History  . Not on file  Social Needs  . Financial resource strain: Not on file  . Food insecurity    Worry: Not on file    Inability: Not on file  . Transportation needs    Medical: Not on file    Non-medical: Not on file  Tobacco Use  . Smoking status: Former Smoker    Years: 3.00    Types: Cigarettes    Quit date: 1981    Years since quitting: 39.8  . Smokeless tobacco: Former Systems developer    Quit date: 01/22/1979  Substance and Sexual Activity  . Alcohol use: Yes    Comment: occasional bourbon and ginger ale   . Drug use: No  . Sexual activity: Not on file  Lifestyle  . Physical activity    Days per week: Not on file    Minutes per session: Not on file  . Stress: Not on file  Relationships  . Social Herbalist on phone: Not on file    Gets together: Not on file    Attends religious service: Not on file    Active member of club or organization: Not on file    Attends meetings of clubs or organizations: Not on file    Relationship status: Not on file  . Intimate partner violence    Fear  of current or ex partner: Not on file    Emotionally abused: Not on file    Physically abused: Not on file    Forced sexual activity: Not on file  Other Topics Concern  . Not on file  Social History Narrative  . Not on file     Review of Systems: General: negative for chills, fever, night sweats or weight changes.  Cardiovascular: negative for chest pain, dyspnea on exertion, edema, orthopnea, palpitations, paroxysmal nocturnal dyspnea or shortness of breath Dermatological: negative for rash Respiratory: negative for cough or wheezing Urologic: negative for hematuria Abdominal: negative for nausea, vomiting, diarrhea, bright red blood per rectum, melena, or hematemesis Neurologic: negative for visual changes, syncope, or dizziness All other systems reviewed and are otherwise negative except as noted above.    Blood pressure (!) 149/80, pulse 91,  height 5' 4.5" (1.638 m), weight 244 lb (110.7 kg), SpO2 98 %.  General appearance: alert and no distress Neck: no adenopathy, no carotid bruit, no JVD, supple, symmetrical, trachea midline and thyroid not enlarged, symmetric, no tenderness/mass/nodules Lungs: clear to auscultation bilaterally Heart: regular rate and rhythm, S1, S2 normal, no murmur, click, rub or gallop Extremities: extremities normal, atraumatic, no cyanosis or edema Pulses: 2+ and symmetric Skin: Skin color, texture, turgor normal. No rashes or lesions Neurologic: Alert and oriented X 3, normal strength and tone. Normal symmetric reflexes. Normal coordination and gait  EKG not performed today  ASSESSMENT AND PLAN:   Family history of heart disease Her father died of a myocardial infarction  Mixed hypercholesterolemia and hypertriglyceridemia History of dyslipidemia on simvastatin and Vascepa with lipid profile performed 11/09/2018 revealing total cholesterol 382, triglyceride level of 597 and HDL 49.  I am going refer her to Dr. Lysbeth Penner lipid clinic for further evaluation  Essential hypertension History of essential hypertension with blood pressure measured today at 149/80.  She is on lisinopril/hydrochlorothiazide.  I am going to have urged to keep a 30-day blood pressure log and will have her see Cyril Mourning back in the office in 4 weeks to further evaluate      Lorretta Harp MD [facility], [facility] 12/02/2018 9:52 AM

## 2018-12-08 ENCOUNTER — Ambulatory Visit: Payer: BC Managed Care – PPO | Admitting: Family Medicine

## 2018-12-21 ENCOUNTER — Other Ambulatory Visit: Payer: Self-pay

## 2018-12-21 ENCOUNTER — Ambulatory Visit (INDEPENDENT_AMBULATORY_CARE_PROVIDER_SITE_OTHER)
Admission: RE | Admit: 2018-12-21 | Discharge: 2018-12-21 | Disposition: A | Discharge status: Home / Self Care | Payer: Self-pay | Source: Ambulatory Visit | Attending: Cardiovascular Disease | Admitting: Cardiovascular Disease

## 2018-12-21 DIAGNOSIS — Z8249 Family history of ischemic heart disease and other diseases of the circulatory system: Secondary | ICD-10-CM

## 2018-12-21 DIAGNOSIS — E782 Mixed hyperlipidemia: Secondary | ICD-10-CM

## 2018-12-23 ENCOUNTER — Other Ambulatory Visit: Payer: Self-pay

## 2018-12-23 ENCOUNTER — Ambulatory Visit: Payer: BC Managed Care – PPO | Admitting: Family Medicine

## 2018-12-23 ENCOUNTER — Encounter: Payer: Self-pay | Admitting: Family Medicine

## 2018-12-23 VITALS — BP 146/82 | HR 75 | Temp 97.6°F | Wt 240.0 lb

## 2018-12-23 DIAGNOSIS — E782 Mixed hyperlipidemia: Secondary | ICD-10-CM | POA: Diagnosis not present

## 2018-12-23 DIAGNOSIS — I10 Essential (primary) hypertension: Secondary | ICD-10-CM | POA: Diagnosis not present

## 2018-12-23 LAB — LIPID PANEL
Chol/HDL Ratio: 4.3 ratio (ref 0.0–4.4)
Cholesterol, Total: 223 mg/dL — ABNORMAL HIGH (ref 100–199)
HDL: 52 mg/dL (ref >39)
LDL Chol Calc (NIH): 113 mg/dL — ABNORMAL HIGH (ref 0–99)
Triglycerides: 335 mg/dL — ABNORMAL HIGH (ref 0–149)
VLDL Cholesterol Cal: 58 mg/dL — ABNORMAL HIGH (ref 5–40)

## 2018-12-23 LAB — COMPREHENSIVE METABOLIC PANEL
ALT: 30 IU/L (ref 0–32)
AST: 27 IU/L (ref 0–40)
Albumin/Globulin Ratio: 2 (ref 1.2–2.2)
Albumin: 4.3 g/dL (ref 3.8–4.8)
Alkaline Phosphatase: 57 IU/L (ref 39–117)
BUN/Creatinine Ratio: 29 — ABNORMAL HIGH (ref 12–28)
BUN: 19 mg/dL (ref 8–27)
Bilirubin Total: 0.3 mg/dL (ref 0.0–1.2)
CO2: 25 mmol/L (ref 20–29)
Calcium: 9.4 mg/dL (ref 8.7–10.3)
Chloride: 99 mmol/L (ref 96–106)
Creatinine, Ser: 0.66 mg/dL (ref 0.57–1.00)
GFR calc Af Amer: 109 mL/min/{1.73_m2} (ref >59)
GFR calc non Af Amer: 94 mL/min/{1.73_m2} (ref >59)
Globulin, Total: 2.2 g/dL (ref 1.5–4.5)
Glucose: 104 mg/dL — ABNORMAL HIGH (ref 65–99)
Potassium: 4.8 mmol/L (ref 3.5–5.2)
Sodium: 138 mmol/L (ref 134–144)
Total Protein: 6.5 g/dL (ref 6.0–8.5)

## 2018-12-23 NOTE — Progress Notes (Signed)
   Subjective:    Patient ID: Lauren Briggs, female    DOB: 1956/01/21, 63 y.o.   MRN: [id number]  HPI Chief Complaint  Patient presents with  . 4 week follow-up    4 week follow-up fasting f/u. bp still reading high but not as bad as it was   Here for a follow up.  She would like to have her cholesterol checked today. States she has been limiting sweets. No longer having doughnuts, cappuccinos.  Eating more vegetables.  States she has lost 4 lbs.  Taking simvastatin and Vascepa. She reported having side effects with Lipitor in the past.   BP at home has been slightly elevated but improved overall.  Reports taking her medication daily without any side effects.    She has an appt for HTN and lipids with cardiology next week.  No new concerns. Reports feeling good.   Denies fever, chills, dizziness, chest pain, palpitations, shortness of breath, abdominal pain, N/V/D, LE edema.   Reviewed allergies, medications, past medical, surgical, family, and social history.   Review of Systems Pertinent positives and negatives in the history of present illness.     Objective:   Physical Exam BP (!) 146/82   Pulse 75   Temp 97.6 F (36.4 C) (Tympanic)   Wt 240 lb (108.9 kg)   BMI 40.56 kg/m   Alert and in no distress.  Cardiac exam shows a regular rhythm without murmurs or gallops. Lungs are clear to auscultation. LE without edema.       Assessment & Plan:  Mixed hypercholesterolemia and hypertriglyceridemia - Plan: Lipid Panel, Comprehensive metabolic panel  Essential hypertension - Plan: Comprehensive metabolic panel  Encouraged healthy diet and increasing physical activity as tolerated. Continue current medications. Low sodium diet.  She is fasting and requests repeat of lipids. Follow up with cardiology next week as scheduled.

## 2018-12-23 NOTE — Patient Instructions (Signed)
Keep up the good work and look for ways to increase your physical activity.   Follow up with cardiology as scheduled.

## 2018-12-31 DIAGNOSIS — G629 Polyneuropathy, unspecified: Secondary | ICD-10-CM | POA: Insufficient documentation

## 2018-12-31 DIAGNOSIS — M47816 Spondylosis without myelopathy or radiculopathy, lumbar region: Secondary | ICD-10-CM | POA: Diagnosis not present

## 2019-01-05 ENCOUNTER — Other Ambulatory Visit: Payer: Self-pay

## 2019-01-05 ENCOUNTER — Ambulatory Visit (INDEPENDENT_AMBULATORY_CARE_PROVIDER_SITE_OTHER): Payer: BC Managed Care – PPO | Admitting: Pharmacist

## 2019-01-05 VITALS — BP 148/90 | HR 76 | Ht 64.0 in | Wt 240.6 lb

## 2019-01-05 DIAGNOSIS — I10 Essential (primary) hypertension: Secondary | ICD-10-CM | POA: Diagnosis not present

## 2019-01-05 MED ORDER — LISINOPRIL-HYDROCHLOROTHIAZIDE 20-12.5 MG PO TABS: 1.0000 | ORAL_TABLET | Freq: Every day | ORAL | 1 refills | Status: DC

## 2019-01-05 NOTE — Patient Instructions (Addendum)
Return for a  follow up appointment in 2 months (Dr Gwenlyn Found on 03/08/2018)  Go to the lab in 2 weeks  Check your blood pressure at home daily (if able) and keep record of the readings.  Take your BP meds as follows: *INCREASE lisinopril/HCTZ 20mg -12.5mg  daily*  Bring all of your meds, your BP cuff and your record of home blood pressures to your next appointment.  Exercise as you're able, try to walk approximately 30 minutes per day.  Keep salt intake to a minimum, especially watch canned and prepared boxed foods.  Eat more fresh fruits and vegetables and fewer canned items.  Avoid eating in fast food restaurants.    HOW TO TAKE YOUR BLOOD PRESSURE: . Rest 5 minutes before taking your blood pressure. .  Don't smoke or drink caffeinated beverages for at least 30 minutes before. . Take your blood pressure before (not after) you eat. . Sit comfortably with your back supported and both feet on the floor (don't cross your legs). . Elevate your arm to heart level on a table or a desk. . Use the proper sized cuff. It should fit smoothly and snugly around your bare upper arm. There should be enough room to slip a fingertip under the cuff. The bottom edge of the cuff should be 1 inch above the crease of the elbow. . Ideally, take 3 measurements at one sitting and record the average.

## 2019-01-05 NOTE — Progress Notes (Signed)
Patient ID: Lauren Briggs                 DOB: [birth year]                      MRN: [id number]     HPI: Lauren Briggs is a 63 y.o. female referred by Dr. Gwenlyn Found to HTN clinic. PMH includes hypertension, and hyperlipidemia. Patient is actively trying to lose weight and seeing a nutritionist. Patient started on lisinopril/HCTZ 10-12.5mg  in October/2020 per PCP. Back surgery in July/2020 with residual nerve pain on her foot. Currently working on her diet but continues to eat out most day of the week and on the waiting list for [facility]. Denies dizziness, swelling, headaches, chest pain or blurry vision.   Current HTN meds:  Lisinopril-HCTZ 10-12.5mg  daily   Intolerance: none  BP goal: <130/80  Family History: reports hx of MI in father 67s  Social History: former smoker, occasional alcohol intake  Diet: working on decreasing sodium an fat. Still eating most meal as takeout and don't like to cook at home.  Exercise: limited d/t nerve pain on her foot  Home BP readings: no records; 150s to 170s per patient hx  Wt Readings from Last 3 Encounters:  01/05/19 240 lb 9.6 oz (109.1 kg)  12/23/18 240 lb (108.9 kg)  12/02/18 244 lb (110.7 kg)   BP Readings from Last 3 Encounters:  01/05/19 (!) 148/90  12/23/18 (!) 146/82  12/02/18 (!) 149/80   Pulse Readings from Last 3 Encounters:  01/05/19 76  12/23/18 75  12/02/18 91    Past Medical History:  Diagnosis Date  . Allergy   . Arthritis   . Complication of anesthesia    HX Back CHIPPED TOOTH S/P EXTUBATION   . Hyperlipidemia   . Hypertension   . Low back pain radiating to right leg   . 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 Neuromuscular disorder ([place])   . Prediabetes 11/10/2018  . Shortness of breath dyspnea     Current Outpatient Medications on File Prior to Visit  Medication Sig Dispense Refill  . Acetaminophen (TYLENOL PO) Take by mouth.    . Coenzyme Q10  (COQ-10) 100 MG CAPS otc 30 capsule 0  . DULoxetine (CYMBALTA) 60 MG capsule Take 60 mg by mouth daily.   1  . estradiol (ESTRACE) 1 MG tablet Take 1 mg by mouth every morning.     Vanessa Kick Ethyl (VASCEPA) 1 g CAPS Take 2 capsules (2 g total) by mouth 2 (two) times daily. 120 capsule 2  . Multiple Vitamin (MULTIVITAMIN WITH MINERALS) TABS Take 1 tablet by mouth daily after supper. Women's 50 plus    . pregabalin (LYRICA) 50 MG capsule Take 1 mg by mouth every other day.    . simvastatin (ZOCOR) 20 MG tablet Take 1 tablet (20 mg total) by mouth at bedtime. 90 tablet 1   No current facility-administered medications on file prior to visit.    Allergies  Allergen Reactions  . Propoxyphene Hcl Palpitations    darvon     Blood pressure (!) 148/90, pulse 76, height 5\' 4"  (1.626 m), weight 240 lb 9.6 oz (109.1 kg), SpO2 96 %.  Essential hypertension BP remains above goal. Her 1st appointment with [facility] was scheduled for January/2021.   Will increase lisinopril/HCTZ dose to 20mg /12.5mg  daily and repeat BMET in 2 weeks. Follow up with DR Gwenlyn Found already scheduled  fro 2 month and lifestyle modifications to sat in January with the weight management clinic. Plan to follow up as needed and start amlodipine 5mg  daily if additional BP control needed.     Maja Mccaffery Rodriguez-Guzman PharmD, BCPS, [place] [id number] 01/07/2019 4:47 PM

## 2019-01-07 ENCOUNTER — Encounter: Payer: Self-pay | Admitting: Pharmacist

## 2019-01-07 NOTE — Assessment & Plan Note (Signed)
BP remains above goal. Her 1st appointment with [facility] was scheduled for January/2021.   Will increase lisinopril/HCTZ dose to 20mg /12.5mg  daily and repeat BMET in 2 weeks. Follow up with DR Gwenlyn Found already scheduled fro 2 month and lifestyle modifications to sat in January with the weight management clinic. Plan to follow up as needed and start amlodipine 5mg  daily if additional BP control needed.

## 2019-01-21 DIAGNOSIS — R200 Anesthesia of skin: Secondary | ICD-10-CM | POA: Insufficient documentation

## 2019-01-28 DIAGNOSIS — R200 Anesthesia of skin: Secondary | ICD-10-CM | POA: Diagnosis not present

## 2019-01-28 DIAGNOSIS — M5137 Other intervertebral disc degeneration, lumbosacral region: Secondary | ICD-10-CM | POA: Diagnosis not present

## 2019-02-01 ENCOUNTER — Telehealth: Payer: Self-pay | Admitting: Nurse Practitioner

## 2019-02-01 ENCOUNTER — Other Ambulatory Visit: Payer: Self-pay | Admitting: Neurosurgery

## 2019-02-01 DIAGNOSIS — M5137 Other intervertebral disc degeneration, lumbosacral region: Secondary | ICD-10-CM

## 2019-02-01 NOTE — Telephone Encounter (Signed)
Phone call to patient to verify medication list and allergies for myelogram procedure. Pt instructed to hold Cymbalta for 48hrs prior to myelogram appointment time. Pt verbalized understanding. Pre and post procedure instructions reviewed with pt. 

## 2019-02-10 ENCOUNTER — Other Ambulatory Visit: Payer: Self-pay

## 2019-02-10 ENCOUNTER — Ambulatory Visit
Admission: RE | Admit: 2019-02-10 | Discharge: 2019-02-10 | Disposition: A | Discharge status: Home / Self Care | Payer: BC Managed Care – PPO | Source: Ambulatory Visit | Attending: Neurosurgery | Admitting: Neurosurgery

## 2019-02-10 DIAGNOSIS — M549 Dorsalgia, unspecified: Secondary | ICD-10-CM | POA: Diagnosis not present

## 2019-02-10 DIAGNOSIS — M5137 Other intervertebral disc degeneration, lumbosacral region: Secondary | ICD-10-CM

## 2019-02-10 DIAGNOSIS — M4126 Other idiopathic scoliosis, lumbar region: Secondary | ICD-10-CM | POA: Diagnosis not present

## 2019-02-10 DIAGNOSIS — M5126 Other intervertebral disc displacement, lumbar region: Secondary | ICD-10-CM | POA: Diagnosis not present

## 2019-02-10 MED ORDER — DIAZEPAM 5 MG PO TABS
5.0000 mg | ORAL_TABLET | Freq: Once | ORAL | Status: AC
  Administered 2019-02-10: 10:00:00 5 mg via ORAL

## 2019-02-10 MED ORDER — ONDANSETRON HCL 4 MG/2ML IJ SOLN: 4.0000 mg | Freq: Four times a day (QID) | INTRAMUSCULAR | Status: DC | PRN

## 2019-02-10 MED ORDER — IOPAMIDOL (ISOVUE-M 200) INJECTION 41%
18.0000 mL | Freq: Once | INTRAMUSCULAR | Status: AC
  Administered 2019-02-10: 18 mL via INTRATHECAL

## 2019-02-10 NOTE — Progress Notes (Signed)
Patient states she has been off Cymbalta for at least the past two days. 

## 2019-02-10 NOTE — Discharge Instructions (Signed)
Myelogram Discharge Instructions  1. Go home and rest quietly for the next 24 hours.  It is important to lie flat for the next 24 hours.  Get up only to go to the restroom.  You may lie in the bed or on a couch on your back, your stomach, your left side or your right side.  You may have one pillow under your head.  You may have pillows between your knees while you are on your side or under your knees while you are on your back.  2. DO NOT drive today.  Recline the seat as far back as it will go, while still wearing your seat belt, on the way home.  3. You may get up to go to the bathroom as needed.  You may sit up for 10 minutes to eat.  You may resume your normal diet and medications unless otherwise indicated.  Drink plenty of extra fluids today and tomorrow.  4. The incidence of a spinal headache with nausea and/or vomiting is about 5% (one in 20 patients).  If you develop a headache, lie flat and drink plenty of fluids until the headache goes away.  Caffeinated beverages may be helpful.  If you develop severe nausea and vomiting or a headache that does not go away with flat bed rest, call [phone].  5. You may resume normal activities after your 24 hours of bed rest is over; however, do not exert yourself strongly or do any heavy lifting tomorrow.  6. Call your physician for a follow-up appointment.    You may resume Cymbalta on Thursday, February 11, 2019 after 9:30a.m.

## 2019-02-16 DIAGNOSIS — Z6841 Body Mass Index (BMI) 40.0 and over, adult: Secondary | ICD-10-CM | POA: Diagnosis not present

## 2019-02-16 DIAGNOSIS — M79671 Pain in right foot: Secondary | ICD-10-CM | POA: Diagnosis not present

## 2019-02-16 DIAGNOSIS — M79673 Pain in unspecified foot: Secondary | ICD-10-CM | POA: Insufficient documentation

## 2019-02-16 DIAGNOSIS — I10 Essential (primary) hypertension: Secondary | ICD-10-CM | POA: Diagnosis not present

## 2019-02-17 DIAGNOSIS — H5203 Hypermetropia, bilateral: Secondary | ICD-10-CM | POA: Diagnosis not present

## 2019-02-17 DIAGNOSIS — H2513 Age-related nuclear cataract, bilateral: Secondary | ICD-10-CM | POA: Diagnosis not present

## 2019-02-25 ENCOUNTER — Encounter: Payer: Self-pay | Admitting: Podiatry

## 2019-02-25 ENCOUNTER — Ambulatory Visit (INDEPENDENT_AMBULATORY_CARE_PROVIDER_SITE_OTHER): Payer: BC Managed Care – PPO

## 2019-02-25 ENCOUNTER — Other Ambulatory Visit: Payer: Self-pay | Admitting: Podiatry

## 2019-02-25 ENCOUNTER — Other Ambulatory Visit: Payer: Self-pay

## 2019-02-25 ENCOUNTER — Ambulatory Visit (INDEPENDENT_AMBULATORY_CARE_PROVIDER_SITE_OTHER): Payer: BC Managed Care – PPO | Admitting: Podiatry

## 2019-02-25 VITALS — BP 155/80 | HR 85 | Temp 96.6°F

## 2019-02-25 DIAGNOSIS — M778 Other enthesopathies, not elsewhere classified: Secondary | ICD-10-CM | POA: Diagnosis not present

## 2019-02-25 DIAGNOSIS — M79671 Pain in right foot: Secondary | ICD-10-CM | POA: Diagnosis not present

## 2019-02-25 DIAGNOSIS — M79672 Pain in left foot: Secondary | ICD-10-CM

## 2019-02-25 NOTE — Progress Notes (Signed)
Subjective:   Patient ID: Lauren Briggs, female   DOB: 64 y.o.   MRN: [id number]   HPI Patient presents stating she has had problems with her left foot since last year and she had surgery on her back with thought that it was nerve pain and states that it is in the left foot and she has had a history of 5 back surgeries.  Patient would like to be more active does have moderate obesity and is trying to lose weight and does not smoke   Review of Systems  All other systems reviewed and are negative.       Objective:  Physical Exam Vitals and nursing note reviewed.  Constitutional:      Appearance: She is well-developed.  Pulmonary:     Effort: Pulmonary effort is normal.  Musculoskeletal:        General: Normal range of motion.  Skin:    General: Skin is warm.  Neurological:     Mental Status: She is alert.     Neurovascular status intact muscle strength found to be adequate range of motion was within normal limits.  Patient is found on the left foot dorsal lateral side to have quite a bit of inflammation and pain with a previous surgery for removal of a cyst of the left first interspace.  It is quite sore in this area and the right foot is mildly tender not to the same degree.  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  Today I did sterile prep I injected the dorsal lateral aspect left foot and I also went ahead and I applied fascial brace to lift up the arch.  I discussed based on these results we may need orthotics long-term and I also discussed the possibility for CT scan or MRI if symptoms persist.  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

## 2019-03-02 ENCOUNTER — Other Ambulatory Visit: Payer: Self-pay

## 2019-03-02 ENCOUNTER — Ambulatory Visit (INDEPENDENT_AMBULATORY_CARE_PROVIDER_SITE_OTHER): Payer: BC Managed Care – PPO | Admitting: Family Medicine

## 2019-03-02 ENCOUNTER — Encounter (INDEPENDENT_AMBULATORY_CARE_PROVIDER_SITE_OTHER): Payer: Self-pay | Admitting: Family Medicine

## 2019-03-02 VITALS — BP 146/80 | HR 83 | Temp 98.1°F | Ht 63.0 in | Wt 234.0 lb

## 2019-03-02 DIAGNOSIS — R5383 Other fatigue: Secondary | ICD-10-CM

## 2019-03-02 DIAGNOSIS — R0602 Shortness of breath: Secondary | ICD-10-CM | POA: Diagnosis not present

## 2019-03-02 DIAGNOSIS — F3289 Other specified depressive episodes: Secondary | ICD-10-CM

## 2019-03-02 DIAGNOSIS — Z6841 Body Mass Index (BMI) 40.0 and over, adult: Secondary | ICD-10-CM

## 2019-03-02 DIAGNOSIS — Z780 Asymptomatic menopausal state: Secondary | ICD-10-CM

## 2019-03-02 DIAGNOSIS — E782 Mixed hyperlipidemia: Secondary | ICD-10-CM | POA: Diagnosis not present

## 2019-03-02 DIAGNOSIS — G4719 Other hypersomnia: Secondary | ICD-10-CM

## 2019-03-02 DIAGNOSIS — M549 Dorsalgia, unspecified: Secondary | ICD-10-CM

## 2019-03-02 DIAGNOSIS — G8929 Other chronic pain: Secondary | ICD-10-CM

## 2019-03-02 DIAGNOSIS — I10 Essential (primary) hypertension: Secondary | ICD-10-CM

## 2019-03-02 DIAGNOSIS — Z9189 Other specified personal risk factors, not elsewhere classified: Secondary | ICD-10-CM | POA: Diagnosis not present

## 2019-03-02 DIAGNOSIS — Z0289 Encounter for other administrative examinations: Secondary | ICD-10-CM

## 2019-03-02 DIAGNOSIS — R7303 Prediabetes: Secondary | ICD-10-CM | POA: Diagnosis not present

## 2019-03-02 NOTE — Progress Notes (Signed)
Dear Harland Dingwall, NP-C,   Thank you for referring Lauren Briggs to our clinic. The following note includes my evaluation and treatment recommendations.  Chief Complaint:   OBESITY Lauren Briggs (MR# [id number]) is a 64 y.o. female who presents for evaluation and treatment of obesity and related comorbidities. Current BMI is Body mass index is 41.45 kg/m. [name] has been struggling with her weight for many years and has been unsuccessful in either losing weight, maintaining weight loss, or reaching her healthy weight goal.  [name] is currently in the action stage of change and ready to dedicate time achieving and maintaining a healthier weight. [name] is interested in becoming our patient and working on intensive lifestyle modifications including (but not limited to) diet and exercise for weight loss.  [name]'s habits were reviewed today and are as follows: Her family eats meals together, she thinks her family will eat healthier with her, her desired weight loss is 60 pounds, she has been heavy most of her life, she started gaining weight after she had back surgery and after menopause, she craves sweets, she sometimes makes poor food choices, she sometimes eats larger portions than normal and she struggles with emotional eating.  Depression Screen [name]'s Food and Mood (modified PHQ-9) score was 8.  Depression screen PHQ 2/9 03/02/2019  Decreased Interest 1  Down, Depressed, Hopeless 0  PHQ - 2 Score 1  Altered sleeping 0  Tired, decreased energy 1  Change in appetite 1  Feeling bad or failure about yourself  2  Trouble concentrating 2  Moving slowly or fidgety/restless 1  Suicidal thoughts 0  PHQ-9 Score 8  Difficult doing work/chores Not difficult at all   Subjective:   1. Other fatigue [name] admits to daytime somnolence and reports waking up still tired. Patent has a history of symptoms of daytime fatigue and morning fatigue. [name] generally gets 8 hours of sleep per night, and  states that she has generally restful sleep. She does not know if she snores. Apneic episodes are not present. Epworth Sleepiness Score is 8.  2. SOB (shortness of breath) on exertion [name] notes increasing shortness of breath with exercising and seems to be worsening over time with weight gain. She notes getting out of breath sooner with activity than she used to. This has gotten worse recently. [name] denies shortness of breath at rest or orthopnea.  3. Essential hypertension Review: taking medications as instructed, no medication side effects noted, no chest pain on exertion, no dyspnea on exertion, no swelling of ankles.  Today's blood pressure was elevated at 146/80.  The patient reports this is low for her.  Previous blood pressures 180s/90s in October.  She takes lisinopril-HCTZ 20-12.5 for blood pressure control.  BP Readings from Last 3 Encounters:  03/02/19 (!) 146/80  02/25/19 (!) 155/80  02/10/19 (!) 181/73   4. Mixed hyperlipidemia [name] has hyperlipidemia and has been trying to improve her cholesterol levels with intensive lifestyle modification including a low saturated fat diet, exercise and weight loss. She denies any chest pain, claudication or myalgias.  She is taking simvastatin 20 mg daily.  Lab Results  Component Value Date   ALT 30 12/23/2018   AST 27 12/23/2018   ALKPHOS 57 12/23/2018   BILITOT 0.3 12/23/2018   Lab Results  Component Value Date   CHOL 223 (H) 12/23/2018   HDL 52 12/23/2018   LDLCALC 113 (H) 12/23/2018   TRIG 335 (H) 12/23/2018   CHOLHDL 4.3 12/23/2018  5. Prediabetes [name] has a diagnosis of prediabetes based on her elevated HgA1c and was informed this puts her at greater risk of developing diabetes. She continues to work on diet and exercise to decrease her risk of diabetes. She denies nausea or hypoglycemia.  Lab Results  Component Value Date   HGBA1C 6.3 (H) 11/09/2018   6. Postmenopausal, on HRT [name] is postmenopausal and is taking  hormone replacement therapy.  7. Chronic back pain, unspecified back location, unspecified back pain laterality [name] has chronic back pain and has had multiple back surgeries.  8. Excessive daytime sleepiness [name] has no history of a sleep study.  Situation Chance of Dozing or Sleeping  Sitting and reading 2 = moderate chance of dozing or sleeping  Watching television 1 = slight chance of dozing or sleeping  Sitting inactive in a public place (theater or meeting) 0 = would never doze or sleep  Lying down in the afternoon when circumstances permit 1 = slight chance of dozing or sleeping  Sitting and talking to someone 0 = would never doze or sleep  Sitting quietly after lunch without alcohol 1 = slight chance of dozing or sleeping  Sitting as a passenger in a car for an hour 2 = moderate chance of dozing or sleeping  In a car, while stopped for a few minutes in traffic 0 = would never doze or sleep  TOTAL 7   9. Other depression, with emotional eating [name] is struggling with emotional eating and using food for comfort to the extent that it is negatively impacting her health. She has been working on behavior modification techniques to help reduce her emotional eating and has been unsuccessful. She shows no sign of suicidal or homicidal ideations.  PHQ-9 is 8 today.  Assessment/Plan:   1. Other fatigue [name] does feel that her weight is causing her energy to be lower than it should be. Fatigue may be related to obesity, depression or many other causes. Labs will be ordered, and in the meanwhile, [name] will focus on self care including making healthy food choices, increasing physical activity and focusing on stress reduction.  Orders - EKG 12-Lead - VITAMIN D 25 Hydroxy (Vit-D Deficiency, Fractures) - TSH - T4, free - T3 - Anemia panel  2. SOB (shortness of breath) on exertion [name] does feel that she gets out of breath more easily that she used to when she exercises. [name]'s  shortness of breath appears to be obesity related and exercise induced. She has agreed to work on weight loss and gradually increase exercise to treat her exercise induced shortness of breath. Will continue to monitor closely.  Orders - CBC with Differential/Platelet  3. Essential hypertension Tirah is working on healthy weight loss and exercise to improve blood pressure control. We will watch for signs of hypotension as she continues her lifestyle modifications.  4. Mixed hyperlipidemia Cardiovascular risk and specific lipid/LDL goals reviewed.  We discussed several lifestyle modifications today and Jelaine will continue to work on diet, exercise and weight loss efforts. Orders and follow up as documented in patient record.   Counseling Intensive lifestyle modifications are the first line treatment for this issue. . Dietary changes: Increase soluble fiber. Decrease simple carbohydrates. . Exercise changes: Moderate to vigorous-intensity aerobic activity 150 minutes per week if tolerated. . Lipid-lowering medications: see documented in medical record.  5. Prediabetes Arminda will continue to work on weight loss, exercise, and decreasing simple carbohydrates to help decrease the risk of diabetes.   Orders -  Comprehensive metabolic panel - Hemoglobin A1c - Insulin, random  6. Postmenopausal, on HRT Will monitor.  7. Chronic back pain, unspecified back location, unspecified back pain laterality Will follow because mobility and pain control are important for weight management.  8. Excessive daytime sleepiness Will follow.  9. Other depression, with emotional eating Behavior modification techniques were discussed today to help Janelyn deal with her emotional/non-hunger eating behaviors.  Orders and follow up as documented in patient record.   10. At risk for diabetes mellitus Makenzee was given approximately 15 minutes of diabetes education and counseling today. We discussed intensive  lifestyle modifications today with an emphasis on weight loss as well as increasing exercise and decreasing simple carbohydrates in her diet. We also reviewed medication options with an emphasis on risk versus benefit of those discussed.   Repetitive spaced learning was employed today to elicit superior memory formation and behavioral change.  11. Class 3 severe obesity with serious comorbidity and body mass index (BMI) of 40.0 to 44.9 in adult, unspecified obesity type [facility]) Kaleeah is currently in the action stage of change and her goal is to continue with weight loss efforts. I recommend Skyela begin the structured treatment plan as follows:  She has agreed to the Category 3 Plan.  Exercise goals: All adults should avoid inactivity. Some physical activity is better than none, and adults who participate in any amount of physical activity gain some health benefits.   Behavioral modification strategies: increasing lean protein intake, decreasing simple carbohydrates, increasing vegetables and increasing water intake.  She was informed of the importance of frequent follow-up visits to maximize her success with intensive lifestyle modifications for her multiple health conditions. She was informed we would discuss her lab results at her next visit unless there is a critical issue that needs to be addressed sooner. Kyrsten agreed to keep her next visit at the agreed upon time to discuss these results.  Objective:   Blood pressure (!) 146/80, pulse 83, temperature 98.1 F (36.7 C), temperature source Oral, height 5\' 3"  (1.6 m), weight 234 lb (106.1 kg), SpO2 97 %. Body mass index is 41.45 kg/m.  EKG: Normal sinus rhythm, rate 84 bpm.  Indirect Calorimeter completed today shows a VO2 of 298 and a REE of 2074.  Her calculated basal metabolic rate is [id number] thus her basal metabolic rate is better than expected.  General: Cooperative, alert, well developed, in no acute distress. HEENT: Conjunctivae and  lids unremarkable. Cardiovascular: Regular rhythm.  Lungs: Normal work of breathing. Neurologic: No focal deficits.   Lab Results  Component Value Date   CREATININE 0.66 12/23/2018   BUN 19 12/23/2018   NA 138 12/23/2018   K 4.8 12/23/2018   CL 99 12/23/2018   CO2 25 12/23/2018   Lab Results  Component Value Date   ALT 30 12/23/2018   AST 27 12/23/2018   ALKPHOS 57 12/23/2018   BILITOT 0.3 12/23/2018   Lab Results  Component Value Date   HGBA1C 6.3 (H) 11/09/2018   Lab Results  Component Value Date   TSH 0.901 11/09/2018   Lab Results  Component Value Date   CHOL 223 (H) 12/23/2018   HDL 52 12/23/2018   LDLCALC 113 (H) 12/23/2018   TRIG 335 (H) 12/23/2018   CHOLHDL 4.3 12/23/2018   Lab Results  Component Value Date   WBC 7.9 08/10/2018   HGB 13.3 08/10/2018   HCT 42.1 08/10/2018   MCV 97.5 08/10/2018   PLT 331 08/10/2018   Attestation  Statements:   This is the patient's first visit at [facility]. The patient's NEW PATIENT PACKET was reviewed at length. Included in the packet: current and past health history, medications, allergies, ROS, gynecologic history (women only), surgical history, family history, social history, weight history, weight loss surgery history (for those that have had weight loss surgery), nutritional evaluation, mood and food questionnaire, PHQ9, Epworth questionnaire, sleep habits questionnaire, patient life and health improvement goals questionnaire. These will all be scanned into the patient's chart under media.   During the visit, I independently reviewed the patient's EKG, bioimpedance scale results, and indirect calorimeter results. I used this information to tailor a meal plan for the patient that will help her to lose weight and will improve her obesity-related conditions going forward. I performed a medically necessary appropriate examination and/or evaluation. I discussed the assessment and treatment plan with the  patient. The patient was provided an opportunity to ask questions and all were answered. The patient agreed with the plan and demonstrated an understanding of the instructions. Labs were ordered at this visit and will be reviewed at the next visit unless more critical results need to be addressed immediately. I communicated with the referring physician. Clinical information was updated and documented in the EMR.   A separate 15 minutes was spent on risk counseling (see above).   I, Water quality scientist, CMA, am acting as Location manager for [employer], DO.  I have reviewed the above documentation for accuracy and completeness, and I agree with the above. Briscoe Deutscher, DO

## 2019-03-04 LAB — CBC WITH DIFFERENTIAL/PLATELET
Basophils Absolute: 0.1 10*3/uL (ref 0.0–0.2)
Basos: 1 %
EOS (ABSOLUTE): 0.1 10*3/uL (ref 0.0–0.4)
Eos: 2 %
Hemoglobin: 14.2 g/dL (ref 11.1–15.9)
Immature Grans (Abs): 0 10*3/uL (ref 0.0–0.1)
Immature Granulocytes: 0 %
Lymphocytes Absolute: 2.6 10*3/uL (ref 0.7–3.1)
Lymphs: 42 %
MCH: 30.3 pg (ref 26.6–33.0)
MCHC: 33.3 g/dL (ref 31.5–35.7)
MCV: 91 fL (ref 79–97)
Monocytes Absolute: 0.5 10*3/uL (ref 0.1–0.9)
Monocytes: 8 %
Neutrophils Absolute: 2.9 10*3/uL (ref 1.4–7.0)
Neutrophils: 47 %
Platelets: 318 10*3/uL (ref 150–450)
RBC: 4.69 x10E6/uL (ref 3.77–5.28)
RDW: 13.6 % (ref 11.7–15.4)
WBC: 6.1 10*3/uL (ref 3.4–10.8)

## 2019-03-04 LAB — ANEMIA PANEL
Ferritin: 101 ng/mL (ref 15–150)
Folate, Hemolysate: 465 ng/mL
Folate, RBC: 1089 ng/mL (ref >498)
Hematocrit: 42.7 % (ref 34.0–46.6)
Iron Saturation: 19 % (ref 15–55)
Iron: 91 ug/dL (ref 27–139)
Retic Ct Pct: 1.2 % (ref 0.6–2.6)
Total Iron Binding Capacity: 480 ug/dL — ABNORMAL HIGH (ref 250–450)
UIBC: 389 ug/dL — ABNORMAL HIGH (ref 118–369)
Vitamin B-12: 716 pg/mL (ref 232–1245)

## 2019-03-04 LAB — COMPREHENSIVE METABOLIC PANEL
ALT: 22 IU/L (ref 0–32)
AST: 24 IU/L (ref 0–40)
Albumin/Globulin Ratio: 2 (ref 1.2–2.2)
Albumin: 4.5 g/dL (ref 3.8–4.8)
Alkaline Phosphatase: 60 IU/L (ref 39–117)
BUN/Creatinine Ratio: 23 (ref 12–28)
BUN: 16 mg/dL (ref 8–27)
Bilirubin Total: 0.3 mg/dL (ref 0.0–1.2)
CO2: 22 mmol/L (ref 20–29)
Calcium: 9.7 mg/dL (ref 8.7–10.3)
Chloride: 100 mmol/L (ref 96–106)
Creatinine, Ser: 0.7 mg/dL (ref 0.57–1.00)
GFR calc Af Amer: 107 mL/min/{1.73_m2} (ref >59)
GFR calc non Af Amer: 93 mL/min/{1.73_m2} (ref >59)
Globulin, Total: 2.3 g/dL (ref 1.5–4.5)
Glucose: 91 mg/dL (ref 65–99)
Potassium: 4.5 mmol/L (ref 3.5–5.2)
Sodium: 139 mmol/L (ref 134–144)
Total Protein: 6.8 g/dL (ref 6.0–8.5)

## 2019-03-04 LAB — INSULIN, RANDOM: INSULIN: 8.6 u[IU]/mL (ref 2.6–24.9)

## 2019-03-04 LAB — VITAMIN D 25 HYDROXY (VIT D DEFICIENCY, FRACTURES): Vit D, 25-Hydroxy: 42.5 ng/mL (ref 30.0–100.0)

## 2019-03-04 LAB — TSH: TSH: 1.42 u[IU]/mL (ref 0.450–4.500)

## 2019-03-04 LAB — T4, FREE: Free T4: 0.98 ng/dL (ref 0.82–1.77)

## 2019-03-04 LAB — HEMOGLOBIN A1C
Est. average glucose Bld gHb Est-mCnc: 134 mg/dL
Hgb A1c MFr Bld: 6.3 % — ABNORMAL HIGH (ref 4.8–5.6)

## 2019-03-04 LAB — T3: T3, Total: 122 ng/dL (ref 71–180)

## 2019-03-09 ENCOUNTER — Other Ambulatory Visit: Payer: Self-pay

## 2019-03-09 ENCOUNTER — Encounter: Payer: Self-pay | Admitting: Cardiovascular Disease

## 2019-03-09 ENCOUNTER — Ambulatory Visit: Payer: BC Managed Care – PPO | Admitting: Cardiovascular Disease

## 2019-03-09 DIAGNOSIS — I10 Essential (primary) hypertension: Secondary | ICD-10-CM

## 2019-03-09 DIAGNOSIS — E782 Mixed hyperlipidemia: Secondary | ICD-10-CM | POA: Diagnosis not present

## 2019-03-09 MED ORDER — ICOSAPENT ETHYL 1 G PO CAPS: 2.0000 g | ORAL_CAPSULE | Freq: Two times a day (BID) | ORAL | 2 refills | Status: DC

## 2019-03-09 NOTE — Assessment & Plan Note (Signed)
Currently seeing a specialist in the weight reduction clinic on a diet.  She is lost 10 pounds and feels clinically improved.

## 2019-03-09 NOTE — Assessment & Plan Note (Signed)
History of essential hypertension with blood pressure measured today 120/90, improved with adjustment of her lisinopril hydrochlorothiazide dosing by our pharmacist.

## 2019-03-09 NOTE — Progress Notes (Signed)
03/09/2019 Lauren Briggs   Nov 19, 1955  [id number]  Primary Physician Girtha Rm, NP-C Primary Cardiologist: Lorretta Harp MD [name], [place]  HPI:  Lauren Briggs is a 64 y.o.  morbidly overweight married Caucasian female mother of 2 children, grandmother of 3 grandchildren referred by her PCP, Mack Hook, NP and for risk factor modification.  I last saw her in the office 12/02/2018. 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  Risk factors include treated hypertension, hyperlipidemia and family history with a father who died of a myocardial infarction.  She has never had a heart attack or stroke.  She denies chest pain or shortness of breath.  Her major medical issues have been spinal stenosis treated surgically by Dr. Saintclair Halsted in the past on 5 occasions.  She is actively trying to lose weight and is seeing a nutritionist.  Since I saw her 3 months ago she has lost 10 pounds on a diet prescribed by her nutritionist.  She saw our pharmacist for blood pressure management and her lisinopril/hydrochlorothiazide dose was increased resulting in improved blood pressures.  She is otherwise asymptomatic.   Current Meds  Medication Sig  . Acetaminophen (TYLENOL PO) Take by mouth.  . Coenzyme Q10 (COQ-10) 100 MG CAPS otc  . DULoxetine (CYMBALTA) 60 MG capsule Take 60 mg by mouth daily.  Marland Kitchen estradiol (ESTRACE) 1 MG tablet Take 1 mg by mouth every morning.   Marland Kitchen icosapent Ethyl (VASCEPA) 1 g capsule Take 2 capsules (2 g total) by mouth 2 (two) times daily.  Marland Kitchen lisinopril-hydrochlorothiazide (ZESTORETIC) 20-12.5 MG tablet Take 1 tablet by mouth daily.  . Multiple Vitamin (MULTIVITAMIN WITH MINERALS) TABS Take 1 tablet by mouth daily after supper. Women's 50 plus  . simvastatin (ZOCOR) 20 MG tablet Take 1 tablet (20 mg total) by mouth at bedtime.  . [DISCONTINUED] Icosapent Ethyl  (VASCEPA) 1 g CAPS Take 2 capsules (2 g total) by mouth 2 (two) times daily.     Allergies  Allergen Reactions  . Propoxyphene Hcl Palpitations    darvon     Social History   Socioeconomic History  . Marital status: Married    Spouse name: Lanny Hurst  . Number of children: Not on file  . Years of education: Not on file  . Highest education level: Not on file  Occupational History  . Occupation: retired Radio broadcast assistant  Tobacco Use  . Smoking status: Former Smoker    Years: 3.00    Types: Cigarettes    Quit date: 1981    Years since quitting: 40.1  . Smokeless tobacco: Former Systems developer    Quit date: 01/22/1979  Substance and Sexual Activity  . Alcohol use: Yes    Comment: occasional bourbon and ginger ale   . Drug use: No  . Sexual activity: Not on file  Other Topics Concern  . Not on file  Social History Narrative  . Not on file   Social Determinants of Health   Financial Resource Strain:   . Difficulty of Paying Living Expenses: Not on file  Food Insecurity:   . Worried About Charity fundraiser in the Last Year: Not on file  . Ran Out of Food in the Last Year: Not on file  Transportation Needs:   . Lack of Transportation (Medical): Not on file  . Lack of Transportation (Non-Medical): Not on  file  Physical Activity:   . Days of Exercise per Week: Not on file  . Minutes of Exercise per Session: Not on file  Stress:   . Feeling of Stress : Not on file  Social Connections:   . Frequency of Communication with Friends and Family: Not on file  . Frequency of Social Gatherings with Friends and Family: Not on file  . Attends Religious Services: Not on file  . Active Member of Clubs or Organizations: Not on file  . Attends Archivist Meetings: Not on file  . Marital Status: Not on file  Intimate Partner Violence:   . Fear of Current or Ex-Partner: Not on file  . Emotionally Abused: Not on file  . Physically Abused: Not on file  . Sexually Abused: Not on file      Review of Systems: General: negative for chills, fever, night sweats or weight changes.  Cardiovascular: negative for chest pain, dyspnea on exertion, edema, orthopnea, palpitations, paroxysmal nocturnal dyspnea or shortness of breath Dermatological: negative for rash Respiratory: negative for cough or wheezing Urologic: negative for hematuria Abdominal: negative for nausea, vomiting, diarrhea, bright red blood per rectum, melena, or hematemesis Neurologic: negative for visual changes, syncope, or dizziness All other systems reviewed and are otherwise negative except as noted above.    Blood pressure 128/80, pulse 87, height 5\' 4"  (1.626 m), weight 234 lb (106.1 kg).  General appearance: alert and no distress Neck: no adenopathy, no carotid bruit, no JVD, supple, symmetrical, trachea midline and thyroid not enlarged, symmetric, no tenderness/mass/nodules Lungs: clear to auscultation bilaterally Heart: regular rate and rhythm, S1, S2 normal, no murmur, click, rub or gallop Extremities: extremities normal, atraumatic, no cyanosis or edema Pulses: 2+ and symmetric Skin: Skin color, texture, turgor normal. No rashes or lesions Neurologic: Alert and oriented X 3, normal strength and tone. Normal symmetric reflexes. Normal coordination and gait  EKG not performed today  ASSESSMENT AND PLAN:   Mixed hypercholesterolemia and hypertriglyceridemia History of hyperlipidemia and hypertriglyceridemia on simvastatin and Vascepa with lipid profile performed 12/23/2018 revealing a total cholesterol of 223, LDL 113, triglyceride of 335 on Vascepa down from the 500 range and HDL of 52.  I am going refer her to Dr. Debara Pickett for further evaluation management  Essential hypertension History of essential hypertension with blood pressure measured today 120/90, improved with adjustment of her lisinopril hydrochlorothiazide dosing by our pharmacist.  Morbid obesity ([place]) Currently seeing a specialist in the  weight reduction clinic on a diet.  She is lost 10 pounds and feels clinically improved.      Lorretta Harp MD FACP,FACC,FAHA, [facility] 03/09/2019 9:02 AM

## 2019-03-09 NOTE — Patient Instructions (Signed)
Medication Instructions:  Your physician recommends that you continue on your current medications as directed. Please refer to the Current Medication list given to you today.  If you need a refill on your cardiac medications before your next appointment, please call your pharmacy.   Lab work: NONE  Testing/Procedures: NONE  Follow-Up: At [facility], you and your health needs are our priority.  As part of our continuing mission to provide you with exceptional heart care, we have created designated Provider Care Teams.  These Care Teams include your primary Cardiologist (physician) and Advanced Practice Providers (APPs -  Physician Assistants and Nurse Practitioners) who all work together to provide you with the care you need, when you need it. You may see Dr. Gwenlyn Found or one of the following Advanced Practice Providers on your designated Care Team:    Kerin Ransom, PA-C  [place], [place]  Coletta Memos, [place]  Your physician wants you to follow-up in: 6 months with Dr. Andria Rhein will receive a reminder letter in the mail two months in advance. If you don't receive a letter, please call our office to schedule the follow-up appointment.  Any Other Special Instructions Will Be Listed Below (If Applicable). You have been referred to Dr. Lysbeth Penner lipid clinic. Please make an appointment to see him within the next few weeks.

## 2019-03-09 NOTE — Assessment & Plan Note (Signed)
History of hyperlipidemia and hypertriglyceridemia on simvastatin and Vascepa with lipid profile performed 12/23/2018 revealing a total cholesterol of 223, LDL 113, triglyceride of 335 on Vascepa down from the 500 range and HDL of 52.  I am going refer her to Dr. Debara Pickett for further evaluation management

## 2019-03-11 ENCOUNTER — Ambulatory Visit: Payer: BC Managed Care – PPO | Admitting: Podiatry

## 2019-03-16 ENCOUNTER — Other Ambulatory Visit: Payer: Self-pay

## 2019-03-16 ENCOUNTER — Encounter (INDEPENDENT_AMBULATORY_CARE_PROVIDER_SITE_OTHER): Payer: Self-pay | Admitting: Family Medicine

## 2019-03-16 ENCOUNTER — Ambulatory Visit (INDEPENDENT_AMBULATORY_CARE_PROVIDER_SITE_OTHER): Payer: BC Managed Care – PPO | Admitting: Family Medicine

## 2019-03-16 VITALS — BP 130/76 | HR 90 | Temp 98.3°F | Ht 63.0 in | Wt 232.0 lb

## 2019-03-16 DIAGNOSIS — E7849 Other hyperlipidemia: Secondary | ICD-10-CM

## 2019-03-16 DIAGNOSIS — F3289 Other specified depressive episodes: Secondary | ICD-10-CM

## 2019-03-16 DIAGNOSIS — R7303 Prediabetes: Secondary | ICD-10-CM

## 2019-03-16 DIAGNOSIS — I10 Essential (primary) hypertension: Secondary | ICD-10-CM

## 2019-03-16 DIAGNOSIS — Z9189 Other specified personal risk factors, not elsewhere classified: Secondary | ICD-10-CM | POA: Diagnosis not present

## 2019-03-16 DIAGNOSIS — Z6841 Body Mass Index (BMI) 40.0 and over, adult: Secondary | ICD-10-CM

## 2019-03-16 NOTE — Progress Notes (Signed)
Chief Complaint:   OBESITY Lauren Briggs is here to discuss her progress with her obesity treatment plan along with follow-up of her obesity related diagnoses. Lauren Briggs is on the Category 3 Plan and states she is following her eating plan approximately 95% of the time. Lauren Briggs states she is exercising for 0 minutes 0 times per week.  Today's visit was #: 2 Starting weight: 234 lbs Starting date: 03/02/2019 Today's weight: 232 lbs Today's date: 03/16/2019 Total lbs lost to date: 2 lbs Total lbs lost since last in-office visit: 2 lbs  Interim History: Lauren Briggs says she loved the [employer] and [employer].  She ate at [employer] twice but made good choices each time.  Subjective:   1. Prediabetes Lauren Briggs has a diagnosis of prediabetes based on her elevated HgA1c and was informed this puts her at greater risk of developing diabetes. She continues to work on diet and exercise to decrease her risk of diabetes. She denies nausea or hypoglycemia.  Lab Results  Component Value Date   HGBA1C 6.3 (H) 03/02/2019   Lab Results  Component Value Date   INSULIN 8.6 03/02/2019   2. Other hyperlipidemia Lauren Briggs has hyperlipidemia and has been trying to improve her cholesterol levels with intensive lifestyle modification including a low saturated fat diet, exercise and weight loss. She denies any chest pain, claudication or myalgias.  Lab Results  Component Value Date   ALT 22 03/02/2019   AST 24 03/02/2019   ALKPHOS 60 03/02/2019   BILITOT 0.3 03/02/2019   Lab Results  Component Value Date   CHOL 223 (H) 12/23/2018   HDL 52 12/23/2018   LDLCALC 113 (H) 12/23/2018   TRIG 335 (H) 12/23/2018   CHOLHDL 4.3 12/23/2018   3. Essential hypertension Review: taking medications as instructed, no medication side effects noted, no chest pain on exertion, no dyspnea on exertion, no swelling of ankles.   BP Readings from Last 3 Encounters:  03/16/19 130/76  03/09/19 128/80  03/02/19 (!) 146/80   4. Other  depression, with emotional eating Lauren Briggs is struggling with emotional eating and using food for comfort to the extent that it is negatively impacting her health. She has been working on behavior modification techniques to help reduce her emotional eating and has been unsuccessful. She shows no sign of suicidal or homicidal ideations.  5. At risk for heart disease Lauren Briggs is at a higher than average risk for cardiovascular disease due to obesity. Reviewed: no chest pain on exertion, no dyspnea on exertion, and no swelling of ankles.  Assessment/Plan:   1. Prediabetes Lauren Briggs will continue to work on weight loss, exercise, and decreasing simple carbohydrates to help decrease the risk of diabetes.   2. Other hyperlipidemia Cardiovascular risk and specific lipid/LDL goals reviewed.  We discussed several lifestyle modifications today and Lauren Briggs will continue to work on diet, exercise and weight loss efforts. Orders and follow up as documented in patient record.   Counseling Intensive lifestyle modifications are the first line treatment for this issue. . Dietary changes: Increase soluble fiber. Decrease simple carbohydrates. . Exercise changes: Moderate to vigorous-intensity aerobic activity 150 minutes per week if tolerated. . Lipid-lowering medications: see documented in medical record.  3. Essential hypertension Lauren Briggs is working on healthy weight loss and exercise to improve blood pressure control. We will watch for signs of hypotension as she continues her lifestyle modifications.  4. Other depression, with emotional eating Behavior modification techniques were discussed today to help Lauren Briggs deal with her emotional/non-hunger  eating behaviors.  Orders and follow up as documented in patient record.   5. At risk for heart disease Lauren Briggs was given approximately 15 minutes of coronary artery disease prevention counseling today. She is 64 y.o. female and has risk factors for heart disease including  obesity. We discussed intensive lifestyle modifications today with an emphasis on specific weight loss instructions and strategies.   Repetitive spaced learning was employed today to elicit superior memory formation and behavioral change.  6. Class 3 severe obesity with serious comorbidity and body mass index (BMI) of 40.0 to 44.9 in adult, unspecified obesity type [facility]) Lauren Briggs is currently in the action stage of change. As such, her goal is to continue with weight loss efforts. She has agreed to the Category 3 Plan.   Exercise goals: No exercise has been prescribed at this time.  Behavioral modification strategies: increasing lean protein intake and increasing water intake.  Lauren Briggs has agreed to follow-up with our clinic in 2 weeks. She was informed of the importance of frequent follow-up visits to maximize her success with intensive lifestyle modifications for her multiple health conditions.   Objective:   Blood pressure 130/76, pulse 90, temperature 98.3 F (36.8 C), temperature source Oral, height 5\' 3"  (1.6 m), weight 232 lb (105.2 kg), SpO2 97 %. Body mass index is 41.1 kg/m.  General: Cooperative, alert, well developed, in no acute distress. HEENT: Conjunctivae and lids unremarkable. Cardiovascular: Regular rhythm.  Lungs: Normal work of breathing. Neurologic: No focal deficits.   Lab Results  Component Value Date   CREATININE 0.70 03/02/2019   BUN 16 03/02/2019   NA 139 03/02/2019   K 4.5 03/02/2019   CL 100 03/02/2019   CO2 22 03/02/2019   Lab Results  Component Value Date   ALT 22 03/02/2019   AST 24 03/02/2019   ALKPHOS 60 03/02/2019   BILITOT 0.3 03/02/2019   Lab Results  Component Value Date   HGBA1C 6.3 (H) 03/02/2019   HGBA1C 6.3 (H) 11/09/2018   Lab Results  Component Value Date   INSULIN 8.6 03/02/2019   Lab Results  Component Value Date   TSH 1.420 03/02/2019   Lab Results  Component Value Date   CHOL 223 (H) 12/23/2018   HDL 52 12/23/2018    LDLCALC 113 (H) 12/23/2018   TRIG 335 (H) 12/23/2018   CHOLHDL 4.3 12/23/2018   Lab Results  Component Value Date   WBC 6.1 03/02/2019   HGB 14.2 03/02/2019   HCT 42.7 03/02/2019   MCV 91 03/02/2019   PLT 318 03/02/2019   Lab Results  Component Value Date   IRON 91 03/02/2019   TIBC 480 (H) 03/02/2019   FERRITIN 101 03/02/2019   Attestation Statements:   Reviewed by clinician on day of visit: allergies, medications, problem list, medical history, surgical history, family history, social history, and previous encounter notes.  I, Water quality scientist, CMA, am acting as Location manager for [employer], DO.  I have reviewed the above documentation for accuracy and completeness, and I agree with the above. Briscoe Deutscher, DO

## 2019-03-19 ENCOUNTER — Other Ambulatory Visit: Payer: Self-pay

## 2019-03-19 ENCOUNTER — Encounter: Payer: Self-pay | Admitting: Podiatry

## 2019-03-19 ENCOUNTER — Telehealth: Payer: Self-pay | Admitting: *Deleted

## 2019-03-19 ENCOUNTER — Ambulatory Visit: Payer: BC Managed Care – PPO | Admitting: Podiatry

## 2019-03-19 DIAGNOSIS — M778 Other enthesopathies, not elsewhere classified: Secondary | ICD-10-CM | POA: Diagnosis not present

## 2019-03-19 DIAGNOSIS — T148XXA Other injury of unspecified body region, initial encounter: Secondary | ICD-10-CM

## 2019-03-19 NOTE — Telephone Encounter (Signed)
Orders to K. Mavrakis, CMA for pre-cert, faxed to [place].

## 2019-03-19 NOTE — Progress Notes (Signed)
Subjective:   Patient ID: Lauren Briggs, female   DOB: 64 y.o.   MRN: [id number]   HPI Patient states she had around 10 days of relief and now is having severe pain again on the lateral side of the left foot and has had history of 5 back surgeries and has had a lot of other problems ruled out   ROS      Objective:  Physical Exam  Neurovascular status intact with exquisite midfoot pain left foot lateral side with possibility for peroneal tendon tear or injury.  Pain is at the midtarsal joint with narrowness of the joints noted and inflammation of both bone and soft tissue     Assessment:  Possibility for torn tendon peroneal versus possibility for arthritis of the midfoot left lateral     Plan:  H&P reviewed all conditions and at this point I recommended MRI to try to rule out a tear of the peroneal tendon or midfoot arthritis.  I did dispense air fracture walker to try to take some of the pressure off her foot advised on ice therapy and we will see back when we get results of testing

## 2019-03-23 ENCOUNTER — Telehealth: Payer: Self-pay | Admitting: *Deleted

## 2019-03-23 NOTE — Telephone Encounter (Signed)
Pt states she is unable to wear the boot Dr. Paulla Dolly wanted her to wear and she can not wear because of the spine fusion L2 - S1 and it is killing her back and would like to go back into the original brace.

## 2019-03-23 NOTE — Telephone Encounter (Signed)
I told pt to go back in to the brace and supportive athletic shoe and decrease activity. Pt states understanding.

## 2019-03-24 NOTE — Telephone Encounter (Signed)
Ivin Poot, CMA states [employer] requires clinicals prior to pre-cert for MRI. Faxed clinicals and orders to [facility].

## 2019-03-25 NOTE — Telephone Encounter (Signed)
Lauren Briggs AUTHORIZATION:  [id number] for MRI LEFT FOOT [place] CONTRAST [id number]. Faxed to [place] with orders.

## 2019-03-26 DIAGNOSIS — I10 Essential (primary) hypertension: Secondary | ICD-10-CM | POA: Diagnosis not present

## 2019-03-26 DIAGNOSIS — M5440 Lumbago with sciatica, unspecified side: Secondary | ICD-10-CM | POA: Diagnosis not present

## 2019-03-26 DIAGNOSIS — Z6841 Body Mass Index (BMI) 40.0 and over, adult: Secondary | ICD-10-CM | POA: Diagnosis not present

## 2019-03-31 ENCOUNTER — Encounter (INDEPENDENT_AMBULATORY_CARE_PROVIDER_SITE_OTHER): Payer: Self-pay | Admitting: Family Medicine

## 2019-03-31 ENCOUNTER — Ambulatory Visit (INDEPENDENT_AMBULATORY_CARE_PROVIDER_SITE_OTHER): Payer: BC Managed Care – PPO | Admitting: Family Medicine

## 2019-03-31 ENCOUNTER — Other Ambulatory Visit: Payer: Self-pay

## 2019-03-31 VITALS — BP 113/69 | HR 86 | Temp 97.7°F | Ht 63.0 in | Wt 231.0 lb

## 2019-03-31 DIAGNOSIS — Z9189 Other specified personal risk factors, not elsewhere classified: Secondary | ICD-10-CM | POA: Diagnosis not present

## 2019-03-31 DIAGNOSIS — I10 Essential (primary) hypertension: Secondary | ICD-10-CM | POA: Diagnosis not present

## 2019-03-31 DIAGNOSIS — E7849 Other hyperlipidemia: Secondary | ICD-10-CM

## 2019-03-31 DIAGNOSIS — Z6841 Body Mass Index (BMI) 40.0 and over, adult: Secondary | ICD-10-CM

## 2019-03-31 DIAGNOSIS — R7303 Prediabetes: Secondary | ICD-10-CM | POA: Diagnosis not present

## 2019-03-31 DIAGNOSIS — M25572 Pain in left ankle and joints of left foot: Secondary | ICD-10-CM

## 2019-03-31 NOTE — Progress Notes (Signed)
Chief Complaint:   OBESITY Lauren Briggs is here to discuss her progress with her obesity treatment plan along with follow-up of her obesity related diagnoses. [name] is on the Category 3 Plan and states she is following her eating plan approximately 95% of the time. Cedric states she is exercising for 0 minutes 0 times per week.  Today's visit was #: 3 Starting weight: 234 lbs Starting date: 03/02/2019 Today's weight: 231 lbs Today's date: 03/31/2019 Total lbs lost to date: 3 lbs Total lbs lost since last in-office visit: 1 lb  Interim History:  Lauren Briggs provided the following food recall today:  Breakfast:  Oatmeal, blueberry, banana Lunch:  Oatmeal with berries or turkey/chicken sandwich. Snack:  Yogurt, [employer] (1 or 2) Dinner:  Secondary school teacher.  Appetite:   Appetite well controlled? Yes.     Sleep Quality:  She reports sleeping fairly well.  Stress:  The patient indicates a HIGH stress level caused by tax season.  Subjective:   1. Other hyperlipidemia Lauren Briggs has hyperlipidemia and has been trying to improve her cholesterol levels with intensive lifestyle modification including a low saturated fat diet, exercise and weight loss. She denies any chest pain, claudication or myalgias.  She is taking Zocor.  Lab Results  Component Value Date   ALT 22 03/02/2019   AST 24 03/02/2019   ALKPHOS 60 03/02/2019   BILITOT 0.3 03/02/2019   Lab Results  Component Value Date   CHOL 223 (H) 12/23/2018   HDL 52 12/23/2018   LDLCALC 113 (H) 12/23/2018   TRIG 335 (H) 12/23/2018   CHOLHDL 4.3 12/23/2018   2. Prediabetes Lauren Briggs has a diagnosis of prediabetes based on her elevated HgA1c and was informed this puts her at greater risk of developing diabetes. She continues to work on diet and exercise to decrease her risk of diabetes. She denies nausea or hypoglycemia.  Lab Results  Component Value Date   HGBA1C 6.3 (H) 03/02/2019   Lab Results  Component Value Date   INSULIN 8.6 03/02/2019   3. Essential hypertension Review: taking medications as instructed, no medication side effects noted, no chest pain on exertion, no dyspnea on exertion, no swelling of ankles.  She is taking lisinopril-HCTZ.  BP Readings from Last 3 Encounters:  03/31/19 113/69  03/16/19 130/76  03/09/19 128/80   4. At risk for heart disease Lauren Briggs is at a higher than average risk for cardiovascular disease due to obesity. Reviewed: no chest pain on exertion, no dyspnea on exertion, and no swelling of ankles.  Assessment/Plan:   1. Other hyperlipidemia Cardiovascular risk and specific lipid/LDL goals reviewed.  We discussed several lifestyle modifications today and Lauren Briggs will continue to work on diet, exercise and weight loss efforts. Orders and follow up as documented in patient record.   Counseling Intensive lifestyle modifications are the first line treatment for this issue. . Dietary changes: Increase soluble fiber. Decrease simple carbohydrates. . Exercise changes: Moderate to vigorous-intensity aerobic activity 150 minutes per week if tolerated. . Lipid-lowering medications: see documented in medical record.  2. Prediabetes Lauren Briggs will continue to work on weight loss, exercise, and decreasing simple carbohydrates to help decrease the risk of diabetes.   3. Essential hypertension Lauren Briggs is working on healthy weight loss and exercise to improve blood pressure control. We will watch for signs of hypotension as she continues her lifestyle modifications.  4. Acute left ankle pain Patient was seen by Podiatry. MRI pending. Boot given but patient did not tolerate due  to recent back surgery (within the past year). Reviewed visits with Podiatry and Neurosurgery with the patient.   5. Class 3 severe obesity with serious comorbidity and body mass index (BMI) of 40.0 to 44.9 in adult, unspecified obesity type [facility]) Lauren Briggs is currently in the action stage of change. As such, her goal  is to continue with weight loss efforts. She has agreed to keeping a food journal and adhering to recommended goals of 1500 calories and 90 grams of protein.   Exercise goals: All adults should avoid inactivity. Some physical activity is better than none, and adults who participate in any amount of physical activity gain some health benefits.  Behavioral modification strategies: increasing lean protein intake and increasing water intake.  Lauren Briggs has agreed to follow-up with our clinic in 2 weeks. She was informed of the importance of frequent follow-up visits to maximize her success with intensive lifestyle modifications for her multiple health conditions.   Objective:   Blood pressure 113/69, pulse 86, temperature 97.7 F (36.5 C), temperature source Oral, height 5\' 3"  (1.6 m), weight 231 lb (104.8 kg), SpO2 98 %. Body mass index is 40.92 kg/m.  General: Cooperative, alert, well developed, in no acute distress. HEENT: Conjunctivae and lids unremarkable. Cardiovascular: Regular rhythm.  Lungs: Normal work of breathing. Neurologic: No focal deficits.   Lab Results  Component Value Date   CREATININE 0.70 03/02/2019   BUN 16 03/02/2019   NA 139 03/02/2019   K 4.5 03/02/2019   CL 100 03/02/2019   CO2 22 03/02/2019   Lab Results  Component Value Date   ALT 22 03/02/2019   AST 24 03/02/2019   ALKPHOS 60 03/02/2019   BILITOT 0.3 03/02/2019   Lab Results  Component Value Date   HGBA1C 6.3 (H) 03/02/2019   HGBA1C 6.3 (H) 11/09/2018   Lab Results  Component Value Date   INSULIN 8.6 03/02/2019   Lab Results  Component Value Date   TSH 1.420 03/02/2019   Lab Results  Component Value Date   CHOL 223 (H) 12/23/2018   HDL 52 12/23/2018   LDLCALC 113 (H) 12/23/2018   TRIG 335 (H) 12/23/2018   CHOLHDL 4.3 12/23/2018   Lab Results  Component Value Date   WBC 6.1 03/02/2019   HGB 14.2 03/02/2019   HCT 42.7 03/02/2019   MCV 91 03/02/2019   PLT 318 03/02/2019   Lab Results   Component Value Date   IRON 91 03/02/2019   TIBC 480 (H) 03/02/2019   FERRITIN 101 03/02/2019   Attestation Statements:   Reviewed by clinician on day of visit: allergies, medications, problem list, medical history, surgical history, family history, social history, and previous encounter notes.  I performed a medically necessary appropriate examination and/or evaluation. I discussed the assessment and treatment plan with the patient. The patient was provided an opportunity to ask questions and all were answered. The patient agreed with the plan and demonstrated an understanding of the instructions. Clinical information was updated and documented in the EMR. Time spent on visit including pre-visit chart review and post-visit care was 25 minutes.  I, Water quality scientist, CMA, am acting as Location manager for [employer], DO.  I have reviewed the above documentation for accuracy and completeness, and I agree with the above. Briscoe Deutscher, DO

## 2019-04-08 ENCOUNTER — Ambulatory Visit
Admission: RE | Admit: 2019-04-08 | Discharge: 2019-04-08 | Disposition: A | Discharge status: Home / Self Care | Payer: BC Managed Care – PPO | Source: Ambulatory Visit | Attending: Podiatry | Admitting: Podiatry

## 2019-04-08 DIAGNOSIS — S86312A Strain of muscle(s) and tendon(s) of peroneal muscle group at lower leg level, left leg, initial encounter: Secondary | ICD-10-CM | POA: Diagnosis not present

## 2019-04-13 NOTE — Telephone Encounter (Signed)
I think this would be a good case for you. If you don't want it let me know and I will refer to one of the other guys. She had cyst removed in the midfoot by orthopod that was more related to the arthritis. Has good amount of pain peroneal tendon

## 2019-04-16 ENCOUNTER — Other Ambulatory Visit: Payer: Self-pay

## 2019-04-16 ENCOUNTER — Encounter: Payer: Self-pay | Admitting: Internal Medicine

## 2019-04-16 ENCOUNTER — Ambulatory Visit (INDEPENDENT_AMBULATORY_CARE_PROVIDER_SITE_OTHER): Payer: BC Managed Care – PPO | Admitting: Internal Medicine

## 2019-04-16 VITALS — BP 118/72 | HR 91 | Temp 95.4°F | Ht 64.0 in | Wt 233.0 lb

## 2019-04-16 DIAGNOSIS — I10 Essential (primary) hypertension: Secondary | ICD-10-CM

## 2019-04-16 DIAGNOSIS — R931 Abnormal findings on diagnostic imaging of heart and coronary circulation: Secondary | ICD-10-CM

## 2019-04-16 DIAGNOSIS — E782 Mixed hyperlipidemia: Secondary | ICD-10-CM | POA: Diagnosis not present

## 2019-04-16 MED ORDER — EZETIMIBE 10 MG PO TABS: 10.0000 mg | ORAL_TABLET | Freq: Every day | ORAL | 3 refills | Status: DC

## 2019-04-16 NOTE — Progress Notes (Signed)
LIPID CLINIC CONSULT NOTE  Chief Complaint:  Manage dyslipidemia  Primary Care Physician: Lauren Rm, NP-C  Primary Cardiologist:  No primary care provider on file.  HPI:  Lauren Briggs is a 64 y.o. female who is being seen today for the evaluation of dyslipidemia at the request of Lauren Harp, MD.  This is a pleasant 64 year old female kindly referred by Dr. Alvester Briggs for evaluation management of dyslipidemia.  She has a history of known coronary artery calcification with a calcium score 92, 81st percentile for age and sex matched control as of December 21, 2018.  Her most recent lipids showed total cholesterol 223, triglycerides 335, HDL 52 and LDL 113.  Her previous lipid profile showed triglycerides of almost 600 and LDL of 164.  Subsequently she has been on Vascepa 2 g twice daily in addition to simvastatin 20 mg nightly.  Overall she is tolerating the medicines well.  In addition she has been followed at the weight management center.  She has started to lose some weight although her BMI still is 40.  PMHx:  Past Medical History:  Diagnosis Date  . Allergy   . Arthritis   . Back pain   . Complication of anesthesia    HX Back CHIPPED TOOTH S/P EXTUBATION   . Hyperlipidemia   . Hypertension   . Low back pain radiating to right leg   . 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 Neuromuscular disorder ([place])   . Prediabetes 11/10/2018  . Shortness of breath dyspnea     Past Surgical History:  Procedure Laterality Date  . ABDOMINAL HYSTERECTOMY     PARTIAL  . ANTERIOR LAT LUMBAR FUSION N/A 08/14/2018   Procedure: extreme lateral interbody fusion - Lumbar two-Lumbar three  cortical screws and removal pedicle screws Lumbar -Sacral one;  Surgeon: Kary Kos, MD;  Location: [facility];  Service: Neurosurgery;  Laterality: N/A;  . APPENDECTOMY    . BACK SURGERY     times 4  . CARPAL TUNNEL RELEASE     BIL   . CESAREAN SECTION     PARTIAL HYST  .  CLOSED REDUCTION ULNAR SHAFT    . COLONOSCOPY    . FOOT SURGERY     LT FOOT CYST REMOVED   . HARDWARE REMOVAL N/A 08/14/2018   Procedure: Removal pedicle screws Lumbar -Sacral one with cortical screw fixation lumbar two-three;  Surgeon: Kary Kos, MD;  Location: [facility];  Service: Neurosurgery;  Laterality: N/A;  . NASAL SINUS SURGERY     1994   . radial head Right 01/14/2016   at [facility]  . RADIAL HEAD ARTHROPLASTY Right 01/26/2016   Procedure: RIGHT RADIAL HEAD REPLACEMENT ARTHROPLASTY WITH LIGAMENT REPAIRS;  Surgeon: Charlotte Crumb, MD;  Location: [place] [facility];  Service: Orthopedics;  Laterality: Right;  Right elbow raial head replacement with possible ligament repairs as needed   . UPPER GASTROINTESTINAL ENDOSCOPY      FAMHx:  Family History  Problem Relation Age of Onset  . Dementia Mother   . Heart attack Mother   . Hypertension Mother   . Heart disease Mother   . Hyperlipidemia Mother   . Heart attack Father 69  . Heart disease Father   . Sudden death Father   . Hyperlipidemia Father   . Heart attack Maternal Grandmother 91  . Stroke Maternal Grandmother   . Colon cancer Neg Hx   . Colon polyps Neg Hx   . Esophageal cancer Neg Hx   .  Stomach cancer Neg Hx   . Rectal cancer Neg Hx     SOCHx:   reports that she quit smoking about 40 years ago. Her smoking use included cigarettes. She quit after 3.00 years of use. She quit smokeless tobacco use about 40 years ago. She reports current alcohol use. She reports that she does not use drugs.  ALLERGIES:  Allergies  Allergen Reactions  . Propoxyphene Hcl Palpitations    darvon     ROS: Pertinent items noted in HPI and remainder of comprehensive ROS otherwise negative.  HOME MEDS: Current Outpatient Medications on File Prior to Visit  Medication Sig Dispense Refill  . Acetaminophen (TYLENOL PO) Take by mouth.    . Coenzyme Q10 (COQ-10) 100 MG CAPS otc 30 capsule 0  . DULoxetine (CYMBALTA) 60 MG capsule  Take 60 mg by mouth daily.    Marland Kitchen estradiol (ESTRACE) 1 MG tablet Take 1 mg by mouth every morning.     Marland Kitchen icosapent Ethyl (VASCEPA) 1 g capsule Take 2 capsules (2 g total) by mouth 2 (two) times daily. 120 capsule 2  . lisinopril-hydrochlorothiazide (ZESTORETIC) 20-12.5 MG tablet Take 1 tablet by mouth daily. 90 tablet 1  . Multiple Vitamin (MULTIVITAMIN WITH MINERALS) TABS Take 1 tablet by mouth daily after supper. Women's 50 plus    . simvastatin (ZOCOR) 20 MG tablet Take 1 tablet (20 mg total) by mouth at bedtime. 90 tablet 1  . HYDROcodone-acetaminophen (NORCO) 10-325 MG tablet Take 1 tablet by mouth every 4 (four) hours as needed.     No current facility-administered medications on file prior to visit.    LABS/IMAGING: No results found for this or any previous visit (from the past 48 hour(s)). No results found.  LIPID PANEL:    Component Value Date/Time   CHOL 223 (H) 12/23/2018 0853   TRIG 335 (H) 12/23/2018 0853   HDL 52 12/23/2018 0853   CHOLHDL 4.3 12/23/2018 0853   CHOLHDL 4.1 09/01/2014 0933   VLDL 55 (H) 09/01/2014 0933   LDLCALC 113 (H) 12/23/2018 0853    WEIGHTS: Wt Readings from Last 3 Encounters:  04/16/19 233 lb (105.7 kg)  03/31/19 231 lb (104.8 kg)  03/16/19 232 lb (105.2 kg)    VITALS: BP 118/72   Pulse 91   Temp (!) 95.4 F (35.2 C)   Ht 5\' 4"  (1.626 m)   Wt 233 lb (105.7 kg)   SpO2 99%   BMI 39.99 kg/m   EXAM: Deferred  EKG: Deferred  ASSESSMENT: 1. Mixed dyslipidemia, goal LDL less than 70 2. Coronary artery calcification-CAC score 69 3. Morbid obesity 4. Hypertension  PLAN: 1.   [name] has a mixed dyslipidemia with a metabolic pattern and high triglycerides, moderate HDL and high LDL cholesterol.  She has had some improvement on Vascepa and simvastatin.  She needs additional lipid-lowering and I think would benefit from ezetimibe.  Hopefully between that and significant weight loss and dietary changes her target LDL will be reached.   If this is well-tolerated then we could consider combining the simvastatin and ezetimibe (generic Vytorin) to reduce her pill burden.  Plan follow-up with repeat lipids in 3 months.  Thanks again for the kind referral.  Lauren Casino, MD, FACC, [place] Director of the [facility] Lipid [facility]  Cardiovascular Risk Reduction Clinic Diplomate of the American Board of Clinical Lipidology Attending Cardiologist  Direct Dial: [phone]  Fax: [phone]  Website:  [email or web address]  Chrissie Noa  C Zakir Henner 04/16/2019, 1:05 PM

## 2019-04-16 NOTE — Patient Instructions (Signed)
Medication Instructions:  Your physician has recommended you make the following change in your medication: START zetia 10mg  daily  *If you need a refill on your cardiac medications before your next appointment, please call your pharmacy*   Lab Work: FASTING lab work in 3-4 months to check cholesterol   If you have labs (blood work) drawn today and your tests are completely normal, you will receive your results only by: Marland Kitchen MyChart Message (if you have MyChart) OR . A paper copy in the mail If you have any lab test that is abnormal or we need to change your treatment, we will call you to review the results.   Testing/Procedures: NONE   Follow-Up: At [facility], you and your health needs are our priority.  As part of our continuing mission to provide you with exceptional heart care, we have created designated Provider Care Teams.  These Care Teams include your primary Cardiologist (physician) and Advanced Practice Providers (APPs -  Physician Assistants and Nurse Practitioners) who all work together to provide you with the care you need, when you need it.  We recommend signing up for the patient portal called "MyChart".  Sign up information is provided on this After Visit Summary.  MyChart is used to connect with patients for Virtual Visits (Telemedicine).  Patients are able to view lab/test results, encounter notes, upcoming appointments, etc.  Non-urgent messages can be sent to your provider as well.   To learn more about what you can do with MyChart, go to [email or web address].    Your next appointment:   3 month(s)  The format for your next appointment:   Either In Person or Virtual  Provider:   Raliegh Ip Mali Hilty, MD   Other Instructions

## 2019-04-25 ENCOUNTER — Other Ambulatory Visit: Payer: Self-pay | Admitting: Family Medicine

## 2019-04-25 ENCOUNTER — Other Ambulatory Visit: Payer: Self-pay | Admitting: Cardiovascular Disease

## 2019-04-25 DIAGNOSIS — E782 Mixed hyperlipidemia: Secondary | ICD-10-CM

## 2019-04-26 ENCOUNTER — Encounter (INDEPENDENT_AMBULATORY_CARE_PROVIDER_SITE_OTHER): Payer: Self-pay | Admitting: Family Medicine

## 2019-04-26 ENCOUNTER — Ambulatory Visit (INDEPENDENT_AMBULATORY_CARE_PROVIDER_SITE_OTHER): Payer: BC Managed Care – PPO | Admitting: Family Medicine

## 2019-04-26 ENCOUNTER — Other Ambulatory Visit: Payer: Self-pay

## 2019-04-26 VITALS — BP 122/75 | HR 82 | Temp 98.1°F | Ht 64.0 in | Wt 229.0 lb

## 2019-04-26 DIAGNOSIS — E782 Mixed hyperlipidemia: Secondary | ICD-10-CM

## 2019-04-26 DIAGNOSIS — R7303 Prediabetes: Secondary | ICD-10-CM | POA: Diagnosis not present

## 2019-04-26 DIAGNOSIS — G8929 Other chronic pain: Secondary | ICD-10-CM

## 2019-04-26 DIAGNOSIS — Z9189 Other specified personal risk factors, not elsewhere classified: Secondary | ICD-10-CM | POA: Diagnosis not present

## 2019-04-26 DIAGNOSIS — I10 Essential (primary) hypertension: Secondary | ICD-10-CM

## 2019-04-26 DIAGNOSIS — M25572 Pain in left ankle and joints of left foot: Secondary | ICD-10-CM | POA: Diagnosis not present

## 2019-04-26 DIAGNOSIS — Z6841 Body Mass Index (BMI) 40.0 and over, adult: Secondary | ICD-10-CM

## 2019-04-26 NOTE — Progress Notes (Signed)
Chief Complaint:   OBESITY Lauren Briggs is here to discuss her progress with her obesity treatment plan along with follow-up of her obesity related diagnoses. Lauren Briggs is on the Category 3 Plan and states she is following her eating plan approximately 96% of the time. Lauren Briggs states she is exercising for 0 minutes 0 times per week.  Today's visit was #: 4 Starting weight: 234 lbs Starting date: 03/02/2019 Today's weight: 229 lbs Today's date: 04/26/2019 Total lbs lost to date: 5 lbs Total lbs lost since last in-office visit: 2 lbs  Interim History: Lauren Briggs says, "I'd have done better if it hadn't been for you and those Yasso bars."  She is going to [place] on the 19th, and will be staying for 2 weeks.   Subjective:   1. Prediabetes Lauren Briggs has a diagnosis of prediabetes based on her elevated HgA1c and was informed this puts her at greater risk of developing diabetes. She continues to work on diet and exercise to decrease her risk of diabetes. She denies nausea or hypoglycemia.  Lab Results  Component Value Date   HGBA1C 6.3 (H) 03/02/2019   Lab Results  Component Value Date   INSULIN 8.6 03/02/2019   2. Essential hypertension Review: taking medications as instructed, no medication side effects noted, no chest pain on exertion, no dyspnea on exertion, no swelling of ankles.   BP Readings from Last 3 Encounters:  04/26/19 122/75  04/16/19 118/72  03/31/19 113/69   3. Mixed hyperlipidemia Lauren Briggs has hyperlipidemia and has been trying to improve her cholesterol levels with intensive lifestyle modification including a low saturated fat diet, exercise and weight loss. She denies any chest pain, claudication or myalgias.  Her goal is a LDL below 70.  She is followed by the lipid clinic.  Lab Results  Component Value Date   ALT 22 03/02/2019   AST 24 03/02/2019   ALKPHOS 60 03/02/2019   BILITOT 0.3 03/02/2019   Lab Results  Component Value Date   CHOL 223 (H) 12/23/2018   HDL 52  12/23/2018   LDLCALC 113 (H) 12/23/2018   TRIG 335 (H) 12/23/2018   CHOLHDL 4.3 12/23/2018   4. Chronic pain of left ankle, with tendon tear She has been referred to [facility].  She usually wears a brace.  Assessment/Plan:   1. Prediabetes Lauren Briggs will continue to work on weight loss, exercise, and decreasing simple carbohydrates to help decrease the risk of diabetes.   2. Essential hypertension Lauren Briggs is working on healthy weight loss and exercise to improve blood pressure control. We will watch for signs of hypotension as she continues her lifestyle modifications.  3. Mixed hyperlipidemia Cardiovascular risk and specific lipid/LDL goals reviewed.  We discussed several lifestyle modifications today and Lauren Briggs will continue to work on diet, exercise and weight loss efforts. Orders and follow up as documented in patient record.   Counseling Intensive lifestyle modifications are the first line treatment for this issue. . Dietary changes: Increase soluble fiber. Decrease simple carbohydrates. . Exercise changes: Moderate to vigorous-intensity aerobic activity 150 minutes per week if tolerated. . Lipid-lowering medications: see documented in medical record.  4. Chronic pain of left ankle, with tendon tear Will follow because mobility and pain control are important for weight management.  5. At risk for diabetes mellitus Lauren Briggs was given approximately 15 minutes of diabetes education and counseling today. We discussed intensive lifestyle modifications today with an emphasis on weight loss as well as increasing exercise and decreasing simple carbohydrates  in her diet. We also reviewed medication options with an emphasis on risk versus benefit of those discussed.   Repetitive spaced learning was employed today to elicit superior memory formation and behavioral change.  6. Class 3 severe obesity with serious comorbidity and body mass index (BMI) of 40.0 to 44.9 in adult, unspecified obesity  type Lauren Briggs) Lauren Briggs is currently in the action stage of change. As such, her goal is to continue with weight loss efforts. She has agreed to the Category 3 Plan.   Exercise goals: All adults should avoid inactivity. Some physical activity is better than none, and adults who participate in any amount of physical activity gain some health benefits.  Behavioral modification strategies: increasing lean protein intake and decreasing simple carbohydrates.  Lauren Briggs has agreed to follow-up with our clinic in 2 weeks. She was informed of the importance of frequent follow-up visits to maximize her success with intensive lifestyle modifications for her multiple health conditions.   Objective:   Blood pressure 122/75, pulse 82, temperature 98.1 F (36.7 C), temperature source Oral, height 5\' 4"  (1.626 m), weight 229 lb (103.9 kg), SpO2 96 %. Body mass index is 39.31 kg/m.  General: Cooperative, alert, well developed, in no acute distress. HEENT: Conjunctivae and lids unremarkable. Cardiovascular: Regular rhythm.  Lungs: Normal work of breathing. Neurologic: No focal deficits.   Lab Results  Component Value Date   CREATININE 0.70 03/02/2019   BUN 16 03/02/2019   NA 139 03/02/2019   K 4.5 03/02/2019   CL 100 03/02/2019   CO2 22 03/02/2019   Lab Results  Component Value Date   ALT 22 03/02/2019   AST 24 03/02/2019   ALKPHOS 60 03/02/2019   BILITOT 0.3 03/02/2019   Lab Results  Component Value Date   HGBA1C 6.3 (H) 03/02/2019   HGBA1C 6.3 (H) 11/09/2018   Lab Results  Component Value Date   INSULIN 8.6 03/02/2019   Lab Results  Component Value Date   TSH 1.420 03/02/2019   Lab Results  Component Value Date   CHOL 223 (H) 12/23/2018   HDL 52 12/23/2018   LDLCALC 113 (H) 12/23/2018   TRIG 335 (H) 12/23/2018   CHOLHDL 4.3 12/23/2018   Lab Results  Component Value Date   WBC 6.1 03/02/2019   HGB 14.2 03/02/2019   HCT 42.7 03/02/2019   MCV 91 03/02/2019   PLT 318 03/02/2019    Lab Results  Component Value Date   IRON 91 03/02/2019   TIBC 480 (H) 03/02/2019   FERRITIN 101 03/02/2019   Attestation Statements:   Reviewed by clinician on day of visit: allergies, medications, problem list, medical history, surgical history, family history, social history, and previous encounter notes.  I, Water quality scientist, CMA, am acting as Location manager for [employer], DO.  I have reviewed the above documentation for accuracy and completeness, and I agree with the above. Briscoe Deutscher, DO

## 2019-05-24 ENCOUNTER — Other Ambulatory Visit: Payer: Self-pay

## 2019-05-24 ENCOUNTER — Encounter (INDEPENDENT_AMBULATORY_CARE_PROVIDER_SITE_OTHER): Payer: Self-pay | Admitting: Family Medicine

## 2019-05-24 ENCOUNTER — Ambulatory Visit (INDEPENDENT_AMBULATORY_CARE_PROVIDER_SITE_OTHER): Payer: BC Managed Care – PPO | Admitting: Family Medicine

## 2019-05-24 VITALS — BP 128/72 | HR 80 | Temp 98.5°F | Ht 64.0 in | Wt 226.0 lb

## 2019-05-24 DIAGNOSIS — Z6838 Body mass index (BMI) 38.0-38.9, adult: Secondary | ICD-10-CM

## 2019-05-24 DIAGNOSIS — Z9189 Other specified personal risk factors, not elsewhere classified: Secondary | ICD-10-CM

## 2019-05-24 DIAGNOSIS — M25572 Pain in left ankle and joints of left foot: Secondary | ICD-10-CM

## 2019-05-24 DIAGNOSIS — G8929 Other chronic pain: Secondary | ICD-10-CM

## 2019-05-24 DIAGNOSIS — E782 Mixed hyperlipidemia: Secondary | ICD-10-CM

## 2019-05-24 DIAGNOSIS — R7303 Prediabetes: Secondary | ICD-10-CM

## 2019-05-24 NOTE — Progress Notes (Signed)
Chief Complaint:   OBESITY Lauren Briggs is here to discuss her progress with her obesity treatment plan along with follow-up of her obesity related diagnoses. Lauren Briggs is on the Category 3 Plan and states she is following her eating plan approximately 90% of the time. Lauren Briggs states she is exercising for 0 minutes 0 times per week.  Today's visit was #: 5 Starting weight: 234 lbs Starting date: 03/02/2019 Today's weight: 226 lbs Today's date: 05/24/2019 Total lbs lost to date: 8 lbs Total lbs lost since last in-office visit: 3 lbs  Interim History: Lauren Briggs provided the following food recall today:  Breakfast:  Protein oatmeal. Lunch:  Protein drink or deli meat and cheese. Dinner:  Tai by [employer]. She also enjoyed 2 Yasso bars a day.  Subjective:   1. Mixed hyperlipidemia Lauren Briggs has hyperlipidemia and has been trying to improve her cholesterol levels with intensive lifestyle modification including a low saturated fat diet, exercise and weight loss. She denies any chest pain, claudication or myalgias.  She is followed by Dr. Debara Pickett.  The 10-year ASCVD risk score Lauren Briggs DC Lauren Bonito., et al., 2013) is: 6.7%   Values used to calculate the score:     Age: 89 years     Sex: Female     Is Non-Hispanic African American: No     Diabetic: No     Tobacco smoker: No     Systolic Blood Pressure: 951 mmHg     Is BP treated: Yes     HDL Cholesterol: 52 mg/dL     Total Cholesterol: 223 mg/dL  Lab Results  Component Value Date   ALT 22 03/02/2019   AST 24 03/02/2019   ALKPHOS 60 03/02/2019   BILITOT 0.3 03/02/2019   Lab Results  Component Value Date   CHOL 223 (H) 12/23/2018   HDL 52 12/23/2018   LDLCALC 113 (H) 12/23/2018   TRIG 335 (H) 12/23/2018   CHOLHDL 4.3 12/23/2018   2. Chronic pain of left ankle, with tendon tear Followed by [facility], Dr. Doran Durand.  Wears a brace. She has a follow up appointment this week.  3. Prediabetes Lauren Briggs has a diagnosis of prediabetes based on her elevated HgA1c  and was informed this puts her at greater risk of developing diabetes. She continues to work on diet and exercise to decrease her risk of diabetes. She denies nausea or hypoglycemia.  Lab Results  Component Value Date   HGBA1C 6.3 (H) 03/02/2019   Lab Results  Component Value Date   INSULIN 8.6 03/02/2019   Assessment/Plan:   1. Mixed hyperlipidemia Cardiovascular risk and specific lipid/LDL goals reviewed.  We discussed several lifestyle modifications today and [name] will continue to work on diet, exercise and weight loss efforts. Orders and follow up as documented in patient record.   Counseling Intensive lifestyle modifications are the first line treatment for this issue. . Dietary changes: Increase soluble fiber. Decrease simple carbohydrates. . Exercise changes: Moderate to vigorous-intensity aerobic activity 150 minutes per week if tolerated. . Lipid-lowering medications: see documented in medical record.  2. Chronic pain of left ankle, with tendon tear Will follow because mobility and pain control are important for weight management.  3. Prediabetes Lauren Briggs will continue to work on weight loss, exercise, and decreasing simple carbohydrates to help decrease the risk of diabetes.   4. At risk for heart disease Lauren Briggs was given approximately 15 minutes of coronary artery disease prevention counseling today. She is 64 y.o. female and has risk factors  for heart disease including obesity. We discussed intensive lifestyle modifications today with an emphasis on specific weight loss instructions and strategies.   Repetitive spaced learning was employed today to elicit superior memory formation and behavioral change.  5. Class 2 severe obesity with serious comorbidity and body mass index (BMI) of 38.0 to 38.9 in adult, unspecified obesity type [facility]) Lauren Briggs is currently in the action stage of change. As such, her goal is to continue with weight loss efforts. She has agreed to the Category 3  Plan.   Exercise goals: No exercise has been prescribed at this time until Marleny is cleared by Ortho.  Behavioral modification strategies: increasing lean protein intake and increasing water intake.  Lauren Briggs has agreed to follow-up with our clinic in 2 weeks. She was informed of the importance of frequent follow-up visits to maximize her success with intensive lifestyle modifications for her multiple health conditions.   Objective:   Blood pressure 128/72, pulse 80, temperature 98.5 F (36.9 C), temperature source Oral, height '5\' 4"'  (1.626 m), weight 226 lb (102.5 kg), SpO2 97 %. Body mass index is 38.79 kg/m.  General: Cooperative, alert, well developed, in no acute distress. HEENT: Conjunctivae and lids unremarkable. Cardiovascular: Regular rhythm.  Lungs: Normal work of breathing. Neurologic: No focal deficits.   Lab Results  Component Value Date   CREATININE 0.70 03/02/2019   BUN 16 03/02/2019   NA 139 03/02/2019   K 4.5 03/02/2019   CL 100 03/02/2019   CO2 22 03/02/2019   Lab Results  Component Value Date   ALT 22 03/02/2019   AST 24 03/02/2019   ALKPHOS 60 03/02/2019   BILITOT 0.3 03/02/2019   Lab Results  Component Value Date   HGBA1C 6.3 (H) 03/02/2019   HGBA1C 6.3 (H) 11/09/2018   Lab Results  Component Value Date   INSULIN 8.6 03/02/2019   Lab Results  Component Value Date   TSH 1.420 03/02/2019   Lab Results  Component Value Date   CHOL 223 (H) 12/23/2018   HDL 52 12/23/2018   LDLCALC 113 (H) 12/23/2018   TRIG 335 (H) 12/23/2018   CHOLHDL 4.3 12/23/2018   Lab Results  Component Value Date   WBC 6.1 03/02/2019   HGB 14.2 03/02/2019   HCT 42.7 03/02/2019   MCV 91 03/02/2019   PLT 318 03/02/2019   Lab Results  Component Value Date   IRON 91 03/02/2019   TIBC 480 (H) 03/02/2019   FERRITIN 101 03/02/2019   Attestation Statements:   Reviewed by clinician on day of visit: allergies, medications, problem list, medical history, surgical  history, family history, social history, and previous encounter notes.  I, Water quality scientist, CMA, am acting as Location manager for [employer], DO.  I have reviewed the above documentation for accuracy and completeness, and I agree with the above. Briscoe Deutscher, DO

## 2019-05-26 DIAGNOSIS — M7672 Peroneal tendinitis, left leg: Secondary | ICD-10-CM | POA: Diagnosis not present

## 2019-05-26 DIAGNOSIS — I10 Essential (primary) hypertension: Secondary | ICD-10-CM | POA: Diagnosis not present

## 2019-05-26 LAB — BASIC METABOLIC PANEL
BUN/Creatinine Ratio: 32 — ABNORMAL HIGH (ref 12–28)
BUN: 20 mg/dL (ref 8–27)
CO2: 27 mmol/L (ref 20–29)
Calcium: 9.9 mg/dL (ref 8.7–10.3)
Chloride: 99 mmol/L (ref 96–106)
Creatinine, Ser: 0.63 mg/dL (ref 0.57–1.00)
GFR calc Af Amer: 110 mL/min/{1.73_m2} (ref >59)
GFR calc non Af Amer: 96 mL/min/{1.73_m2} (ref >59)
Glucose: 95 mg/dL (ref 65–99)
Potassium: 5.2 mmol/L (ref 3.5–5.2)
Sodium: 139 mmol/L (ref 134–144)

## 2019-05-27 ENCOUNTER — Other Ambulatory Visit: Payer: Self-pay | Admitting: Cardiovascular Disease

## 2019-05-27 DIAGNOSIS — E782 Mixed hyperlipidemia: Secondary | ICD-10-CM

## 2019-06-09 DIAGNOSIS — M79672 Pain in left foot: Secondary | ICD-10-CM | POA: Diagnosis not present

## 2019-06-09 DIAGNOSIS — M7672 Peroneal tendinitis, left leg: Secondary | ICD-10-CM | POA: Diagnosis not present

## 2019-06-11 DIAGNOSIS — M79672 Pain in left foot: Secondary | ICD-10-CM | POA: Diagnosis not present

## 2019-06-11 DIAGNOSIS — M7672 Peroneal tendinitis, left leg: Secondary | ICD-10-CM | POA: Diagnosis not present

## 2019-06-14 ENCOUNTER — Ambulatory Visit (INDEPENDENT_AMBULATORY_CARE_PROVIDER_SITE_OTHER): Payer: BC Managed Care – PPO | Admitting: Family Medicine

## 2019-06-14 ENCOUNTER — Encounter (INDEPENDENT_AMBULATORY_CARE_PROVIDER_SITE_OTHER): Payer: Self-pay | Admitting: Family Medicine

## 2019-06-14 ENCOUNTER — Other Ambulatory Visit: Payer: Self-pay

## 2019-06-14 VITALS — BP 119/77 | HR 81 | Temp 98.3°F | Ht 64.0 in | Wt 223.0 lb

## 2019-06-14 DIAGNOSIS — G8929 Other chronic pain: Secondary | ICD-10-CM

## 2019-06-14 DIAGNOSIS — M25572 Pain in left ankle and joints of left foot: Secondary | ICD-10-CM

## 2019-06-14 DIAGNOSIS — Z6838 Body mass index (BMI) 38.0-38.9, adult: Secondary | ICD-10-CM

## 2019-06-14 DIAGNOSIS — R7303 Prediabetes: Secondary | ICD-10-CM

## 2019-06-14 DIAGNOSIS — E7849 Other hyperlipidemia: Secondary | ICD-10-CM | POA: Diagnosis not present

## 2019-06-14 DIAGNOSIS — Z9189 Other specified personal risk factors, not elsewhere classified: Secondary | ICD-10-CM

## 2019-06-14 NOTE — Progress Notes (Signed)
Chief Complaint:   OBESITY Lauren Briggs is here to discuss her progress with her obesity treatment plan along with follow-up of her obesity related diagnoses. Lauren Briggs is on the Category 3 Plan and states she is following her eating plan approximately 90-95% of the time. Lauren Briggs states she is exercising for 0 minutes 0 times per week.  Today's visit was #: 6 Starting weight: 234 lbs Starting date: 03/02/2019 Today's weight: 223 lbs Today's date: 06/14/2019 Total lbs lost to date: 11 lbs Total lbs lost since last in-office visit: 3 lbs  Interim History: Lauren Briggs has been getting iontophoresis treatment for her left ankle.  She endorses constipation with increased protein.  She will increase her vegetables/protein.  Subjective:   1. Prediabetes Lauren Briggs has a diagnosis of prediabetes based on her elevated HgA1c and was informed this puts her at greater risk of developing diabetes. She continues to work on diet and exercise to decrease her risk of diabetes. She denies nausea or hypoglycemia.  Lab Results  Component Value Date   HGBA1C 6.3 (H) 03/02/2019   Lab Results  Component Value Date   INSULIN 8.6 03/02/2019   2. Other hyperlipidemia Lauren Briggs has hyperlipidemia and has been trying to improve her cholesterol levels with intensive lifestyle modification including a low saturated fat diet, exercise and weight loss. She denies any chest pain, claudication or myalgias.  She is followed by Cardiology.  Lab Results  Component Value Date   ALT 22 03/02/2019   AST 24 03/02/2019   ALKPHOS 60 03/02/2019   BILITOT 0.3 03/02/2019   Lab Results  Component Value Date   CHOL 223 (H) 12/23/2018   HDL 52 12/23/2018   LDLCALC 113 (H) 12/23/2018   TRIG 335 (H) 12/23/2018   CHOLHDL 4.3 12/23/2018   3. Chronic pain of left ankle Lauren Briggs is followed by Podiatry for this.  4. At risk for constipation Lauren Briggs is at increased risk for constipation due to inadequate water intake, changes in diet, and/or use of  medications such as GLP1 agonists. Lauren Briggs denies hard, infrequent stools currently.   Assessment/Plan:   1. Prediabetes Lauren Briggs will continue to work on weight loss, exercise, and decreasing simple carbohydrates to help decrease the risk of diabetes.  - Hemoglobin A1c  2. Other hyperlipidemia Cardiovascular risk and specific lipid/LDL goals reviewed.  We discussed several lifestyle modifications today and Lauren Briggs will continue to work on diet, exercise and weight loss efforts. Orders and follow up as documented in patient record.   Counseling Intensive lifestyle modifications are the first line treatment for this issue. . Dietary changes: Increase soluble fiber. Decrease simple carbohydrates. . Exercise changes: Moderate to vigorous-intensity aerobic activity 150 minutes per week if tolerated. . Lipid-lowering medications: see documented in medical record.  3. Chronic pain of left ankle Followed by Podiatry for this problem. Those encounter notes were reviewed.  4. At risk for constipation Lauren Briggs was given approximately 15 minutes of counseling today regarding prevention of constipation. She was encouraged to increase water and fiber intake.   5. Class 2 severe obesity with serious comorbidity and body mass index (BMI) of 38.0 to 38.9 in adult, unspecified obesity type [facility]) Lauren Briggs is currently in the action stage of change. As such, her goal is to continue with weight loss efforts. She has agreed to the Category 3 Plan.   Exercise goals: As tolerated.  Behavioral modification strategies: increasing lean protein intake, increasing vegetables, increasing water intake and increasing high fiber foods.  Lauren Briggs has agreed to follow-up  with our clinic in 2 weeks. She was informed of the importance of frequent follow-up visits to maximize her success with intensive lifestyle modifications for her multiple health conditions.   Lauren Briggs was informed we would discuss her lab results at her next visit  unless there is a critical issue that needs to be addressed sooner. Lauren Briggs agreed to keep her next visit at the agreed upon time to discuss these results.  Objective:   Blood pressure 119/77, pulse 81, temperature 98.3 F (36.8 C), temperature source Oral, height 5\' 4"  (1.626 m), weight 223 lb (101.2 kg), SpO2 97 %. Body mass index is 38.28 kg/m.  General: Cooperative, alert, well developed, in no acute distress. HEENT: Conjunctivae and lids unremarkable. Cardiovascular: Regular rhythm.  Lungs: Normal work of breathing. Neurologic: No focal deficits.   Lab Results  Component Value Date   CREATININE 0.63 05/26/2019   BUN 20 05/26/2019   NA 139 05/26/2019   K 5.2 05/26/2019   CL 99 05/26/2019   CO2 27 05/26/2019   Lab Results  Component Value Date   ALT 22 03/02/2019   AST 24 03/02/2019   ALKPHOS 60 03/02/2019   BILITOT 0.3 03/02/2019   Lab Results  Component Value Date   HGBA1C 6.3 (H) 03/02/2019   HGBA1C 6.3 (H) 11/09/2018   Lab Results  Component Value Date   INSULIN 8.6 03/02/2019   Lab Results  Component Value Date   TSH 1.420 03/02/2019   Lab Results  Component Value Date   CHOL 223 (H) 12/23/2018   HDL 52 12/23/2018   LDLCALC 113 (H) 12/23/2018   TRIG 335 (H) 12/23/2018   CHOLHDL 4.3 12/23/2018   Lab Results  Component Value Date   WBC 6.1 03/02/2019   HGB 14.2 03/02/2019   HCT 42.7 03/02/2019   MCV 91 03/02/2019   PLT 318 03/02/2019   Lab Results  Component Value Date   IRON 91 03/02/2019   TIBC 480 (H) 03/02/2019   FERRITIN 101 03/02/2019   Attestation Statements:   Reviewed by clinician on day of visit: allergies, medications, problem list, medical history, surgical history, family history, social history, and previous encounter notes.  I, Water quality scientist, CMA, am acting as Location manager for [employer], DO.  I have reviewed the above documentation for accuracy and completeness, and I agree with the above. Briscoe Deutscher, DO

## 2019-06-15 LAB — HEMOGLOBIN A1C
Est. average glucose Bld gHb Est-mCnc: 128 mg/dL
Hgb A1c MFr Bld: 6.1 % — ABNORMAL HIGH (ref 4.8–5.6)

## 2019-06-16 DIAGNOSIS — M7672 Peroneal tendinitis, left leg: Secondary | ICD-10-CM | POA: Diagnosis not present

## 2019-06-16 DIAGNOSIS — M79672 Pain in left foot: Secondary | ICD-10-CM | POA: Diagnosis not present

## 2019-06-24 DIAGNOSIS — M79672 Pain in left foot: Secondary | ICD-10-CM | POA: Diagnosis not present

## 2019-06-24 DIAGNOSIS — M7672 Peroneal tendinitis, left leg: Secondary | ICD-10-CM | POA: Diagnosis not present

## 2019-06-28 DIAGNOSIS — M79672 Pain in left foot: Secondary | ICD-10-CM | POA: Diagnosis not present

## 2019-06-28 DIAGNOSIS — M7672 Peroneal tendinitis, left leg: Secondary | ICD-10-CM | POA: Diagnosis not present

## 2019-06-29 ENCOUNTER — Telehealth: Payer: BC Managed Care – PPO | Admitting: Internal Medicine

## 2019-06-29 DIAGNOSIS — E782 Mixed hyperlipidemia: Secondary | ICD-10-CM | POA: Diagnosis not present

## 2019-06-29 LAB — LIPID PANEL
Chol/HDL Ratio: 3.3 ratio (ref 0.0–4.4)
Cholesterol, Total: 197 mg/dL (ref 100–199)
HDL: 60 mg/dL
LDL Chol Calc (NIH): 88 mg/dL (ref 0–99)
Triglycerides: 300 mg/dL — ABNORMAL HIGH (ref 0–149)
VLDL Cholesterol Cal: 49 mg/dL — ABNORMAL HIGH (ref 5–40)

## 2019-06-30 DIAGNOSIS — M79672 Pain in left foot: Secondary | ICD-10-CM | POA: Diagnosis not present

## 2019-06-30 DIAGNOSIS — M7672 Peroneal tendinitis, left leg: Secondary | ICD-10-CM | POA: Diagnosis not present

## 2019-07-01 ENCOUNTER — Encounter: Payer: Self-pay | Admitting: Internal Medicine

## 2019-07-01 ENCOUNTER — Ambulatory Visit (INDEPENDENT_AMBULATORY_CARE_PROVIDER_SITE_OTHER): Payer: BC Managed Care – PPO | Admitting: Internal Medicine

## 2019-07-01 ENCOUNTER — Other Ambulatory Visit: Payer: Self-pay

## 2019-07-01 DIAGNOSIS — E782 Mixed hyperlipidemia: Secondary | ICD-10-CM | POA: Diagnosis not present

## 2019-07-01 DIAGNOSIS — R931 Abnormal findings on diagnostic imaging of heart and coronary circulation: Secondary | ICD-10-CM | POA: Diagnosis not present

## 2019-07-01 DIAGNOSIS — Z8249 Family history of ischemic heart disease and other diseases of the circulatory system: Secondary | ICD-10-CM | POA: Diagnosis not present

## 2019-07-01 MED ORDER — FENOFIBRATE MICRONIZED 134 MG PO CAPS: 134.0000 mg | ORAL_CAPSULE | Freq: Every day | ORAL | 3 refills | Status: DC

## 2019-07-01 MED ORDER — LISINOPRIL-HYDROCHLOROTHIAZIDE 20-12.5 MG PO TABS: 1.0000 | ORAL_TABLET | Freq: Every day | ORAL | 1 refills | Status: DC

## 2019-07-01 MED ORDER — ICOSAPENT ETHYL 1 G PO CAPS: ORAL_CAPSULE | ORAL | 3 refills | Status: DC

## 2019-07-01 NOTE — Patient Instructions (Signed)
Medication Instructions:  Your physician has recommended you make the following change in your medication:  START fenofibrate 134mg  daily Continue all other current medications  *If you need a refill on your cardiac medications before your next appointment, please call your pharmacy*   Lab Work: FASTING lipid panel in 4 months (prior to next appointment)  If you have labs (blood work) drawn today and your tests are completely normal, you will receive your results only by: Marland Kitchen MyChart Message (if you have MyChart) OR . A paper copy in the mail If you have any lab test that is abnormal or we need to change your treatment, we will call you to review the results.   Testing/Procedures: NONE   Follow-Up: At [facility], you and your health needs are our priority.  As part of our continuing mission to provide you with exceptional heart care, we have created designated Provider Care Teams.  These Care Teams include your primary Cardiologist (physician) and Advanced Practice Providers (APPs -  Physician Assistants and Nurse Practitioners) who all work together to provide you with the care you need, when you need it.  We recommend signing up for the patient portal called "MyChart".  Sign up information is provided on this After Visit Summary.  MyChart is used to connect with patients for Virtual Visits (Telemedicine).  Patients are able to view lab/test results, encounter notes, upcoming appointments, etc.  Non-urgent messages can be sent to your provider as well.   To learn more about what you can do with MyChart, go to [email or web address].    Your next appointment:   4 month(s)  The format for your next appointment:   Either In Person or Virtual  Provider:   Raliegh Ip Mali Hilty, MD   Other Instructions

## 2019-07-01 NOTE — Progress Notes (Signed)
LIPID CLINIC CONSULT NOTE  Chief Complaint:  Manage dyslipidemia  Primary Care Physician: Lauren Rm, NP-C  Primary Cardiologist:  No primary care provider on file.  HPI:  Lauren Briggs is a 64 y.o. female who is being seen today for the evaluation of dyslipidemia at the request of Lauren Briggs, Lauren L, NP-C.  This is a pleasant 64 year old female kindly referred by Dr. Gwenlyn Briggs for evaluation management of dyslipidemia.  She has a history of known coronary artery calcification with a calcium score 53, 81st percentile for age and sex matched control as of December 21, 2018.  Her most recent lipids showed total cholesterol 223, triglycerides 335, HDL 52 and LDL 113.  Her previous lipid profile showed triglycerides of almost 600 and LDL of 164.  Subsequently she has been on Vascepa 2 g twice daily in addition to simvastatin 20 mg nightly.  Overall she is tolerating the medicines well.  In addition she has been followed at the weight management center.  She has started to lose some weight although her BMI still is 40.  07/01/2019  Lauren Briggs is seen today in follow-up.  She has responded with the addition of ezetimibe and her lipids are lower.  Total cholesterol is now 197 down from 223, triglycerides 300 from 335 and LDL of 88 from 113.  Across the board this is a good response however triglycerides still remain elevated.  We discussed possibility of adding a fibrate although there is little evidence for additional cardiovascular risk reduction there is some evidence in select population such as diabetics for possible benefit.  I would recommend we trial her with fenofibrate 134 mg daily.  Repeat lipids in 3 to 4 months.  PMHx:  Past Medical History:  Diagnosis Date  . Allergy   . Arthritis   . Back pain   . Complication of anesthesia    HX Back CHIPPED TOOTH S/P EXTUBATION   . Hyperlipidemia   . Hypertension   . Low back pain radiating to right leg   . Mixed hypercholesterolemia and  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  . Neuromuscular disorder ([place])   . Prediabetes 11/10/2018  . Shortness of breath dyspnea     Past Surgical History:  Procedure Laterality Date  . ABDOMINAL HYSTERECTOMY     PARTIAL  . ANTERIOR LAT LUMBAR FUSION N/A 08/14/2018   Procedure: extreme lateral interbody fusion - Lumbar two-Lumbar three  cortical screws and removal pedicle screws Lumbar -Sacral one;  Surgeon: Kary Kos, MD;  Location: [facility];  Service: Neurosurgery;  Laterality: N/A;  . APPENDECTOMY    . BACK SURGERY     times 4  . CARPAL TUNNEL RELEASE     BIL   . CESAREAN SECTION     PARTIAL HYST  . CLOSED REDUCTION ULNAR SHAFT    . COLONOSCOPY    . FOOT SURGERY     LT FOOT CYST REMOVED   . HARDWARE REMOVAL N/A 08/14/2018   Procedure: Removal pedicle screws Lumbar -Sacral one with cortical screw fixation lumbar two-three;  Surgeon: Kary Kos, MD;  Location: [facility];  Service: Neurosurgery;  Laterality: N/A;  . NASAL SINUS SURGERY     1994   . radial head Right 01/14/2016   at [facility]  . RADIAL HEAD ARTHROPLASTY Right 01/26/2016   Procedure: RIGHT RADIAL HEAD REPLACEMENT ARTHROPLASTY WITH LIGAMENT REPAIRS;  Surgeon: Charlotte Crumb, MD;  Location: [facility];  Service: Orthopedics;  Laterality: Right;  Right elbow raial head replacement with possible ligament  repairs as needed   . UPPER GASTROINTESTINAL ENDOSCOPY      FAMHx:  Family History  Problem Relation Age of Onset  . Dementia Mother   . Heart attack Mother   . Hypertension Mother   . Heart disease Mother   . Hyperlipidemia Mother   . Heart attack Father 32  . Heart disease Father   . Sudden death Father   . Hyperlipidemia Father   . Heart attack Maternal Grandmother 91  . Stroke Maternal Grandmother   . Colon cancer Neg Hx   . Colon polyps Neg Hx   . Esophageal cancer Neg Hx   . Stomach cancer Neg Hx   . Rectal cancer Neg Hx     SOCHx:   reports that she quit smoking about 40 years ago. Her smoking use  included cigarettes. She quit after 3.00 years of use. She quit smokeless tobacco use about 40 years ago. She reports current alcohol use. She reports that she does not use drugs.  ALLERGIES:  Allergies  Allergen Reactions  . Propoxyphene Hcl Palpitations    darvon     ROS: Pertinent items noted in HPI and remainder of comprehensive ROS otherwise negative.  HOME MEDS: Current Outpatient Medications on File Prior to Visit  Medication Sig Dispense Refill  . Acetaminophen (TYLENOL PO) Take by mouth.    . Coenzyme Q10 (COQ-10) 100 MG CAPS otc 30 capsule 0  . DULoxetine (CYMBALTA) 60 MG capsule Take 60 mg by mouth daily.    Marland Kitchen estradiol (ESTRACE) 1 MG tablet Take 1 mg by mouth every morning.     . ezetimibe (ZETIA) 10 MG tablet Take 1 tablet (10 mg total) by mouth daily. 90 tablet 3  . lisinopril-hydrochlorothiazide (ZESTORETIC) 20-12.5 MG tablet TAKE 1 TABLET BY MOUTH DAILY 90 tablet 1  . Multiple Vitamin (MULTIVITAMIN WITH MINERALS) TABS Take 1 tablet by mouth daily after supper. Women's 50 plus    . simvastatin (ZOCOR) 20 MG tablet TAKE 1 TABLET(20 MG) BY MOUTH AT BEDTIME 90 tablet 0  . VASCEPA 1 g capsule TAKE 2 CAPSULES(2 GRAMS) BY MOUTH TWICE DAILY 120 capsule 2   No current facility-administered medications on file prior to visit.    LABS/IMAGING: No results Briggs for this or any previous visit (from the past 48 hour(s)). No results Briggs.  LIPID PANEL:    Component Value Date/Time   CHOL 197 06/29/2019 1019   TRIG 300 (H) 06/29/2019 1019   HDL 60 06/29/2019 1019   CHOLHDL 3.3 06/29/2019 1019   CHOLHDL 4.1 09/01/2014 0933   VLDL 55 (H) 09/01/2014 0933   LDLCALC 88 06/29/2019 1019    WEIGHTS: Wt Readings from Last 3 Encounters:  07/01/19 230 lb (104.3 kg)  06/14/19 223 lb (101.2 kg)  05/24/19 226 lb (102.5 kg)    VITALS: BP 106/72   Pulse 79   Ht 5\' 4"  (1.626 m)   Wt 230 lb (104.3 kg)   SpO2 98%   BMI 39.48 kg/m    EXAM: Deferred  EKG: Deferred  ASSESSMENT: 1. Mixed dyslipidemia, goal LDL less than 70 2. Coronary artery calcification-CAC score 69 3. Morbid obesity 4. Hypertension  PLAN: 1.   Lauren Briggs has had interval reduction in her lipids on ezetimibe however triglycerides remain elevated.  I think there is a dietary component with this and with additional work and weight loss she will likely get that number lower but in the interim we may consider adding a fibrate  Start fenofibrate 134 mg  daily.  Repeat lipids in 3 to 4 months follow-up afterwards.  Pixie Casino, MD, [facility], [place] Director of the Advanced Lipid Disorders &  Cardiovascular Risk Reduction Clinic Diplomate of the American Board of Clinical Lipidology Attending Cardiologist  Direct Dial: [phone]  Fax: [phone]  Website:  [email or web address] 07/01/2019, 11:19 AM

## 2019-07-05 DIAGNOSIS — M7672 Peroneal tendinitis, left leg: Secondary | ICD-10-CM | POA: Diagnosis not present

## 2019-07-05 DIAGNOSIS — M79672 Pain in left foot: Secondary | ICD-10-CM | POA: Diagnosis not present

## 2019-07-06 ENCOUNTER — Other Ambulatory Visit: Payer: Self-pay | Admitting: Family Medicine

## 2019-07-06 DIAGNOSIS — E782 Mixed hyperlipidemia: Secondary | ICD-10-CM

## 2019-07-06 MED ORDER — SIMVASTATIN 20 MG PO TABS: ORAL_TABLET | ORAL | 3 refills | Status: DC

## 2019-07-06 NOTE — Telephone Encounter (Signed)
Sent patient a message on mychart to have cardiology refill this medication per her pcp.

## 2019-07-06 NOTE — Telephone Encounter (Signed)
Is this appropriate? Patient may have new pcp.

## 2019-07-06 NOTE — Telephone Encounter (Signed)
She saw cardiology recently. I recommend having their office refill her cholesterol medication. Thanks.

## 2019-07-07 ENCOUNTER — Other Ambulatory Visit: Payer: Self-pay

## 2019-07-07 ENCOUNTER — Ambulatory Visit (INDEPENDENT_AMBULATORY_CARE_PROVIDER_SITE_OTHER): Payer: BC Managed Care – PPO | Admitting: Family Medicine

## 2019-07-07 ENCOUNTER — Encounter (INDEPENDENT_AMBULATORY_CARE_PROVIDER_SITE_OTHER): Payer: Self-pay | Admitting: Family Medicine

## 2019-07-07 VITALS — BP 123/72 | HR 86 | Temp 98.2°F | Ht 64.0 in | Wt 224.0 lb

## 2019-07-07 DIAGNOSIS — R7303 Prediabetes: Secondary | ICD-10-CM

## 2019-07-07 DIAGNOSIS — M7672 Peroneal tendinitis, left leg: Secondary | ICD-10-CM | POA: Diagnosis not present

## 2019-07-07 DIAGNOSIS — E7849 Other hyperlipidemia: Secondary | ICD-10-CM | POA: Diagnosis not present

## 2019-07-07 DIAGNOSIS — Z9189 Other specified personal risk factors, not elsewhere classified: Secondary | ICD-10-CM | POA: Diagnosis not present

## 2019-07-07 DIAGNOSIS — Z6838 Body mass index (BMI) 38.0-38.9, adult: Secondary | ICD-10-CM

## 2019-07-07 NOTE — Progress Notes (Signed)
Chief Complaint:   OBESITY Lauren Briggs is here to discuss her progress with her obesity treatment plan along with follow-up of her obesity related diagnoses. Lauren Briggs is on the Category 3 Plan and states she is following her eating plan approximately 90% of the time. Lauren Briggs states she is riding a bike (cardio) for 6 miles 3 times per week.  Today's visit was #: 7 Starting weight: 234 lbs Starting date: 03/02/2019 Today's weight: 224 lbs Today's date: 07/07/2019 Total lbs lost to date: 10 lbs Total lbs lost since last in-office visit: 0  Interim History: Lauren Briggs says that brother-in-law is visiting, so she has been doing increased eating out.  She is using a [employer].  She reports that her foot/leg pain is improving.  Subjective:   1. Other hyperlipidemia Lauren Briggs has hyperlipidemia and has been trying to improve her cholesterol levels with intensive lifestyle modification including a low saturated fat diet, exercise and weight loss. She denies any chest pain, claudication or myalgias.  Cardiology notes from 07/01/2019 were reviewed.  Fenofibrate was added by Dr. Debara Pickett.  Lab Results  Component Value Date   ALT 22 03/02/2019   AST 24 03/02/2019   ALKPHOS 60 03/02/2019   BILITOT 0.3 03/02/2019   Lab Results  Component Value Date   CHOL 197 06/29/2019   HDL 60 06/29/2019   LDLCALC 88 06/29/2019   TRIG 300 (H) 06/29/2019   CHOLHDL 3.3 06/29/2019   2. Prediabetes Lauren Briggs has a diagnosis of prediabetes based on her elevated HgA1c and was informed this puts her at greater risk of developing diabetes. She continues to work on diet and exercise to decrease her risk of diabetes. She denies nausea or hypoglycemia.    Lab Results  Component Value Date   HGBA1C 6.1 (H) 06/14/2019   Lab Results  Component Value Date   INSULIN 8.6 03/02/2019   3. Peroneal tendinitis, left This is improving.  4. At risk for heart disease Lauren Briggs is at a higher than average risk for cardiovascular disease due to  obesity.   Assessment/Plan:   1. Other hyperlipidemia Cardiovascular risk and specific lipid/LDL goals reviewed.  We discussed several lifestyle modifications today and Lauren Briggs will continue to work on diet, exercise and weight loss efforts. Orders and follow up as documented in patient record.    Counseling Intensive lifestyle modifications are the first line treatment for this issue. . Dietary changes: Increase soluble fiber. Decrease simple carbohydrates. . Exercise changes: Moderate to vigorous-intensity aerobic activity 150 minutes per week if tolerated. . Lipid-lowering medications: see documented in medical record.  2. Prediabetes Lauren Briggs will continue to work on weight loss, exercise, and decreasing simple carbohydrates to help decrease the risk of diabetes.  A1c is 6.1.  Discussed treatment with GLP-1 RA for lowering and triglycerides.  Lauren Briggs would like to wait.  3. Peroneal tendinitis, left Will continue to monitor.  4. At risk for heart disease Lauren Briggs was given approximately 15 minutes of coronary artery disease prevention counseling today. She is 64 y.o. female and has risk factors for heart disease including obesity. We discussed intensive lifestyle modifications today with an emphasis on specific weight loss instructions and strategies.   Repetitive spaced learning was employed today to elicit superior memory formation and behavioral change.  5. Class 2 severe obesity with serious comorbidity and body mass index (BMI) of 38.0 to 38.9 in adult, unspecified obesity type Lauren Briggs) Lauren Briggs is currently in the action stage of change. As such, her goal is to continue  with weight loss efforts. She has agreed to the Category 3 Plan.   Exercise goals: For substantial health benefits, adults should do at least 150 minutes (2 hours and 30 minutes) a week of moderate-intensity, or 75 minutes (1 hour and 15 minutes) a week of vigorous-intensity aerobic physical activity, or an equivalent combination  of moderate- and vigorous-intensity aerobic activity. Aerobic activity should be performed in episodes of at least 10 minutes, and preferably, it should be spread throughout the week.  Behavioral modification strategies: increasing lean protein intake.  Lauren Briggs has agreed to follow-up with our clinic in 3 weeks. She was informed of the importance of frequent follow-up visits to maximize her success with intensive lifestyle modifications for her multiple health conditions.   Objective:   Blood pressure 123/72, pulse 86, temperature 98.2 F (36.8 C), temperature source Oral, height 5\' 4"  (1.626 m), weight 224 lb (101.6 kg), SpO2 97 %. Body mass index is 38.45 kg/m.  General: Cooperative, alert, well developed, in no acute distress. HEENT: Conjunctivae and lids unremarkable. Cardiovascular: Regular rhythm.  Lungs: Normal work of breathing. Neurologic: No focal deficits.   Lab Results  Component Value Date   CREATININE 0.63 05/26/2019   BUN 20 05/26/2019   NA 139 05/26/2019   K 5.2 05/26/2019   CL 99 05/26/2019   CO2 27 05/26/2019   Lab Results  Component Value Date   ALT 22 03/02/2019   AST 24 03/02/2019   ALKPHOS 60 03/02/2019   BILITOT 0.3 03/02/2019   Lab Results  Component Value Date   HGBA1C 6.1 (H) 06/14/2019   HGBA1C 6.3 (H) 03/02/2019   HGBA1C 6.3 (H) 11/09/2018   Lab Results  Component Value Date   INSULIN 8.6 03/02/2019   Lab Results  Component Value Date   TSH 1.420 03/02/2019   Lab Results  Component Value Date   CHOL 197 06/29/2019   HDL 60 06/29/2019   LDLCALC 88 06/29/2019   TRIG 300 (H) 06/29/2019   CHOLHDL 3.3 06/29/2019   Lab Results  Component Value Date   WBC 6.1 03/02/2019   HGB 14.2 03/02/2019   HCT 42.7 03/02/2019   MCV 91 03/02/2019   PLT 318 03/02/2019   Lab Results  Component Value Date   IRON 91 03/02/2019   TIBC 480 (H) 03/02/2019   FERRITIN 101 03/02/2019   Attestation Statements:   Reviewed by clinician on day of visit:  allergies, medications, problem list, medical history, surgical history, family history, social history, and previous encounter notes.  I, Water quality scientist, CMA, am acting as transcriptionist for Briscoe Deutscher, DO  I have reviewed the above documentation for accuracy and completeness, and I agree with the above. Briscoe Deutscher, DO

## 2019-08-02 ENCOUNTER — Ambulatory Visit (INDEPENDENT_AMBULATORY_CARE_PROVIDER_SITE_OTHER): Payer: BC Managed Care – PPO | Admitting: Family Medicine

## 2019-08-02 ENCOUNTER — Encounter (INDEPENDENT_AMBULATORY_CARE_PROVIDER_SITE_OTHER): Payer: Self-pay | Admitting: Family Medicine

## 2019-08-02 ENCOUNTER — Other Ambulatory Visit: Payer: Self-pay

## 2019-08-02 VITALS — BP 126/79 | HR 73 | Temp 97.9°F | Ht 64.0 in | Wt 226.0 lb

## 2019-08-02 DIAGNOSIS — Z9189 Other specified personal risk factors, not elsewhere classified: Secondary | ICD-10-CM

## 2019-08-02 DIAGNOSIS — E7849 Other hyperlipidemia: Secondary | ICD-10-CM

## 2019-08-02 DIAGNOSIS — R7303 Prediabetes: Secondary | ICD-10-CM

## 2019-08-02 DIAGNOSIS — K5909 Other constipation: Secondary | ICD-10-CM

## 2019-08-02 DIAGNOSIS — Z6838 Body mass index (BMI) 38.0-38.9, adult: Secondary | ICD-10-CM

## 2019-08-02 NOTE — Progress Notes (Signed)
Chief Complaint:   OBESITY Lauren Briggs is here to discuss her progress with her obesity treatment plan along with follow-up of her obesity related diagnoses. Lauren Briggs is on the Category 3 Plan and states she is following her eating plan approximately 85-90% of the time. Lauren Briggs states she is exercising for 0 minutes 0 times per week.  Today's visit was #: 8 Starting weight: 234 lbs Starting date: 03/02/2019 Today's weight: 226 lbs Today's date: 08/02/2019 Total lbs lost to date: 8 Total lbs lost since last in-office visit: 0  Interim History: Lauren Briggs has been to the beach since her last visit.  She says she is having some constipation due to increased protein in her diet.  She is excited to go to [facility] next month to see her favorite band. Today's bioimpedance results indicate that Lauren Briggs has gained 1.5 pounds of water weight since her last visit.  Subjective:   1. Prediabetes Lauren Briggs has a diagnosis of prediabetes based on her elevated HgA1c and was informed this puts her at greater risk of developing diabetes. She continues to work on diet and exercise to decrease her risk of diabetes. She denies nausea or hypoglycemia.  Lab Results  Component Value Date   HGBA1C 6.1 (H) 06/14/2019   Lab Results  Component Value Date   INSULIN 8.6 03/02/2019   2. Other hyperlipidemia Lauren Briggs has hyperlipidemia and has been trying to improve her cholesterol levels with intensive lifestyle modification including a low saturated fat diet, exercise and weight loss. She denies any chest pain, claudication or myalgias.  She is followed by Dr. Debara Pickett.  Lab Results  Component Value Date   ALT 22 03/02/2019   AST 24 03/02/2019   ALKPHOS 60 03/02/2019   BILITOT 0.3 03/02/2019   Lab Results  Component Value Date   CHOL 197 06/29/2019   HDL 60 06/29/2019   LDLCALC 88 06/29/2019   TRIG 300 (H) 06/29/2019   CHOLHDL 3.3 06/29/2019   Assessment/Plan:   1. Prediabetes Lauren Briggs will continue to work on weight loss,  exercise, and decreasing simple carbohydrates to help decrease the risk of diabetes.   2. Other hyperlipidemia Cardiovascular risk and specific lipid/LDL goals reviewed.  We discussed several lifestyle modifications today and Lauren Briggs will continue to work on diet, exercise and weight loss efforts. Orders and follow up as documented in patient record.   Counseling Intensive lifestyle modifications are the first line treatment for this issue. . Dietary changes: Increase soluble fiber. Decrease simple carbohydrates. . Exercise changes: Moderate to vigorous-intensity aerobic activity 150 minutes per week if tolerated. . Lipid-lowering medications: see documented in medical record.  3. Other constipation Lauren Briggs was informed that a decrease in bowel movement frequency is normal while losing weight, but stools should not be hard or painful. Orders and follow up as documented in patient record.   Counseling Getting to Good Bowel Health: Your goal is to have one soft bowel movement each day. Drink at least 8 glasses of water each day. Eat plenty of fiber (goal is over 25 grams each day). It is best to get most of your fiber from dietary sources which includes leafy green vegetables, fresh fruit, and whole grains. You may need to add fiber with the help of OTC fiber supplements. These include Metamucil, Citrucel, and Flaxseed. If you are still having trouble, try adding Miralax or Magnesium Citrate. If all of these changes do not work, Cabin crew.  4. At risk for heart disease Lauren Briggs was given approximately 15  minutes of coronary artery disease prevention counseling today. She is 64 y.o. female and has risk factors for heart disease including obesity. We discussed intensive lifestyle modifications today with an emphasis on specific weight loss instructions and strategies.   Repetitive spaced learning was employed today to elicit superior memory formation and behavioral change.  5. Class 2 severe  obesity with serious comorbidity and body mass index (BMI) of 38.0 to 38.9 in adult, unspecified obesity type Lauren Briggs) Lauren Briggs is currently in the action stage of change. As such, her goal is to continue with weight loss efforts. She has agreed to the Category 3 Plan or keeping a food journal and adhering to recommended goals of 1500 calories, 95 grams of protein, and 25 grams of fiber.   Exercise goals: For substantial health benefits, adults should do at least 150 minutes (2 hours and 30 minutes) a week of moderate-intensity, or 75 minutes (1 hour and 15 minutes) a week of vigorous-intensity aerobic physical activity, or an equivalent combination of moderate- and vigorous-intensity aerobic activity. Aerobic activity should be performed in episodes of at least 10 minutes, and preferably, it should be spread throughout the week.  Behavioral modification strategies: increasing lean protein intake and increasing high fiber foods.  Lauren Briggs has agreed to follow-up with our clinic in 2-3 weeks. She was informed of the importance of frequent follow-up visits to maximize her success with intensive lifestyle modifications for her multiple health conditions.   Objective:   Blood pressure 126/79, pulse 73, temperature 97.9 F (36.6 C), temperature source Oral, height 5\' 4"  (1.626 m), weight 226 lb (102.5 kg), SpO2 98 %. Body mass index is 38.79 kg/m.  General: Cooperative, alert, well developed, in no acute distress. HEENT: Conjunctivae and lids unremarkable. Cardiovascular: Regular rhythm.  Lungs: Normal work of breathing. Neurologic: No focal deficits.   Lab Results  Component Value Date   CREATININE 0.63 05/26/2019   BUN 20 05/26/2019   NA 139 05/26/2019   K 5.2 05/26/2019   CL 99 05/26/2019   CO2 27 05/26/2019   Lab Results  Component Value Date   ALT 22 03/02/2019   AST 24 03/02/2019   ALKPHOS 60 03/02/2019   BILITOT 0.3 03/02/2019   Lab Results  Component Value Date   HGBA1C 6.1 (H)  06/14/2019   HGBA1C 6.3 (H) 03/02/2019   HGBA1C 6.3 (H) 11/09/2018   Lab Results  Component Value Date   INSULIN 8.6 03/02/2019   Lab Results  Component Value Date   TSH 1.420 03/02/2019   Lab Results  Component Value Date   CHOL 197 06/29/2019   HDL 60 06/29/2019   LDLCALC 88 06/29/2019   TRIG 300 (H) 06/29/2019   CHOLHDL 3.3 06/29/2019   Lab Results  Component Value Date   WBC 6.1 03/02/2019   HGB 14.2 03/02/2019   HCT 42.7 03/02/2019   MCV 91 03/02/2019   PLT 318 03/02/2019   Lab Results  Component Value Date   IRON 91 03/02/2019   TIBC 480 (H) 03/02/2019   FERRITIN 101 03/02/2019   Attestation Statements:   Reviewed by clinician on day of visit: allergies, medications, problem list, medical history, surgical history, family history, social history, and previous encounter notes.  I, Water quality scientist, CMA, am acting as transcriptionist for Briscoe Deutscher, DO  I have reviewed the above documentation for accuracy and completeness, and I agree with the above. Briscoe Deutscher, DO

## 2019-08-24 ENCOUNTER — Ambulatory Visit
Admission: EM | Admit: 2019-08-24 | Discharge: 2019-08-24 | Disposition: A | Discharge status: Home / Self Care | Payer: BC Managed Care – PPO | Attending: Physician Assistant | Admitting: Physician Assistant

## 2019-08-24 DIAGNOSIS — R21 Rash and other nonspecific skin eruption: Secondary | ICD-10-CM

## 2019-08-24 DIAGNOSIS — T63441A Toxic effect of venom of bees, accidental (unintentional), initial encounter: Secondary | ICD-10-CM

## 2019-08-24 MED ORDER — PREDNISONE 50 MG PO TABS
50.0000 mg | ORAL_TABLET | Freq: Every day | ORAL | 0 refills | Status: DC
Start: 2019-08-24 — End: 2019-10-06

## 2019-08-24 MED ORDER — TRIAMCINOLONE ACETONIDE 0.025 % EX OINT
1.0000 | TOPICAL_OINTMENT | Freq: Every day | CUTANEOUS | 0 refills | Status: DC
Start: 2019-08-24 — End: 2019-10-06

## 2019-08-24 MED ORDER — DEXAMETHASONE SODIUM PHOSPHATE 10 MG/ML IJ SOLN
10.0000 mg | Freq: Once | INTRAMUSCULAR | Status: AC
  Administered 2019-08-24: 10 mg via INTRAMUSCULAR

## 2019-08-24 NOTE — Discharge Instructions (Signed)
Decadron injection in office today. Triamcinolone at night as directed. Continue benadryl/zyrtec. Ice compress. If symptoms not improving, start prednisone pills. Monitor for signs of infection including spreading redness, increased warmth, pain, fever.

## 2019-08-24 NOTE — ED Provider Notes (Signed)
[facility]    CSN: [id number] Arrival date & time: 08/24/19  1641      History   Chief Complaint Chief Complaint  Patient presents with  . Insect Bite    HPI Lauren Briggs is a 64 y.o. female.   64 year old female comes in for itching and swelling to the left upper arm and right ring finger after being stung by bee 3 days ago.  Has been doing Benadryl, vinegar wraps without relief.  Denies spreading erythema, warmth, fever.      Past Medical History:  Diagnosis Date  . Allergy   . Arthritis   . Back pain   . Complication of anesthesia    HX Back CHIPPED TOOTH S/P EXTUBATION   . Hyperlipidemia   . Hypertension   . Low back pain radiating to right leg   . 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 Neuromuscular disorder ([place])   . Prediabetes 11/10/2018  . Shortness of breath dyspnea     Patient Active Problem List   Diagnosis Date Noted  . Family history of heart disease 12/02/2018  . Prediabetes 11/10/2018  . Essential hypertension 11/09/2018  . Morbid obesity ([place]) 11/09/2018  . Spinal stenosis of lumbar region 08/14/2018  . Pseudoarthrosis of lumbar spine 07/11/2014  . 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 Low back pain radiating to right leg     Past Surgical History:  Procedure Laterality Date  . ABDOMINAL HYSTERECTOMY     PARTIAL  . ANTERIOR LAT LUMBAR FUSION N/A 08/14/2018   Procedure: extreme lateral interbody fusion - Lumbar two-Lumbar three  cortical screws and removal pedicle screws Lumbar -Sacral one;  Surgeon: Kary Kos, MD;  Location: [place];  Service: Neurosurgery;  Laterality: N/A;  . APPENDECTOMY    . BACK SURGERY     times 4  . CARPAL TUNNEL RELEASE     BIL   . CESAREAN SECTION     PARTIAL HYST  . CLOSED REDUCTION ULNAR SHAFT    . COLONOSCOPY    . FOOT SURGERY     LT FOOT CYST REMOVED   . HARDWARE REMOVAL N/A 08/14/2018   Procedure: Removal pedicle screws Lumbar -Sacral  one with cortical screw fixation lumbar two-three;  Surgeon: Kary Kos, MD;  Location: [place];  Service: Neurosurgery;  Laterality: N/A;  . NASAL SINUS SURGERY     1994   . radial head Right 01/14/2016   at [facility]  . RADIAL HEAD ARTHROPLASTY Right 01/26/2016   Procedure: RIGHT RADIAL HEAD REPLACEMENT ARTHROPLASTY WITH LIGAMENT REPAIRS;  Surgeon: Charlotte Crumb, MD;  Location: [place];  Service: Orthopedics;  Laterality: Right;  Right elbow raial head replacement with possible ligament repairs as needed   . UPPER GASTROINTESTINAL ENDOSCOPY      OB History    Gravida  2   Para      Term      Preterm      AB      Living        SAB      TAB      Ectopic      Multiple      Live Births               Home Medications    Prior to Admission medications   Medication Sig Start Date End Date Taking? Authorizing Provider  Acetaminophen (TYLENOL PO) Take by mouth.    [provider]  Coenzyme Q10 (COQ-10) 100 MG CAPS  otc 11/12/18   Henson, Vickie L, NP-C  DULoxetine (CYMBALTA) 60 MG capsule Take 60 mg by mouth daily.    [provider]  estradiol (ESTRACE) 1 MG tablet Take 1 mg by mouth every morning.     [provider]  ezetimibe (ZETIA) 10 MG tablet Take 1 tablet (10 mg total) by mouth daily. 04/16/19 07/15/19  Pixie Casino, MD  fenofibrate micronized (LOFIBRA) 134 MG capsule Take 1 capsule (134 mg total) by mouth daily before breakfast. 07/01/19   Hilty, Nadean Corwin, MD  icosapent Ethyl (VASCEPA) 1 g capsule TAKE 2 CAPSULES(2 GRAMS) BY MOUTH TWICE DAILY 07/01/19   Hilty, Nadean Corwin, MD  lisinopril-hydrochlorothiazide (ZESTORETIC) 20-12.5 MG tablet Take 1 tablet by mouth daily. 07/01/19   Lorretta Harp, MD  Multiple Vitamin (MULTIVITAMIN WITH MINERALS) TABS Take 1 tablet by mouth daily after supper. Women's 50 plus    [provider]  predniSONE (DELTASONE) 50 MG tablet Take 1 tablet (50 mg total) by mouth daily with  breakfast. 08/24/19   Tasia Catchings, Cory Kitt V, PA-C  simvastatin (ZOCOR) 20 MG tablet TAKE 1 TABLET(20 MG) BY MOUTH AT BEDTIME 07/06/19   Hilty, Nadean Corwin, MD  triamcinolone (KENALOG) 0.025 % ointment Apply 1 application topically at bedtime. 08/24/19   Ok Edwards, PA-C    Family History Family History  Problem Relation Age of Onset  . Dementia Mother   . Heart attack Mother   . Hypertension Mother   . Heart disease Mother   . Hyperlipidemia Mother   . Heart attack Father 67  . Heart disease Father   . Sudden death Father   . Hyperlipidemia Father   . Heart attack Maternal Grandmother 91  . Stroke Maternal Grandmother   . Colon cancer Neg Hx   . Colon polyps Neg Hx   . Esophageal cancer Neg Hx   . Stomach cancer Neg Hx   . Rectal cancer Neg Hx     Social History Social History   Tobacco Use  . Smoking status: Former Smoker    Years: 3.00    Types: Cigarettes    Quit date: 1981    Years since quitting: 40.6  . Smokeless tobacco: Former Systems developer    Quit date: 01/22/1979  Vaping Use  . Vaping Use: Never used  Substance Use Topics  . Alcohol use: Yes    Comment: occasional bourbon and ginger ale   . Drug use: No     Allergies   Propoxyphene hcl   Review of Systems Review of Systems   Physical Exam Triage Vital Signs ED Triage Vitals  Enc Vitals Group     BP 08/24/19 1803 131/74     Pulse Rate 08/24/19 1803 92     Resp 08/24/19 1803 16     Temp 08/24/19 1803 98.3 F (36.8 C)     Temp Source 08/24/19 1803 Oral     SpO2 08/24/19 1803 97 %     Weight --      Height --      Head Circumference --      Peak Flow --      Pain Score 08/24/19 1819 3     Pain Loc --      Pain Edu? --      Excl. in [place]? --    No data found.  Updated Vital Signs BP 131/74 (BP Location: Left Arm)   Pulse 92   Temp 98.3 F (36.8 C) (Oral)   Resp 16  SpO2 97%   Physical Exam Constitutional:      General: She is not in acute distress.    Appearance: Normal appearance. She is well-developed.  She is not toxic-appearing or diaphoretic.  HENT:     Head: Normocephalic and atraumatic.  Eyes:     Conjunctiva/sclera: Conjunctivae normal.     Pupils: Pupils are equal, round, and reactive to light.  Pulmonary:     Effort: Pulmonary effort is normal. No respiratory distress.  Musculoskeletal:     Cervical back: Normal range of motion and neck supple.     Comments: Swelling to the right ring finger with maculopapular rash, excoriations. Slight erythema without warmth, tenderness. Decreased flexion due to swelling. NVI.  Skin:    General: Skin is warm and dry.     Comments: Urticarial rash to the left upper arm without erythema, warmth. No tenderness.   Neurological:     Mental Status: She is alert and oriented to person, place, and time.      UC Treatments / Results  Labs (all labs ordered are listed, but only abnormal results are displayed) Labs Reviewed - No data to display  EKG   Radiology No results found.  Procedures Procedures (including critical care time)  Medications Ordered in UC Medications  dexamethasone (DECADRON) injection 10 mg (has no administration in time range)    Initial Impression / Assessment and Plan / UC Course  I have reviewed the triage vital signs and the nursing notes.  Pertinent labs & imaging results that were available during my care of the patient were reviewed by me and considered in my medical decision making (see chart for details).    Decadron injection in office today.  Start triamcinolone ointment, ice compress, continue allergy medicine.  If symptoms not improving, start prednisone as directed.  Return precautions given.  Final Clinical Impressions(s) / UC Diagnoses   Final diagnoses:  Rash and nonspecific skin eruption  Bee sting, accidental or unintentional, initial encounter    ED Prescriptions    Medication Sig Dispense Auth. Provider   predniSONE (DELTASONE) 50 MG tablet Take 1 tablet (50 mg total) by mouth daily  with breakfast. 5 tablet Kalley Nicholl V, PA-C   triamcinolone (KENALOG) 0.025 % ointment Apply 1 application topically at bedtime. 30 g Ok Edwards, PA-C     PDMP not reviewed this encounter.   Ok Edwards, PA-C 08/24/19 [phone]

## 2019-08-24 NOTE — ED Triage Notes (Signed)
Pt states stung by a yellow jacket to lt upper arm and rt ring finger on Sunday. States been taking benadryl and doing vinegar wraps with no relief.

## 2019-08-25 ENCOUNTER — Other Ambulatory Visit: Payer: Self-pay

## 2019-08-25 ENCOUNTER — Ambulatory Visit (INDEPENDENT_AMBULATORY_CARE_PROVIDER_SITE_OTHER): Payer: BC Managed Care – PPO | Admitting: Family Medicine

## 2019-08-25 ENCOUNTER — Encounter (INDEPENDENT_AMBULATORY_CARE_PROVIDER_SITE_OTHER): Payer: Self-pay | Admitting: Family Medicine

## 2019-08-25 VITALS — BP 150/74 | HR 94 | Temp 97.6°F | Ht 64.0 in | Wt 229.0 lb

## 2019-08-25 DIAGNOSIS — K5909 Other constipation: Secondary | ICD-10-CM

## 2019-08-25 DIAGNOSIS — Z9189 Other specified personal risk factors, not elsewhere classified: Secondary | ICD-10-CM

## 2019-08-25 DIAGNOSIS — R030 Elevated blood-pressure reading, without diagnosis of hypertension: Secondary | ICD-10-CM

## 2019-08-25 DIAGNOSIS — T63481A Toxic effect of venom of other arthropod, accidental (unintentional), initial encounter: Secondary | ICD-10-CM

## 2019-08-25 DIAGNOSIS — Z6839 Body mass index (BMI) 39.0-39.9, adult: Secondary | ICD-10-CM

## 2019-08-25 NOTE — Progress Notes (Signed)
Chief Complaint:   OBESITY Lauren Briggs is here to discuss her progress with her obesity treatment plan along with follow-up of her obesity related diagnoses. Lauren Briggs is on keeping a food journal and adhering to recommended goals of 1500 calories and 95 grams of protein and states she is following her eating plan approximately 85-90% of the time. Lauren Briggs states she is exercising for 0 minutes 0 times per week.  Today's visit was #: 9 Starting weight: 234 lbs Starting date: 03/02/2019 Today's weight: 229 lbs Today's date: 08/25/2019 Total lbs lost to date: 5 lbs Total lbs lost since last in-office visit: 0  Interim History: Lauren Briggs says she just got back from the beach.  She had a great time and is happy that she maintained which was her goal. She will be going to [facility] next week to see her favorite band and is not going to focus on the diet plan during that time.  Yesterday, when unloading her car, she was stung by a yellow jacket yesterday and was given steroids.  Subjective:   1. Other constipation Lauren Briggs notes constipation.  She is taking MiraLAX.  2. Accidental insect sting She has been prescribed steroids and says this is improving.  3. Elevated BP without diagnosis of hypertension Lauren Briggs's blood pressure is elevated, however, she is on steroids at this time. Cardiovascular ROS: no chest pain or dyspnea on exertion.  BP Readings from Last 3 Encounters:  08/25/19 (!) 150/74  08/24/19 131/74  08/02/19 126/79   4. At risk for heart disease Lauren Briggs is at a higher than average risk for cardiovascular disease due to metabolic syndrome. Her ASCVD score is elevated.   The 10-year ASCVD risk score Lauren Briggs DC Lauren Briggs., et al., 2013) is: 8.7%   Values used to calculate the score:     Age: 64 years     Sex: Female     Is Non-Hispanic African American: No     Diabetic: No     Tobacco smoker: No     Systolic Blood Pressure: 503 mmHg     Is BP treated: Yes     HDL Cholesterol: 60 mg/dL     Total  Cholesterol: 197 mg/dL  Assessment/Plan:   1. Other constipation Lauren Briggs was informed that a decrease in bowel movement frequency is normal while losing weight, but stools should not be hard or painful. Orders and follow up as documented in patient record.    Counseling Getting to Good Bowel Health: Your goal is to have one soft bowel movement each day. Drink at least 8 glasses of water each day. Eat plenty of fiber (goal is over 25 grams each day). It is best to get most of your fiber from dietary sources which includes leafy green vegetables, fresh fruit, and whole grains. You may need to add fiber with the help of OTC fiber supplements. These include Metamucil, Citrucel, and Flaxseed. If you are still having trouble, try adding Miralax or Magnesium Citrate. If all of these changes do not work, Lauren Briggs.  2. Accidental insect sting Current treatment plan is effective, no change in therapy. We reviewed the side effects of steroids re: weight management.   3. Elevated BP without diagnosis of hypertension Will continue to monitor. Red flags reviewed.  4. At risk for heart disease Lauren Briggs was given approximately 15 minutes of coronary artery disease prevention counseling today. She is 64 y.o. female and has risk factors for heart disease including obesity. Her ASCVD is elevated as above.  We discussed intensive lifestyle modifications today with an emphasis on specific weight loss instructions and strategies.   Repetitive spaced learning was employed today to elicit superior memory formation and behavioral change.  5. Class 2 severe obesity with serious comorbidity and body mass index (BMI) of 39.0 to 39.9 in adult, unspecified obesity type [facility]) Lauren Briggs is currently in the action stage of change. As such, her goal is to continue with weight loss efforts. She has agreed to keeping a food journal and adhering to recommended goals of 1500 calories and 95 grams of protein.   Exercise goals:  For substantial health benefits, adults should do at least 150 minutes (2 hours and 30 minutes) a week of moderate-intensity, or 75 minutes (1 hour and 15 minutes) a week of vigorous-intensity aerobic physical activity, or an equivalent combination of moderate- and vigorous-intensity aerobic activity. Aerobic activity should be performed in episodes of at least 10 minutes, and preferably, it should be spread throughout the week.  Behavioral modification strategies: increasing lean protein intake and increasing high fiber foods.  Maintain 2000 calories per day until back from [place].  Lauren Briggs has agreed to follow-up with our clinic in 3-4 weeks. She was informed of the importance of frequent follow-up visits to maximize her success with intensive lifestyle modifications for her multiple health conditions.   Objective:   Blood pressure (!) 150/74, pulse 94, temperature 97.6 F (36.4 C), temperature source Oral, height 5\' 4"  (1.626 m), weight 229 lb (103.9 kg), SpO2 95 %. Body mass index is 39.31 kg/m.  General: Cooperative, alert, well developed, in no acute distress. HEENT: Conjunctivae and lids unremarkable. Cardiovascular: Regular rhythm.  Lungs: Normal work of breathing. Neurologic: No focal deficits.   Lab Results  Component Value Date   CREATININE 0.63 05/26/2019   BUN 20 05/26/2019   NA 139 05/26/2019   K 5.2 05/26/2019   CL 99 05/26/2019   CO2 27 05/26/2019   Lab Results  Component Value Date   ALT 22 03/02/2019   AST 24 03/02/2019   ALKPHOS 60 03/02/2019   BILITOT 0.3 03/02/2019   Lab Results  Component Value Date   HGBA1C 6.1 (H) 06/14/2019   HGBA1C 6.3 (H) 03/02/2019   HGBA1C 6.3 (H) 11/09/2018   Lab Results  Component Value Date   INSULIN 8.6 03/02/2019   Lab Results  Component Value Date   TSH 1.420 03/02/2019   Lab Results  Component Value Date   CHOL 197 06/29/2019   HDL 60 06/29/2019   LDLCALC 88 06/29/2019   TRIG 300 (H) 06/29/2019   CHOLHDL 3.3  06/29/2019   Lab Results  Component Value Date   WBC 6.1 03/02/2019   HGB 14.2 03/02/2019   HCT 42.7 03/02/2019   MCV 91 03/02/2019   PLT 318 03/02/2019   Lab Results  Component Value Date   IRON 91 03/02/2019   TIBC 480 (H) 03/02/2019   FERRITIN 101 03/02/2019   Attestation Statements:   Reviewed by clinician on day of visit: allergies, medications, problem list, medical history, surgical history, family history, social history, and previous encounter notes.  I, Water quality scientist, CMA, am acting as transcriptionist for Briscoe Deutscher, DO  I have reviewed the above documentation for accuracy and completeness, and I agree with the above. Briscoe Deutscher, DO

## 2019-09-09 DIAGNOSIS — Z6839 Body mass index (BMI) 39.0-39.9, adult: Secondary | ICD-10-CM | POA: Diagnosis not present

## 2019-09-09 DIAGNOSIS — R030 Elevated blood-pressure reading, without diagnosis of hypertension: Secondary | ICD-10-CM | POA: Diagnosis not present

## 2019-09-09 DIAGNOSIS — M5416 Radiculopathy, lumbar region: Secondary | ICD-10-CM | POA: Diagnosis not present

## 2019-09-09 DIAGNOSIS — M5440 Lumbago with sciatica, unspecified side: Secondary | ICD-10-CM | POA: Diagnosis not present

## 2019-09-22 ENCOUNTER — Ambulatory Visit (INDEPENDENT_AMBULATORY_CARE_PROVIDER_SITE_OTHER): Payer: BC Managed Care – PPO | Admitting: Family Medicine

## 2019-10-05 ENCOUNTER — Ambulatory Visit: Payer: BC Managed Care – PPO | Admitting: Internal Medicine

## 2019-10-05 DIAGNOSIS — M47816 Spondylosis without myelopathy or radiculopathy, lumbar region: Secondary | ICD-10-CM | POA: Diagnosis not present

## 2019-10-06 ENCOUNTER — Other Ambulatory Visit: Payer: Self-pay

## 2019-10-06 ENCOUNTER — Ambulatory Visit (INDEPENDENT_AMBULATORY_CARE_PROVIDER_SITE_OTHER): Payer: BC Managed Care – PPO | Admitting: Family Medicine

## 2019-10-06 ENCOUNTER — Encounter (INDEPENDENT_AMBULATORY_CARE_PROVIDER_SITE_OTHER): Payer: Self-pay | Admitting: Family Medicine

## 2019-10-06 VITALS — BP 144/72 | HR 82 | Temp 98.0°F | Ht 64.0 in | Wt 234.0 lb

## 2019-10-06 DIAGNOSIS — F4321 Adjustment disorder with depressed mood: Secondary | ICD-10-CM

## 2019-10-06 DIAGNOSIS — Z6841 Body Mass Index (BMI) 40.0 and over, adult: Secondary | ICD-10-CM

## 2019-10-06 DIAGNOSIS — M545 Low back pain, unspecified: Secondary | ICD-10-CM

## 2019-10-06 DIAGNOSIS — I10 Essential (primary) hypertension: Secondary | ICD-10-CM | POA: Diagnosis not present

## 2019-10-08 ENCOUNTER — Other Ambulatory Visit: Payer: Self-pay | Admitting: Neurosurgery

## 2019-10-08 DIAGNOSIS — M47815 Spondylosis without myelopathy or radiculopathy, thoracolumbar region: Secondary | ICD-10-CM

## 2019-10-10 NOTE — Progress Notes (Signed)
Chief Complaint:   OBESITY Lauren Briggs is here to discuss her progress with her obesity treatment plan along with follow-up of her obesity related diagnoses. Lauren Briggs is on keeping a food journal and adhering to recommended goals of 1500 calories and 95 grams of protein and states she is following her eating plan approximately 0% of the time. Lauren Briggs states she is exercising for 0 minutes 0 times per week.  Today's visit was #: 10 Starting weight: 234 lbs Starting date: 03/02/2019 Today's weight: 234 lbs Today's date: 10/06/2019 Total lbs lost to date: 0 Total lbs lost since last in-office visit: 0  Interim History: Lauren Briggs says she has had a very tough few weeks.  Her favorite band canceled their show at the last minute.  Her mother-in-law passed away as well.  She reports acute/chronic LBP.  Assessment/Plan:   1. Low back pain Acute on chronic.  Followed by Orthopedics.  Needed prednisone burst recently.  MRI has been scheduled. Will continue to monitor symptoms as they relate to her weight loss journey. This issue directly impacts care plan for optimization of BMI and metabolic health as it impacts the patient's ability to make lifestyle changes.  2. Grief She lost her mother-in-law last week.  Subdural hematoma - Hospice.  She has been helping her father-in-law. We reviewed the importance of hydration, regular exercise for stress reduction, and restorative sleep.   3. Essential hypertension Blood pressure is elevated today due to pain.  Thy is working on healthy weight loss and exercise to improve blood pressure control. Plan: Monitor home BP. Diet: Avoid buying foods that are: processed, frozen, or prepackaged to avoid excess salt. Recheck BP at next visit. The patient understands monitoring parameters and red flags.   BP Readings from Last 3 Encounters:  10/06/19 (!) 144/72  08/25/19 (!) 150/74  08/24/19 131/74   4. Class 3 severe obesity with serious comorbidity and body mass index  (BMI) of 40.0 to 44.9 in adult, unspecified obesity type [facility])  Lauren Briggs is currently in the action stage of change. As such, her goal is to continue with weight loss efforts. She has agreed to keeping a food journal and adhering to recommended goals of 1500 calories and 95 grams of protein.   Exercise goals: All adults should avoid inactivity. Some physical activity is better than none, and adults who participate in any amount of physical activity gain some health benefits.  Behavioral modification strategies: increasing lean protein intake, increasing water intake and emotional eating strategies.  Lauren Briggs has agreed to follow-up with our clinic in 2-3 weeks. She was informed of the importance of frequent follow-up visits to maximize her success with intensive lifestyle modifications for her multiple health conditions.   Objective:   Blood pressure (!) 144/72, pulse 82, temperature 98 F (36.7 C), temperature source Oral, height 5\' 4"  (1.626 m), weight 234 lb (106.1 kg), SpO2 96 %. Body mass index is 40.17 kg/m.  General: Cooperative, alert, well developed, in no acute distress. HEENT: Conjunctivae and lids unremarkable. Cardiovascular: Regular rhythm.  Lungs: Normal work of breathing. Neurologic: No focal deficits.   Lab Results  Component Value Date   CREATININE 0.63 05/26/2019   BUN 20 05/26/2019   NA 139 05/26/2019   K 5.2 05/26/2019   CL 99 05/26/2019   CO2 27 05/26/2019   Lab Results  Component Value Date   ALT 22 03/02/2019   AST 24 03/02/2019   ALKPHOS 60 03/02/2019   BILITOT 0.3 03/02/2019   Lab  Results  Component Value Date   HGBA1C 6.1 (H) 06/14/2019   HGBA1C 6.3 (H) 03/02/2019   HGBA1C 6.3 (H) 11/09/2018   Lab Results  Component Value Date   INSULIN 8.6 03/02/2019   Lab Results  Component Value Date   TSH 1.420 03/02/2019   Lab Results  Component Value Date   CHOL 197 06/29/2019   HDL 60 06/29/2019   LDLCALC 88 06/29/2019   TRIG 300 (H) 06/29/2019    CHOLHDL 3.3 06/29/2019   Lab Results  Component Value Date   WBC 6.1 03/02/2019   HGB 14.2 03/02/2019   HCT 42.7 03/02/2019   MCV 91 03/02/2019   PLT 318 03/02/2019   Lab Results  Component Value Date   IRON 91 03/02/2019   TIBC 480 (H) 03/02/2019   FERRITIN 101 03/02/2019   Attestation Statements:   Reviewed by clinician on day of visit: allergies, medications, problem list, medical history, surgical history, family history, social history, and previous encounter notes.  Time spent on visit including pre-visit chart review and post-visit care and charting was 30 minutes.   I, Water quality scientist, CMA, am acting as transcriptionist for Briscoe Deutscher, DO  I have reviewed the above documentation for accuracy and completeness, and I agree with the above. Briscoe Deutscher, DO

## 2019-10-26 DIAGNOSIS — M47816 Spondylosis without myelopathy or radiculopathy, lumbar region: Secondary | ICD-10-CM | POA: Diagnosis not present

## 2019-10-28 ENCOUNTER — Ambulatory Visit (INDEPENDENT_AMBULATORY_CARE_PROVIDER_SITE_OTHER): Payer: BC Managed Care – PPO | Admitting: Family Medicine

## 2019-10-28 ENCOUNTER — Other Ambulatory Visit: Payer: Self-pay

## 2019-10-28 ENCOUNTER — Encounter (INDEPENDENT_AMBULATORY_CARE_PROVIDER_SITE_OTHER): Payer: Self-pay | Admitting: Family Medicine

## 2019-10-28 VITALS — BP 132/77 | HR 80 | Temp 98.0°F | Ht 64.0 in | Wt 232.0 lb

## 2019-10-28 DIAGNOSIS — I10 Essential (primary) hypertension: Secondary | ICD-10-CM

## 2019-10-28 DIAGNOSIS — E7849 Other hyperlipidemia: Secondary | ICD-10-CM

## 2019-10-28 DIAGNOSIS — M5431 Sciatica, right side: Secondary | ICD-10-CM | POA: Diagnosis not present

## 2019-10-28 DIAGNOSIS — Z6839 Body mass index (BMI) 39.0-39.9, adult: Secondary | ICD-10-CM

## 2019-10-30 ENCOUNTER — Ambulatory Visit
Admission: RE | Admit: 2019-10-30 | Discharge: 2019-10-30 | Disposition: A | Discharge status: Home / Self Care | Payer: BC Managed Care – PPO | Source: Ambulatory Visit | Attending: Neurosurgery | Admitting: Neurosurgery

## 2019-10-30 DIAGNOSIS — M47815 Spondylosis without myelopathy or radiculopathy, thoracolumbar region: Secondary | ICD-10-CM

## 2019-10-30 DIAGNOSIS — M5136 Other intervertebral disc degeneration, lumbar region: Secondary | ICD-10-CM | POA: Diagnosis not present

## 2019-10-30 MED ORDER — GADOBENATE DIMEGLUMINE 529 MG/ML IV SOLN
20.0000 mL | Freq: Once | INTRAVENOUS | Status: AC | PRN
  Administered 2019-10-30: 20 mL via INTRAVENOUS

## 2019-10-31 ENCOUNTER — Telehealth: Payer: Self-pay | Admitting: Family Medicine

## 2019-10-31 NOTE — Telephone Encounter (Signed)
P.A. VASCEPA  °

## 2019-11-01 NOTE — Progress Notes (Signed)
Chief Complaint:   OBESITY Lauren Briggs is here to discuss her progress with her obesity treatment plan along with follow-up of her obesity related diagnoses. Lauren Briggs is on the Category 3 Plan and states she is following her eating plan approximately 85-90% of the time. Lauren Briggs states she is exercising for 0 minutes 0 times per week.  Today's visit was #: 11 Starting weight: 234 lbs Starting date: 03/02/2019 Today's weight: 232 lbs Today's date: 10/28/2019 Total lbs lost to date: 2 lbs Total lbs lost since last in-office visit: 2 lbs  Interim History: Lauren Briggs has an MRI scheduled for Saturday to evaluate her ongoing back pain. It continues to be severe, inhibiting movement. She has a tripped planned -  [place] on November 13. She hopes that the results will not means surgical intervention prior to the trip.  Assessment/Plan:   1. Right sided sciatica Lauren Briggs has an MRI scheduled for tomorrow.  Will continue to monitor symptoms as they relate to her weight loss journey. This issue directly impacts care plan for optimization of BMI and metabolic health as it impacts the patient's ability to make lifestyle changes.  2. Other hyperlipidemia Followed by Dr. Debara Pickett, Cardiology. LDL is at goal on current statin dose, patient has no side effects from medication and LFTS are normal. No changes today.  She is taking Zetia, simvastatin, Vascepa, and fenofibrate.  Lab Results  Component Value Date   ALT 22 03/02/2019   AST 24 03/02/2019   ALKPHOS 60 03/02/2019   BILITOT 0.3 03/02/2019   Lab Results  Component Value Date   CHOL 197 06/29/2019   HDL 60 06/29/2019   LDLCALC 88 06/29/2019   TRIG 300 (H) 06/29/2019   CHOLHDL 3.3 06/29/2019   3. Essential hypertension At goal. Medications: Zestoretic 20.12.5 mg daily. We will monitor for hypotension with continued weight loss. She will continue to focus on protein-rich, low simple carbohydrate foods. We reviewed the importance of hydration, regular exercise  for stress reduction, and restorative sleep.   BP Readings from Last 3 Encounters:  10/28/19 132/77  10/06/19 (!) 144/72  08/25/19 (!) 150/74   Lab Results  Component Value Date   CREATININE 0.63 05/26/2019   4. Class 2 severe obesity with serious comorbidity and body mass index (BMI) of 39.0 to 39.9 in adult, unspecified obesity type Lauren Briggs)  Lauren Briggs is currently in the action stage of change. As such, her goal is to continue with weight loss efforts. She has agreed to the Category 3 Plan.   Exercise goals: No exercise has been prescribed at this time.  Behavioral modification strategies: increasing lean protein intake, decreasing simple carbohydrates, increasing vegetables and increasing water intake.  Lauren Briggs has agreed to follow-up with our clinic in 3-4 weeks. She was informed of the importance of frequent follow-up visits to maximize her success with intensive lifestyle modifications for her multiple health conditions.   Objective:   Blood pressure 132/77, pulse 80, temperature 98 F (36.7 C), temperature source Oral, height 5\' 4"  (1.626 m), weight 232 lb (105.2 kg), SpO2 99 %. Body mass index is 39.82 kg/m.  General: Cooperative, alert, well developed, in no acute distress. HEENT: Conjunctivae and lids unremarkable. Cardiovascular: Regular rhythm.  Lungs: Normal work of breathing. Neurologic: No focal deficits.   Lab Results  Component Value Date   CREATININE 0.63 05/26/2019   BUN 20 05/26/2019   NA 139 05/26/2019   K 5.2 05/26/2019   CL 99 05/26/2019   CO2 27 05/26/2019   Lab  Results  Component Value Date   ALT 22 03/02/2019   AST 24 03/02/2019   ALKPHOS 60 03/02/2019   BILITOT 0.3 03/02/2019   Lab Results  Component Value Date   HGBA1C 6.1 (H) 06/14/2019   HGBA1C 6.3 (H) 03/02/2019   HGBA1C 6.3 (H) 11/09/2018   Lab Results  Component Value Date   INSULIN 8.6 03/02/2019   Lab Results  Component Value Date   TSH 1.420 03/02/2019   Lab Results    Component Value Date   CHOL 197 06/29/2019   HDL 60 06/29/2019   LDLCALC 88 06/29/2019   TRIG 300 (H) 06/29/2019   CHOLHDL 3.3 06/29/2019   Lab Results  Component Value Date   WBC 6.1 03/02/2019   HGB 14.2 03/02/2019   HCT 42.7 03/02/2019   MCV 91 03/02/2019   PLT 318 03/02/2019   Lab Results  Component Value Date   IRON 91 03/02/2019   TIBC 480 (H) 03/02/2019   FERRITIN 101 03/02/2019   Attestation Statements:   Reviewed by clinician on day of visit: allergies, medications, problem list, medical history, surgical history, family history, social history, and previous encounter notes.  Time spent on visit including pre-visit chart review and post-visit care and charting was 30 minutes.   I, Water quality scientist, CMA, am acting as transcriptionist for Briscoe Deutscher, DO  I have reviewed the above documentation for accuracy and completeness, and I agree with the above. Briscoe Deutscher, DO

## 2019-11-02 ENCOUNTER — Other Ambulatory Visit: Payer: Self-pay | Admitting: Neurosurgery

## 2019-11-02 DIAGNOSIS — M47816 Spondylosis without myelopathy or radiculopathy, lumbar region: Secondary | ICD-10-CM

## 2019-11-04 DIAGNOSIS — N951 Menopausal and female climacteric states: Secondary | ICD-10-CM | POA: Diagnosis not present

## 2019-11-04 DIAGNOSIS — N3281 Overactive bladder: Secondary | ICD-10-CM | POA: Diagnosis not present

## 2019-11-04 DIAGNOSIS — R3915 Urgency of urination: Secondary | ICD-10-CM | POA: Diagnosis not present

## 2019-11-04 DIAGNOSIS — R3129 Other microscopic hematuria: Secondary | ICD-10-CM | POA: Diagnosis not present

## 2019-11-04 DIAGNOSIS — L900 Lichen sclerosus et atrophicus: Secondary | ICD-10-CM | POA: Diagnosis not present

## 2019-11-04 DIAGNOSIS — Z01419 Encounter for gynecological examination (general) (routine) without abnormal findings: Secondary | ICD-10-CM | POA: Diagnosis not present

## 2019-11-05 ENCOUNTER — Encounter: Payer: Self-pay | Admitting: Cardiovascular Disease

## 2019-11-05 ENCOUNTER — Ambulatory Visit (INDEPENDENT_AMBULATORY_CARE_PROVIDER_SITE_OTHER): Payer: BC Managed Care – PPO | Admitting: Cardiovascular Disease

## 2019-11-05 ENCOUNTER — Other Ambulatory Visit: Payer: Self-pay | Admitting: Neurosurgery

## 2019-11-05 ENCOUNTER — Other Ambulatory Visit: Payer: Self-pay

## 2019-11-05 VITALS — BP 132/78 | HR 84 | Ht 64.0 in | Wt 236.0 lb

## 2019-11-05 DIAGNOSIS — E782 Mixed hyperlipidemia: Secondary | ICD-10-CM | POA: Diagnosis not present

## 2019-11-05 DIAGNOSIS — I10 Essential (primary) hypertension: Secondary | ICD-10-CM

## 2019-11-05 NOTE — Assessment & Plan Note (Addendum)
History of mixed hyperlipidemia on Zetia, fenofibrate, Vascepa and simvastatin.  Her lipid panel markedly improved from December when her total cholesterol is 223 and her triglyceride level is 502 June when her total cholesterol came down to 197 and her triglyceride level came down to 300 with an LDL of 88.

## 2019-11-05 NOTE — Patient Instructions (Addendum)
Medication Instructions:  No changes *If you need a refill on your cardiac medications before your next appointment, please call your pharmacy*   Lab Work: None ordered If you have labs (blood work) drawn today and your tests are completely normal, you will receive your results only by: Marland Kitchen MyChart Message (if you have MyChart) OR . A paper copy in the mail If you have any lab test that is abnormal or we need to change your treatment, we will call you to review the results.   Testing/Procedures: None ordered   Follow-Up: At [facility], you and your health needs are our priority.  As part of our continuing mission to provide you with exceptional heart care, we have created designated Provider Care Teams.  These Care Teams include your primary Cardiologist (physician) and Advanced Practice Providers (APPs -  Physician Assistants and Nurse Practitioners) who all work together to provide you with the care you need, when you need it.  We recommend signing up for the patient portal called "MyChart".  Sign up information is provided on this After Visit Summary.  MyChart is used to connect with patients for Virtual Visits (Telemedicine).  Patients are able to view lab/test results, encounter notes, upcoming appointments, etc.  Non-urgent messages can be sent to your provider as well.   To learn more about what you can do with MyChart, go to [email or web address].    Your next appointment:   12 month(s)  The format for your next appointment:   In Person  Provider:   You may see Dr. Gwenlyn Found or one of the following Advanced Practice Providers on your designated Care Team:    Kerin Ransom, PA-C  [place], [place]  Coletta Memos, [place]  Per Dr. Gwenlyn Found you will be a low risk for surgery.

## 2019-11-05 NOTE — Progress Notes (Signed)
11/05/2019 Lauren Briggs   Apr 06, 1955  [id number]  Primary Physician Lauren Rm, NP-C Primary Cardiologist: Lauren Harp MD Lauren Briggs, [place]  HPI:  Lauren Briggs is a 64 y.o.  morbidly overweight married Caucasian female mother of 2 children, grandmother of 3 grandchildren referred by her PCP, Lauren Hook, NP and for risk factor modification.  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 Risk factors include treated hypertension, hyperlipidemia and family history with a father who died of a myocardial infarction. She has never had a heart attack or stroke. She denies chest pain or shortness of breath. Her major medical issues have been spinal stenosis treated surgically by Lauren Briggs in the past on 5 occasions.  She is scheduled for yet another back surgery by Lauren Briggs on 12/1. She is actively trying to lose weight and is seeing a nutritionist.  Since I saw her 8 months ago she has seen our pharmacist for blood pressure management and Lauren Briggs in the lipid clinic for high cholesterol/triglycerides all of which have improved on pharmacologic therapy.  She is trying to lose weight as well.  She denies chest pain or shortness of breath.  Her major limitation/complaint revolves around her back.   Current Meds  Medication Sig  . Acetaminophen (TYLENOL PO) Take by mouth.  . Coenzyme Q10 (COQ-10) 100 MG CAPS otc  . DULoxetine (CYMBALTA) 60 MG capsule Take 60 mg by mouth daily.  Marland Kitchen estradiol (ESTRACE) 1 MG tablet Take 1 mg by mouth every morning.   . ezetimibe (ZETIA) 10 MG tablet Take 1 tablet (10 mg total) by mouth daily.  . fenofibrate micronized (LOFIBRA) 134 MG capsule Take 1 capsule (134 mg total) by mouth daily before breakfast.  . HYDROcodone-acetaminophen (NORCO) 10-325 MG tablet Take 1 tablet by mouth every 6 (six) hours  as needed.  Marland Kitchen icosapent Ethyl (VASCEPA) 1 g capsule TAKE 2 CAPSULES(2 GRAMS) BY MOUTH TWICE DAILY  . lisinopril-hydrochlorothiazide (ZESTORETIC) 20-12.5 MG tablet Take 1 tablet by mouth daily.  . Multiple Vitamin (MULTIVITAMIN WITH MINERALS) TABS Take 1 tablet by mouth daily after supper. Women's 50 plus  . simvastatin (ZOCOR) 20 MG tablet TAKE 1 TABLET(20 MG) BY MOUTH AT BEDTIME  . [DISCONTINUED] methocarbamol (ROBAXIN) 750 MG tablet Take 750 mg by mouth every 4 (four) hours.     Allergies  Allergen Reactions  . Propoxyphene Hcl Palpitations    darvon     Social History   Socioeconomic History  . Marital status: Married    Spouse name: Lauren Briggs  . Number of children: Not on file  . Years of education: Not on file  . Highest education level: Not on file  Occupational History  . Occupation: retired Radio broadcast assistant  Tobacco Use  . Smoking status: Former Smoker    Years: 3.00    Types: Cigarettes    Quit date: 1981    Years since quitting: 40.8  . Smokeless tobacco: Former Systems developer    Quit date: 01/22/1979  Vaping Use  . Vaping Use: Never used  Substance and Sexual Activity  . Alcohol use: Yes    Comment: occasional bourbon and ginger ale   . Drug use: No  . Sexual activity: Not on file  Other Topics Concern  . Not on file  Social History Narrative  . Not on  file   Social Determinants of Health   Financial Resource Strain:   . Difficulty of Paying Living Expenses: Not on file  Food Insecurity:   . Worried About Charity fundraiser in the Last Year: Not on file  . Ran Out of Food in the Last Year: Not on file  Transportation Needs:   . Lack of Transportation (Medical): Not on file  . Lack of Transportation (Non-Medical): Not on file  Physical Activity:   . Days of Exercise per Week: Not on file  . Minutes of Exercise per Session: Not on file  Stress:   . Feeling of Stress : Not on file  Social Connections:   . Frequency of Communication with Friends and Family: Not on file    . Frequency of Social Gatherings with Friends and Family: Not on file  . Attends Religious Services: Not on file  . Active Member of Clubs or Organizations: Not on file  . Attends Archivist Meetings: Not on file  . Marital Status: Not on file  Intimate Partner Violence:   . Fear of Current or Ex-Partner: Not on file  . Emotionally Abused: Not on file  . Physically Abused: Not on file  . Sexually Abused: Not on file     Review of Systems: General: negative for chills, fever, night sweats or weight changes.  Cardiovascular: negative for chest pain, dyspnea on exertion, edema, orthopnea, palpitations, paroxysmal nocturnal dyspnea or shortness of breath Dermatological: negative for rash Respiratory: negative for cough or wheezing Urologic: negative for hematuria Abdominal: negative for nausea, vomiting, diarrhea, bright red blood per rectum, melena, or hematemesis Neurologic: negative for visual changes, syncope, or dizziness All other systems reviewed and are otherwise negative except as noted above.    Blood pressure 132/78, pulse 84, height 5\' 4"  (1.626 m), weight 236 lb (107 kg).  General appearance: alert and no distress Neck: no adenopathy, no carotid bruit, no JVD, supple, symmetrical, trachea midline and thyroid not enlarged, symmetric, no tenderness/mass/nodules Lungs: clear to auscultation bilaterally Heart: regular rate and rhythm, S1, S2 normal, no murmur, click, rub or gallop Extremities: extremities normal, atraumatic, no cyanosis or edema Pulses: 2+ and symmetric Skin: Skin color, texture, turgor normal. No rashes or lesions Neurologic: Alert and oriented X 3, normal strength and tone. Normal symmetric reflexes. Normal coordination and gait  EKG sinus rhythm at 84 with poor R wave progression.  I personally reviewed this EKG.  ASSESSMENT AND PLAN:   Mixed hypercholesterolemia and hypertriglyceridemia History of mixed hyperlipidemia on Zetia, fenofibrate,  Vascepa and simvastatin.  Her lipid panel markedly improved from December when her total cholesterol is 223 and her triglyceride level is 502 June when her total cholesterol came down to 197 and her triglyceride level came down to 300 with an LDL of 88.  Essential hypertension History of essential hypertension blood pressure measured today 132/78.  She is on lisinopril and hydrochlorothiazide.  Morbid obesity ([place]) History of morbid obesity being seen at the diet and weight loss center.      Lauren Harp MD FACP,FACC,FAHA, [facility] 11/05/2019 10:05 AM

## 2019-11-05 NOTE — Assessment & Plan Note (Signed)
History of morbid obesity being seen at the diet and weight loss center.

## 2019-11-05 NOTE — Assessment & Plan Note (Signed)
History of essential hypertension blood pressure measured today 132/78.  She is on lisinopril and hydrochlorothiazide.

## 2019-11-05 NOTE — Telephone Encounter (Signed)
P.A. approved til 10/29/20, faxed pharmacy & pt informed

## 2019-11-08 ENCOUNTER — Telehealth: Payer: Self-pay

## 2019-11-08 DIAGNOSIS — Z1231 Encounter for screening mammogram for malignant neoplasm of breast: Secondary | ICD-10-CM | POA: Diagnosis not present

## 2019-11-10 NOTE — Telephone Encounter (Signed)
error 

## 2019-11-12 ENCOUNTER — Other Ambulatory Visit: Payer: Self-pay

## 2019-11-12 ENCOUNTER — Ambulatory Visit
Admission: RE | Admit: 2019-11-12 | Discharge: 2019-11-12 | Disposition: A | Discharge status: Home / Self Care | Payer: BC Managed Care – PPO | Source: Ambulatory Visit | Attending: Neurosurgery | Admitting: Neurosurgery

## 2019-11-12 DIAGNOSIS — M47816 Spondylosis without myelopathy or radiculopathy, lumbar region: Secondary | ICD-10-CM

## 2019-11-12 DIAGNOSIS — M47817 Spondylosis without myelopathy or radiculopathy, lumbosacral region: Secondary | ICD-10-CM | POA: Diagnosis not present

## 2019-11-12 MED ORDER — METHYLPREDNISOLONE ACETATE 40 MG/ML INJ SUSP (RADIOLOG
120.0000 mg | Freq: Once | INTRAMUSCULAR | Status: AC
  Administered 2019-11-12: 120 mg via EPIDURAL

## 2019-11-12 MED ORDER — IOPAMIDOL (ISOVUE-M 200) INJECTION 41%
1.0000 mL | Freq: Once | INTRAMUSCULAR | Status: AC
  Administered 2019-11-12: 1 mL via EPIDURAL

## 2019-11-12 NOTE — Discharge Instructions (Signed)

## 2019-11-22 ENCOUNTER — Encounter: Payer: Self-pay | Admitting: Internal Medicine

## 2019-11-22 ENCOUNTER — Ambulatory Visit (INDEPENDENT_AMBULATORY_CARE_PROVIDER_SITE_OTHER): Payer: BC Managed Care – PPO | Admitting: Internal Medicine

## 2019-11-22 ENCOUNTER — Other Ambulatory Visit: Payer: Self-pay

## 2019-11-22 VITALS — BP 140/83 | HR 78 | Temp 97.3°F | Ht 63.5 in | Wt 234.6 lb

## 2019-11-22 DIAGNOSIS — R931 Abnormal findings on diagnostic imaging of heart and coronary circulation: Secondary | ICD-10-CM

## 2019-11-22 DIAGNOSIS — E782 Mixed hyperlipidemia: Secondary | ICD-10-CM

## 2019-11-22 NOTE — Patient Instructions (Signed)
Medication Instructions:  Your physician recommends that you continue on your current medications as directed. Please refer to the Current Medication list given to you today.  *If you need a refill on your cardiac medications before your next appointment, please call your pharmacy*   Lab Work: FASTING lab work to check cholesterol   If you have labs (blood work) drawn today and your tests are completely normal, you will receive your results only by: Marland Kitchen MyChart Message (if you have MyChart) OR . A paper copy in the mail If you have any lab test that is abnormal or we need to change your treatment, we will call you to review the results.   Testing/Procedures: NONE   Follow-Up: At [facility] you and your health needs are our priority.  As part of our continuing mission to provide you with exceptional heart care, we have created designated Provider Care Teams.  These Care Teams include your primary Cardiologist (physician) and Advanced Practice Providers (APPs -  Physician Assistants and Nurse Practitioners) who all work together to provide you with the care you need, when you need it.  We recommend signing up for the patient portal called "MyChart".  Sign up information is provided on this After Visit Summary.  MyChart is used to connect with patients for Virtual Visits (Telemedicine).  Patients are able to view lab/test results, encounter notes, upcoming appointments, etc.  Non-urgent messages can be sent to your provider as well.   To learn more about what you can do with MyChart, go to [email or web address].    Your next appointment:   6 month(s) - lipid clinic  The format for your next appointment:   In Person  Provider:   K. Mali Hilty, MD   Other Instructions

## 2019-11-22 NOTE — Progress Notes (Signed)
LIPID CLINIC CONSULT NOTE  Chief Complaint:  Manage dyslipidemia  Primary Care Physician: Girtha Rm, NP-C  Primary Cardiologist:  No primary care provider on file.  HPI:  Lauren Briggs is a 64 y.o. female who is being seen today for the evaluation of dyslipidemia at the request of Lauren Briggs, Vickie L, NP-C.  This is a pleasant 64 year old female kindly referred by Dr. Gwenlyn Briggs for evaluation management of dyslipidemia.  She has a history of known coronary artery calcification with a calcium score 21, 81st percentile for age and sex matched control as of December 21, 2018.  Her most recent lipids showed total cholesterol 223, triglycerides 335, HDL 52 and LDL 113.  Her previous lipid profile showed triglycerides of almost 600 and LDL of 164.  Subsequently she has been on Vascepa 2 g twice daily in addition to simvastatin 20 mg nightly.  Overall she is tolerating the medicines well.  In addition she has been followed at the weight management center.  She has started to lose some weight although her BMI still is 40.  07/01/2019  Lauren Briggs is seen today in follow-up.  She has responded with the addition of ezetimibe and her lipids are lower.  Total cholesterol is now 197 down from 223, triglycerides 300 from 335 and LDL of 88 from 113.  Across the board this is a good response however triglycerides still remain elevated.  We discussed possibility of adding a fibrate although there is little evidence for additional cardiovascular risk reduction there is some evidence in select population such as diabetics for possible benefit.  I would recommend we trial her with fenofibrate 134 mg daily.  Repeat lipids in 3 to 4 months.  11/22/2019  Lauren Briggs is seen today in follow-up.  She seems to be tolerating the addition of fenofibrate to her lipids.  Unfortunately we have not had her lipids reassessed.  I think this was a communication issue.  We will need to go ahead and recheck fasting lipids.  She said  she can get it Briggs this week as she is getting ready to head out of town to [place].  PMHx:  Past Medical History:  Diagnosis Date  . Allergy   . Arthritis   . Back pain   . Complication of anesthesia    HX Back CHIPPED TOOTH S/P EXTUBATION   . Hyperlipidemia   . Hypertension   . Low back pain radiating to right leg   . 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 Neuromuscular disorder ([place])   . Prediabetes 11/10/2018  . Shortness of breath dyspnea     Past Surgical History:  Procedure Laterality Date  . ABDOMINAL HYSTERECTOMY     PARTIAL  . ANTERIOR LAT LUMBAR FUSION N/A 08/14/2018   Procedure: extreme lateral interbody fusion - Lumbar two-Lumbar three  cortical screws and removal pedicle screws Lumbar -Sacral one;  Surgeon: Kary Kos, MD;  Location: [facility];  Service: Neurosurgery;  Laterality: N/A;  . APPENDECTOMY    . BACK SURGERY     times 4  . CARPAL TUNNEL RELEASE     BIL   . CESAREAN SECTION     PARTIAL HYST  . CLOSED REDUCTION ULNAR SHAFT    . COLONOSCOPY    . FOOT SURGERY     LT FOOT CYST REMOVED   . HARDWARE REMOVAL N/A 08/14/2018   Procedure: Removal pedicle screws Lumbar -Sacral one with cortical screw fixation lumbar two-three;  Surgeon: Kary Kos, MD;  Location:  [facility];  Service: Neurosurgery;  Laterality: N/A;  . NASAL SINUS SURGERY     1994   . radial head Right 01/14/2016   at [facility]  . RADIAL HEAD ARTHROPLASTY Right 01/26/2016   Procedure: RIGHT RADIAL HEAD REPLACEMENT ARTHROPLASTY WITH LIGAMENT REPAIRS;  Surgeon: Charlotte Crumb, MD;  Location: [facility];  Service: Orthopedics;  Laterality: Right;  Right elbow raial head replacement with possible ligament repairs as needed   . UPPER GASTROINTESTINAL ENDOSCOPY      FAMHx:  Family History  Problem Relation Age of Onset  . Dementia Mother   . Heart attack Mother   . Hypertension Mother   . Heart disease Mother   . Hyperlipidemia Mother   . Heart attack Father 58   . Heart disease Father   . Sudden death Father   . Hyperlipidemia Father   . Heart attack Maternal Grandmother 91  . Stroke Maternal Grandmother   . Colon cancer Neg Hx   . Colon polyps Neg Hx   . Esophageal cancer Neg Hx   . Stomach cancer Neg Hx   . Rectal cancer Neg Hx     SOCHx:   reports that she quit smoking about 40 years ago. Her smoking use included cigarettes. She quit after 3.00 years of use. She quit smokeless tobacco use about 40 years ago. She reports current alcohol use. She reports that she does not use drugs.  ALLERGIES:  Allergies  Allergen Reactions  . Propoxyphene Hcl Palpitations    darvon     ROS: Pertinent items noted in HPI and remainder of comprehensive ROS otherwise negative.  HOME MEDS: Current Outpatient Medications on File Prior to Visit  Medication Sig Dispense Refill  . Acetaminophen (TYLENOL PO) Take by mouth.    . Coenzyme Q10 (COQ-10) 100 MG CAPS in the morning and at bedtime. otc 30 capsule 0  . DULoxetine (CYMBALTA) 60 MG capsule Take 60 mg by mouth daily.    Marland Kitchen estradiol (ESTRACE) 1 MG tablet Take 1 mg by mouth every morning.     . fenofibrate micronized (LOFIBRA) 134 MG capsule Take 1 capsule (134 mg total) by mouth daily before breakfast. 90 capsule 3  . HYDROcodone-acetaminophen (NORCO) 10-325 MG tablet Take 1 tablet by mouth every 6 (six) hours as needed.    Marland Kitchen icosapent Ethyl (VASCEPA) 1 g capsule TAKE 2 CAPSULES(2 GRAMS) BY MOUTH TWICE DAILY 360 capsule 3  . lisinopril-hydrochlorothiazide (ZESTORETIC) 20-12.5 MG tablet Take 1 tablet by mouth daily. 90 tablet 1  . Multiple Vitamin (MULTIVITAMIN WITH MINERALS) TABS Take 1 tablet by mouth daily after supper. Women's 50 plus    . simvastatin (ZOCOR) 20 MG tablet TAKE 1 TABLET(20 MG) BY MOUTH AT BEDTIME 90 tablet 3  . ezetimibe (ZETIA) 10 MG tablet Take 1 tablet (10 mg total) by mouth daily. 90 tablet 3   No current facility-administered medications on file prior to visit.     LABS/IMAGING: No results Briggs for this or any previous visit (from the past 48 hour(s)). No results Briggs.  LIPID PANEL:    Component Value Date/Time   CHOL 197 06/29/2019 1019   TRIG 300 (H) 06/29/2019 1019   HDL 60 06/29/2019 1019   CHOLHDL 3.3 06/29/2019 1019   CHOLHDL 4.1 09/01/2014 0933   VLDL 55 (H) 09/01/2014 0933   LDLCALC 88 06/29/2019 1019    WEIGHTS: Wt Readings from Last 3 Encounters:  11/22/19 234 lb 9.6 oz (106.4 kg)  11/05/19 236 lb (107 kg)  10/28/19 232 lb (105.2 kg)    VITALS: BP 140/83   Pulse 78   Temp (!) 97.3 F (36.3 C)   Ht 5' 3.5" (1.613 m)   Wt 234 lb 9.6 oz (106.4 kg)   SpO2 98%   BMI 40.91 kg/m   EXAM: Deferred  EKG: Deferred  ASSESSMENT: 1. Mixed dyslipidemia, goal LDL less than 70 2. Coronary artery calcification-CAC score 69 3. Morbid obesity 4. Hypertension  PLAN: 1.   Ms. Hatton followed up today for repeat lipids however we have not had recheck of her numbers.  We will go ahead and order that and I will contact her with those results.  It would be ideal to get fasting since were looking at her triglycerides.  Hopefully the numbers are improved.  Plan otherwise follow-up with me in 6 months or sooner as necessary.  Pixie Casino, MD, [facility], [place] Director of the Advanced Lipid Disorders &  Cardiovascular Risk Reduction Clinic Diplomate of the American Board of Clinical Lipidology Attending Cardiologist  Direct Dial: [phone]  Fax: [phone]  Website:  [email or web address] 11/22/2019, 11:34 AM

## 2019-11-23 ENCOUNTER — Encounter (INDEPENDENT_AMBULATORY_CARE_PROVIDER_SITE_OTHER): Payer: Self-pay | Admitting: Family Medicine

## 2019-11-23 ENCOUNTER — Other Ambulatory Visit: Payer: Self-pay

## 2019-11-23 ENCOUNTER — Ambulatory Visit (INDEPENDENT_AMBULATORY_CARE_PROVIDER_SITE_OTHER): Payer: BC Managed Care – PPO | Admitting: Family Medicine

## 2019-11-23 VITALS — BP 144/79 | HR 78 | Temp 97.9°F | Ht 64.0 in | Wt 228.0 lb

## 2019-11-23 DIAGNOSIS — M545 Low back pain, unspecified: Secondary | ICD-10-CM | POA: Diagnosis not present

## 2019-11-23 DIAGNOSIS — I10 Essential (primary) hypertension: Secondary | ICD-10-CM | POA: Diagnosis not present

## 2019-11-23 DIAGNOSIS — E782 Mixed hyperlipidemia: Secondary | ICD-10-CM | POA: Diagnosis not present

## 2019-11-23 DIAGNOSIS — Z9189 Other specified personal risk factors, not elsewhere classified: Secondary | ICD-10-CM | POA: Diagnosis not present

## 2019-11-23 DIAGNOSIS — Z6839 Body mass index (BMI) 39.0-39.9, adult: Secondary | ICD-10-CM

## 2019-11-24 NOTE — Progress Notes (Signed)
Chief Complaint:   OBESITY Lauren Briggs is here to discuss her progress with her obesity treatment plan along with follow-up of her obesity related diagnoses.   Today's visit was #: 12 Starting weight: 234 lbs Starting date: 03/02/2019 Today's weight: 228 lbs Today's date: 11/23/2019 Total lbs lost to date: 6 lbs Body mass index is 39.14 kg/m.  Total weight loss percentage to date: -2.56%  Interim History:  Lauren Briggs will be having surgery with Dr. Saintclair Halsted on 12/22/2019.  After surgery, she will spend one night in the hospital.  She will be housebound for 2 weeks and have to wear a back brace for 2 months.  She has been to see Dr. Debara Pickett.    She is status post ESI last week, and says it helped for a few days.  Her next visit is on 11/11.     She says she is traveling to [facility] November 13-23.  She is getting a Honey Baked Ham for Thanksgiving.  Nutrition Plan: the Category 3 Plan for 85% of the time.  Activity: None.  Assessment/Plan:   1. Essential hypertension Not at goal. Medications: Zestoretic 20-12.5 mg daily. Plan: Avoid buying foods that are: processed, frozen, or prepackaged to avoid excess salt. We will continue to monitor symptoms as they relate to her weight loss journey.  BP Readings from Last 3 Encounters:  11/23/19 (!) 144/79  11/22/19 140/83  11/12/19 (!) 193/75   Lab Results  Component Value Date   CREATININE 0.63 05/26/2019   2. Mixed hyperlipidemia Plan: Dietary changes: Increase soluble fiber. Decrease simple carbohydrates. Exercise changes: Lauren Briggs average 40 minutes of moderate to vigorous-intensity aerobic activity 3 or 4 times per week. Lipid-lowering medications: Zetia 10 mg daily, fenofibrate 134 mg daily, Zocor 20 mg daily.   Lab Results  Component Value Date   CHOL 197 06/29/2019   HDL 60 06/29/2019   LDLCALC 88 06/29/2019   TRIG 300 (H) 06/29/2019   CHOLHDL 3.3 06/29/2019   Lab Results  Component Value Date   ALT 22 03/02/2019   AST 24 03/02/2019    ALKPHOS 60 03/02/2019   BILITOT 0.3 03/02/2019   The 10-year ASCVD risk score [name] DC Jr., et al., 2013) is: 8%   Values used to calculate the score:     Age: 8 years     Sex: Female     Is Non-Hispanic African American: No     Diabetic: No     Tobacco smoker: No     Systolic Blood Pressure: 366 mmHg     Is BP treated: Yes     HDL Cholesterol: 60 mg/dL     Total Cholesterol: 197 mg/dL  3. Lumbar pain On 12/22/2019, PLIF - L1-L2 with removal of hardware and L2-L3 interbody fusion.  Dr. Saintclair Halsted.  4. At risk for heart disease Lauren Briggs was given approximately 15 minutes of coronary artery disease prevention counseling today. She is 64 y.o. female and has risk factors for heart disease including obesity and metabolic syndrome. We discussed intensive lifestyle modifications today with Lauren Briggs emphasis on specific weight loss instructions and strategies. Repetitive spaced learning was employed today to elicit superior memory formation and behavioral change.  5. Class 2 severe obesity with serious comorbidity and body mass index (BMI) of 39.0 to 39.9 in adult, unspecified obesity type [facility])  Course: Lauren Briggs is currently in the action stage of change. As such, her goal is to continue with weight loss efforts.   Nutrition goals: She has agreed to the  Category 3 Plan.   Exercise goals: As tolerated.  Behavioral modification strategies: increasing lean protein intake, decreasing simple carbohydrates, increasing vegetables, increasing water intake, decreasing liquid calories and travel eating strategies.  Lauren Briggs has agreed to follow-up with our clinic in 2 weeks. She was informed of the importance of frequent follow-up visits to maximize her success with intensive lifestyle modifications for her multiple health conditions.   Objective:   Blood pressure (!) 144/79, pulse 78, temperature 97.9 F (36.6 C), temperature source Oral, height 5\' 4"  (1.626 m), weight 228 lb (103.4 kg), SpO2 98 %. Body mass index  is 39.14 kg/m.  General: Cooperative, alert, well developed, in no acute distress. HEENT: Conjunctivae and lids unremarkable. Cardiovascular: Regular rhythm.  Lungs: Normal work of breathing. Neurologic: No focal deficits.   Lab Results  Component Value Date   CREATININE 0.63 05/26/2019   BUN 20 05/26/2019   NA 139 05/26/2019   K 5.2 05/26/2019   CL 99 05/26/2019   CO2 27 05/26/2019   Lab Results  Component Value Date   ALT 22 03/02/2019   AST 24 03/02/2019   ALKPHOS 60 03/02/2019   BILITOT 0.3 03/02/2019   Lab Results  Component Value Date   HGBA1C 6.1 (H) 06/14/2019   HGBA1C 6.3 (H) 03/02/2019   HGBA1C 6.3 (H) 11/09/2018   Lab Results  Component Value Date   INSULIN 8.6 03/02/2019   Lab Results  Component Value Date   TSH 1.420 03/02/2019   Lab Results  Component Value Date   CHOL 197 06/29/2019   HDL 60 06/29/2019   LDLCALC 88 06/29/2019   TRIG 300 (H) 06/29/2019   CHOLHDL 3.3 06/29/2019   Lab Results  Component Value Date   WBC 6.1 03/02/2019   HGB 14.2 03/02/2019   HCT 42.7 03/02/2019   MCV 91 03/02/2019   PLT 318 03/02/2019   Lab Results  Component Value Date   IRON 91 03/02/2019   TIBC 480 (H) 03/02/2019   FERRITIN 101 03/02/2019   Attestation Statements:   Reviewed by clinician on day of visit: allergies, medications, problem list, medical history, surgical history, family history, social history, and previous encounter notes.  I, Water quality scientist, CMA, am acting as transcriptionist for Briscoe Deutscher, DO  I have reviewed the above documentation for accuracy and completeness, and I agree with the above. Briscoe Deutscher, DO

## 2019-11-29 DIAGNOSIS — E782 Mixed hyperlipidemia: Secondary | ICD-10-CM | POA: Diagnosis not present

## 2019-11-29 DIAGNOSIS — M4716 Other spondylosis with myelopathy, lumbar region: Secondary | ICD-10-CM | POA: Diagnosis not present

## 2019-11-29 LAB — LIPID PANEL
Chol/HDL Ratio: 2.8 ratio (ref 0.0–4.4)
Cholesterol, Total: 215 mg/dL — ABNORMAL HIGH (ref 100–199)
HDL: 78 mg/dL (ref >39)
LDL Chol Calc (NIH): 112 mg/dL — ABNORMAL HIGH (ref 0–99)
Triglycerides: 143 mg/dL (ref 0–149)
VLDL Cholesterol Cal: 25 mg/dL (ref 5–40)

## 2019-12-02 DIAGNOSIS — M5440 Lumbago with sciatica, unspecified side: Secondary | ICD-10-CM | POA: Diagnosis not present

## 2019-12-02 DIAGNOSIS — Z6839 Body mass index (BMI) 39.0-39.9, adult: Secondary | ICD-10-CM | POA: Diagnosis not present

## 2019-12-02 DIAGNOSIS — I10 Essential (primary) hypertension: Secondary | ICD-10-CM | POA: Diagnosis not present

## 2019-12-17 NOTE — Progress Notes (Signed)
Your procedure is scheduled on Wednesday, December 1st.  Report to [facility] Main Entrance "A" at 6:30 A.M., and check in at the Admitting office.  Call this number if you have problems the morning of surgery:  [phone]  Call [phone] if you have any questions prior to your surgery date Monday-Friday 8am-4pm   Remember:  Do not eat or drink after midnight the night before your surgery   Take these medicines the morning of surgery with A SIP OF WATER  estradiol (ESTRACE)  ezetimibe (ZETIA)  fenofibrate micronized (LOFIBRA)  fexofenadine (ALLEGRA)   If needed: acetaminophen (TYLENOL), HYDROcodone-acetaminophen (NORCO)    As of today, STOP taking any Aspirin (unless otherwise instructed by your surgeon) Aleve, Naproxen, Ibuprofen, Motrin, Advil, Goody's, BC's, all herbal medications, fish oil, and all vitamins.             Do not wear jewelry, make up, or nail polish            Do not wear lotions, powders, perfumes, or deodorant.            Do not shave 48 hours prior to surgery.              Do not bring valuables to the hospital.            [facility] is not responsible for any belongings or valuables.  Do NOT Smoke (Tobacco/Vaping) or drink Alcohol 24 hours prior to your procedure If you use a CPAP at night, you may bring all equipment for your overnight stay.   Contacts, glasses, dentures or bridgework may not be worn into surgery.      For patients admitted to the hospital, discharge time will be determined by your treatment team.   Patients discharged the day of surgery will not be allowed to drive home, and someone needs to stay with them for 24 hours.  Special instructions:   [place]- Preparing For Surgery  Before surgery, you can play an important role. Because skin is not sterile, your skin needs to be as free of germs as possible. You can reduce the number of germs on your skin by washing with CHG (chlorahexidine gluconate) Soap before surgery.  CHG is an  antiseptic cleaner which kills germs and bonds with the skin to continue killing germs even after washing.    Oral Hygiene is also important to reduce your risk of infection.  Remember - BRUSH YOUR TEETH THE MORNING OF SURGERY WITH YOUR REGULAR TOOTHPASTE  Please do not use if you have an allergy to CHG or antibacterial soaps. If your skin becomes reddened/irritated stop using the CHG.  Do not shave (including legs and underarms) for at least 48 hours prior to first CHG shower. It is OK to shave your face.  Please follow these instructions carefully.   1. Shower the NIGHT BEFORE SURGERY and the MORNING OF SURGERY with CHG Soap.   2. If you chose to wash your hair, wash your hair first as usual with your normal shampoo.  3. After you shampoo, rinse your hair and body thoroughly to remove the shampoo.  4. Use CHG as you would any other liquid soap. You can apply CHG directly to the skin and wash gently with a scrungie or a clean washcloth.   5. Apply the CHG Soap to your body ONLY FROM THE NECK DOWN.  Do not use on open wounds or open sores. Avoid contact with your eyes, ears, mouth and genitals (private parts).  Wash Face and genitals (private parts)  with your normal soap.   6. Wash thoroughly, paying special attention to the area where your surgery will be performed.  7. Thoroughly rinse your body with warm water from the neck down.  8. DO NOT shower/wash with your normal soap after using and rinsing off the CHG Soap.  9. Pat yourself dry with a CLEAN TOWEL.  10. Wear CLEAN PAJAMAS to bed the night before surgery  11. Place CLEAN SHEETS on your bed the night of your first shower and DO NOT SLEEP WITH PETS.  Day of Surgery: Wear Clean/Comfortable clothing the morning of surgery Do not apply any deodorants/lotions.   Remember to brush your teeth WITH YOUR REGULAR TOOTHPASTE.   Please read over the following fact sheets that you were given.

## 2019-12-19 ENCOUNTER — Other Ambulatory Visit: Payer: Self-pay | Admitting: Cardiovascular Disease

## 2019-12-20 ENCOUNTER — Other Ambulatory Visit: Payer: Self-pay

## 2019-12-20 ENCOUNTER — Other Ambulatory Visit (HOSPITAL_COMMUNITY)
Admission: RE | Admit: 2019-12-20 | Discharge: 2019-12-20 | Disposition: A | Discharge status: Home / Self Care | Payer: BLUE CROSS/BLUE SHIELD | Source: Ambulatory Visit | Attending: Neurosurgery | Admitting: Neurosurgery

## 2019-12-20 ENCOUNTER — Encounter (HOSPITAL_COMMUNITY)
Admission: RE | Admit: 2019-12-20 | Discharge: 2019-12-20 | Disposition: A | Discharge status: Home / Self Care | Payer: BLUE CROSS/BLUE SHIELD | Source: Ambulatory Visit | Attending: Neurosurgery | Admitting: Neurosurgery

## 2019-12-20 ENCOUNTER — Encounter (HOSPITAL_COMMUNITY): Payer: Self-pay

## 2019-12-20 DIAGNOSIS — Z01812 Encounter for preprocedural laboratory examination: Secondary | ICD-10-CM | POA: Insufficient documentation

## 2019-12-20 DIAGNOSIS — Z20822 Contact with and (suspected) exposure to covid-19: Secondary | ICD-10-CM | POA: Insufficient documentation

## 2019-12-20 HISTORY — DX: Prediabetes: R73.03

## 2019-12-20 LAB — BASIC METABOLIC PANEL
Anion gap: 9 (ref 5–15)
BUN: 25 mg/dL — ABNORMAL HIGH (ref 8–23)
CO2: 26 mmol/L (ref 22–32)
Calcium: 9.5 mg/dL (ref 8.9–10.3)
Chloride: 103 mmol/L (ref 98–111)
Creatinine, Ser: 0.73 mg/dL (ref 0.44–1.00)
GFR, Estimated: >60 mL/min (ref >60)
Glucose, Bld: 121 mg/dL — ABNORMAL HIGH (ref 70–99)
Potassium: 4.1 mmol/L (ref 3.5–5.1)
Sodium: 138 mmol/L (ref 135–145)

## 2019-12-20 LAB — TYPE AND SCREEN
ABO/RH(D): O POS
Antibody Screen: NEGATIVE

## 2019-12-20 LAB — SURGICAL PCR SCREEN
MRSA, PCR: NEGATIVE
Staphylococcus aureus: NEGATIVE

## 2019-12-20 LAB — CBC
HCT: 41.5 % (ref 36.0–46.0)
Hemoglobin: 13.2 g/dL (ref 12.0–15.0)
MCH: 30.5 pg (ref 26.0–34.0)
MCHC: 31.8 g/dL (ref 30.0–36.0)
MCV: 95.8 fL (ref 80.0–100.0)
Platelets: 356 10*3/uL (ref 150–400)
RBC: 4.33 MIL/uL (ref 3.87–5.11)
RDW: 12.2 % (ref 11.5–15.5)
WBC: 9 10*3/uL (ref 4.0–10.5)
nRBC: 0 % (ref 0.0–0.2)

## 2019-12-20 LAB — SARS CORONAVIRUS 2 (TAT 6-24 HRS): SARS Coronavirus 2: NEGATIVE

## 2019-12-20 NOTE — Progress Notes (Signed)
PCP - V. Hensen PA-C  Cardiologist - Dr. Debara Pickett  Chest x-ray - Denies  EKG - 11/05/19 (E)  Stress Test - Denies  ECHO - Denies  Cardiac Cath - Denies  AICD-na PM-na LOOP-na  Sleep Study - Denies CPAP - Denies  LABS- 12/20/19: CBC, BMP, PCR, T/S, COVID  ASA- Denies  ERAS- No  HA1C- 6.1- 06/14/19- prediabetic Checks Blood Sugar ___0__ times a day  Anesthesia- No  Pt denies having chest pain, sob, or fever at this time. All instructions explained to the pt, with a verbal understanding of the material. Pt agrees to go over the instructions while at home for a better understanding. Pt also instructed to self quarantine after being tested for COVID-19. The opportunity to ask questions was provided.   Coronavirus Screening  Have you experienced the following symptoms:  Cough yes/no: No Fever (>100.33F)  yes/no: No Runny nose yes/no: No Sore throat yes/no: No Difficulty breathing/shortness of breath  yes/no: No  Have you or a family member traveled in the last 14 days and where? yes/no: No   If the patient indicates "YES" to the above questions, their PAT will be rescheduled to limit the exposure to others and, the surgeon will be notified. THE PATIENT WILL NEED TO BE ASYMPTOMATIC FOR 14 DAYS.   If the patient is not experiencing any of these symptoms, the PAT nurse will instruct them to NOT bring anyone with them to their appointment since they may have these symptoms or traveled as well.   Please remind your patients and families that hospital visitation restrictions are in effect and the importance of the restrictions.

## 2019-12-21 ENCOUNTER — Ambulatory Visit (INDEPENDENT_AMBULATORY_CARE_PROVIDER_SITE_OTHER): Payer: BC Managed Care – PPO | Admitting: Family Medicine

## 2019-12-21 ENCOUNTER — Encounter (INDEPENDENT_AMBULATORY_CARE_PROVIDER_SITE_OTHER): Payer: Self-pay | Admitting: Family Medicine

## 2019-12-21 VITALS — BP 153/75 | HR 88 | Temp 98.1°F | Ht 64.0 in | Wt 234.0 lb

## 2019-12-21 DIAGNOSIS — E8881 Metabolic syndrome: Secondary | ICD-10-CM | POA: Diagnosis not present

## 2019-12-21 DIAGNOSIS — Z6841 Body Mass Index (BMI) 40.0 and over, adult: Secondary | ICD-10-CM

## 2019-12-21 DIAGNOSIS — I10 Essential (primary) hypertension: Secondary | ICD-10-CM | POA: Diagnosis not present

## 2019-12-21 DIAGNOSIS — M545 Low back pain, unspecified: Secondary | ICD-10-CM

## 2019-12-21 DIAGNOSIS — E66813 Obesity, class 3: Secondary | ICD-10-CM

## 2019-12-21 NOTE — Anesthesia Preprocedure Evaluation (Addendum)
Anesthesia Evaluation  Patient identified by MRN, date of birth, ID band Patient awake    Reviewed: Allergy & Precautions, NPO status , Unable to perform ROS - Chart review only  Airway Mallampati: III  TM Distance: <3 FB Neck ROM: Full    Dental  (+) Chipped   Pulmonary former smoker,    Pulmonary exam normal breath sounds clear to auscultation       Cardiovascular hypertension, Normal cardiovascular exam Rhythm:Regular Rate:Normal     Neuro/Psych negative psych ROS   GI/Hepatic negative GI ROS, Neg liver ROS,   Endo/Other  diabetesMorbid obesityhypertriglyceridemia  Renal/GU negative Renal ROS  negative genitourinary   Musculoskeletal  (+) Arthritis ,   Abdominal (+) + obese,   Peds negative pediatric ROS (+)  Hematology negative hematology ROS (+)   Anesthesia Other Findings   Reproductive/Obstetrics negative OB ROS                            Anesthesia Physical Anesthesia Plan  ASA: III  Anesthesia Plan: General   Post-op Pain Management:    Induction: Intravenous  PONV Risk Score and Plan: 3 and Ondansetron, Dexamethasone and Scopolamine patch - Pre-op  Airway Management Planned: Oral ETT and Video Laryngoscope Planned  Additional Equipment:   Intra-op Plan:   Post-operative Plan: Extubation in OR  Informed Consent: I have reviewed the patients History and Physical, chart, labs and discussed the procedure including the risks, benefits and alternatives for the proposed anesthesia with the patient or authorized representative who has indicated his/her understanding and acceptance.     Dental advisory given  Plan Discussed with: CRNA and Anesthesiologist  Anesthesia Plan Comments: (Glidescope given h/o chipped tooth. GETA. 2 PIV. )       Anesthesia Quick Evaluation

## 2019-12-22 ENCOUNTER — Inpatient Hospital Stay (HOSPITAL_COMMUNITY)
Admission: RE | Admit: 2019-12-22 | Discharge: 2019-12-23 | DRG: 454 | Disposition: A | Discharge status: Home / Self Care | Payer: BC Managed Care – PPO | Attending: Neurosurgery | Admitting: Neurosurgery

## 2019-12-22 ENCOUNTER — Inpatient Hospital Stay (HOSPITAL_COMMUNITY): Payer: BC Managed Care – PPO | Admitting: Anesthesiology

## 2019-12-22 ENCOUNTER — Encounter (HOSPITAL_COMMUNITY)
Admission: RE | Disposition: A | Discharge status: Home / Self Care | Payer: Self-pay | Source: Home / Self Care | Attending: Neurosurgery

## 2019-12-22 ENCOUNTER — Inpatient Hospital Stay (HOSPITAL_COMMUNITY): Payer: BC Managed Care – PPO

## 2019-12-22 ENCOUNTER — Encounter (HOSPITAL_COMMUNITY): Payer: Self-pay | Admitting: Neurosurgery

## 2019-12-22 ENCOUNTER — Other Ambulatory Visit: Payer: Self-pay | Admitting: Internal Medicine

## 2019-12-22 ENCOUNTER — Other Ambulatory Visit: Payer: Self-pay

## 2019-12-22 DIAGNOSIS — Z6841 Body Mass Index (BMI) 40.0 and over, adult: Secondary | ICD-10-CM

## 2019-12-22 DIAGNOSIS — Z20822 Contact with and (suspected) exposure to covid-19: Secondary | ICD-10-CM | POA: Diagnosis not present

## 2019-12-22 DIAGNOSIS — E65 Localized adiposity: Secondary | ICD-10-CM | POA: Diagnosis not present

## 2019-12-22 DIAGNOSIS — Z90711 Acquired absence of uterus with remaining cervical stump: Secondary | ICD-10-CM

## 2019-12-22 DIAGNOSIS — Z9189 Other specified personal risk factors, not elsewhere classified: Secondary | ICD-10-CM | POA: Diagnosis not present

## 2019-12-22 DIAGNOSIS — R7303 Prediabetes: Secondary | ICD-10-CM | POA: Diagnosis not present

## 2019-12-22 DIAGNOSIS — Z981 Arthrodesis status: Secondary | ICD-10-CM

## 2019-12-22 DIAGNOSIS — M48061 Spinal stenosis, lumbar region without neurogenic claudication: Secondary | ICD-10-CM | POA: Diagnosis not present

## 2019-12-22 DIAGNOSIS — M4326 Fusion of spine, lumbar region: Secondary | ICD-10-CM | POA: Diagnosis not present

## 2019-12-22 DIAGNOSIS — M4316 Spondylolisthesis, lumbar region: Secondary | ICD-10-CM | POA: Diagnosis present

## 2019-12-22 DIAGNOSIS — Z780 Asymptomatic menopausal state: Secondary | ICD-10-CM | POA: Diagnosis not present

## 2019-12-22 DIAGNOSIS — M5136 Other intervertebral disc degeneration, lumbar region: Secondary | ICD-10-CM | POA: Diagnosis not present

## 2019-12-22 DIAGNOSIS — Z8249 Family history of ischemic heart disease and other diseases of the circulatory system: Secondary | ICD-10-CM

## 2019-12-22 DIAGNOSIS — Z83438 Family history of other disorder of lipoprotein metabolism and other lipidemia: Secondary | ICD-10-CM

## 2019-12-22 DIAGNOSIS — Z79899 Other long term (current) drug therapy: Secondary | ICD-10-CM

## 2019-12-22 DIAGNOSIS — E782 Mixed hyperlipidemia: Secondary | ICD-10-CM | POA: Diagnosis present

## 2019-12-22 DIAGNOSIS — I10 Essential (primary) hypertension: Secondary | ICD-10-CM | POA: Diagnosis present

## 2019-12-22 DIAGNOSIS — M1990 Unspecified osteoarthritis, unspecified site: Secondary | ICD-10-CM | POA: Diagnosis present

## 2019-12-22 DIAGNOSIS — F3289 Other specified depressive episodes: Secondary | ICD-10-CM | POA: Diagnosis not present

## 2019-12-22 DIAGNOSIS — Z823 Family history of stroke: Secondary | ICD-10-CM | POA: Diagnosis not present

## 2019-12-22 DIAGNOSIS — M47816 Spondylosis without myelopathy or radiculopathy, lumbar region: Secondary | ICD-10-CM | POA: Diagnosis not present

## 2019-12-22 DIAGNOSIS — Z888 Allergy status to other drugs, medicaments and biological substances status: Secondary | ICD-10-CM

## 2019-12-22 DIAGNOSIS — M5442 Lumbago with sciatica, left side: Secondary | ICD-10-CM | POA: Diagnosis not present

## 2019-12-22 DIAGNOSIS — Z419 Encounter for procedure for purposes other than remedying health state, unspecified: Secondary | ICD-10-CM

## 2019-12-22 DIAGNOSIS — Z87891 Personal history of nicotine dependence: Secondary | ICD-10-CM | POA: Diagnosis not present

## 2019-12-22 DIAGNOSIS — M5126 Other intervertebral disc displacement, lumbar region: Secondary | ICD-10-CM | POA: Diagnosis not present

## 2019-12-22 DIAGNOSIS — R4789 Other speech disturbances: Secondary | ICD-10-CM | POA: Diagnosis not present

## 2019-12-22 LAB — GLUCOSE, CAPILLARY: Glucose-Capillary: 116 mg/dL — ABNORMAL HIGH (ref 70–99)

## 2019-12-22 SURGERY — POSTERIOR LUMBAR FUSION 1 WITH HARDWARE REMOVAL
Anesthesia: General | Site: Back

## 2019-12-22 MED ORDER — ONDANSETRON HCL 4 MG PO TABS: 4.0000 mg | ORAL_TABLET | Freq: Four times a day (QID) | ORAL | Status: DC | PRN

## 2019-12-22 MED ORDER — PROMETHAZINE HCL 25 MG/ML IJ SOLN: 6.2500 mg | INTRAMUSCULAR | Status: DC | PRN

## 2019-12-22 MED ORDER — ROCURONIUM BROMIDE 10 MG/ML (PF) SYRINGE
PREFILLED_SYRINGE | INTRAVENOUS | Status: AC
  Filled 2019-12-22: qty 10

## 2019-12-22 MED ORDER — FENOFIBRATE 54 MG PO TABS
54.0000 mg | ORAL_TABLET | Freq: Every day | ORAL | Status: DC
  Administered 2019-12-23: 54 mg via ORAL
  Filled 2019-12-22 (×2): qty 1

## 2019-12-22 MED ORDER — ACETAMINOPHEN 500 MG PO TABS: 1000.0000 mg | ORAL_TABLET | Freq: Three times a day (TID) | ORAL | Status: DC | PRN

## 2019-12-22 MED ORDER — MIDAZOLAM HCL 5 MG/5ML IJ SOLN
INTRAMUSCULAR | Status: DC | PRN
  Administered 2019-12-22: 2 mg via INTRAVENOUS

## 2019-12-22 MED ORDER — CHLORHEXIDINE GLUCONATE CLOTH 2 % EX PADS: 6.0000 | MEDICATED_PAD | Freq: Once | CUTANEOUS | Status: DC

## 2019-12-22 MED ORDER — LIDOCAINE HCL (PF) 2 % IJ SOLN
INTRAMUSCULAR | Status: AC
  Filled 2019-12-22: qty 5

## 2019-12-22 MED ORDER — ACETAMINOPHEN 500 MG PO TABS: 1000.0000 mg | ORAL_TABLET | Freq: Once | ORAL | Status: AC

## 2019-12-22 MED ORDER — LIDOCAINE-EPINEPHRINE 1 %-1:100000 IJ SOLN
INTRAMUSCULAR | Status: AC
  Filled 2019-12-22: qty 1

## 2019-12-22 MED ORDER — LACTATED RINGERS IV SOLN: INTRAVENOUS | Status: DC | PRN

## 2019-12-22 MED ORDER — ORAL CARE MOUTH RINSE: 15.0000 mL | Freq: Once | OROMUCOSAL | Status: AC

## 2019-12-22 MED ORDER — ESTRADIOL 10 MCG VA TABS: 10.0000 ug | ORAL_TABLET | VAGINAL | Status: DC

## 2019-12-22 MED ORDER — HYDROCODONE-ACETAMINOPHEN 10-325 MG PO TABS: 1.0000 | ORAL_TABLET | ORAL | Status: DC | PRN

## 2019-12-22 MED ORDER — SODIUM CHLORIDE 0.9 % IV SOLN
250.0000 mL | INTRAVENOUS | Status: DC
  Administered 2019-12-22: 250 mL via INTRAVENOUS

## 2019-12-22 MED ORDER — SCOPOLAMINE 1 MG/3DAYS TD PT72: 1.0000 | MEDICATED_PATCH | TRANSDERMAL | Status: DC

## 2019-12-22 MED ORDER — DEXAMETHASONE SODIUM PHOSPHATE 10 MG/ML IJ SOLN
INTRAMUSCULAR | Status: AC
  Filled 2019-12-22: qty 1

## 2019-12-22 MED ORDER — BUPIVACAINE LIPOSOME 1.3 % IJ SUSP
20.0000 mL | INTRAMUSCULAR | Status: AC
  Administered 2019-12-22: 20 mL
  Filled 2019-12-22: qty 20

## 2019-12-22 MED ORDER — PHENYLEPHRINE 40 MCG/ML (10ML) SYRINGE FOR IV PUSH (FOR BLOOD PRESSURE SUPPORT)
PREFILLED_SYRINGE | INTRAVENOUS | Status: DC | PRN
  Administered 2019-12-22: 80 ug via INTRAVENOUS
  Administered 2019-12-22: 120 ug via INTRAVENOUS
  Administered 2019-12-22 (×2): 80 ug via INTRAVENOUS

## 2019-12-22 MED ORDER — ALUM & MAG HYDROXIDE-SIMETH 200-200-20 MG/5ML PO SUSP: 30.0000 mL | Freq: Four times a day (QID) | ORAL | Status: DC | PRN

## 2019-12-22 MED ORDER — COQ-10 100 MG PO CAPS: 100.0000 mg | ORAL_CAPSULE | Freq: Two times a day (BID) | ORAL | Status: DC

## 2019-12-22 MED ORDER — HYDROCHLOROTHIAZIDE 12.5 MG PO CAPS
12.5000 mg | ORAL_CAPSULE | Freq: Every day | ORAL | Status: DC
  Administered 2019-12-23: 12.5 mg via ORAL
  Filled 2019-12-22: qty 1

## 2019-12-22 MED ORDER — ACETAMINOPHEN 325 MG PO TABS
650.0000 mg | ORAL_TABLET | ORAL | Status: DC | PRN
  Administered 2019-12-22 – 2019-12-23 (×3): 650 mg via ORAL
  Filled 2019-12-22 (×3): qty 2

## 2019-12-22 MED ORDER — HYDROMORPHONE HCL 1 MG/ML IJ SOLN
0.5000 mg | INTRAMUSCULAR | Status: DC | PRN
  Administered 2019-12-22: 0.5 mg via INTRAVENOUS
  Filled 2019-12-22: qty 0.5

## 2019-12-22 MED ORDER — 0.9 % SODIUM CHLORIDE (POUR BTL) OPTIME
TOPICAL | Status: DC | PRN
  Administered 2019-12-22: 1000 mL

## 2019-12-22 MED ORDER — CYCLOBENZAPRINE HCL 10 MG PO TABS
10.0000 mg | ORAL_TABLET | Freq: Three times a day (TID) | ORAL | Status: DC | PRN
  Administered 2019-12-22: 10 mg via ORAL
  Filled 2019-12-22 (×3): qty 1

## 2019-12-22 MED ORDER — THROMBIN 20000 UNITS EX SOLR
CUTANEOUS | Status: DC | PRN
  Administered 2019-12-22: 20 mL via TOPICAL

## 2019-12-22 MED ORDER — PROPOFOL 10 MG/ML IV BOLUS
INTRAVENOUS | Status: DC | PRN
  Administered 2019-12-22: 150 mg via INTRAVENOUS

## 2019-12-22 MED ORDER — ACETAMINOPHEN 650 MG RE SUPP: 650.0000 mg | RECTAL | Status: DC | PRN

## 2019-12-22 MED ORDER — DEXAMETHASONE SODIUM PHOSPHATE 10 MG/ML IJ SOLN: 10.0000 mg | Freq: Once | INTRAMUSCULAR | Status: DC

## 2019-12-22 MED ORDER — HYDROMORPHONE HCL 1 MG/ML IJ SOLN
INTRAMUSCULAR | Status: AC
  Filled 2019-12-22: qty 1

## 2019-12-22 MED ORDER — MIDAZOLAM HCL 2 MG/2ML IJ SOLN
INTRAMUSCULAR | Status: AC
  Filled 2019-12-22: qty 2

## 2019-12-22 MED ORDER — CEFAZOLIN SODIUM-DEXTROSE 2-4 GM/100ML-% IV SOLN
2.0000 g | Freq: Three times a day (TID) | INTRAVENOUS | Status: DC
  Administered 2019-12-22 (×2): 2 g via INTRAVENOUS
  Filled 2019-12-22 (×2): qty 100

## 2019-12-22 MED ORDER — ONDANSETRON HCL 4 MG/2ML IJ SOLN: 4.0000 mg | Freq: Four times a day (QID) | INTRAMUSCULAR | Status: DC | PRN

## 2019-12-22 MED ORDER — ADULT MULTIVITAMIN W/MINERALS CH
1.0000 | ORAL_TABLET | Freq: Every day | ORAL | Status: DC
  Administered 2019-12-22: 1 via ORAL
  Filled 2019-12-22: qty 1

## 2019-12-22 MED ORDER — FENTANYL CITRATE (PF) 250 MCG/5ML IJ SOLN
INTRAMUSCULAR | Status: AC
  Filled 2019-12-22: qty 5

## 2019-12-22 MED ORDER — SUGAMMADEX SODIUM 200 MG/2ML IV SOLN
INTRAVENOUS | Status: DC | PRN
  Administered 2019-12-22: 200 mg via INTRAVENOUS

## 2019-12-22 MED ORDER — LIDOCAINE-EPINEPHRINE 1 %-1:100000 IJ SOLN
INTRAMUSCULAR | Status: DC | PRN
  Administered 2019-12-22: 10 mL

## 2019-12-22 MED ORDER — CHLORHEXIDINE GLUCONATE 0.12 % MT SOLN
15.0000 mL | Freq: Once | OROMUCOSAL | Status: AC
  Administered 2019-12-22: 15 mL via OROMUCOSAL

## 2019-12-22 MED ORDER — PANTOPRAZOLE SODIUM 40 MG IV SOLR: 40.0000 mg | Freq: Every day | INTRAVENOUS | Status: DC

## 2019-12-22 MED ORDER — ROCURONIUM BROMIDE 10 MG/ML (PF) SYRINGE
PREFILLED_SYRINGE | INTRAVENOUS | Status: DC | PRN
  Administered 2019-12-22: 80 mg via INTRAVENOUS
  Administered 2019-12-22: 20 mg via INTRAVENOUS

## 2019-12-22 MED ORDER — SIMVASTATIN 20 MG PO TABS
20.0000 mg | ORAL_TABLET | Freq: Every day | ORAL | Status: DC
  Administered 2019-12-22: 20 mg via ORAL
  Filled 2019-12-22: qty 1

## 2019-12-22 MED ORDER — ESTRADIOL 1 MG PO TABS
1.0000 mg | ORAL_TABLET | ORAL | Status: DC
  Administered 2019-12-23: 1 mg via ORAL
  Filled 2019-12-22: qty 1

## 2019-12-22 MED ORDER — THROMBIN 20000 UNITS EX SOLR
CUTANEOUS | Status: AC
  Filled 2019-12-22: qty 20000

## 2019-12-22 MED ORDER — FENTANYL CITRATE (PF) 100 MCG/2ML IJ SOLN
INTRAMUSCULAR | Status: DC | PRN
  Administered 2019-12-22 (×2): 100 ug via INTRAVENOUS

## 2019-12-22 MED ORDER — ONDANSETRON HCL 4 MG/2ML IJ SOLN
INTRAMUSCULAR | Status: DC | PRN
  Administered 2019-12-22: 4 mg via INTRAVENOUS

## 2019-12-22 MED ORDER — CEFAZOLIN SODIUM-DEXTROSE 2-4 GM/100ML-% IV SOLN
2.0000 g | INTRAVENOUS | Status: AC
  Administered 2019-12-22: 2 g via INTRAVENOUS

## 2019-12-22 MED ORDER — CYCLOBENZAPRINE HCL 10 MG PO TABS
ORAL_TABLET | ORAL | Status: AC
  Filled 2019-12-22: qty 1

## 2019-12-22 MED ORDER — LACTATED RINGERS IV SOLN: INTRAVENOUS | Status: DC

## 2019-12-22 MED ORDER — HYDROMORPHONE HCL 1 MG/ML IJ SOLN
0.2500 mg | INTRAMUSCULAR | Status: DC | PRN
  Administered 2019-12-22 (×4): 0.5 mg via INTRAVENOUS

## 2019-12-22 MED ORDER — SCOPOLAMINE 1 MG/3DAYS TD PT72
MEDICATED_PATCH | TRANSDERMAL | Status: AC
  Administered 2019-12-22: 1.5 mg via TRANSDERMAL
  Filled 2019-12-22: qty 1

## 2019-12-22 MED ORDER — PHENOL 1.4 % MT LIQD: 1.0000 | OROMUCOSAL | Status: DC | PRN

## 2019-12-22 MED ORDER — PANTOPRAZOLE SODIUM 40 MG PO TBEC
40.0000 mg | DELAYED_RELEASE_TABLET | Freq: Every day | ORAL | Status: DC
  Administered 2019-12-23: 40 mg via ORAL
  Filled 2019-12-22: qty 1

## 2019-12-22 MED ORDER — OXYCODONE HCL 5 MG PO TABS
ORAL_TABLET | ORAL | Status: AC
  Filled 2019-12-22: qty 2

## 2019-12-22 MED ORDER — DULOXETINE HCL 60 MG PO CPEP
60.0000 mg | ORAL_CAPSULE | Freq: Every day | ORAL | Status: DC
  Administered 2019-12-22: 60 mg via ORAL
  Filled 2019-12-22: qty 1
  Filled 2019-12-22: qty 2

## 2019-12-22 MED ORDER — MENTHOL 3 MG MT LOZG: 1.0000 | LOZENGE | OROMUCOSAL | Status: DC | PRN

## 2019-12-22 MED ORDER — OXYCODONE HCL 5 MG PO TABS
10.0000 mg | ORAL_TABLET | ORAL | Status: DC | PRN
  Administered 2019-12-22 – 2019-12-23 (×6): 10 mg via ORAL
  Filled 2019-12-22 (×5): qty 2

## 2019-12-22 MED ORDER — LIDOCAINE 2% (20 MG/ML) 5 ML SYRINGE
INTRAMUSCULAR | Status: DC | PRN
  Administered 2019-12-22: 60 mg via INTRAVENOUS

## 2019-12-22 MED ORDER — DEXAMETHASONE SODIUM PHOSPHATE 4 MG/ML IJ SOLN
INTRAMUSCULAR | Status: DC | PRN
  Administered 2019-12-22: 10 mg via INTRAVENOUS

## 2019-12-22 MED ORDER — LISINOPRIL-HYDROCHLOROTHIAZIDE 20-12.5 MG PO TABS: 1.0000 | ORAL_TABLET | Freq: Every day | ORAL | Status: DC

## 2019-12-22 MED ORDER — EZETIMIBE 10 MG PO TABS
10.0000 mg | ORAL_TABLET | Freq: Every day | ORAL | Status: DC
  Administered 2019-12-23: 10 mg via ORAL
  Filled 2019-12-22: qty 1

## 2019-12-22 MED ORDER — PHENYLEPHRINE 40 MCG/ML (10ML) SYRINGE FOR IV PUSH (FOR BLOOD PRESSURE SUPPORT)
PREFILLED_SYRINGE | INTRAVENOUS | Status: AC
  Filled 2019-12-22: qty 10

## 2019-12-22 MED ORDER — SODIUM CHLORIDE 0.9% FLUSH
3.0000 mL | Freq: Two times a day (BID) | INTRAVENOUS | Status: DC
  Administered 2019-12-22: 3 mL via INTRAVENOUS

## 2019-12-22 MED ORDER — ICOSAPENT ETHYL 1 G PO CAPS
2.0000 g | ORAL_CAPSULE | Freq: Two times a day (BID) | ORAL | Status: DC
  Administered 2019-12-23: 2 g via ORAL
  Filled 2019-12-22 (×2): qty 2

## 2019-12-22 MED ORDER — PHENYLEPHRINE HCL-NACL 10-0.9 MG/250ML-% IV SOLN
INTRAVENOUS | Status: DC | PRN
  Administered 2019-12-22: 50 ug/min via INTRAVENOUS

## 2019-12-22 MED ORDER — LORATADINE 10 MG PO TABS
10.0000 mg | ORAL_TABLET | Freq: Every day | ORAL | Status: DC
  Administered 2019-12-23: 10 mg via ORAL
  Filled 2019-12-22: qty 1

## 2019-12-22 MED ORDER — ONDANSETRON HCL 4 MG/2ML IJ SOLN
INTRAMUSCULAR | Status: AC
  Filled 2019-12-22: qty 2

## 2019-12-22 MED ORDER — ACETAMINOPHEN 500 MG PO TABS
ORAL_TABLET | ORAL | Status: AC
  Administered 2019-12-22: 1000 mg via ORAL
  Filled 2019-12-22: qty 2

## 2019-12-22 MED ORDER — LISINOPRIL 20 MG PO TABS
20.0000 mg | ORAL_TABLET | Freq: Every day | ORAL | Status: DC
  Administered 2019-12-23: 20 mg via ORAL
  Filled 2019-12-22: qty 1

## 2019-12-22 MED ORDER — SODIUM CHLORIDE 0.9% FLUSH: 3.0000 mL | INTRAVENOUS | Status: DC | PRN

## 2019-12-22 MED ORDER — AMISULPRIDE (ANTIEMETIC) 5 MG/2ML IV SOLN: 10.0000 mg | Freq: Once | INTRAVENOUS | Status: DC | PRN

## 2019-12-22 MED ORDER — CEFAZOLIN SODIUM-DEXTROSE 2-4 GM/100ML-% IV SOLN
INTRAVENOUS | Status: AC
  Filled 2019-12-22: qty 100

## 2019-12-22 MED ORDER — PROPOFOL 10 MG/ML IV BOLUS
INTRAVENOUS | Status: AC
  Filled 2019-12-22: qty 40

## 2019-12-22 SURGICAL SUPPLY — 74 items
BASKET BONE COLLECTION (BASKET) ×2 IMPLANT
BENZOIN TINCTURE PRP APPL 2/3 (GAUZE/BANDAGES/DRESSINGS) ×2 IMPLANT
BLADE CLIPPER SURG (BLADE) IMPLANT
BLADE SURG 11 STRL SS (BLADE) ×2 IMPLANT
BONE VIVIGEN FORMABLE 5.4CC (Bone Implant) ×2 IMPLANT
BUR CUTTER 7.0 ROUND (BURR) ×2 IMPLANT
BUR MATCHSTICK NEURO 3.0 LAGG (BURR) ×2 IMPLANT
CANISTER SUCT 3000ML PPV (MISCELLANEOUS) ×2 IMPLANT
CAP LOCKING THREADED (Cap) ×10 IMPLANT
CARTRIDGE OIL MAESTRO DRILL (MISCELLANEOUS) ×1 IMPLANT
CNTNR URN SCR LID CUP LEK RST (MISCELLANEOUS) ×1 IMPLANT
CONT SPEC 4OZ STRL OR WHT (MISCELLANEOUS) ×2
COVER BACK TABLE 60X90IN (DRAPES) ×2 IMPLANT
COVER WAND RF STERILE (DRAPES) ×2 IMPLANT
DECANTER SPIKE VIAL GLASS SM (MISCELLANEOUS) ×2 IMPLANT
DERMABOND ADVANCED (GAUZE/BANDAGES/DRESSINGS) ×1
DERMABOND ADVANCED .7 DNX12 (GAUZE/BANDAGES/DRESSINGS) ×1 IMPLANT
DIFFUSER DRILL AIR PNEUMATIC (MISCELLANEOUS) ×2 IMPLANT
DRAPE C-ARM 42X72 X-RAY (DRAPES) ×4 IMPLANT
DRAPE C-ARMOR (DRAPES) ×2 IMPLANT
DRAPE HALF SHEET 40X57 (DRAPES) IMPLANT
DRAPE LAPAROTOMY 100X72X124 (DRAPES) ×2 IMPLANT
DRAPE SURG 17X23 STRL (DRAPES) ×2 IMPLANT
DRSG OPSITE 4X5.5 SM (GAUZE/BANDAGES/DRESSINGS) ×2 IMPLANT
DRSG OPSITE POSTOP 4X6 (GAUZE/BANDAGES/DRESSINGS) ×2 IMPLANT
DRSG OPSITE POSTOP 4X8 (GAUZE/BANDAGES/DRESSINGS) ×2 IMPLANT
DURAPREP 26ML APPLICATOR (WOUND CARE) ×2 IMPLANT
ELECT REM PT RETURN 9FT ADLT (ELECTROSURGICAL) ×2
ELECTRODE REM PT RTRN 9FT ADLT (ELECTROSURGICAL) ×1 IMPLANT
EVACUATOR 1/8 PVC DRAIN (DRAIN) ×2 IMPLANT
EVACUATOR 3/16  PVC DRAIN (DRAIN) ×2
EVACUATOR 3/16 PVC DRAIN (DRAIN) ×1 IMPLANT
GAUZE 4X4 16PLY RFD (DISPOSABLE) IMPLANT
GAUZE SPONGE 4X4 12PLY STRL (GAUZE/BANDAGES/DRESSINGS) ×2 IMPLANT
GLOVE BIO SURGEON STRL SZ7 (GLOVE) IMPLANT
GLOVE BIO SURGEON STRL SZ8 (GLOVE) ×4 IMPLANT
GLOVE BIOGEL PI IND STRL 7.0 (GLOVE) IMPLANT
GLOVE BIOGEL PI INDICATOR 7.0 (GLOVE)
GLOVE EXAM NITRILE XL STR (GLOVE) IMPLANT
GLOVE INDICATOR 8.5 STRL (GLOVE) ×4 IMPLANT
GOWN STRL REUS W/ TWL LRG LVL3 (GOWN DISPOSABLE) IMPLANT
GOWN STRL REUS W/ TWL XL LVL3 (GOWN DISPOSABLE) ×2 IMPLANT
GOWN STRL REUS W/TWL 2XL LVL3 (GOWN DISPOSABLE) IMPLANT
GOWN STRL REUS W/TWL LRG LVL3 (GOWN DISPOSABLE)
GOWN STRL REUS W/TWL XL LVL3 (GOWN DISPOSABLE) ×4
GRAFT BONE PROTEIOS MED 2.5CC (Orthopedic Implant) ×2 IMPLANT
HEMOSTAT POWDER KIT SURGIFOAM (HEMOSTASIS) IMPLANT
KIT BASIN OR (CUSTOM PROCEDURE TRAY) ×2 IMPLANT
KIT TURNOVER KIT B (KITS) ×2 IMPLANT
MILL MEDIUM DISP (BLADE) ×2 IMPLANT
NEEDLE HYPO 21X1.5 SAFETY (NEEDLE) ×2 IMPLANT
NEEDLE HYPO 25X1 1.5 SAFETY (NEEDLE) ×2 IMPLANT
NS IRRIG 1000ML POUR BTL (IV SOLUTION) ×2 IMPLANT
OIL CARTRIDGE MAESTRO DRILL (MISCELLANEOUS) ×2
PACK LAMINECTOMY NEURO (CUSTOM PROCEDURE TRAY) ×2 IMPLANT
PAD ARMBOARD 7.5X6 YLW CONV (MISCELLANEOUS) ×6 IMPLANT
ROD 40MM SPINAL (Rod) ×2 IMPLANT
ROD 75MM SPINAL (Rod) ×2 IMPLANT
SCREW PA THRD CREO TULIP 5.5X4 (Head) ×4 IMPLANT
SHAFT CREO 30MM (Neuro Prosthesis/Implant) ×4 IMPLANT
SPACER SUSTAIN TI 8X22X210 8D (Spacer) ×4 IMPLANT
SPONGE LAP 4X18 RFD (DISPOSABLE) IMPLANT
SPONGE SURGIFOAM ABS GEL 100 (HEMOSTASIS) ×2 IMPLANT
STRIP CLOSURE SKIN 1/2X4 (GAUZE/BANDAGES/DRESSINGS) ×2 IMPLANT
SUT VIC AB 0 CT1 18XCR BRD8 (SUTURE) ×1 IMPLANT
SUT VIC AB 0 CT1 8-18 (SUTURE) ×2
SUT VIC AB 2-0 CT1 18 (SUTURE) ×4 IMPLANT
SUT VIC AB 4-0 PS2 27 (SUTURE) ×2 IMPLANT
SYR 20ML LL LF (SYRINGE) IMPLANT
TOWEL GREEN STERILE (TOWEL DISPOSABLE) ×2 IMPLANT
TOWEL GREEN STERILE FF (TOWEL DISPOSABLE) ×2 IMPLANT
TRAY FOLEY MTR SLVR 16FR STAT (SET/KITS/TRAYS/PACK) ×2 IMPLANT
TULIP CREP AMP 5.5MM (Orthopedic Implant) IMPLANT
WATER STERILE IRR 1000ML POUR (IV SOLUTION) ×2 IMPLANT

## 2019-12-22 NOTE — Anesthesia Postprocedure Evaluation (Signed)
Anesthesia Post Note  Patient: Lauren Briggs  Procedure(s) Performed: Posteiror Lumbar Interbody Fusion - Lumbar One- Lumbar Two with removal of hardware Lumbar Two-Three (N/A Back)     Patient location during evaluation: PACU Anesthesia Type: General Level of consciousness: awake Pain management: pain level controlled Vital Signs Assessment: post-procedure vital signs reviewed and stable Respiratory status: spontaneous breathing and respiratory function stable Cardiovascular status: stable Postop Assessment: no apparent nausea or vomiting Anesthetic complications: no   No complications documented.  Last Vitals:  Vitals:   12/22/19 1233 12/22/19 1248  BP: (!) 146/62 (!) 148/66  Pulse: 80 82  Resp: 13 11  Temp:  (!) 36.2 C  SpO2: 93% 93%    Last Pain:  Vitals:   12/22/19 1233  TempSrc:   PainSc: 4                  Candra R Oaklynn Stierwalt

## 2019-12-22 NOTE — Op Note (Signed)
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  Postoperative diagnosis: Same  Procedure: #1 exploration fusion move of hardware L2-3 with removal of bilateral knots and rods with removal of left L3 globus Creo cortical screw  2.  Complete decompressive laminectomy L1-L2 with complete medial facetectomies radical retinotomies of the L1 and L2 nerve roots  3.  Posterior lumbar interbody fusion L1-L2 utilizing the globus titanium insert and rotate tight cages packed with locally harvested autograft mixed with vivigen Proteios  4.  Placement of new L1 cortical screws under fluoroscopy tying into previously placed L2 cortical screws and L2-3 on the right  5.  Posterior lateral arthrodesis L1-L3  Surgeon: Dominica Severin Deangleo Passage  Assistant: Nash Shearer  Anesthesia: General  EBL: Less than 86  HPI: 64 year old female with longstanding history with back previous L2-S1 fusions over successive operations presented with progressive worsening back bilateral SI joint hip pain work-up revealed segmental degeneration above her previous fusion at L2-3 and severe stenosis with a grade 1 spondylolisthesis at L1-L2.  Due to patient's progressive clinical syndrome imaging findings and failed conservative treatment I recommended decompression interbody fusion at L1-L2.  I extensively went over the risks and benefits of that procedure with her as well as perioperative course expectations of outcome and alternatives of surgery and she understood and agreed to proceed forward.  Operative procedure: Patient was brought into the OR was due to general anesthesia positioned prone the Wilson frame her back was prepped and draped in routine sterile fashion.  Her old incision was infiltrated and extended slightly cephalad scar tissue and subperiosteal dissection was carried lamina  exposing the old hardware at L2-3 and exposing the cortical screw entry point at L1.  I disconnected the rods and knots the fusion did appear to be solid I then remove the spinous process at L1 performed a central decompression there was marked hourglass compression of thecal sac and all this was removed as I performed complete medial facetectomies and radical retinotomies of the L1 and L2 nerve root.  A large disc radiation on the left under the facet joint and foraminal was immediately identified the disc base was incised after the epidural epidural veins were coagulated.  The space was cleaned out bilaterally both centrally and laterally removing large fragments of disc in the lateral compartment of both both sides.  Then after adequate endplate preparation utilizing sequential distraction with a 10 distractor in place I selected an 8-10 titanium cage packed with locally harvested autograft mixed.  Then placed the right-sided cage packed extensive mount of autograft centrally in place the left-sided cage all under fluoroscopy.  Then under fluoroscopy with AP and lateral projections I placed 2 cortical screws at L1 both screws excellent purchase postop fluoroscopy confirmed good position of all the implants.  I then fashion to rods and the left side L3 screw was significantly laterally displaced compared to the L1-L2 screw I decided to remove the left L3 screw as she was displaying a solid fusion.  I did leave the screw on the right at L3.  I then aggressively decorticated the lateral facet joints from L1 down to L3 and placed the remainder of the autograft mix posterior laterally on the left.  After this to the L1 screw was compressed against L2 all the knots were anchored in place Gelfoam was ON top the dura after reinspecting all the foramina to confirm patency then the wound  was closed after placement of a medium Hemovac drain.  With interrupted Vicryl in the fascia and subcuticular and injected Exparel in the  fascia and the skin was closed running 4 subcuticular Dermabond benzoin Steri-Strips and sterile dressing was applied patient recovery room in stable condition.  At the end the case all needle counts and sponge counts were correct.

## 2019-12-22 NOTE — Evaluation (Addendum)
Physical Therapy Evaluation Patient Details Name: Lauren Briggs MRN: [id number] DOB: June 22, 1955 Today's Date: 12/22/2019   History of Present Illness  Patient is a 64 y/o female with progressive worsening back and R hip pain with hx of previous back surgeries since 2009. Patient s/p decompressive laminectomy and interbody fusion at L1-2 with exploration fusion move of hardware L2-3. PMH includes HLD, HTN, neuromuscular disorder, prediabetes, arthritis.   Clinical Impression  Prior to surgery, patient lives with husband and was independent with all mobility. Session limited by lethargy due to medications. Patient presents with decreased activity tolerance, impaired balance, generalized weakness. Patient ambulated 200' with IV pole and min guard for safety. Patient will benefit from skilled PT services to address listed deficits. Anticipate no PT follow up following discharge at this time.     Follow Up Recommendations No PT follow up    Equipment Recommendations  None recommended by PT    Recommendations for Other Services       Precautions / Restrictions Precautions Precautions: Back;Fall Precaution Booklet Issued: Yes (comment) Precaution Comments: hemovac drain Required Braces or Orthoses: Spinal Brace Spinal Brace: Lumbar corset Restrictions Weight Bearing Restrictions: No      Mobility  Bed Mobility Overal bed mobility: Needs Assistance Bed Mobility: Supine to Sit;Sit to Supine     Supine to sit: Modified independent (Device/Increase time) Sit to supine: Modified independent (Device/Increase time)        Transfers Overall transfer level: Needs assistance Equipment used: None Transfers: Sit to/from Stand Sit to Stand: Min guard            Ambulation/Gait Ambulation/Gait assistance: Min guard Gait Distance (Feet): 200 Feet Assistive device: IV Pole Gait Pattern/deviations: Step-through pattern;Decreased stride length Gait velocity: decreased       Stairs            Wheelchair Mobility    Modified Rankin (Stroke Patients Only)       Balance Overall balance assessment: Mild deficits observed, not formally tested                                           Pertinent Vitals/Pain Pain Assessment: Faces Faces Pain Scale: Hurts a little bit Pain Location: low back Pain Descriptors / Indicators: Grimacing;Guarding Pain Intervention(s): Monitored during session    Home Living Family/patient expects to be discharged to:: Private residence Living Arrangements: Spouse/significant other Available Help at Discharge: Family;Available 24 hours/day Type of Home: House Home Access: Stairs to enter Entrance Stairs-Rails: Right;Left;Can reach both Entrance Stairs-Number of Steps: 4 Home Layout: Two level (lives on main level) Home Equipment: Kasandra Knudsen - single point      Prior Function Level of Independence: Independent               Hand Dominance        Extremity/Trunk Assessment   Upper Extremity Assessment Upper Extremity Assessment: Overall WFL for tasks assessed    Lower Extremity Assessment Lower Extremity Assessment: Generalized weakness       Communication   Communication: No difficulties  Cognition Arousal/Alertness: Lethargic;Suspect due to medications Behavior During Therapy: [facility] for tasks assessed/performed Overall Cognitive Status: Within Functional Limits for tasks assessed  General Comments General comments (skin integrity, edema, etc.): husband present    Exercises     Assessment/Plan    PT Assessment Patient needs continued PT services  PT Problem List Decreased strength;Decreased activity tolerance;Decreased balance;Decreased mobility       PT Treatment Interventions Gait training;Stair training;Functional mobility training;Therapeutic activities;Therapeutic exercise;Balance training;Patient/family education     PT Goals (Current goals can be found in the Care Plan section)  Acute Rehab PT Goals Patient Stated Goal: to go home PT Goal Formulation: With patient/family Time For Goal Achievement: 12/29/19 Potential to Achieve Goals: Good    Frequency Min 5X/week   Barriers to discharge        Co-evaluation               AM-PAC PT "6 Clicks" Mobility  Outcome Measure Help needed turning from your back to your side while in a flat bed without using bedrails?: None Help needed moving from lying on your back to sitting on the side of a flat bed without using bedrails?: None Help needed moving to and from a bed to a chair (including a wheelchair)?: None Help needed standing up from a chair using your arms (e.g., wheelchair or bedside chair)?: A Little Help needed to walk in hospital room?: A Little Help needed climbing 3-5 steps with a railing? : A Little 6 Click Score: 21    End of Session Equipment Utilized During Treatment: Gait belt;Back brace Activity Tolerance: Patient limited by lethargy Patient left: in bed;with call bell/phone within reach;with family/visitor present;with nursing/sitter in room Nurse Communication: Mobility status PT Visit Diagnosis: Unsteadiness on feet (R26.81);Muscle weakness (generalized) (M62.81)    Time: 8875-7972 PT Time Calculation (min) (ACUTE ONLY): 26 min   Charges:   PT Evaluation $PT Eval Low Complexity: 1 Low PT Treatments $Therapeutic Activity: 8-22 mins        Perrin Maltese, PT, DPT Acute Rehabilitation Services Pager [phone] Office [phone]   Lauren Briggs 12/22/2019, 3:34 PM

## 2019-12-22 NOTE — Anesthesia Procedure Notes (Signed)
Procedure Name: Intubation Date/Time: 12/22/2019 8:34 AM Performed by: Lieutenant Diego, CRNA Pre-anesthesia Checklist: Patient identified, Emergency Drugs available, Suction available and Patient being monitored Patient Re-evaluated:Patient Re-evaluated prior to induction Oxygen Delivery Method: Circle system utilized Preoxygenation: Pre-oxygenation with 100% oxygen Induction Type: IV induction Ventilation: Mask ventilation without difficulty Laryngoscope Size: Glidescope Grade View: Grade I Tube type: Oral Tube size: 7.0 mm Number of attempts: 1 Airway Equipment and Method: Stylet Placement Confirmation: ETT inserted through vocal cords under direct vision,  positive ETCO2 and breath sounds checked- equal and bilateral Secured at: 20 cm Tube secured with: Tape Dental Injury: Teeth and Oropharynx as per pre-operative assessment  Difficulty Due To: Difficulty was anticipated, Difficult Airway- due to large tongue, Difficult Airway- due to reduced neck mobility, Difficult Airway- due to limited oral opening and Difficult Airway- due to dentition

## 2019-12-22 NOTE — H&P (Signed)
Lauren Briggs is an 64 y.o. female.   Chief Complaint: Back and right hip pain HPI: 64 year old female with progressive worsening back and right hip pain work-up has revealed segmental degeneration above her previous fusion at L2-3.  Due to her progression of clinical syndrome imaging findings and failed conservative treatment I recommended decompressive laminectomy and interbody fusion at L1-L2.  With an exploration fusion move of hardware L2-3.  I have extensively gone over the risks and benefits of the procedure with her as well as perioperative course expectations of outcome and alternatives to surgery and she understands and agrees to proceed forward.  Past Medical History:  Diagnosis Date  . Allergy   . Arthritis   . Back pain   . Complication of anesthesia    HX Back CHIPPED TOOTH S/P EXTUBATION   . Hyperlipidemia   . Hypertension   . Low back pain radiating to right leg   . 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 Neuromuscular disorder ([place])   . Pre-diabetes   . Prediabetes 11/10/2018  . Shortness of breath dyspnea     Past Surgical History:  Procedure Laterality Date  . ABDOMINAL HYSTERECTOMY     PARTIAL  . ANTERIOR LAT LUMBAR FUSION N/A 08/14/2018   Procedure: extreme lateral interbody fusion - Lumbar two-Lumbar three  cortical screws and removal pedicle screws Lumbar -Sacral one;  Surgeon: Kary Kos, MD;  Location: [place];  Service: Neurosurgery;  Laterality: N/A;  . APPENDECTOMY    . BACK SURGERY     times 4  . CARPAL TUNNEL RELEASE     BIL   . CESAREAN SECTION     PARTIAL HYST  . CLOSED REDUCTION ULNAR SHAFT    . COLONOSCOPY    . FOOT SURGERY     LT FOOT CYST REMOVED   . HARDWARE REMOVAL N/A 08/14/2018   Procedure: Removal pedicle screws Lumbar -Sacral one with cortical screw fixation lumbar two-three;  Surgeon: Kary Kos, MD;  Location: [place];  Service: Neurosurgery;  Laterality: N/A;  . NASAL SINUS SURGERY     1994   . radial head Right  01/14/2016   at [facility]  . RADIAL HEAD ARTHROPLASTY Right 01/26/2016   Procedure: RIGHT RADIAL HEAD REPLACEMENT ARTHROPLASTY WITH LIGAMENT REPAIRS;  Surgeon: Charlotte Crumb, MD;  Location: [place];  Service: Orthopedics;  Laterality: Right;  Right elbow raial head replacement with possible ligament repairs as needed   . UPPER GASTROINTESTINAL ENDOSCOPY      Family History  Problem Relation Age of Onset  . Dementia Mother   . Heart attack Mother   . Hypertension Mother   . Heart disease Mother   . Hyperlipidemia Mother   . Heart attack Father 65  . Heart disease Father   . Sudden death Father   . Hyperlipidemia Father   . Heart attack Maternal Grandmother 91  . Stroke Maternal Grandmother   . Colon cancer Neg Hx   . Colon polyps Neg Hx   . Esophageal cancer Neg Hx   . Stomach cancer Neg Hx   . Rectal cancer Neg Hx    Social History:  reports that she quit smoking about 40 years ago. Her smoking use included cigarettes. She quit after 3.00 years of use. She quit smokeless tobacco use about 40 years ago. She reports current alcohol use. She reports that she does not use drugs.  Allergies:  Allergies  Allergen Reactions  . Iodine Nausea And Vomiting    During a  myelogram   . Propoxyphene Hcl Palpitations    darvon     Medications Prior to Admission  Medication Sig Dispense Refill  . acetaminophen (TYLENOL) 500 MG tablet Take 1,000 mg by mouth every 8 (eight) hours as needed for moderate pain or headache.    . Coenzyme Q10 (COQ-10) 100 MG CAPS Take 100 mg by mouth in the morning and at bedtime. otc 30 capsule 0  . DULoxetine (CYMBALTA) 60 MG capsule Take 60 mg by mouth at bedtime.     Marland Kitchen estradiol (ESTRACE) 1 MG tablet Take 1 mg by mouth every morning.     . Estradiol (VAGIFEM) 10 MCG TABS vaginal tablet Place 10 mcg vaginally every Monday, Wednesday, and Friday.    . ezetimibe (ZETIA) 10 MG tablet Take 1 tablet (10 mg total) by mouth daily. 90 tablet 3  .  fenofibrate micronized (LOFIBRA) 134 MG capsule Take 1 capsule (134 mg total) by mouth daily before breakfast. 90 capsule 3  . fexofenadine (ALLEGRA) 180 MG tablet Take 180 mg by mouth daily.    Marland Kitchen HYDROcodone-acetaminophen (NORCO) 10-325 MG tablet Take 1 tablet by mouth every 4 (four) hours as needed for moderate pain.     Marland Kitchen icosapent Ethyl (VASCEPA) 1 g capsule TAKE 2 CAPSULES(2 GRAMS) BY MOUTH TWICE DAILY (Patient taking differently: Take 2 g by mouth 2 (two) times daily. ) 360 capsule 3  . Multiple Vitamin (MULTIVITAMIN WITH MINERALS) TABS Take 1 tablet by mouth daily after supper. Women's 50 plus    . simvastatin (ZOCOR) 20 MG tablet TAKE 1 TABLET(20 MG) BY MOUTH AT BEDTIME (Patient taking differently: Take 20 mg by mouth at bedtime. TAKE 1 TABLET(20 MG) BY MOUTH AT BEDTIME) 90 tablet 3  . lisinopril-hydrochlorothiazide (ZESTORETIC) 20-12.5 MG tablet TAKE 1 TABLET BY MOUTH DAILY 90 tablet 1    Results for orders placed or performed during the hospital encounter of 12/20/19 (from the past 48 hour(s))  SARS CORONAVIRUS 2 (TAT 6-24 HRS) Nasopharyngeal Nasopharyngeal Swab     Status: None   Collection Time: 12/20/19 11:33 AM   Specimen: Nasopharyngeal Swab  Result Value Ref Range   SARS Coronavirus 2 NEGATIVE NEGATIVE    Comment: (NOTE) SARS-CoV-2 target nucleic acids are NOT DETECTED.  The SARS-CoV-2 RNA is generally detectable in upper and lower respiratory specimens during the acute phase of infection. Negative results do not preclude SARS-CoV-2 infection, do not rule out co-infections with other pathogens, and should not be used as the sole basis for treatment or other patient management decisions. Negative results must be combined with clinical observations, patient history, and epidemiological information. The expected result is Negative.  Fact Sheet for Patients: SugarRoll.be  Fact Sheet for Healthcare  Providers: https://www.woods-mathews.com/  This test is not yet approved or cleared by the [place] FDA and  has been authorized for detection and/or diagnosis of SARS-CoV-2 by FDA under an Emergency Use Authorization (EUA). This EUA will remain  in effect (meaning this test can be used) for the duration of the COVID-19 declaration under Se ction 564(b)(1) of the Act, 21 U.S.C. section 360bbb-3(b)(1), unless the authorization is terminated or revoked sooner.  Performed at [facility], [place] [address] [place], [place] [address]    No results found.  Review of Systems  Musculoskeletal: Positive for back pain and myalgias.  Neurological: Positive for numbness.    Blood pressure (!) 176/73, pulse 88, temperature 97.9 F (36.6 C), temperature source Oral, resp. rate 18, SpO2 98 %. Physical Exam  HENT:     Head: Normocephalic.     Right Ear: Tympanic membrane normal.     Nose: Nose normal.     Mouth/Throat:     Mouth: Mucous membranes are moist.  Eyes:     Pupils: Pupils are equal, round, and reactive to light.  Cardiovascular:     Rate and Rhythm: Normal rate.     Pulses: Normal pulses.  Pulmonary:     Effort: Pulmonary effort is normal.  Abdominal:     General: Abdomen is flat.  Musculoskeletal:        General: Normal range of motion.     Cervical back: Normal range of motion.  Skin:    General: Skin is warm.     Capillary Refill: Capillary refill takes less than 2 seconds.  Neurological:     General: No focal deficit present.     Mental Status: She is alert.     Comments: Strength is 5/5 iliopsoas, quads, hamstrings, gastrocs, into tibialis, and EHL.  Psychiatric:        Mood and Affect: Mood normal.      Assessment/Plan 64 year old presents for decompressive laminectomy interbody fusion L1-L2  Elaina Hoops, MD 12/22/2019, 7:46 AM

## 2019-12-22 NOTE — Progress Notes (Signed)
Orthopedic Tech Progress Note Patient Details:  Lauren Briggs 1955/08/22 [id number] Patient has brace Patient ID: Lauren Briggs, female   DOB: [birth year], 64 y.o.   MRN: [id number]   Lauren Briggs 12/22/2019, 1:54 PM

## 2019-12-22 NOTE — Progress Notes (Signed)
Chief Complaint:   OBESITY Lauren Briggs is here to discuss her progress with her obesity treatment plan along with follow-up of her obesity related diagnoses.   Today's visit was #: 13 Starting weight: 234 lbs Starting date: 03/02/2019 Today's weight: 234 lbs Today's date: 12/21/2019 Total lbs lost to date: 0 Body mass index is 40.17 kg/m.   Interim History: Lauren Briggs says she enjoyed her trip to [place].  She did not focus on her eating plan.  She is scheduled for lumbar surgery tomorrow.  She says she feels ready. Nutrition Plan: the Category 3 Plan for 0% of the time.  Activity: None at this time.  Assessment/Plan:   1. Lumbar pain Lauren Briggs will be having lumbar fusion surgery tomorrow.  Discussed nutrition after surgery, focusing on prevention of constipation, maintaining hydration, and maintaining protein for healing.  2. Metabolic syndrome Starting goal: Lose 7-10% of starting weight. She will continue to focus on protein-rich, low simple carbohydrate foods. We reviewed the importance of hydration, regular exercise for stress reduction, and restorative sleep.  We will continue to check lab work every 3 months, with 10% weight loss, or should any other concerns arise.  Visceral fat rating is 16.  The 10-year ASCVD risk score Lauren Briggs DC Brooke Bonito., et al., 2013) is: 8.1%   Values used to calculate the score:     Age: 64 years     Sex: Female     Is Non-Hispanic African American: No     Diabetic: No     Tobacco smoker: No     Systolic Blood Pressure: 824 mmHg     Is BP treated: No     HDL Cholesterol: 78 mg/dL     Total Cholesterol: 215 mg/dL  3. Essential hypertension Not at goal. Medications: lisinopril-HCTZ 20-12.5 mg daily.   Plan: Avoid buying foods that are: processed, frozen, or prepackaged to avoid excess salt. We will continue to monitor symptoms as they relate to her weight loss journey.  BP Readings from Last 3 Encounters:  12/22/19 (!) 176/73  12/21/19 (!) 153/75   12/20/19 (!) 151/78   Lab Results  Component Value Date   CREATININE 0.73 12/20/2019   4. Class 3 severe obesity with serious comorbidity and body mass index (BMI) of 40.0 to 44.9 in adult, unspecified obesity type Lauren Briggs)  Course: Lauren Briggs is currently in the action stage of change. As such, her goal is to continue with weight loss efforts.   Nutrition goals: She has agreed to practicing portion control and making smarter food choices, such as increasing vegetables and decreasing simple carbohydrates.   Exercise goals: Per neurosurgeon.  Behavioral modification strategies: increasing lean protein intake, decreasing simple carbohydrates, increasing vegetables, increasing water intake, decreasing sodium intake and increasing high fiber foods.  Lauren Briggs has agreed to follow-up with our clinic in 4 weeks. She was informed of the importance of frequent follow-up visits to maximize her success with intensive lifestyle modifications for her multiple health conditions.   Objective:   Blood pressure (!) 153/75, pulse 88, temperature 98.1 F (36.7 C), temperature source Oral, height 5\' 4"  (1.626 m), weight 234 lb (106.1 kg), SpO2 97 %. Body mass index is 40.17 kg/m.  General: Cooperative, alert, well developed, in no acute distress. HEENT: Conjunctivae and lids unremarkable. Cardiovascular: Regular rhythm.  Lungs: Normal work of breathing. Neurologic: No focal deficits.   Lab Results  Component Value Date   CREATININE 0.73 12/20/2019   BUN 25 (H) 12/20/2019   NA 138 12/20/2019  K 4.1 12/20/2019   CL 103 12/20/2019   CO2 26 12/20/2019   Lab Results  Component Value Date   ALT 22 03/02/2019   AST 24 03/02/2019   ALKPHOS 60 03/02/2019   BILITOT 0.3 03/02/2019   Lab Results  Component Value Date   HGBA1C 6.1 (H) 06/14/2019   HGBA1C 6.3 (H) 03/02/2019   HGBA1C 6.3 (H) 11/09/2018   Lab Results  Component Value Date   INSULIN 8.6 03/02/2019   Lab Results  Component Value Date    TSH 1.420 03/02/2019   Lab Results  Component Value Date   CHOL 215 (H) 11/29/2019   HDL 78 11/29/2019   LDLCALC 112 (H) 11/29/2019   TRIG 143 11/29/2019   CHOLHDL 2.8 11/29/2019   Lab Results  Component Value Date   WBC 9.0 12/20/2019   HGB 13.2 12/20/2019   HCT 41.5 12/20/2019   MCV 95.8 12/20/2019   PLT 356 12/20/2019   Lab Results  Component Value Date   IRON 91 03/02/2019   TIBC 480 (H) 03/02/2019   FERRITIN 101 03/02/2019   Attestation Statements:   Reviewed by clinician on day of visit: allergies, medications, problem list, medical history, surgical history, family history, social history, and previous encounter notes.  I, Water quality scientist, CMA, am acting as transcriptionist for Briscoe Deutscher, DO  I have reviewed the above documentation for accuracy and completeness, and I agree with the above. Briscoe Deutscher, DO

## 2019-12-22 NOTE — Transfer of Care (Signed)
Immediate Anesthesia Transfer of Care Note  Patient: Lauren Briggs  Procedure(s) Performed: Posteiror Lumbar Interbody Fusion - Lumbar One- Lumbar Two with removal of hardware Lumbar Two-Three (N/A Back)  Patient Location: PACU  Anesthesia Type:General  Level of Consciousness: awake  Airway & Oxygen Therapy: Patient Spontanous Breathing and Patient connected to face mask oxygen  Post-op Assessment: Report given to RN and Post -op Vital signs reviewed and stable  Post vital signs: Reviewed and stable  Last Vitals:  Vitals Value Taken Time  BP 163/72 12/22/19 1133  Temp    Pulse 87 12/22/19 1133  Resp 17 12/22/19 1133  SpO2 100 % 12/22/19 1133  Vitals shown include unvalidated device data.  Last Pain:  Vitals:   12/22/19 0705  TempSrc:   PainSc: 8       Patients Stated Pain Goal: 2 (84/03/35 3317)  Complications: No complications documented.

## 2019-12-23 MED ORDER — CYCLOBENZAPRINE HCL 10 MG PO TABS
10.0000 mg | ORAL_TABLET | Freq: Three times a day (TID) | ORAL | 0 refills | Status: DC | PRN
Start: 2019-12-23 — End: 2020-09-04

## 2019-12-23 MED ORDER — HYDROCODONE-ACETAMINOPHEN 10-325 MG PO TABS: 1.0000 | ORAL_TABLET | ORAL | 0 refills | Status: DC | PRN

## 2019-12-23 NOTE — Discharge Instructions (Signed)

## 2019-12-23 NOTE — Progress Notes (Signed)
Patient is discharged from room 3C04 at this time. Alert and in stable condition. IV site d/c'd and instructions read to patient and spouse with understanding verbalized and all questions answered. Left unit via wheelchair with all belongings at side. 

## 2019-12-23 NOTE — Discharge Summary (Signed)
Physician Discharge Summary  Patient ID: Lauren Briggs MRN: [id number] DOB/AGE: 1955/12/03 64 y.o. Estimated body mass index is 40.17 kg/m as calculated from the following:   Height as of 12/21/19: 5\' 4"  (1.626 m).   Weight as of 12/21/19: 106.1 kg.   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  Consults: Significant Diagnostic Studies: Treatments: Fusion L1-L2 Discharge Exam: Blood pressure (!) 150/50, pulse 80, temperature 98 F (36.7 C), temperature source Oral, resp. rate 17, SpO2 100 %. Strength out of 5 wound clean dry and intact  Disposition: Home  Discharge Instructions     Remove dressing in 72 hours   Complete by: As directed    Call MD for:  difficulty breathing, headache or visual disturbances   Complete by: As directed    Call MD for:  hives   Complete by: As directed    Call MD for:  persistant dizziness or light-headedness   Complete by: As directed    Call MD for:  persistant nausea and vomiting   Complete by: As directed    Call MD for:  redness, tenderness, or signs of infection (pain, swelling, redness, odor or green/yellow discharge around incision site)   Complete by: As directed    Call MD for:  severe uncontrolled pain   Complete by: As directed    Call MD for:  temperature >100.4   Complete by: As directed    Diet - low sodium heart healthy   Complete by: As directed    Driving Restrictions   Complete by: As directed    No driving for 2 weeks, no riding in the car for 1 week   Increase activity slowly    Complete by: As directed    Lifting restrictions   Complete by: As directed    No lifting more than 8 lbs     Allergies as of 12/23/2019      Reactions   Iodine Nausea And Vomiting   During a myelogram    Propoxyphene Hcl Palpitations   darvon      Medication List    TAKE these medications   acetaminophen 500 MG tablet Commonly known as: TYLENOL Take 1,000 mg by mouth every 8 (eight) hours as needed for moderate pain or headache.   CoQ-10 100 MG Caps Take 100 mg by mouth in the morning and at bedtime. otc   cyclobenzaprine 10 MG tablet Commonly known as: FLEXERIL Take 1 tablet (10 mg total) by mouth 3 (three) times daily as needed for muscle spasms.   DULoxetine 60 MG capsule Commonly known as: CYMBALTA Take 60 mg by mouth at bedtime.   estradiol 1 MG tablet Commonly known as: ESTRACE Take 1 mg by mouth every morning.   ezetimibe 10 MG tablet Commonly known as: ZETIA TAKE 1 TABLET(10 MG) BY MOUTH DAILY What changed: See the new instructions.   fenofibrate micronized 134 MG capsule Commonly known as: LOFIBRA Take 1 capsule (134 mg total) by mouth daily before breakfast.   fexofenadine 180 MG tablet Commonly known as: ALLEGRA Take 180 mg by mouth daily.   HYDROcodone-acetaminophen 10-325 MG tablet Commonly known as: NORCO Take 1  tablet by mouth every 4 (four) hours as needed for moderate pain.   icosapent Ethyl 1 g capsule Commonly known as: Vascepa TAKE 2 CAPSULES(2 GRAMS) BY MOUTH TWICE DAILY What changed:   how much to take  how to take this  when to take this  additional instructions   lisinopril-hydrochlorothiazide 20-12.5 MG tablet Commonly known as: ZESTORETIC TAKE 1 TABLET BY MOUTH DAILY   multivitamin with minerals Tabs tablet Take 1 tablet by mouth daily after supper. Women's 50 plus   simvastatin 20 MG tablet Commonly known as: ZOCOR TAKE 1 TABLET(20 MG) BY MOUTH AT BEDTIME What changed:   how much to take  how to take  this  when to take this   Vagifem 10 MCG Tabs vaginal tablet Generic drug: Estradiol Place 10 mcg vaginally every Monday, Wednesday, and Friday.        Signed: Elaina Hoops 12/23/2019, 8:09 AM

## 2019-12-23 NOTE — Progress Notes (Signed)
Physical Therapy Treatment Patient Details Name: Lauren Briggs MRN: [id number] DOB: 1955/04/01 Today's Date: 12/23/2019    History of Present Illness Patient is a 64 y/o female with progressive worsening back and R hip pain with hx of previous back surgeries since 2009. Patient s/p decompressive laminectomy and interbody fusion at L1-2 with exploration fusion move of hardware L2-3. PMH includes HLD, HTN, neuromuscular disorder, prediabetes, arthritis.     PT Comments    Pt progressing well with post-op mobility. She3 was able to demonstrate transfers and ambulation with gross modified independence and no AD. Pt was educated on precautions, brace application/wearing schedule, appropriate activity progression, and car transfer. Will continue to follow.      Follow Up Recommendations  No PT follow up     Equipment Recommendations  None recommended by PT    Recommendations for Other Services       Precautions / Restrictions Precautions Precautions: Back;Fall Precaution Booklet Issued: Yes (comment) Precaution Comments: hemovac drain Required Braces or Orthoses: Spinal Brace Spinal Brace: Lumbar corset Restrictions Weight Bearing Restrictions: No    Mobility  Bed Mobility Overal bed mobility: Modified Independent Bed Mobility: Rolling;Sidelying to Sit;Sit to Sidelying           General bed mobility comments: HOB slightly elevated to simulate home environment, and rails lowered.   Transfers Overall transfer level: Modified independent Equipment used: None Transfers: Sit to/from Stand           General transfer comment: No unsteadiness noted.   Ambulation/Gait Ambulation/Gait assistance: Modified independent (Device/Increase time) Gait Distance (Feet): 350 Feet Assistive device: None Gait Pattern/deviations: Step-through pattern;Decreased stride length Gait velocity: decreased Gait velocity interpretation: 1.31 - 2.62 ft/sec, indicative of limited community  ambulator General Gait Details: Ambulating well without unsteadiness or LOB.    Stairs             Wheelchair Mobility    Modified Rankin (Stroke Patients Only)       Balance Overall balance assessment: Mild deficits observed, not formally tested                                          Cognition Arousal/Alertness: Awake/alert Behavior During Therapy: WFL for tasks assessed/performed Overall Cognitive Status: Within Functional Limits for tasks assessed                                        Exercises      General Comments        Pertinent Vitals/Pain Pain Assessment: Faces Faces Pain Scale: Hurts a little bit Pain Location: low back Pain Descriptors / Indicators: Grimacing;Guarding Pain Intervention(s): Monitored during session;Limited activity within patient's tolerance;Repositioned    Home Living Family/patient expects to be discharged to:: Private residence Living Arrangements: Spouse/significant other Available Help at Discharge: Family;Available 24 hours/day Type of Home: House Home Access: Stairs to enter Entrance Stairs-Rails: Right;Left;Can reach both Home Layout: Two level (lives on main level) Home Equipment: Kasandra Knudsen - single point      Prior Function Level of Independence: Independent          PT Goals (current goals can now be found in the care plan section) Acute Rehab PT Goals Patient Stated Goal: to go home PT Goal Formulation: With patient/family Time For Goal Achievement: 12/29/19 Potential to Achieve  Goals: Good    Frequency    Min 5X/week      PT Plan      Co-evaluation              AM-PAC PT "6 Clicks" Mobility   Outcome Measure  Help needed turning from your back to your side while in a flat bed without using bedrails?: None Help needed moving from lying on your back to sitting on the side of a flat bed without using bedrails?: None Help needed moving to and from a bed to a  chair (including a wheelchair)?: None Help needed standing up from a chair using your arms (e.g., wheelchair or bedside chair)?: None Help needed to walk in hospital room?: None Help needed climbing 3-5 steps with a railing? : A Little 6 Click Score: 23    End of Session Equipment Utilized During Treatment: Back brace Activity Tolerance: Patient tolerated treatment well Patient left: in bed;with call bell/phone within reach;with family/visitor present;with nursing/sitter in room Nurse Communication: Mobility status PT Visit Diagnosis: Unsteadiness on feet (R26.81);Muscle weakness (generalized) (M62.81)     Time: 4037-5436 PT Time Calculation (min) (ACUTE ONLY): 12 min  Charges:  $Gait Training: 8-22 mins                     Rolinda Roan, PT, DPT Acute Rehabilitation Services Pager: [phone] Office: [phone]    Thelma Comp 12/23/2019, 3:16 PM

## 2019-12-23 NOTE — Discharge Summary (Signed)
Physician Discharge Summary  Patient ID: Lauren Briggs MRN: [id number] DOB/AGE: [birth year] 64 y.o.  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     Discharge Diagnoses: same   Discharged Condition: good  Hospital Course: The patient was admitted on 12/22/2019 and taken to the operating room where the patient underwent PLIF L1-L2. The patient tolerated the procedure well and was taken to the recovery room and then to the floor in stable condition. The hospital course was routine. There were no complications. The wound remained clean dry and intact. Pt had appropriate back soreness. No complaints of leg pain or new N/T/W. The patient remained afebrile with stable vital signs, and tolerated a regular diet. The patient continued to increase activities, and pain was well controlled with oral pain medications.   Consults: None  Significant Diagnostic Studies:  Results for orders placed or performed during the hospital encounter of 12/22/19  Glucose, capillary  Result Value Ref Range   Glucose-Capillary 116 (H) 70 - 99 mg/dL    DG Lumbar Spine 2-3 Views  Result Date: 12/22/2019 CLINICAL DATA:  Elective surgery. Additional history provided: Posterior lumbar interbody fusion lumbar 1-lumbar 2 with removal of hardware lumbar 2-3. Provided fluoroscopy time 40 seconds (36.48 mGy). EXAM: LUMBAR SPINE - 2-3 VIEW; DG C-ARM 1-60 MIN COMPARISON:  Lumbar spine MRI 10/30/2019. Radiographs of the lumbar spine 10/26/2019. Lumbar CT myelogram 05/14/2018. FINDINGS: PA and lateral view intraoperative fluoroscopic images of the lumbar spine are submitted, 2 images total. The images demonstrate bilateral pedicle screws at L1 and L2. An interbody device is also now present at L1-L2. Bilateral pedicle screws are present  at L3. An L2-L3 interbody device is also present. Partially imaged fusion hardware spanning the L4-L5 spinous processes. IMPRESSION: Two intraoperative fluoroscopic images of the lumbar spine, as described. Electronically Signed   By: Kellie Simmering DO   On: 12/22/2019 11:38   DG C-Arm 1-60 Min  Result Date: 12/22/2019 CLINICAL DATA:  Elective surgery. Additional history provided: Posterior lumbar interbody fusion lumbar 1-lumbar 2 with removal of hardware lumbar 2-3. Provided fluoroscopy time 40 seconds (36.48 mGy). EXAM: LUMBAR SPINE - 2-3 VIEW; DG C-ARM 1-60 MIN COMPARISON:  Lumbar spine MRI 10/30/2019. Radiographs of the lumbar spine 10/26/2019. Lumbar CT myelogram 05/14/2018. FINDINGS: PA and lateral view intraoperative fluoroscopic images of the lumbar spine are submitted, 2 images total. The images demonstrate bilateral pedicle screws at L1 and L2. An interbody device is also now present at L1-L2. Bilateral pedicle screws are present at L3. An L2-L3 interbody device is also present. Partially imaged fusion hardware spanning the L4-L5 spinous processes. IMPRESSION: Two intraoperative fluoroscopic images of the lumbar spine, as described. Electronically Signed   By: Kellie Simmering DO   On: 12/22/2019 11:38    Antibiotics:  Anti-infectives (From admission, onward)   Start     Dose/Rate Route Frequency Ordered Stop   12/22/19 1630  ceFAZolin (ANCEF) IVPB 2g/100 mL premix        2 g 200 mL/hr over 30 Minutes Intravenous Every 8 hours 12/22/19 1324 12/24/19 1629   12/22/19 0700  ceFAZolin (ANCEF) IVPB 2g/100 mL premix        2 g 200 mL/hr over 30 Minutes Intravenous On call to O.R. 12/22/19 0300 12/22/19 0838   12/22/19 0654  ceFAZolin (ANCEF) 2-4 GM/100ML-% IVPB       Note  to Pharmacy: Odelia Gage   : cabinet override      12/22/19 0654 12/22/19 0903      Discharge Exam: Blood pressure (!) 150/50, pulse 80, temperature 98 F (36.7 C), temperature source Oral, resp. rate 17, SpO2 100  %. Neurologic: Grossly normal Incision cdi, ambulating and voiding well  Discharge Medications:   Allergies as of 12/23/2019      Reactions   Iodine Nausea And Vomiting   During a myelogram    Propoxyphene Hcl Palpitations   darvon      Medication List    TAKE these medications   acetaminophen 500 MG tablet Commonly known as: TYLENOL Take 1,000 mg by mouth every 8 (eight) hours as needed for moderate pain or headache.   CoQ-10 100 MG Caps Take 100 mg by mouth in the morning and at bedtime. otc   cyclobenzaprine 10 MG tablet Commonly known as: FLEXERIL Take 1 tablet (10 mg total) by mouth 3 (three) times daily as needed for muscle spasms.   DULoxetine 60 MG capsule Commonly known as: CYMBALTA Take 60 mg by mouth at bedtime.   estradiol 1 MG tablet Commonly known as: ESTRACE Take 1 mg by mouth every morning.   ezetimibe 10 MG tablet Commonly known as: ZETIA TAKE 1 TABLET(10 MG) BY MOUTH DAILY What changed: See the new instructions.   fenofibrate micronized 134 MG capsule Commonly known as: LOFIBRA Take 1 capsule (134 mg total) by mouth daily before breakfast.   fexofenadine 180 MG tablet Commonly known as: ALLEGRA Take 180 mg by mouth daily.   HYDROcodone-acetaminophen 10-325 MG tablet Commonly known as: NORCO Take 1 tablet by mouth every 4 (four) hours as needed for moderate pain.   icosapent Ethyl 1 g capsule Commonly known as: Vascepa TAKE 2 CAPSULES(2 GRAMS) BY MOUTH TWICE DAILY What changed:   how much to take  how to take this  when to take this  additional instructions   lisinopril-hydrochlorothiazide 20-12.5 MG tablet Commonly known as: ZESTORETIC TAKE 1 TABLET BY MOUTH DAILY   multivitamin with minerals Tabs tablet Take 1 tablet by mouth daily after supper. Women's 50 plus   simvastatin 20 MG tablet Commonly known as: ZOCOR TAKE 1 TABLET(20 MG) BY MOUTH AT BEDTIME What changed:   how much to take  how to take this  when to take  this   Vagifem 10 MCG Tabs vaginal tablet Generic drug: Estradiol Place 10 mcg vaginally every Monday, Wednesday, and Friday.       Disposition: home   Final Dx: PLIF L1-L2  Discharge Instructions     Remove dressing in 72 hours   Complete by: As directed    Call MD for:  difficulty breathing, headache or visual disturbances   Complete by: As directed    Call MD for:  hives   Complete by: As directed    Call MD for:  persistant dizziness or light-headedness   Complete by: As directed    Call MD for:  persistant nausea and vomiting   Complete by: As directed    Call MD for:  redness, tenderness, or signs of infection (pain, swelling, redness, odor or green/yellow discharge around incision site)   Complete by: As directed    Call MD for:  severe uncontrolled pain   Complete by: As directed    Call MD for:  temperature >100.4   Complete by: As directed    Diet - low sodium heart healthy   Complete by: As directed  Driving Restrictions   Complete by: As directed    No driving for 2 weeks, no riding in the car for 1 week   Increase activity slowly   Complete by: As directed    Lifting restrictions   Complete by: As directed    No lifting more than 8 lbs         Signed: Ocie Cornfield Lilygrace Rodick 12/23/2019, 7:53 AM

## 2020-01-04 ENCOUNTER — Encounter (INDEPENDENT_AMBULATORY_CARE_PROVIDER_SITE_OTHER): Payer: Self-pay | Admitting: Family Medicine

## 2020-01-04 ENCOUNTER — Other Ambulatory Visit: Payer: Self-pay

## 2020-01-04 ENCOUNTER — Telehealth (INDEPENDENT_AMBULATORY_CARE_PROVIDER_SITE_OTHER): Payer: BC Managed Care – PPO | Admitting: Family Medicine

## 2020-01-04 DIAGNOSIS — Z6841 Body Mass Index (BMI) 40.0 and over, adult: Secondary | ICD-10-CM

## 2020-01-04 DIAGNOSIS — I10 Essential (primary) hypertension: Secondary | ICD-10-CM | POA: Diagnosis not present

## 2020-01-04 DIAGNOSIS — E782 Mixed hyperlipidemia: Secondary | ICD-10-CM

## 2020-01-04 DIAGNOSIS — Z981 Arthrodesis status: Secondary | ICD-10-CM | POA: Diagnosis not present

## 2020-01-04 DIAGNOSIS — F3289 Other specified depressive episodes: Secondary | ICD-10-CM

## 2020-01-04 DIAGNOSIS — E66813 Obesity, class 3: Secondary | ICD-10-CM

## 2020-01-04 NOTE — Progress Notes (Signed)
TeleHealth Visit:  Due to the COVID-19 pandemic, this visit was completed with telemedicine (audio/video) technology to reduce patient and provider exposure as well as to preserve personal protective equipment.   Lauren Briggs has verbally consented to this TeleHealth visit. The patient is located at home, the provider is located at the [facility] office. The participants in this visit include the listed provider and patient. The visit was conducted today via MyChart video.  Chief Complaint: OBESITY Lauren Briggs is here to discuss her progress with her obesity treatment plan along with follow-up of her obesity related diagnoses. Lauren Briggs is on practicing portion control and making smarter food choices, such as increasing vegetables and decreasing simple carbohydrates and states she is following her eating plan approximately 0% of the time. Lauren Briggs states she is not exercising at this time.  Today's visit was #: 14 Starting weight: 234 lbs Starting date: 03/02/2019  Interim History: Lauren Briggs is status post lumbar surgery.  Doing well.  She has decreased her use of hydrocodone.  She says she is still having some memory/word-finding difficulty.  Some constipation.   Assessment/Plan:   1. Status post lumbar spinal fusion Reviewed surgery with patient today.  2. Essential hypertension Elevated.  Medications: Zestoretic.   Plan: Avoid buying foods that are: processed, frozen, or prepackaged to avoid excess salt. We will continue to monitor symptoms as they relate to her weight loss journey.  BP Readings from Last 3 Encounters:  12/23/19 (!) 150/50  12/21/19 (!) 153/75  12/20/19 (!) 151/78   Lab Results  Component Value Date   CREATININE 0.73 12/20/2019   3. Mixed hyperlipidemia Lipid-lowering medications: Zetia, fenofibrate, Vascepa, Zocor.   Plan: Dietary changes: Increase soluble fiber. Decrease simple carbohydrates. Exercise changes: An average 40 minutes of moderate to  vigorous-intensity aerobic activity 3 or 4 times per week.   Lab Results  Component Value Date   CHOL 215 (H) 11/29/2019   HDL 78 11/29/2019   LDLCALC 112 (H) 11/29/2019   TRIG 143 11/29/2019   CHOLHDL 2.8 11/29/2019   Lab Results  Component Value Date   ALT 22 03/02/2019   AST 24 03/02/2019   ALKPHOS 60 03/02/2019   BILITOT 0.3 03/02/2019   The 10-year ASCVD risk score [name] DC Jr., et al., 2013) is: 8.1%   Values used to calculate the score:     Age: 64 years     Sex: Female     Is Non-Hispanic African American: No     Diabetic: No     Tobacco smoker: No     Systolic Blood Pressure: 132 mmHg     Is BP treated: Yes     HDL Cholesterol: 78 mg/dL     Total Cholesterol: 215 mg/dL  4. Other depression, with emotional eating Lauren Briggs is taking Cymbalta 60 mg at bedtime.  Controlled. Motivational interviewing as well as evidence-based interventions for health behavior change were utilized today including the discussion of self monitoring techniques, problem-solving barriers and SMART goal setting techniques.   5. Class 3 severe obesity with serious comorbidity and body mass index (BMI) of 40.0 to 44.9 in adult, unspecified obesity type [facility])  Lauren Briggs is currently in the action stage of change. As such, her goal is to continue with weight loss efforts. She has agreed to practicing portion control and making smarter food choices, such as increasing vegetables and decreasing simple carbohydrates.   Exercise goals: Per neurosurgeon.  Behavioral modification strategies: increasing lean protein intake, increasing water intake and increasing  high fiber foods.  Lauren Briggs has agreed to follow-up with our clinic in 4 weeks. She was informed of the importance of frequent follow-up visits to maximize her success with intensive lifestyle modifications for her multiple health conditions.  Objective:   VITALS: Per patient if applicable, see vitals. GENERAL: Alert and in no acute  distress. CARDIOPULMONARY: No increased WOB. Speaking in clear sentences.  PSYCH: Pleasant and cooperative. Speech normal rate and rhythm. Affect is appropriate. Insight and judgement are appropriate. Attention is focused, linear, and appropriate.  NEURO: Oriented as arrived to appointment on time with no prompting.   Lab Results  Component Value Date   CREATININE 0.73 12/20/2019   BUN 25 (H) 12/20/2019   NA 138 12/20/2019   K 4.1 12/20/2019   CL 103 12/20/2019   CO2 26 12/20/2019   Lab Results  Component Value Date   ALT 22 03/02/2019   AST 24 03/02/2019   ALKPHOS 60 03/02/2019   BILITOT 0.3 03/02/2019   Lab Results  Component Value Date   HGBA1C 6.1 (H) 06/14/2019   HGBA1C 6.3 (H) 03/02/2019   HGBA1C 6.3 (H) 11/09/2018   Lab Results  Component Value Date   INSULIN 8.6 03/02/2019   Lab Results  Component Value Date   TSH 1.420 03/02/2019   Lab Results  Component Value Date   CHOL 215 (H) 11/29/2019   HDL 78 11/29/2019   LDLCALC 112 (H) 11/29/2019   TRIG 143 11/29/2019   CHOLHDL 2.8 11/29/2019   Lab Results  Component Value Date   WBC 9.0 12/20/2019   HGB 13.2 12/20/2019   HCT 41.5 12/20/2019   MCV 95.8 12/20/2019   PLT 356 12/20/2019   Lab Results  Component Value Date   IRON 91 03/02/2019   TIBC 480 (H) 03/02/2019   FERRITIN 101 03/02/2019   Attestation Statements:   Reviewed by clinician on day of visit: allergies, medications, problem list, medical history, surgical history, family history, social history, and previous encounter notes.  Time spent on visit including pre-visit chart review and post-visit charting and care was 30 minutes.   I, Water quality scientist, CMA, am acting as transcriptionist for Briscoe Deutscher, DO  I have reviewed the above documentation for accuracy and completeness, and I agree with the above. Briscoe Deutscher, DO

## 2020-01-11 ENCOUNTER — Encounter (INDEPENDENT_AMBULATORY_CARE_PROVIDER_SITE_OTHER): Payer: Self-pay

## 2020-01-24 ENCOUNTER — Other Ambulatory Visit: Payer: Self-pay

## 2020-01-27 DIAGNOSIS — M47816 Spondylosis without myelopathy or radiculopathy, lumbar region: Secondary | ICD-10-CM | POA: Diagnosis not present

## 2020-02-02 DIAGNOSIS — H5203 Hypermetropia, bilateral: Secondary | ICD-10-CM | POA: Diagnosis not present

## 2020-02-02 DIAGNOSIS — H2513 Age-related nuclear cataract, bilateral: Secondary | ICD-10-CM | POA: Diagnosis not present

## 2020-02-03 ENCOUNTER — Encounter (INDEPENDENT_AMBULATORY_CARE_PROVIDER_SITE_OTHER): Payer: Self-pay | Admitting: Family Medicine

## 2020-02-03 ENCOUNTER — Telehealth (INDEPENDENT_AMBULATORY_CARE_PROVIDER_SITE_OTHER): Payer: BC Managed Care – PPO | Admitting: Family Medicine

## 2020-02-03 ENCOUNTER — Other Ambulatory Visit: Payer: Self-pay

## 2020-02-03 DIAGNOSIS — Z981 Arthrodesis status: Secondary | ICD-10-CM

## 2020-02-03 DIAGNOSIS — Z780 Asymptomatic menopausal state: Secondary | ICD-10-CM

## 2020-02-03 DIAGNOSIS — E66813 Obesity, class 3: Secondary | ICD-10-CM

## 2020-02-03 DIAGNOSIS — E782 Mixed hyperlipidemia: Secondary | ICD-10-CM | POA: Diagnosis not present

## 2020-02-03 DIAGNOSIS — Z6841 Body Mass Index (BMI) 40.0 and over, adult: Secondary | ICD-10-CM

## 2020-02-03 DIAGNOSIS — I10 Essential (primary) hypertension: Secondary | ICD-10-CM

## 2020-02-03 DIAGNOSIS — Z9189 Other specified personal risk factors, not elsewhere classified: Secondary | ICD-10-CM

## 2020-02-08 NOTE — Progress Notes (Signed)
TeleHealth Visit:  Due to the COVID-19 pandemic, this visit was completed with telemedicine (audio/video) technology to reduce patient and provider exposure as well as to preserve personal protective equipment.   Lauren Briggs has verbally consented to this TeleHealth visit. The patient is located at home, the provider is located at the [facility] office. The participants in this visit include the listed provider and patient. The visit was conducted today via MyChart video.  Chief Complaint: OBESITY Lauren Briggs is here to discuss her progress with her obesity treatment plan along with follow-up of her obesity related diagnoses. Lauren Briggs is on practicing portion control and making smarter food choices, such as increasing vegetables and decreasing simple carbohydrates and states she is following her eating plan approximately 50% of the time. Lauren Briggs states she is not exercising regularly at this time.  Today's visit was #: 15 Starting weight: 234 lbs Starting date: 03/02/2019  Interim History: Lauren Briggs says she has been getting tired easily.  No discussion of PT.  She says she can ride a bike if it is stationary.  She has been increasing her water intake and getting in her protein.  Assessment/Plan:   1. Mixed hyperlipidemia Lipid-lowering medications: Zocor 20 mg daily, fenofibrate 134 mg daily, Zetia 10 mg daily, Vascepa 2 g daily.   Plan: Dietary changes: Increase soluble fiber. Decrease simple carbohydrates. Exercise changes: An average 40 minutes of moderate to vigorous-intensity aerobic activity 3 or 4 times per week.   Lab Results  Component Value Date   CHOL 215 (H) 11/29/2019   HDL 78 11/29/2019   LDLCALC 112 (H) 11/29/2019   TRIG 143 11/29/2019   CHOLHDL 2.8 11/29/2019   Lab Results  Component Value Date   ALT 22 03/02/2019   AST 24 03/02/2019   ALKPHOS 60 03/02/2019   BILITOT 0.3 03/02/2019   The 10-year ASCVD risk score Lauren Bussing DC Jr., et al., 2013) is: 8.1%   Values used  to calculate the score:     Age: 65 years     Sex: Female     Is Non-Hispanic African American: No     Diabetic: No     Tobacco smoker: No     Systolic Blood Pressure: 993 mmHg     Is BP treated: Yes     HDL Cholesterol: 78 mg/dL     Total Cholesterol: 215 mg/dL  2. Essential hypertension Elevated.  Medications: lisinopril-HCTZ 20-12.5 daily.   Plan: Avoid buying foods that are: processed, frozen, or prepackaged to avoid excess salt. We will continue to monitor symptoms as they relate to her weight loss journey.  BP Readings from Last 3 Encounters:  12/23/19 (!) 150/50  12/21/19 (!) 153/75  12/20/19 (!) 151/78   Lab Results  Component Value Date   CREATININE 0.73 12/20/2019   3. Status post lumbar spinal fusion Followed by Neurosurgery. We will continue to monitor symptoms as they relate to her weight loss journey.  4. Postmenopausal, on HRT Lauren Briggs is on Estrace 1 mg daily. The current medical regimen is effective;  continue present plan and medications.  5. Class 3 severe obesity with serious comorbidity and body mass index (BMI) of 40.0 to 44.9 in adult, unspecified obesity type [facility])  Lauren Briggs is currently in the action stage of change. As such, her goal is to continue with weight loss efforts. She has agreed to practicing portion control and making smarter food choices, such as increasing vegetables and decreasing simple carbohydrates.   Exercise goals: PT when able.  Behavioral modification strategies: increasing lean protein intake, decreasing simple carbohydrates, increasing vegetables and increasing water intake.  Lauren Briggs has agreed to follow-up with our clinic in 3 weeks. She was informed of the importance of frequent follow-up visits to maximize her success with intensive lifestyle modifications for her multiple health conditions.  Objective:   VITALS: Per patient if applicable, see vitals. GENERAL: Alert and in no acute distress. CARDIOPULMONARY: No increased WOB.  Speaking in clear sentences.  PSYCH: Pleasant and cooperative. Speech normal rate and rhythm. Affect is appropriate. Insight and judgement are appropriate. Attention is focused, linear, and appropriate.  NEURO: Oriented as arrived to appointment on time with no prompting.   Lab Results  Component Value Date   CREATININE 0.73 12/20/2019   BUN 25 (H) 12/20/2019   NA 138 12/20/2019   K 4.1 12/20/2019   CL 103 12/20/2019   CO2 26 12/20/2019   Lab Results  Component Value Date   ALT 22 03/02/2019   AST 24 03/02/2019   ALKPHOS 60 03/02/2019   BILITOT 0.3 03/02/2019   Lab Results  Component Value Date   HGBA1C 6.1 (H) 06/14/2019   HGBA1C 6.3 (H) 03/02/2019   HGBA1C 6.3 (H) 11/09/2018   Lab Results  Component Value Date   INSULIN 8.6 03/02/2019   Lab Results  Component Value Date   TSH 1.420 03/02/2019   Lab Results  Component Value Date   CHOL 215 (H) 11/29/2019   HDL 78 11/29/2019   LDLCALC 112 (H) 11/29/2019   TRIG 143 11/29/2019   CHOLHDL 2.8 11/29/2019   Lab Results  Component Value Date   WBC 9.0 12/20/2019   HGB 13.2 12/20/2019   HCT 41.5 12/20/2019   MCV 95.8 12/20/2019   PLT 356 12/20/2019   Lab Results  Component Value Date   IRON 91 03/02/2019   TIBC 480 (H) 03/02/2019   FERRITIN 101 03/02/2019   Attestation Statements:   Reviewed by clinician on day of visit: allergies, medications, problem list, medical history, surgical history, family history, social history, and previous encounter notes.  I, Water quality scientist, CMA, am acting as transcriptionist for Briscoe Deutscher, DO  I have reviewed the above documentation for accuracy and completeness, and I agree with the above. Briscoe Deutscher, DO

## 2020-02-28 ENCOUNTER — Encounter (INDEPENDENT_AMBULATORY_CARE_PROVIDER_SITE_OTHER): Payer: Self-pay | Admitting: Family Medicine

## 2020-02-28 ENCOUNTER — Ambulatory Visit (INDEPENDENT_AMBULATORY_CARE_PROVIDER_SITE_OTHER): Payer: BC Managed Care – PPO | Admitting: Family Medicine

## 2020-02-28 ENCOUNTER — Other Ambulatory Visit: Payer: Self-pay

## 2020-02-28 VITALS — BP 136/74 | HR 79 | Temp 97.7°F | Ht 64.0 in | Wt 238.0 lb

## 2020-02-28 DIAGNOSIS — Z6841 Body Mass Index (BMI) 40.0 and over, adult: Secondary | ICD-10-CM

## 2020-02-28 DIAGNOSIS — E782 Mixed hyperlipidemia: Secondary | ICD-10-CM | POA: Diagnosis not present

## 2020-02-28 DIAGNOSIS — Z981 Arthrodesis status: Secondary | ICD-10-CM | POA: Diagnosis not present

## 2020-02-28 DIAGNOSIS — E65 Localized adiposity: Secondary | ICD-10-CM | POA: Diagnosis not present

## 2020-02-28 DIAGNOSIS — R7303 Prediabetes: Secondary | ICD-10-CM

## 2020-02-28 DIAGNOSIS — Z9189 Other specified personal risk factors, not elsewhere classified: Secondary | ICD-10-CM

## 2020-03-01 NOTE — Progress Notes (Signed)
Chief Complaint:   OBESITY Lauren Briggs is here to discuss her progress with her obesity treatment plan along with follow-up of her obesity related diagnoses.   Today's visit was #: 16 Starting weight: 234 lbs Starting date: 03/02/2019 Today's weight: 238 lbs Today's date: 02/28/2020 Total lbs lost to date: +4 lbs Body mass index is 40.85 kg/m.   Interim History: Lauren Briggs says she will see the neurosurgeon next week.  She is using [employer] meal prep service. Nutrition Plan: practicing portion control and making smarter food choices, such as increasing vegetables and decreasing simple carbohydrates for 80-85% of the time. Activity: None at this time.  Assessment/Plan:   1. Prediabetes Not at goal. Goal is HgbA1c < 5.7.  Medication: None.   Plan:  She will continue to focus on protein-rich, low simple carbohydrate foods. We reviewed the importance of hydration, regular exercise for stress reduction, and restorative sleep.   Lab Results  Component Value Date   HGBA1C 6.1 (H) 06/14/2019   Lab Results  Component Value Date   INSULIN 8.6 03/02/2019   2. Status post lumbar spinal fusion Followed by Neurosurgery. We will continue to monitor symptoms as they relate to her weight loss journey.  3. Mixed hyperlipidemia Course: Not at goal. Lipid-lowering medications: Zetia 10 mg daily, fenofibrate 134 mg daily, Vascepa 2 g twice daily, and Zocor 20 mg daily.   Plan: Dietary changes: Increase soluble fiber, decrease simple carbohydrates, decrease saturated fat. Exercise changes: Moderate to vigorous-intensity aerobic activity 150 minutes per week or as tolerated. We will continue to monitor along with PCP/specialists as it pertains to her weight loss journey.  Lab Results  Component Value Date   CHOL 215 (H) 11/29/2019   HDL 78 11/29/2019   LDLCALC 112 (H) 11/29/2019   TRIG 143 11/29/2019   CHOLHDL 2.8 11/29/2019   Lab Results  Component Value Date   ALT 22 03/02/2019   AST 24  03/02/2019   ALKPHOS 60 03/02/2019   BILITOT 0.3 03/02/2019   The 10-year ASCVD risk score [name] DC Jr., et al., 2013) is: 6.7%   Values used to calculate the score:     Age: 65 years     Sex: Female     Is Non-Hispanic African American: No     Diabetic: No     Tobacco smoker: No     Systolic Blood Pressure: 300 mmHg     Is BP treated: Yes     HDL Cholesterol: 78 mg/dL     Total Cholesterol: 215 mg/dL  4. Visceral obesity Current visceral fat rating: 17. Visceral fat rating should be < 13. Visceral adipose tissue is a hormonally active component of total body fat. This body composition phenotype is associated with medical disorders such as metabolic syndrome, cardiovascular disease and several malignancies including prostate, breast, and colorectal cancers. Starting goal: Lose 7-10% of starting weight. .  5. At risk for impaired metabolic function Due to Lauren Briggs's current state of health and medical condition(s), she is at a significantly higher risk for impaired metabolic function.  This places the patient at a much greater risk to subsequently develop cardiopulmonary conditions that can negatively affect the patient's quality of life.  At least 10 minutes was spent on counseling Lauren Briggs about these concerns today, and I stressed the importance of reversing these risks factors.    Counseling:  Intensive lifestyle modifications discussed with Lauren Briggs as the most appropriate first line treatment.  She will continue to work on diet, exercise, and  weight loss efforts.  We will continue to reassess these conditions on a fairly regular basis in an attempt to decrease the patient's overall morbidity and mortality.  6. Class 3 severe obesity with serious comorbidity and body mass index (BMI) of 40.0 to 44.9 in adult, unspecified obesity type [facility])  Course: Normagene is currently in the action stage of change. As such, her goal is to continue with weight loss efforts.   Nutrition goals: She has agreed to  practicing portion control and making smarter food choices, such as increasing vegetables and decreasing simple carbohydrates.   Exercise goals: Per Neurosurgery.  Behavioral modification strategies: increasing lean protein intake, decreasing simple carbohydrates, increasing vegetables, increasing water intake and decreasing liquid calories.  Lauren Briggs has agreed to follow-up with our clinic in 3 weeks. She was informed of the importance of frequent follow-up visits to maximize her success with intensive lifestyle modifications for her multiple health conditions.   Objective:   Blood pressure 136/74, pulse 79, temperature 97.7 F (36.5 C), temperature source Oral, height 5\' 4"  (1.626 m), weight 238 lb (108 kg), SpO2 97 %. Body mass index is 40.85 kg/m.  General: Cooperative, alert, well developed, in no acute distress. HEENT: Conjunctivae and lids unremarkable. Cardiovascular: Regular rhythm.  Lungs: Normal work of breathing. Neurologic: No focal deficits.   Lab Results  Component Value Date   CREATININE 0.73 12/20/2019   BUN 25 (H) 12/20/2019   NA 138 12/20/2019   K 4.1 12/20/2019   CL 103 12/20/2019   CO2 26 12/20/2019   Lab Results  Component Value Date   ALT 22 03/02/2019   AST 24 03/02/2019   ALKPHOS 60 03/02/2019   BILITOT 0.3 03/02/2019   Lab Results  Component Value Date   HGBA1C 6.1 (H) 06/14/2019   HGBA1C 6.3 (H) 03/02/2019   HGBA1C 6.3 (H) 11/09/2018   Lab Results  Component Value Date   INSULIN 8.6 03/02/2019   Lab Results  Component Value Date   TSH 1.420 03/02/2019   Lab Results  Component Value Date   CHOL 215 (H) 11/29/2019   HDL 78 11/29/2019   LDLCALC 112 (H) 11/29/2019   TRIG 143 11/29/2019   CHOLHDL 2.8 11/29/2019   Lab Results  Component Value Date   WBC 9.0 12/20/2019   HGB 13.2 12/20/2019   HCT 41.5 12/20/2019   MCV 95.8 12/20/2019   PLT 356 12/20/2019   Lab Results  Component Value Date   IRON 91 03/02/2019   TIBC 480 (H)  03/02/2019   FERRITIN 101 03/02/2019   Attestation Statements:   Reviewed by clinician on day of visit: allergies, medications, problem list, medical history, surgical history, family history, social history, and previous encounter notes.  I, Water quality scientist, CMA, am acting as transcriptionist for Briscoe Deutscher, DO  I have reviewed the above documentation for accuracy and completeness, and I agree with the above. Briscoe Deutscher, DO

## 2020-03-09 DIAGNOSIS — M47816 Spondylosis without myelopathy or radiculopathy, lumbar region: Secondary | ICD-10-CM | POA: Diagnosis not present

## 2020-03-16 ENCOUNTER — Telehealth (INDEPENDENT_AMBULATORY_CARE_PROVIDER_SITE_OTHER): Payer: Self-pay

## 2020-03-16 NOTE — Telephone Encounter (Signed)
Pr called requesting a call from [place] regarding her surgery, please give pt a call.

## 2020-03-20 NOTE — Telephone Encounter (Signed)
Dr.Wallace °

## 2020-03-22 ENCOUNTER — Other Ambulatory Visit: Payer: Self-pay | Admitting: Internal Medicine

## 2020-03-29 ENCOUNTER — Encounter (INDEPENDENT_AMBULATORY_CARE_PROVIDER_SITE_OTHER): Payer: Self-pay | Admitting: Family Medicine

## 2020-03-29 ENCOUNTER — Ambulatory Visit (INDEPENDENT_AMBULATORY_CARE_PROVIDER_SITE_OTHER): Payer: BC Managed Care – PPO | Admitting: Family Medicine

## 2020-03-29 ENCOUNTER — Other Ambulatory Visit: Payer: Self-pay

## 2020-03-29 VITALS — BP 152/82 | HR 77 | Temp 97.8°F | Ht 64.0 in | Wt 235.0 lb

## 2020-03-29 DIAGNOSIS — R7303 Prediabetes: Secondary | ICD-10-CM

## 2020-03-29 DIAGNOSIS — R4789 Other speech disturbances: Secondary | ICD-10-CM | POA: Diagnosis not present

## 2020-03-29 DIAGNOSIS — M5442 Lumbago with sciatica, left side: Secondary | ICD-10-CM | POA: Diagnosis not present

## 2020-03-29 DIAGNOSIS — M5441 Lumbago with sciatica, right side: Secondary | ICD-10-CM

## 2020-03-29 DIAGNOSIS — I10 Essential (primary) hypertension: Secondary | ICD-10-CM | POA: Diagnosis not present

## 2020-03-29 DIAGNOSIS — Z9189 Other specified personal risk factors, not elsewhere classified: Secondary | ICD-10-CM | POA: Diagnosis not present

## 2020-03-29 DIAGNOSIS — G8929 Other chronic pain: Secondary | ICD-10-CM

## 2020-03-29 DIAGNOSIS — Z6841 Body Mass Index (BMI) 40.0 and over, adult: Secondary | ICD-10-CM

## 2020-04-02 ENCOUNTER — Encounter (INDEPENDENT_AMBULATORY_CARE_PROVIDER_SITE_OTHER): Payer: Self-pay | Admitting: Family Medicine

## 2020-04-02 NOTE — Progress Notes (Signed)
Chief Complaint:   OBESITY Lauren Briggs is here to discuss her progress with her obesity treatment plan along with follow-up of her obesity related diagnoses.   Today's visit was #: 18 Starting weight: 234 lbs Starting date: 03/02/2019 Today's weight: 235 lbs Today's date: 03/29/2020 Total lbs lost to date: 0 Body mass index is 40.34 kg/m.   Interim History: Lauren Briggs says she will be following up with her neurosurgeon soon.  She plans to ask for a CT of her head.  She says that her memory is improving but is not back to baseline.  She says she is enjoying [employer] meals.  She has increased her activities.    Current Meal Plan: practicing portion control and making smarter food choices, such as increasing vegetables and decreasing simple carbohydrates for 90-95% of the time.  Current Exercise Plan: None.  Assessment/Plan:   1. Essential hypertension Elevated today.  Medications: Zestoretic 20-12.5 daily.   Plan: Avoid buying foods that are: processed, frozen, or prepackaged to avoid excess salt. We will continue to monitor closely alongside her PCP and/or Specialist.  Regular follow up with PCP and specialists was also encouraged.   BP Readings from Last 3 Encounters:  03/29/20 (!) 152/82  02/28/20 136/74  12/23/19 (!) 150/50   Lab Results  Component Value Date   CREATININE 0.73 12/20/2019   2. Prediabetes Improving, but not optimized. Goal is HgbA1c < 5.7.  Medication: None.    Plan:  She will continue to focus on protein-rich, low simple carbohydrate foods. We reviewed the importance of hydration, regular exercise for stress reduction, and restorative sleep.   Lab Results  Component Value Date   HGBA1C 6.1 (H) 06/14/2019   Lab Results  Component Value Date   INSULIN 8.6 03/02/2019   3. Chronic bilateral low back pain, s/p lumbar fusion Followed by Neurosurgery.We will continue to monitor symptoms as they relate toherweight loss journey.  4. Word finding difficulty,  associated with decreased memory Lauren Briggs will be following up with Neurosurgery soon and plans to ask for a head CT.  5. At risk for heart disease Due to Lauren Briggs's current state of health and medical condition(s), she is at a higher risk for heart disease.  This puts the patient at much greater risk to subsequently develop cardiopulmonary conditions that can significantly affect patient's quality of life in a negative manner. At least 8 minutes were spent on counseling Lauren Briggs about these concerns today. Evidence-based interventions for health behavior change were utilized today including the discussion of self monitoring techniques, problem-solving barriers, and SMART goal setting techniques.  Specifically, regarding patient's less desirable eating habits and patterns, we employed the technique of small changes when Lauren Briggs has not been able to fully commit to her prudent nutritional plan.  6. Class 3 severe obesity with serious comorbidity and body mass index (BMI) of 40.0 to 44.9 in adult, unspecified obesity type [facility])  Course: Lauren Briggs is currently in the action stage of change. As such, her goal is to continue with weight loss efforts.   Nutrition goals: She has agreed to practicing portion control and making smarter food choices, such as increasing vegetables and decreasing simple carbohydrates.   Exercise goals: As tolerated.  Behavioral modification strategies: increasing lean protein intake, decreasing simple carbohydrates, increasing vegetables and increasing water intake.  [place] has agreed to follow-up with our clinic in 2-3 weeks. She was informed of the importance of frequent follow-up visits to maximize her success with intensive lifestyle modifications for her multiple  health conditions.   Objective:   Blood pressure (!) 152/82, pulse 77, temperature 97.8 F (36.6 C), temperature source Oral, height 5\' 4"  (1.626 m), weight 235 lb (106.6 kg), SpO2 96 %. Body mass index is 40.34  kg/m.  General: Cooperative, alert, well developed, in no acute distress. HEENT: Conjunctivae and lids unremarkable. Cardiovascular: Regular rhythm.  Lungs: Normal work of breathing. Neurologic: No focal deficits.   Lab Results  Component Value Date   CREATININE 0.73 12/20/2019   BUN 25 (H) 12/20/2019   NA 138 12/20/2019   K 4.1 12/20/2019   CL 103 12/20/2019   CO2 26 12/20/2019   Lab Results  Component Value Date   ALT 22 03/02/2019   AST 24 03/02/2019   ALKPHOS 60 03/02/2019   BILITOT 0.3 03/02/2019   Lab Results  Component Value Date   HGBA1C 6.1 (H) 06/14/2019   HGBA1C 6.3 (H) 03/02/2019   HGBA1C 6.3 (H) 11/09/2018   Lab Results  Component Value Date   INSULIN 8.6 03/02/2019   Lab Results  Component Value Date   TSH 1.420 03/02/2019   Lab Results  Component Value Date   CHOL 215 (H) 11/29/2019   HDL 78 11/29/2019   LDLCALC 112 (H) 11/29/2019   TRIG 143 11/29/2019   CHOLHDL 2.8 11/29/2019   Lab Results  Component Value Date   WBC 9.0 12/20/2019   HGB 13.2 12/20/2019   HCT 41.5 12/20/2019   MCV 95.8 12/20/2019   PLT 356 12/20/2019   Lab Results  Component Value Date   IRON 91 03/02/2019   TIBC 480 (H) 03/02/2019   FERRITIN 101 03/02/2019   Attestation Statements:   Reviewed by clinician on day of visit: allergies, medications, problem list, medical history, surgical history, family history, social history, and previous encounter notes.  I, Water quality scientist, CMA, am acting as transcriptionist for Briscoe Deutscher, DO  I have reviewed the above documentation for accuracy and completeness, and I agree with the above. Briscoe Deutscher, DO

## 2020-04-06 ENCOUNTER — Ambulatory Visit (INDEPENDENT_AMBULATORY_CARE_PROVIDER_SITE_OTHER): Payer: BC Managed Care – PPO | Admitting: Family Medicine

## 2020-04-17 ENCOUNTER — Ambulatory Visit (INDEPENDENT_AMBULATORY_CARE_PROVIDER_SITE_OTHER): Payer: BC Managed Care – PPO | Admitting: Family Medicine

## 2020-04-17 ENCOUNTER — Other Ambulatory Visit: Payer: Self-pay

## 2020-04-17 ENCOUNTER — Encounter (INDEPENDENT_AMBULATORY_CARE_PROVIDER_SITE_OTHER): Payer: Self-pay | Admitting: Family Medicine

## 2020-04-17 VITALS — BP 156/73 | HR 73 | Temp 97.5°F | Ht 64.0 in | Wt 238.0 lb

## 2020-04-17 DIAGNOSIS — I10 Essential (primary) hypertension: Secondary | ICD-10-CM | POA: Diagnosis not present

## 2020-04-17 DIAGNOSIS — M5442 Lumbago with sciatica, left side: Secondary | ICD-10-CM

## 2020-04-17 DIAGNOSIS — E782 Mixed hyperlipidemia: Secondary | ICD-10-CM | POA: Diagnosis not present

## 2020-04-17 DIAGNOSIS — Z6841 Body Mass Index (BMI) 40.0 and over, adult: Secondary | ICD-10-CM

## 2020-04-17 DIAGNOSIS — M5441 Lumbago with sciatica, right side: Secondary | ICD-10-CM

## 2020-04-17 DIAGNOSIS — R7303 Prediabetes: Secondary | ICD-10-CM | POA: Diagnosis not present

## 2020-04-17 DIAGNOSIS — G8929 Other chronic pain: Secondary | ICD-10-CM

## 2020-04-26 NOTE — Progress Notes (Signed)
Chief Complaint:   OBESITY Lauren Briggs is here to discuss her progress with her obesity treatment plan along with follow-up of her obesity related diagnoses.   Today's visit was #: 18 Starting weight: 234 lbs Starting date: 03/02/2019 Today's weight: 238 lbs Today's date: 04/17/2020 Total lbs lost to date: +4 Body mass index is 40.85 kg/m.   Interim History:  Lauren Briggs says that she has been under stress due to tax season.  She will be seeing Dr. Saintclair Briggs next week.  She is still doing [employer].    She is going to the beach the weekend after Easter.  She says she is coming back, "when I darn well please".  Current Meal Plan: practicing portion control and making smarter food choices, such as increasing vegetables and decreasing simple carbohydrates for 90% of the time.  Current Exercise Plan: None.  Assessment/Plan:   1. Essential hypertension Not at goal. Medications: Zestoretic 20-12.5 mg daily.   Plan: Avoid buying foods that are: processed, frozen, or prepackaged to avoid excess salt. We will continue to monitor closely alongside her PCP and/or Specialist.  Regular follow up with PCP and specialists was also encouraged.   BP Readings from Last 3 Encounters:  04/17/20 (!) 156/73  03/29/20 (!) 152/82  02/28/20 136/74   Lab Results  Component Value Date   CREATININE 0.73 12/20/2019   2. Prediabetes Not at goal. Goal is HgbA1c < 5.7.  Medication: None.    Plan:  She will continue to focus on protein-rich, low simple carbohydrate foods. We reviewed the importance of hydration, regular exercise for stress reduction, and restorative sleep.   Lab Results  Component Value Date   HGBA1C 6.1 (H) 06/14/2019   Lab Results  Component Value Date   INSULIN 8.6 03/02/2019   3. Chronic bilateral low back pain, s/p lumbar fusion Followed by Neurosurgery.We will continue to monitor symptoms as they relate toherweight loss journey.  4. Mixed hyperlipidemia Course: Not at goal.  Lipid-lowering medications: Zetia 10 mg daily, fenofibrate 134 mg daily, Vascepa 2 mg daily, Zocor 20 mg daily.   Plan: Dietary changes: Increase soluble fiber, decrease simple carbohydrates, decrease saturated fat. Exercise changes: Moderate to vigorous-intensity aerobic activity 150 minutes per week or as tolerated. We will continue to monitor along with PCP/specialists as it pertains to her weight loss journey.  Lab Results  Component Value Date   CHOL 215 (H) 11/29/2019   HDL 78 11/29/2019   LDLCALC 112 (H) 11/29/2019   TRIG 143 11/29/2019   CHOLHDL 2.8 11/29/2019   Lab Results  Component Value Date   ALT 22 03/02/2019   AST 24 03/02/2019   ALKPHOS 60 03/02/2019   BILITOT 0.3 03/02/2019   The 10-year ASCVD risk score Lauren Briggs., et al., 2013) is: 8.7%   Values used to calculate the score:     Age: 65 years     Sex: Female     Is Non-Hispanic African American: No     Diabetic: No     Tobacco smoker: No     Systolic Blood Pressure: 096 mmHg     Is BP treated: Yes     HDL Cholesterol: 78 mg/dL     Total Cholesterol: 215 mg/dL  5. Obesity, current BMI 41  Course: Lauren Briggs is currently in the action stage of change. As such, her goal is to continue with weight loss efforts.   Nutrition goals: She has agreed to practicing portion control and making smarter food choices, such as  increasing vegetables and decreasing simple carbohydrates.   Exercise goals: As tolerated.  Behavioral modification strategies: increasing lean protein intake, decreasing simple carbohydrates, increasing vegetables and increasing water intake.  Lauren Briggs has agreed to follow-up with our clinic in 4 weeks. She was informed of the importance of frequent follow-up visits to maximize her success with intensive lifestyle modifications for her multiple health conditions.   Objective:   Blood pressure (!) 156/73, pulse 73, temperature (!) 97.5 F (36.4 C), temperature source Oral, height 5\' 4"  (1.626 m), weight  238 lb (108 kg), SpO2 97 %. Body mass index is 40.85 kg/m.  General: Cooperative, alert, well developed, in no acute distress. HEENT: Conjunctivae and lids unremarkable. Cardiovascular: Regular rhythm.  Lungs: Normal work of breathing. Neurologic: No focal deficits.   Lab Results  Component Value Date   CREATININE 0.73 12/20/2019   BUN 25 (H) 12/20/2019   NA 138 12/20/2019   K 4.1 12/20/2019   CL 103 12/20/2019   CO2 26 12/20/2019   Lab Results  Component Value Date   ALT 22 03/02/2019   AST 24 03/02/2019   ALKPHOS 60 03/02/2019   BILITOT 0.3 03/02/2019   Lab Results  Component Value Date   HGBA1C 6.1 (H) 06/14/2019   HGBA1C 6.3 (H) 03/02/2019   HGBA1C 6.3 (H) 11/09/2018   Lab Results  Component Value Date   INSULIN 8.6 03/02/2019   Lab Results  Component Value Date   TSH 1.420 03/02/2019   Lab Results  Component Value Date   CHOL 215 (H) 11/29/2019   HDL 78 11/29/2019   LDLCALC 112 (H) 11/29/2019   TRIG 143 11/29/2019   CHOLHDL 2.8 11/29/2019   Lab Results  Component Value Date   WBC 9.0 12/20/2019   HGB 13.2 12/20/2019   HCT 41.5 12/20/2019   MCV 95.8 12/20/2019   PLT 356 12/20/2019   Lab Results  Component Value Date   IRON 91 03/02/2019   TIBC 480 (H) 03/02/2019   FERRITIN 101 03/02/2019   Attestation Statements:   Reviewed by clinician on day of visit: allergies, medications, problem list, medical history, surgical history, family history, social history, and previous encounter notes.  I, Water quality scientist, CMA, am acting as transcriptionist for Briscoe Deutscher, DO  I have reviewed the above documentation for accuracy and completeness, and I agree with the above. Briscoe Deutscher, DO

## 2020-05-01 ENCOUNTER — Encounter: Payer: Self-pay | Admitting: Internal Medicine

## 2020-05-10 ENCOUNTER — Encounter: Payer: Self-pay | Admitting: Internal Medicine

## 2020-05-11 DIAGNOSIS — M47816 Spondylosis without myelopathy or radiculopathy, lumbar region: Secondary | ICD-10-CM | POA: Diagnosis not present

## 2020-05-25 ENCOUNTER — Other Ambulatory Visit: Payer: Self-pay

## 2020-05-25 ENCOUNTER — Encounter (INDEPENDENT_AMBULATORY_CARE_PROVIDER_SITE_OTHER): Payer: Self-pay | Admitting: Family Medicine

## 2020-05-25 ENCOUNTER — Ambulatory Visit (INDEPENDENT_AMBULATORY_CARE_PROVIDER_SITE_OTHER): Payer: BC Managed Care – PPO | Admitting: Family Medicine

## 2020-05-25 VITALS — BP 147/77 | HR 75 | Temp 98.1°F | Ht 64.0 in | Wt 242.0 lb

## 2020-05-25 DIAGNOSIS — E8881 Metabolic syndrome: Secondary | ICD-10-CM

## 2020-05-25 DIAGNOSIS — E782 Mixed hyperlipidemia: Secondary | ICD-10-CM | POA: Diagnosis not present

## 2020-05-25 DIAGNOSIS — F418 Other specified anxiety disorders: Secondary | ICD-10-CM

## 2020-05-25 DIAGNOSIS — R7303 Prediabetes: Secondary | ICD-10-CM | POA: Diagnosis not present

## 2020-05-25 DIAGNOSIS — M79671 Pain in right foot: Secondary | ICD-10-CM

## 2020-05-25 DIAGNOSIS — Z6841 Body Mass Index (BMI) 40.0 and over, adult: Secondary | ICD-10-CM

## 2020-05-25 DIAGNOSIS — I10 Essential (primary) hypertension: Secondary | ICD-10-CM

## 2020-05-25 DIAGNOSIS — M79672 Pain in left foot: Secondary | ICD-10-CM

## 2020-05-25 DIAGNOSIS — G894 Chronic pain syndrome: Secondary | ICD-10-CM

## 2020-05-25 NOTE — Progress Notes (Signed)
Chief Complaint:   OBESITY Lauren Briggs is here to discuss her progress with her obesity treatment plan along with follow-up of her obesity related diagnoses.   Today's visit was #: 29 Starting weight: 234 lbs Starting date: 03/02/2019 Today's weight: 242 lbs Today's date: 05/25/2020 Total lbs lost to date: +8 lbs Body mass index is 41.54 kg/m.   Interim History:  Lauren Briggs says that her father-in-law died.  We discussed semaglutide.  She will be working with Lauren Briggs, [facility]. Current Meal Plan: practicing portion control and making smarter food choices, such as increasing vegetables and decreasing simple carbohydrates for 5% of the time.  Current Exercise Plan: None.  Assessment/Plan:   1. Mixed hyperlipidemia Course: Not at goal. Lipid-lowering medications: Zetia 10 mg daily, Vascepa 2 g daily, Zocor 20 mg daily, fenofibrate 134 mg daily.   Plan: Dietary changes: Increase soluble fiber, decrease simple carbohydrates, decrease saturated fat. Exercise changes: Moderate to vigorous-intensity aerobic activity 150 minutes per week or as tolerated. We will continue to monitor along with PCP/specialists as it pertains to her weight loss journey.  Lab Results  Component Value Date   CHOL 215 (H) 11/29/2019   HDL 78 11/29/2019   LDLCALC 112 (H) 11/29/2019   TRIG 143 11/29/2019   CHOLHDL 2.8 11/29/2019   Lab Results  Component Value Date   ALT 22 03/02/2019   AST 24 03/02/2019   ALKPHOS 60 03/02/2019   BILITOT 0.3 03/02/2019   The 10-year ASCVD risk score [name] DC Jr., et al., 2013) is: 7.8%   Values used to calculate the score:     Age: 65 years     Sex: Female     Is Non-Hispanic African American: No     Diabetic: No     Tobacco smoker: No     Systolic Blood Pressure: 160 mmHg     Is BP treated: Yes     HDL Cholesterol: 78 mg/dL     Total Cholesterol: 215 mg/dL  2. Essential hypertension Not at goal. Medications: Zestoretic 20-12.5 mg daily.   Plan: Avoid buying foods that  are: processed, frozen, or prepackaged to avoid excess salt. We will continue to monitor closely alongside her PCP and/or Specialist.  Regular follow up with PCP and specialists was also encouraged.   BP Readings from Last 3 Encounters:  05/25/20 (!) 147/77  04/17/20 (!) 156/73  03/29/20 (!) 152/82   Lab Results  Component Value Date   CREATININE 0.73 12/20/2019   3. Prediabetes Not at goal. Goal is HgbA1c < 5.7.  Medication: None.    Plan:  She will continue to focus on protein-rich, low simple carbohydrate foods. We reviewed the importance of hydration, regular exercise for stress reduction, and restorative sleep.   Lab Results  Component Value Date   HGBA1C 6.1 (H) 06/14/2019   Lab Results  Component Value Date   INSULIN 8.6 03/02/2019   4. Metabolic syndrome Starting goal: Lose 7-10% of starting weight. She will continue to focus on protein-rich, low simple carbohydrate foods. We reviewed the importance of hydration, regular exercise for stress reduction, and restorative sleep.  We will continue to check lab work every 3 months, with 10% weight loss, or should any other concerns arise.  5. Chronic pain syndrome We will continue to monitor symptoms as they relate toherweight loss journey.  6. Bilateral foot pain Lauren Briggs plans to see the podiatrist.  7. Situational anxiety Behavior modification techniques were discussed today to help Lauren Briggs deal with her anxiety.  8. Obesity, current BMI 41.7  Course: Lauren Briggs is currently in the action stage of change. As such, her goal is to continue with weight loss efforts.   Nutrition goals: She has agreed to practicing portion control and making smarter food choices, such as increasing vegetables and decreasing simple carbohydrates.   Exercise goals: Older adults should follow the adult guidelines. When older adults cannot meet the adult guidelines, they should be as physically active as their abilities and conditions will allow.   Older adults should do exercises that maintain or improve balance if they are at risk of falling.   Behavioral modification strategies: increasing lean protein intake, decreasing simple carbohydrates, increasing vegetables and increasing water intake.  Lauren Briggs has agreed to follow-up with our clinic in 4 weeks. She was informed of the importance of frequent follow-up visits to maximize her success with intensive lifestyle modifications for her multiple health conditions.   Objective:   Blood pressure (!) 147/77, pulse 75, temperature 98.1 F (36.7 C), temperature source Oral, height 5\' 4"  (1.626 m), weight 242 lb (109.8 kg), SpO2 97 %. Body mass index is 41.54 kg/m.  General: Cooperative, alert, well developed, in no acute distress. HEENT: Conjunctivae and lids unremarkable. Cardiovascular: Regular rhythm.  Lungs: Normal work of breathing. Neurologic: No focal deficits.   Lab Results  Component Value Date   CREATININE 0.73 12/20/2019   BUN 25 (H) 12/20/2019   NA 138 12/20/2019   K 4.1 12/20/2019   CL 103 12/20/2019   CO2 26 12/20/2019   Lab Results  Component Value Date   ALT 22 03/02/2019   AST 24 03/02/2019   ALKPHOS 60 03/02/2019   BILITOT 0.3 03/02/2019   Lab Results  Component Value Date   HGBA1C 6.1 (H) 06/14/2019   HGBA1C 6.3 (H) 03/02/2019   HGBA1C 6.3 (H) 11/09/2018   Lab Results  Component Value Date   INSULIN 8.6 03/02/2019   Lab Results  Component Value Date   TSH 1.420 03/02/2019   Lab Results  Component Value Date   CHOL 215 (H) 11/29/2019   HDL 78 11/29/2019   LDLCALC 112 (H) 11/29/2019   TRIG 143 11/29/2019   CHOLHDL 2.8 11/29/2019   Lab Results  Component Value Date   WBC 9.0 12/20/2019   HGB 13.2 12/20/2019   HCT 41.5 12/20/2019   MCV 95.8 12/20/2019   PLT 356 12/20/2019   Lab Results  Component Value Date   IRON 91 03/02/2019   TIBC 480 (H) 03/02/2019   FERRITIN 101 03/02/2019   Attestation Statements:   Reviewed by clinician  on day of visit: allergies, medications, problem list, medical history, surgical history, family history, social history, and previous encounter notes.  I, Water quality scientist, CMA, am acting as transcriptionist for Briscoe Deutscher, DO  I have reviewed the above documentation for accuracy and completeness, and I agree with the above. Briscoe Deutscher, DO

## 2020-06-15 ENCOUNTER — Other Ambulatory Visit: Payer: Self-pay | Admitting: Cardiovascular Disease

## 2020-06-16 ENCOUNTER — Other Ambulatory Visit: Payer: Self-pay | Admitting: Internal Medicine

## 2020-06-16 DIAGNOSIS — E782 Mixed hyperlipidemia: Secondary | ICD-10-CM

## 2020-07-06 ENCOUNTER — Ambulatory Visit (INDEPENDENT_AMBULATORY_CARE_PROVIDER_SITE_OTHER): Payer: BC Managed Care – PPO | Admitting: Family Medicine

## 2020-07-25 ENCOUNTER — Other Ambulatory Visit: Payer: Self-pay | Admitting: Internal Medicine

## 2020-07-25 DIAGNOSIS — E782 Mixed hyperlipidemia: Secondary | ICD-10-CM

## 2020-08-03 ENCOUNTER — Encounter (INDEPENDENT_AMBULATORY_CARE_PROVIDER_SITE_OTHER): Payer: Self-pay | Admitting: Family Medicine

## 2020-08-03 ENCOUNTER — Ambulatory Visit (INDEPENDENT_AMBULATORY_CARE_PROVIDER_SITE_OTHER): Payer: Medicare Other | Admitting: Family Medicine

## 2020-08-03 ENCOUNTER — Other Ambulatory Visit: Payer: Self-pay

## 2020-08-03 VITALS — BP 140/63 | HR 82 | Temp 97.8°F | Ht 64.0 in | Wt 242.0 lb

## 2020-08-03 DIAGNOSIS — F418 Other specified anxiety disorders: Secondary | ICD-10-CM | POA: Diagnosis not present

## 2020-08-03 DIAGNOSIS — E8881 Metabolic syndrome: Secondary | ICD-10-CM

## 2020-08-03 DIAGNOSIS — Z6841 Body Mass Index (BMI) 40.0 and over, adult: Secondary | ICD-10-CM | POA: Diagnosis not present

## 2020-08-03 DIAGNOSIS — M79671 Pain in right foot: Secondary | ICD-10-CM

## 2020-08-07 ENCOUNTER — Ambulatory Visit (INDEPENDENT_AMBULATORY_CARE_PROVIDER_SITE_OTHER): Payer: Medicare Other

## 2020-08-07 ENCOUNTER — Other Ambulatory Visit: Payer: Self-pay

## 2020-08-07 ENCOUNTER — Ambulatory Visit (INDEPENDENT_AMBULATORY_CARE_PROVIDER_SITE_OTHER): Payer: Medicare Other | Admitting: Podiatry

## 2020-08-07 DIAGNOSIS — M898X7 Other specified disorders of bone, ankle and foot: Secondary | ICD-10-CM | POA: Diagnosis not present

## 2020-08-07 DIAGNOSIS — M67471 Ganglion, right ankle and foot: Secondary | ICD-10-CM

## 2020-08-07 NOTE — Progress Notes (Signed)
   HPI: 65 y.o. female presenting today as a new patient for evaluation of a symptomatic lump to the dorsal aspect of the right foot.  Patient has been diagnosed with a ganglion cyst and treated for several months at Dr. Lindley Magnus office, local podiatrist without any improvement.  She states that it is only gotten more painful and symptomatic.  It is painful with shoes.  She denies a history of injury.  She has tried multiple conservative options including shoe gear modifications with no relief.  She states that it is at the point now she is willing to have surgery to have it removed.  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   Neuromuscular disorder ([place])    Pre-diabetes    Prediabetes 11/10/2018   Shortness of breath dyspnea      Physical Exam: General: The patient is alert and oriented x3 in no acute distress.  Dermatology: Skin is warm, dry and supple bilateral lower extremities. Negative for open lesions or macerations.  Vascular: Palpable pedal pulses bilaterally. No edema or erythema noted. Capillary refill within normal limits.  Neurological: Epicritic and protective threshold grossly intact bilaterally.   Musculoskeletal Exam: No pedal deformities noted.  There is a semi-adhered lesion to the dorsal aspect of the right foot.  It symptomatic with palpation.  Findings would be consistent with a hard ganglion cyst with likely underlying exostosis of the metatarsal bones  Radiographic Exam:  There does appear to be some exostosis and spurring to the dorsal aspect of the cuneiforms and midtarsal joint right foot best visualized on lateral view  Assessment: 1.  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  Underlying bone exostosis right   Plan of Care:  1. Patient evaluated. X-Rays reviewed.  2. Today we discussed the conservative versus surgical management of the presenting pathology. The patient opts for surgical management. All possible complications and details of the procedure were explained. All patient questions were answered. No guarantees were expressed or implied. 3. Authorization for surgery was initiated today. Surgery will consist of excision of ganglion cyst right.  Exostectomy right midtarsal joint. 4.  Return to clinic 1 week postop       Edrick Kins, DPM [facility]  Dr. Edrick Kins, DPM    [address], [place] [address]                Office [phone]  Fax [phone]

## 2020-08-08 ENCOUNTER — Encounter (INDEPENDENT_AMBULATORY_CARE_PROVIDER_SITE_OTHER): Payer: Self-pay | Admitting: Family Medicine

## 2020-08-15 NOTE — Progress Notes (Signed)
Chief Complaint:   OBESITY Alley is here to discuss her progress with her obesity treatment plan along with follow-up of her obesity related diagnoses.   Today's visit was #: 20 Starting weight: 234 lbs Starting date: 03/02/2019 Today's weight: 242 lbs Today's date: 08/03/2020 Weight change since last visit: 0 Total lbs lost to date: +8 lbs Body mass index is 41.54 kg/m.   Interim History: Carrah says she would like a therapist.  She endorses right foot pain.  She will be seeing Podiatry. Current Meal Plan: practicing portion control and making smarter food choices, such as increasing vegetables and decreasing simple carbohydrates for 2% of the time.  Current Exercise Plan: None.  Assessment/Plan:   1. Metabolic syndrome Starting goal: Lose 7-10% of starting weight. She will continue to focus on protein-rich, low simple carbohydrate foods. We reviewed the importance of hydration, regular exercise for stress reduction, and restorative sleep.  We will continue to check lab work every 3 months, with 10% weight loss, or should any other concerns arise.  2. Situational anxiety Behavior modification techniques were discussed today to help Marla deal with her anxiety. I will send a myChart message with therapy options.   3. Right foot pain Ilany will be seeing Podiatry. We will continue to monitor symptoms as they relate to her weight loss journey.  4. Obesity with current BMI Of 41.5  Course: Najiyyah is currently in the action stage of change. As such, her goal is to continue with weight loss efforts.   Nutrition goals: She has agreed to practicing portion control and making smarter food choices, such as increasing vegetables and decreasing simple carbohydrates.   Exercise goals:  As tolerated.  Behavioral modification strategies: increasing lean protein intake, decreasing simple carbohydrates, increasing vegetables, increasing water intake, and emotional eating strategies.  Matteson  has agreed to follow-up with our clinic in 4 weeks. She was informed of the importance of frequent follow-up visits to maximize her success with intensive lifestyle modifications for her multiple health conditions.   Objective:   Blood pressure 140/63, pulse 82, temperature 97.8 F (36.6 C), temperature source Oral, height '5\' 4"'$  (1.626 m), weight 242 lb (109.8 kg), SpO2 97 %. Body mass index is 41.54 kg/m.  General: Cooperative, alert, well developed, in no acute distress. HEENT: Conjunctivae and lids unremarkable. Cardiovascular: Regular rhythm.  Lungs: Normal work of breathing. Neurologic: No focal deficits.   Lab Results  Component Value Date   CREATININE 0.73 12/20/2019   BUN 25 (H) 12/20/2019   NA 138 12/20/2019   K 4.1 12/20/2019   CL 103 12/20/2019   CO2 26 12/20/2019   Lab Results  Component Value Date   ALT 22 03/02/2019   AST 24 03/02/2019   ALKPHOS 60 03/02/2019   BILITOT 0.3 03/02/2019   Lab Results  Component Value Date   HGBA1C 6.1 (H) 06/14/2019   HGBA1C 6.3 (H) 03/02/2019   HGBA1C 6.3 (H) 11/09/2018   Lab Results  Component Value Date   INSULIN 8.6 03/02/2019   Lab Results  Component Value Date   TSH 1.420 03/02/2019   Lab Results  Component Value Date   CHOL 215 (H) 11/29/2019   HDL 78 11/29/2019   LDLCALC 112 (H) 11/29/2019   TRIG 143 11/29/2019   CHOLHDL 2.8 11/29/2019   Lab Results  Component Value Date   VD25OH 42.5 03/02/2019   Lab Results  Component Value Date   WBC 9.0 12/20/2019   HGB 13.2 12/20/2019  HCT 41.5 12/20/2019   MCV 95.8 12/20/2019   PLT 356 12/20/2019   Lab Results  Component Value Date   IRON 91 03/02/2019   TIBC 480 (H) 03/02/2019   FERRITIN 101 03/02/2019   Obesity Behavioral Intervention:   Approximately 15 minutes were spent on the discussion below.  ASK: We discussed the diagnosis of obesity with Giovanni today and Uchechi agreed to give [place] permission to discuss obesity behavioral modification therapy  today.  ASSESS: Keyundra has the diagnosis of obesity and her BMI today is 41.5. Jestine is in the action stage of change.   ADVISE: Leilene was educated on the multiple health risks of obesity as well as the benefit of weight loss to improve her health. She was advised of the need for long term treatment and the importance of lifestyle modifications to improve her current health and to decrease her risk of future health problems.  AGREE: Multiple dietary modification options and treatment options were discussed and Brooke-Lynn agreed to follow the recommendations documented in the above note.  ARRANGE: Jahnessa was educated on the importance of frequent visits to treat obesity as outlined per CMS and USPSTF guidelines and agreed to schedule her next follow up appointment today.  Attestation Statements:   Reviewed by clinician on day of visit: allergies, medications, problem list, medical history, surgical history, family history, social history, and previous encounter notes.  I, Water quality scientist, CMA, am acting as transcriptionist for Briscoe Deutscher, DO  I have reviewed the above documentation for accuracy and completeness, and I agree with the above. Briscoe Deutscher, DO

## 2020-08-17 ENCOUNTER — Other Ambulatory Visit: Payer: Self-pay | Admitting: Podiatry

## 2020-08-17 DIAGNOSIS — M67471 Ganglion, right ankle and foot: Secondary | ICD-10-CM | POA: Diagnosis not present

## 2020-08-17 DIAGNOSIS — M25774 Osteophyte, right foot: Secondary | ICD-10-CM | POA: Diagnosis not present

## 2020-08-17 MED ORDER — OXYCODONE-ACETAMINOPHEN 5-325 MG PO TABS: 1.0000 | ORAL_TABLET | ORAL | 0 refills | Status: DC | PRN

## 2020-08-17 NOTE — Progress Notes (Signed)
PRN postop 

## 2020-08-18 ENCOUNTER — Telehealth: Payer: Self-pay | Admitting: *Deleted

## 2020-08-18 ENCOUNTER — Telehealth: Payer: Self-pay | Admitting: Podiatry

## 2020-08-18 ENCOUNTER — Telehealth: Payer: Self-pay | Admitting: Internal Medicine

## 2020-08-18 DIAGNOSIS — E782 Mixed hyperlipidemia: Secondary | ICD-10-CM

## 2020-08-18 NOTE — Telephone Encounter (Signed)
Patient is having difficulty with the cam walker and would like to know if there is a softer boot or shoe that she can replace with. Please advise.

## 2020-08-18 NOTE — Telephone Encounter (Signed)
Patient stated she had sx yesterday and the Cam boot is hard for her to walk in, wanted to know if we had a softer shoe. She is uncomfortable.

## 2020-08-18 NOTE — Telephone Encounter (Signed)
Lipid panel ordered MyChart message sent to patient to complete before next visit with [facility] MD

## 2020-08-22 NOTE — Telephone Encounter (Signed)
Patient may discontinue cam boot

## 2020-08-22 NOTE — Telephone Encounter (Signed)
She can discontinue the cam walker.  She can wear anything she wants on the foot as long as she keeps the dressings clean dry and intact.  I would be okay even if she just put a clean cotton sock over the dressing.  Thanks, Dr. Amalia Hailey

## 2020-08-23 ENCOUNTER — Ambulatory Visit (INDEPENDENT_AMBULATORY_CARE_PROVIDER_SITE_OTHER): Payer: Medicare Other | Admitting: Podiatry

## 2020-08-23 ENCOUNTER — Other Ambulatory Visit: Payer: Self-pay

## 2020-08-23 DIAGNOSIS — Z9889 Other specified postprocedural states: Secondary | ICD-10-CM

## 2020-08-23 DIAGNOSIS — M67471 Ganglion, right ankle and foot: Secondary | ICD-10-CM

## 2020-08-23 DIAGNOSIS — M898X7 Other specified disorders of bone, ankle and foot: Secondary | ICD-10-CM

## 2020-08-23 NOTE — Telephone Encounter (Signed)
Returned call to patient, no answer,left vmessage per Dr Amalia Hailey recommendations concerning cam walker.

## 2020-08-25 ENCOUNTER — Encounter: Payer: Self-pay | Admitting: Podiatry

## 2020-08-25 NOTE — Progress Notes (Signed)
Subjective:  Patient ID: Lauren Briggs, female    DOB: February 01, 1955,  MRN: [id number]  Chief Complaint  Patient presents with   Routine Post Op    POS DOS 7.28.22    DOS: 08/17/2020 Procedure: Right foot excision of ganglion cyst  65 y.o. female returns for post-op check.  Patient states she is doing well.  She has been ambulating without boot on.  She denies any other acute complaints.  She her bandages are clean dry and intact.  Her pain is controlled  Review of Systems: Negative except as noted in the HPI. Denies N/V/F/Ch.  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   Neuromuscular disorder ([facility])    Pre-diabetes    Prediabetes 11/10/2018   Shortness of breath dyspnea     Current Outpatient Medications:    acetaminophen (TYLENOL) 500 MG tablet, Take 1,000 mg by mouth every 8 (eight) hours as needed for moderate pain or headache., Disp: , Rfl:    Coenzyme Q10 (COQ-10) 100 MG CAPS, Take 100 mg by mouth in the morning and at bedtime. otc, Disp: 30 capsule, Rfl: 0   cyclobenzaprine (FLEXERIL) 10 MG tablet, Take 1 tablet (10 mg total) by mouth 3 (three) times daily as needed for muscle spasms., Disp: 30 tablet, Rfl: 0   DULoxetine (CYMBALTA) 60 MG capsule, Take 60 mg by mouth at bedtime. , Disp: , Rfl:    estradiol (ESTRACE) 1 MG tablet, Take 1 mg by mouth every morning. , Disp: , Rfl:    Estradiol 10 MCG TABS vaginal tablet, Place 10 mcg vaginally every Monday, Wednesday, and Friday., Disp: , Rfl:    ezetimibe (ZETIA) 10 MG tablet, TAKE 1 TABLET(10 MG) BY MOUTH DAILY, Disp: 90 tablet, Rfl: 3   fenofibrate micronized (LOFIBRA) 134 MG capsule, TAKE 1 CAPSULE(134 MG) BY MOUTH DAILY BEFORE BREAKFAST, Disp: 90 capsule, Rfl: 3   fexofenadine  (ALLEGRA) 180 MG tablet, Take 180 mg by mouth daily., Disp: , Rfl:    lisinopril-hydrochlorothiazide (ZESTORETIC) 20-12.5 MG tablet, TAKE 1 TABLET BY MOUTH DAILY, Disp: 90 tablet, Rfl: 1   Multiple Vitamin (MULTIVITAMIN WITH MINERALS) TABS, Take 1 tablet by mouth daily after supper. Women's 50 plus, Disp: , Rfl:    oxyCODONE-acetaminophen (PERCOCET) 5-325 MG tablet, Take 1 tablet by mouth every 4 (four) hours as needed for severe pain., Disp: 30 tablet, Rfl: 0   simvastatin (ZOCOR) 20 MG tablet, TAKE 1 TABLET(20 MG) BY MOUTH AT BEDTIME, Disp: 90 tablet, Rfl: 3   VASCEPA 1 g capsule, TAKE 2 CAPSULES(2 GRAMS) BY MOUTH TWICE DAILY, Disp: 360 capsule, Rfl: 3  Social History   Tobacco Use  Smoking Status Former   Years: 3.00   Types: Cigarettes   Quit date: 1981   Years since quitting: 41.6  Smokeless Tobacco Former   Quit date: 01/22/1979    Allergies  Allergen Reactions   Iodine Nausea And Vomiting    During a myelogram    Propoxyphene Hcl Palpitations    darvon    Objective:  There were no vitals filed for this visit. There is no height or weight on file to calculate BMI. Constitutional Well developed. Well nourished.  Vascular Foot warm and well perfused.  Capillary refill normal to all digits.   Neurologic Normal speech. Oriented to person, place, and time. Epicritic sensation to light touch grossly present bilaterally.  Dermatologic Skin healing well without signs of infection. Skin edges well coapted without signs of infection.  Orthopedic: Tenderness to palpation noted about the surgical site.   Radiographs: None Assessment:   1. Ganglion cyst of right foot   2. Exostosis of bone of foot   3. Status post foot surgery    Plan:  Patient was evaluated and treated and all questions answered.  S/p foot surgery right -Progressing as expected post-operatively. -XR: None -WB Status: Weightbearing as tolerated in DME device -Sutures: Intact.  No clinical signs of  infection noted no complication noted. -Medications: None -Foot redressed.  No follow-ups on file.

## 2020-08-30 ENCOUNTER — Other Ambulatory Visit: Payer: Self-pay

## 2020-08-30 ENCOUNTER — Ambulatory Visit (INDEPENDENT_AMBULATORY_CARE_PROVIDER_SITE_OTHER): Payer: Medicare Other | Admitting: Podiatry

## 2020-08-30 ENCOUNTER — Ambulatory Visit: Payer: BC Managed Care – PPO | Admitting: Internal Medicine

## 2020-08-30 DIAGNOSIS — Z9889 Other specified postprocedural states: Secondary | ICD-10-CM

## 2020-08-30 NOTE — Progress Notes (Signed)
   Subjective:  Patient presents today status post excision of ganglion cyst with underlying exostectomy right foot. DOS: 08/17/2020.  Patient states she is doing very well.  She has minimal pain or tenderness associated to the area.  She is currently walking/weightbearing in sandals today.  No new complaints at this time  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   Neuromuscular disorder ([place])    Pre-diabetes    Prediabetes 11/10/2018   Shortness of breath dyspnea       Objective/Physical Exam Neurovascular status intact.  Skin incisions appear to be well coapted with sutures intact. No sign of infectious process noted. No dehiscence. No active bleeding noted. Moderate edema noted to the surgical extremity.   Assessment: 1. s/p excision ganglion cyst with underlying exostectomy RT foot. DOS: 08/17/2020   Plan of Care:  1. Patient was evaluated.  Sutures removed 2.  Patient may now resume full activity no restrictions 3.  Recommend good supportive shoe gear that does not constrict or irritate the midfoot 4.  Return to clinic as needed   Edrick Kins, DPM [facility]  Dr. Edrick Kins, DPM    [address], [place] [address]                Office [phone]  Fax [phone]

## 2020-09-01 ENCOUNTER — Ambulatory Visit (INDEPENDENT_AMBULATORY_CARE_PROVIDER_SITE_OTHER): Payer: Medicare Other | Admitting: Internal Medicine

## 2020-09-01 ENCOUNTER — Other Ambulatory Visit: Payer: Self-pay

## 2020-09-01 ENCOUNTER — Encounter (HOSPITAL_BASED_OUTPATIENT_CLINIC_OR_DEPARTMENT_OTHER): Payer: Self-pay | Admitting: Internal Medicine

## 2020-09-01 VITALS — BP 130/80 | HR 97 | Ht 64.0 in | Wt 237.0 lb

## 2020-09-01 DIAGNOSIS — R931 Abnormal findings on diagnostic imaging of heart and coronary circulation: Secondary | ICD-10-CM

## 2020-09-01 DIAGNOSIS — E782 Mixed hyperlipidemia: Secondary | ICD-10-CM | POA: Diagnosis not present

## 2020-09-01 DIAGNOSIS — I10 Essential (primary) hypertension: Secondary | ICD-10-CM

## 2020-09-01 NOTE — Progress Notes (Signed)
LIPID CLINIC CONSULT NOTE  Chief Complaint:  Manage dyslipidemia  Primary Care Physician: Girtha Rm, NP-C  Primary Cardiologist:  None  HPI:  Lauren Briggs is a 65 y.o. female who is being seen today for the evaluation of dyslipidemia at the request of Raenette Rover, Vickie L, NP-C.  This is a pleasant 65 year old female kindly referred by Dr. Gwenlyn Found for evaluation management of dyslipidemia.  She has a history of known coronary artery calcification with a calcium score 34, 81st percentile for age and sex matched control as of December 21, 2018.  Her most recent lipids showed total cholesterol 223, triglycerides 335, HDL 52 and LDL 113.  Her previous lipid profile showed triglycerides of almost 600 and LDL of 164.  Subsequently she has been on Vascepa 2 g twice daily in addition to simvastatin 20 mg nightly.  Overall she is tolerating the medicines well.  In addition she has been followed at the weight management center.  She has started to lose some weight although her BMI still is 40.  07/01/2019  Lauren Briggs is seen today in follow-up.  She has responded with the addition of ezetimibe and her lipids are lower.  Total cholesterol is now 197 down from 223, triglycerides 300 from 335 and LDL of 88 from 113.  Across the board this is a good response however triglycerides still remain elevated.  We discussed possibility of adding a fibrate although there is little evidence for additional cardiovascular risk reduction there is some evidence in select population such as diabetics for possible benefit.  I would recommend we trial her with fenofibrate 134 mg daily.  Repeat lipids in 3 to 4 months.  11/22/2019  Lauren Briggs is seen today in follow-up.  She seems to be tolerating the addition of fenofibrate to her lipids.  Unfortunately we have not had her lipids reassessed.  I think this was a communication issue.  We will need to go ahead and recheck fasting lipids.  She said she can get it done this week  as she is getting ready to head out of town to [place].  09/01/2020  Lauren Briggs is seen today in follow-up.  Although she was advised to have her lipids assessed before this visit, she said she had family visiting in town was unable to do that.  She was fasting today.  She will go ahead and get lab work today and we will reach out to her with those results and recommendations based on that.  PMHx:  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   Neuromuscular disorder ([place])    Pre-diabetes    Prediabetes 11/10/2018   Shortness of breath dyspnea     Past Surgical History:  Procedure Laterality Date   ABDOMINAL HYSTERECTOMY     PARTIAL   ANTERIOR LAT LUMBAR FUSION N/A 08/14/2018   Procedure: extreme lateral interbody fusion - Lumbar two-Lumbar three  cortical screws and removal pedicle screws Lumbar -Sacral one;  Surgeon: Kary Kos, MD;  Location: [facility];  Service: Neurosurgery;  Laterality: N/A;   APPENDECTOMY     BACK SURGERY     times 4   CARPAL TUNNEL RELEASE     BIL  CESAREAN SECTION     PARTIAL HYST   CLOSED REDUCTION ULNAR SHAFT     COLONOSCOPY     FOOT SURGERY     LT FOOT CYST REMOVED    HARDWARE REMOVAL N/A 08/14/2018   Procedure: Removal pedicle screws Lumbar -Sacral one with cortical screw fixation lumbar two-three;  Surgeon: Kary Kos, MD;  Location: [facility];  Service: Neurosurgery;  Laterality: N/A;   NASAL SINUS SURGERY     1994    radial head Right 01/14/2016   at [facility] Right 01/26/2016   Procedure: RIGHT RADIAL HEAD REPLACEMENT ARTHROPLASTY WITH LIGAMENT REPAIRS;  Surgeon: Charlotte Crumb, MD;  Location: [facility];  Service: Orthopedics;  Laterality: Right;  Right elbow raial head  replacement with possible ligament repairs as needed    UPPER GASTROINTESTINAL ENDOSCOPY      FAMHx:  Family History  Problem Relation Age of Onset   Dementia Mother    Heart attack Mother    Hypertension Mother    Heart disease Mother    Hyperlipidemia Mother    Heart attack Father 61   Heart disease Father    Sudden death Father    Hyperlipidemia Father    Heart attack Maternal Grandmother 91   Stroke Maternal Grandmother    Colon cancer Neg Hx    Colon polyps Neg Hx    Esophageal cancer Neg Hx    Stomach cancer Neg Hx    Rectal cancer Neg Hx     SOCHx:   reports that she quit smoking about 41 years ago. Her smoking use included cigarettes. She quit smokeless tobacco use about 41 years ago. She reports current alcohol use. She reports that she does not use drugs.  ALLERGIES:  Allergies  Allergen Reactions   Iodine Nausea And Vomiting    During a myelogram    Propoxyphene Hcl Palpitations    darvon     ROS: Pertinent items noted in HPI and remainder of comprehensive ROS otherwise negative.  HOME MEDS: Current Outpatient Medications on File Prior to Visit  Medication Sig Dispense Refill   acetaminophen (TYLENOL) 500 MG tablet Take 1,000 mg by mouth every 8 (eight) hours as needed for moderate pain or headache.     Coenzyme Q10 (COQ-10) 100 MG CAPS Take 100 mg by mouth in the morning and at bedtime. otc 30 capsule 0   estradiol (ESTRACE) 1 MG tablet Take 1 mg by mouth every morning.      ezetimibe (ZETIA) 10 MG tablet TAKE 1 TABLET(10 MG) BY MOUTH DAILY 90 tablet 3   fenofibrate micronized (LOFIBRA) 134 MG capsule TAKE 1 CAPSULE(134 MG) BY MOUTH DAILY BEFORE BREAKFAST 90 capsule 3   lisinopril-hydrochlorothiazide (ZESTORETIC) 20-12.5 MG tablet TAKE 1 TABLET BY MOUTH DAILY 90 tablet 1   Multiple Vitamin (MULTIVITAMIN WITH MINERALS) TABS Take 1 tablet by mouth daily after supper. Women's 50 plus     simvastatin (ZOCOR) 20 MG tablet TAKE 1 TABLET(20 MG) BY MOUTH AT  BEDTIME 90 tablet 3   VASCEPA 1 g capsule TAKE 2 CAPSULES(2 GRAMS) BY MOUTH TWICE DAILY 360 capsule 3   cyclobenzaprine (FLEXERIL) 10 MG tablet Take 1 tablet (10 mg total) by mouth 3 (three) times daily as needed for muscle spasms. (Patient not taking: Reported on 09/01/2020) 30 tablet 0   DULoxetine (CYMBALTA) 60 MG capsule Take 60 mg by mouth at bedtime.  (Patient not taking: Reported on 09/01/2020)     Estradiol 10 MCG TABS vaginal  tablet Place 10 mcg vaginally every Monday, Wednesday, and Friday. (Patient not taking: Reported on 09/01/2020)     fexofenadine (ALLEGRA) 180 MG tablet Take 180 mg by mouth daily. (Patient not taking: Reported on 09/01/2020)     oxyCODONE-acetaminophen (PERCOCET) 5-325 MG tablet Take 1 tablet by mouth every 4 (four) hours as needed for severe pain. (Patient not taking: Reported on 09/01/2020) 30 tablet 0   No current facility-administered medications on file prior to visit.    LABS/IMAGING: No results found for this or any previous visit (from the past 48 hour(s)). No results found.  LIPID PANEL:    Component Value Date/Time   CHOL 215 (H) 11/29/2019 0945   TRIG 143 11/29/2019 0945   HDL 78 11/29/2019 0945   CHOLHDL 2.8 11/29/2019 0945   CHOLHDL 4.1 09/01/2014 0933   VLDL 55 (H) 09/01/2014 0933   LDLCALC 112 (H) 11/29/2019 0945    WEIGHTS: Wt Readings from Last 3 Encounters:  09/01/20 237 lb (107.5 kg)  08/03/20 242 lb (109.8 kg)  05/25/20 242 lb (109.8 kg)    VITALS: BP 130/80   Pulse 97   Ht '5\' 4"'$  (1.626 m)   Wt 237 lb (107.5 kg)   SpO2 94%   BMI 40.68 kg/m   EXAM: Deferred  EKG: Deferred  ASSESSMENT: Mixed dyslipidemia, goal LDL less than 70 Coronary artery calcification-CAC score 69 Morbid obesity Hypertension  PLAN: 1.   Lauren Briggs has been doing well with her medications.  She feels like she is lost some weight although is not reflected in the readings today.  She is working with a [facility] weight loss center.  She will need  reassessment of her lipids.  She is fasting today and I advised her where she can get that drawn as the lab at [facility] is not yet open.  Blood pressure is well controlled.  Follow-up annually or sooner as necessary.  Pixie Casino, MD, [facility], [place] Director of the Advanced Lipid Disorders &  Cardiovascular Risk Reduction Clinic Diplomate of the American Board of Clinical Lipidology Attending Cardiologist  Direct Dial: [phone]  Fax: [phone]  Website:  [email or web address] 09/01/2020, 11:00 AM

## 2020-09-01 NOTE — Patient Instructions (Signed)
Medication Instructions:  Your physician recommends that you continue on your current medications as directed. Please refer to the Current Medication list given to you today.  *If you need a refill on your cardiac medications before your next appointment, please call your pharmacy*   Lab Work: FASTING lab work today   [facility] lab work to check cholesterol in about 1 year -- complete before next appointment   If you have labs (blood work) drawn today and your tests are completely normal, you will receive your results only by: [employer] (if you have MyChart) OR A paper copy in the mail If you have any lab test that is abnormal or we need to change your treatment, we will call you to review the results.   Testing/Procedures: NONE   Follow-Up: At [facility], you and your health needs are our priority.  As part of our continuing mission to provide you with exceptional heart care, we have created designated Provider Care Teams.  These Care Teams include your primary Cardiologist (physician) and Advanced Practice Providers (APPs -  Physician Assistants and Nurse Practitioners) who all work together to provide you with the care you need, when you need it.  We recommend signing up for the patient portal called "MyChart".  Sign up information is provided on this After Visit Summary.  MyChart is used to connect with patients for Virtual Visits (Telemedicine).  Patients are able to view lab/test results, encounter notes, upcoming appointments, etc.  Non-urgent messages can be sent to your provider as well.   To learn more about what you can do with MyChart, go to [email or web address].    Your next appointment:   12 month(s) - lipid clinic  The format for your next appointment:   In Person  Provider:   K. Mali Hilty, MD   Other Instructions

## 2020-09-02 LAB — LIPID PANEL
Chol/HDL Ratio: 3.7 ratio (ref 0.0–4.4)
Cholesterol, Total: 183 mg/dL (ref 100–199)
HDL: 50 mg/dL (ref >39)
LDL Chol Calc (NIH): 99 mg/dL (ref 0–99)
Triglycerides: 201 mg/dL — ABNORMAL HIGH (ref 0–149)
VLDL Cholesterol Cal: 34 mg/dL (ref 5–40)

## 2020-09-04 ENCOUNTER — Ambulatory Visit (INDEPENDENT_AMBULATORY_CARE_PROVIDER_SITE_OTHER): Payer: Medicare Other | Admitting: Family Medicine

## 2020-09-04 ENCOUNTER — Encounter (INDEPENDENT_AMBULATORY_CARE_PROVIDER_SITE_OTHER): Payer: Self-pay | Admitting: Family Medicine

## 2020-09-04 ENCOUNTER — Other Ambulatory Visit: Payer: Self-pay

## 2020-09-04 VITALS — BP 136/74 | HR 77 | Temp 98.3°F | Ht 64.0 in | Wt 234.0 lb

## 2020-09-04 DIAGNOSIS — Z6841 Body Mass Index (BMI) 40.0 and over, adult: Secondary | ICD-10-CM

## 2020-09-04 DIAGNOSIS — M79671 Pain in right foot: Secondary | ICD-10-CM

## 2020-09-04 DIAGNOSIS — E8881 Metabolic syndrome: Secondary | ICD-10-CM

## 2020-09-06 NOTE — Progress Notes (Signed)
Chief Complaint:   OBESITY Lauren Briggs is here to discuss her progress with her obesity treatment plan along with follow-up of her obesity related diagnoses.   Today's visit was #: 21 Starting weight: 234 lbs Starting date: 03/01/2020 Today's weight: 234 lbs Today's date: 09/04/2020 Weight change since last visit: -8 lbs Total lbs lost to date: 0 Body mass index is 40.17 kg/m.   Current Meal Plan: practicing portion control and making smarter food choices, such as increasing vegetables and decreasing simple carbohydrates for 100% of the time.  Current Exercise Plan: None at this time.  Interim History: Lauren Briggs reports that her home scale has her down 11 pounds. She recently has surgery on her right foot. She will see Dr. Saintclair Briggs this week and ask for a head CT. [facility] with Dr. Leonides Briggs (Chiropractor) - 60 day program - very low carbohydrates, high protein, and 64+ ounces of water with a goal of 900 calories a day and fasting till 11 AM - taking Lipotropic drops - weekly visits - cost $1450 for the 60 days. A Google search for Lipotropic drops reveals many supplements with differing ingredients. The most likely: Vitamin 123456, Vitamin 6, Folic Acid, Chromium Polynicotinate.  Assessment/Plan:   1. Metabolic syndrome Starting goal: Lose 7-10% of starting weight. She will continue to focus on protein-rich, low simple carbohydrate foods. We reviewed the importance of hydration, regular exercise for stress reduction, and restorative sleep.  We will continue to check lab work every 3 months, with 10% weight loss, or should any other concerns arise.  2. Right foot pain Improving. We will continue to monitor symptoms as they relate to her weight loss journey.  3. Obesity with current BMI Of 40.3 Course: Lauren Briggs is currently in the action stage of change. As such, her goal is to continue with weight loss efforts.   Nutrition goals: She has agreed to practicing portion control and  making smarter food choices, such as increasing vegetables and decreasing simple carbohydrates.   Exercise goals: As is.  Behavioral modification strategies: planning for success.  Lauren Briggs has agreed to follow-up with our clinic in 4 weeks. She was informed of the importance of frequent follow-up visits to maximize her success with intensive lifestyle modifications for her multiple health conditions.   Objective:   Blood pressure 136/74, pulse 77, temperature 98.3 F (36.8 C), temperature source Oral, height '5\' 4"'$  (1.626 m), weight 234 lb (106.1 kg), SpO2 96 %. Body mass index is 40.17 kg/m.  General: Cooperative, alert, well developed, in no acute distress. HEENT: Conjunctivae and lids unremarkable. Cardiovascular: Regular rhythm.  Lungs: Normal work of breathing. Neurologic: No focal deficits.   Lab Results  Component Value Date   CREATININE 0.73 12/20/2019   BUN 25 (H) 12/20/2019   NA 138 12/20/2019   K 4.1 12/20/2019   CL 103 12/20/2019   CO2 26 12/20/2019   Lab Results  Component Value Date   ALT 22 03/02/2019   AST 24 03/02/2019   ALKPHOS 60 03/02/2019   BILITOT 0.3 03/02/2019   Lab Results  Component Value Date   HGBA1C 6.1 (H) 06/14/2019   HGBA1C 6.3 (H) 03/02/2019   HGBA1C 6.3 (H) 11/09/2018   Lab Results  Component Value Date   INSULIN 8.6 03/02/2019   Lab Results  Component Value Date   TSH 1.420 03/02/2019   Lab Results  Component Value Date   CHOL 183 09/01/2020   HDL 50 09/01/2020   LDLCALC 99 09/01/2020  TRIG 201 (H) 09/01/2020   CHOLHDL 3.7 09/01/2020   Lab Results  Component Value Date   VD25OH 42.5 03/02/2019   Lab Results  Component Value Date   WBC 9.0 12/20/2019   HGB 13.2 12/20/2019   HCT 41.5 12/20/2019   MCV 95.8 12/20/2019   PLT 356 12/20/2019   Lab Results  Component Value Date   IRON 91 03/02/2019   TIBC 480 (H) 03/02/2019   FERRITIN 101 03/02/2019    Obesity Behavioral Intervention:   Approximately 15 minutes  were spent on the discussion below.  ASK: We discussed the diagnosis of obesity with Lauren Briggs today and Lauren Briggs agreed to give [place] permission to discuss obesity behavioral modification therapy today.  ASSESS: Lauren Briggs has the diagnosis of obesity and her BMI today is 40.3. Lauren Briggs is in the action stage of change.   ADVISE: Lauren Briggs was educated on the multiple health risks of obesity as well as the benefit of weight loss to improve her health. She was advised of the need for long term treatment and the importance of lifestyle modifications to improve her current health and to decrease her risk of future health problems.  AGREE: Multiple dietary modification options and treatment options were discussed and Lauren Briggs agreed to follow the recommendations documented in the above note.  ARRANGE: Lauren Briggs was educated on the importance of frequent visits to treat obesity as outlined per CMS and USPSTF guidelines and agreed to schedule her next follow up appointment today.  Attestation Statements:   Reviewed by clinician on day of visit: allergies, medications, problem list, medical history, surgical history, family history, social history, and previous encounter notes.  Lauren Briggs, CMA, am acting as Location manager for [employer], DO.  I have reviewed the above documentation for accuracy and completeness, and I agree with the above. Lauren Deutscher, DO

## 2020-09-07 ENCOUNTER — Other Ambulatory Visit: Payer: Self-pay | Admitting: *Deleted

## 2020-09-07 DIAGNOSIS — Z6841 Body Mass Index (BMI) 40.0 and over, adult: Secondary | ICD-10-CM | POA: Insufficient documentation

## 2020-09-07 DIAGNOSIS — E782 Mixed hyperlipidemia: Secondary | ICD-10-CM

## 2020-09-11 ENCOUNTER — Ambulatory Visit (INDEPENDENT_AMBULATORY_CARE_PROVIDER_SITE_OTHER): Payer: Medicare Other

## 2020-09-11 ENCOUNTER — Other Ambulatory Visit: Payer: Self-pay

## 2020-09-11 ENCOUNTER — Ambulatory Visit (INDEPENDENT_AMBULATORY_CARE_PROVIDER_SITE_OTHER): Payer: Medicare Other | Admitting: Podiatry

## 2020-09-11 DIAGNOSIS — M67471 Ganglion, right ankle and foot: Secondary | ICD-10-CM

## 2020-09-11 DIAGNOSIS — Z9889 Other specified postprocedural states: Secondary | ICD-10-CM

## 2020-09-11 MED ORDER — GABAPENTIN 300 MG PO CAPS: 300.0000 mg | ORAL_CAPSULE | Freq: Three times a day (TID) | ORAL | 1 refills | Status: DC

## 2020-09-11 MED ORDER — HYDROCODONE-ACETAMINOPHEN 10-325 MG PO TABS: 1.0000 | ORAL_TABLET | Freq: Four times a day (QID) | ORAL | 0 refills | Status: AC | PRN

## 2020-09-11 MED ORDER — MELOXICAM 15 MG PO TABS: 15.0000 mg | ORAL_TABLET | Freq: Every day | ORAL | 1 refills | Status: DC

## 2020-09-11 NOTE — Progress Notes (Signed)
   Subjective:  Patient presents today status post excision of ganglion cyst with underlying exostectomy right foot. DOS: 08/17/2020.  Patient states that over the past week she has noticed a significant increase in pain and tenderness to the right surgical foot.  Last visit on 08/30/2020 she was doing very well however since then her foot has increased in pain and tenderness and swelling.  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  Skin incisions appear to be well coapted and healed.  No infectious process noted.  No erythema noted.  There is some moderate edema.  There is pain with light touch along the dorsum of the foot.  Radiographic exam Normal osseous mineralization.  Joint spaces preserved.  The osteotomy and craterization of the midtarsal exostosis appear stable.  Assessment: 1. s/p excision ganglion cyst with underlying exostectomy RT foot. DOS: 08/17/2020   Plan of Care:  1. Patient was evaluated.  2.  Compression ankle sleeve dispensed.  Wear daily 3.  Prescription for gabapentin 300 mg 3 times daily 4.  Prescription for meloxicam 15 mg daily 5.  Prescription for Vicodin 10/325 mg every 6 hours as needed pain 6.  Resume postsurgical shoe as needed 7.  Return to clinic in 3 weeks, if there is no improvement we may need to provide/administer a steroid injection into the area to decrease the inflammation   Edrick Kins, DPM [facility]  Dr. Edrick Kins, DPM    [address], [place] [address]                Office [phone]  Fax [phone]

## 2020-09-13 ENCOUNTER — Encounter: Payer: Medicare Other | Admitting: Podiatry

## 2020-09-14 ENCOUNTER — Encounter (INDEPENDENT_AMBULATORY_CARE_PROVIDER_SITE_OTHER): Payer: Self-pay | Admitting: Family Medicine

## 2020-09-15 ENCOUNTER — Other Ambulatory Visit: Payer: Self-pay | Admitting: Neurosurgery

## 2020-09-15 DIAGNOSIS — R413 Other amnesia: Secondary | ICD-10-CM

## 2020-09-18 ENCOUNTER — Telehealth: Payer: Self-pay | Admitting: Internal Medicine

## 2020-09-18 DIAGNOSIS — E782 Mixed hyperlipidemia: Secondary | ICD-10-CM

## 2020-09-18 NOTE — Telephone Encounter (Signed)
Thanks for notifying me - will recheck lipids in 3 months based on her plan  Dr Lemmie Evens

## 2020-09-18 NOTE — Telephone Encounter (Addendum)
Spoke with pt and does not want to continue the Vascepa at this time per pt price is now $560.00 for 3 mo supply and cannot afford this Per pt is in a different tier now and pt does not wish to try the injectable cholesterol med either Pt is wanting to try diet and exercise and recheck values with just taking the Simvastatin and Zetia Per pt has already lost 8 lbs Pt will recheck lipids in 3 months Will forward to Dr Debara Pickett for review .Adonis Housekeeper

## 2020-09-18 NOTE — Telephone Encounter (Signed)
Pt is calling to speak with the nurse in regards to medication that her insurance will not pay for

## 2020-09-28 ENCOUNTER — Other Ambulatory Visit: Payer: Self-pay

## 2020-09-28 ENCOUNTER — Ambulatory Visit
Admission: RE | Admit: 2020-09-28 | Discharge: 2020-09-28 | Disposition: A | Discharge status: Home / Self Care | Payer: Medicare Other | Source: Ambulatory Visit | Attending: Neurosurgery | Admitting: Neurosurgery

## 2020-09-28 DIAGNOSIS — R413 Other amnesia: Secondary | ICD-10-CM

## 2020-10-02 ENCOUNTER — Other Ambulatory Visit: Payer: Self-pay

## 2020-10-02 ENCOUNTER — Ambulatory Visit (INDEPENDENT_AMBULATORY_CARE_PROVIDER_SITE_OTHER): Payer: Medicare Other | Admitting: Podiatry

## 2020-10-02 DIAGNOSIS — Z9889 Other specified postprocedural states: Secondary | ICD-10-CM

## 2020-10-02 NOTE — Progress Notes (Signed)
   Subjective:  Patient presents today status post excision of ganglion cyst with underlying exostectomy right foot. DOS: 08/17/2020.  Patient states that she is doing significantly better.  She has no pain associated to the area.  She has been full activity with no restrictions doing well with no symptoms.  She says that the gabapentin and meloxicam helped significantly.  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  Skin incisions appear to be well coapted and healed.  No infectious process noted.  No erythema noted.  Negative for edema.  Negative for any tenderness to palpation either.   Assessment: 1. s/p excision ganglion cyst with underlying exostectomy RT foot. DOS: 08/17/2020   Plan of Care:  1. Patient was evaluated.  2.  Patient states that the compression sleeve helped significantly.  Continue as needed 3.  Recommend good supportive shoes 4.  Patient may resume full activity no restrictions 5.  Return to clinic as needed   Edrick Kins, DPM [facility]  Dr. Edrick Kins, DPM    [address] [place], [place] [address]                Office [phone]  Fax [phone]

## 2020-10-03 ENCOUNTER — Other Ambulatory Visit: Payer: Self-pay | Admitting: Neurosurgery

## 2020-10-03 DIAGNOSIS — I639 Cerebral infarction, unspecified: Secondary | ICD-10-CM | POA: Insufficient documentation

## 2020-10-03 DIAGNOSIS — Z8673 Personal history of transient ischemic attack (TIA), and cerebral infarction without residual deficits: Secondary | ICD-10-CM | POA: Insufficient documentation

## 2020-10-10 ENCOUNTER — Ambulatory Visit (INDEPENDENT_AMBULATORY_CARE_PROVIDER_SITE_OTHER): Payer: Medicare Other | Admitting: Family Medicine

## 2020-10-10 ENCOUNTER — Encounter (INDEPENDENT_AMBULATORY_CARE_PROVIDER_SITE_OTHER): Payer: Self-pay | Admitting: Family Medicine

## 2020-10-10 ENCOUNTER — Other Ambulatory Visit: Payer: Self-pay

## 2020-10-10 VITALS — BP 128/73 | HR 81 | Temp 97.7°F | Ht 64.0 in | Wt 228.0 lb

## 2020-10-10 DIAGNOSIS — R7301 Impaired fasting glucose: Secondary | ICD-10-CM

## 2020-10-10 DIAGNOSIS — E7849 Other hyperlipidemia: Secondary | ICD-10-CM | POA: Diagnosis not present

## 2020-10-10 DIAGNOSIS — Z6841 Body Mass Index (BMI) 40.0 and over, adult: Secondary | ICD-10-CM

## 2020-10-10 DIAGNOSIS — E8881 Metabolic syndrome: Secondary | ICD-10-CM | POA: Diagnosis not present

## 2020-10-10 DIAGNOSIS — I639 Cerebral infarction, unspecified: Secondary | ICD-10-CM

## 2020-10-10 DIAGNOSIS — I10 Essential (primary) hypertension: Secondary | ICD-10-CM | POA: Diagnosis not present

## 2020-10-12 ENCOUNTER — Encounter (INDEPENDENT_AMBULATORY_CARE_PROVIDER_SITE_OTHER): Payer: Self-pay | Admitting: Family Medicine

## 2020-10-12 MED ORDER — OZEMPIC (0.25 OR 0.5 MG/DOSE) 2 MG/1.5ML ~~LOC~~ SOPN: 0.5000 mg | PEN_INJECTOR | SUBCUTANEOUS | 0 refills | Status: AC

## 2020-10-12 NOTE — Telephone Encounter (Signed)
Last OV with Dr Wallace 

## 2020-10-16 NOTE — Progress Notes (Signed)
Chief Complaint:   OBESITY Lauren Briggs is here to discuss her progress with her obesity treatment plan along with follow-up of her obesity related diagnoses.   Today's visit was #: 22 Starting weight: 234 lbs Starting date: 03/01/2020 Today's weight: 228 lbs Today's date: 10/10/2020 Weight change since last visit: 6 lbs Total lbs lost to date: 6 lbs Body mass index is 39.14 kg/m.  Total weight loss percentage to date: -2.56%  Current Meal Plan: practicing portion control and making smarter food choices, such as increasing vegetables and decreasing simple carbohydrates for 99% of the time.  Current Exercise Plan: None.  Interim History:  Shakirah says she is happy with her current success.  She is open to a discussion about medication.  Assessment/Plan:   1. Impaired fasting glucose, with polyphagia Controlled. Current treatment: None. She will continue to focus on protein-rich, low simple carbohydrate foods. We reviewed the importance of hydration, regular exercise for stress reduction, and restorative sleep.  Plan:  Start Ozempic 0.25 mg subcutaneously weekly.  - Start Semaglutide,0.25 or 0.5MG /DOS, (OZEMPIC, 0.25 OR 0.5 MG/DOSE,) 2 MG/1.5ML SOPN; Inject 0.5 mg into the skin once a week.  Dispense: 1.5 mL; Refill: 0  2. Metabolic syndrome Starting goal: Lose 7-10% of starting weight. She will continue to focus on protein-rich, low simple carbohydrate foods. We reviewed the importance of hydration, regular exercise for stress reduction, and restorative sleep.  We will continue to check lab work every 3 months, with 10% weight loss, or should any other concerns arise.  - Start Semaglutide,0.25 or 0.5MG /DOS, (OZEMPIC, 0.25 OR 0.5 MG/DOSE,) 2 MG/1.5ML SOPN; Inject 0.5 mg into the skin once a week.  Dispense: 1.5 mL; Refill: 0  3. Other hyperlipidemia Course: Not optimized. Lipid-lowering medications: Vascepa 2 g twice daily, Zocor 20 mg daily, Fenofibrate 134 mg daily, Zetia 10 mg daily.   Will be discontinuing Vascepa in the next few weeks due to cost.  Encouraged to check into patient assistance.  Plan: Dietary changes: Increase soluble fiber, decrease simple carbohydrates, decrease saturated fat. Exercise changes: Moderate to vigorous-intensity aerobic activity 150 minutes per week or as tolerated. We will continue to monitor along with PCP/specialists as it pertains to her weight loss journey.  Lab Results  Component Value Date   CHOL 183 09/01/2020   HDL 50 09/01/2020   LDLCALC 99 09/01/2020   TRIG 201 (H) 09/01/2020   CHOLHDL 3.7 09/01/2020   Lab Results  Component Value Date   ALT 22 03/02/2019   AST 24 03/02/2019   ALKPHOS 60 03/02/2019   BILITOT 0.3 03/02/2019   4. Essential hypertension At goal. Medications: Zestoretic 20-12.5 mg daily.   Plan: Avoid buying foods that are: processed, frozen, or prepackaged to avoid excess salt. We will watch for signs of hypotension as she continues lifestyle modifications.  BP Readings from Last 3 Encounters:  10/10/20 128/73  09/04/20 136/74  09/01/20 130/80   Lab Results  Component Value Date   CREATININE 0.73 12/20/2019   5. Obesity, with current BMI of 39.2  Course: Lauren Briggs is currently in the action stage of change. As such, her goal is to continue with weight loss efforts.   Nutrition goals: She has agreed to practicing portion control and making smarter food choices, such as increasing vegetables and decreasing simple carbohydrates.   Exercise goals:  Increase NEAT.  Behavioral modification strategies: increasing lean protein intake, decreasing simple carbohydrates, increasing vegetables, and increasing water intake.  Kristien has agreed to follow-up with our clinic  in 4 weeks. She was informed of the importance of frequent follow-up visits to maximize her success with intensive lifestyle modifications for her multiple health conditions.   Objective:   Blood pressure 128/73, pulse 81, temperature 97.7 F  (36.5 C), temperature source Oral, height 5\' 4"  (1.626 m), weight 228 lb (103.4 kg), SpO2 96 %. Body mass index is 39.14 kg/m.  General: Cooperative, alert, well developed, in no acute distress. HEENT: Conjunctivae and lids unremarkable. Cardiovascular: Regular rhythm.  Lungs: Normal work of breathing. Neurologic: No focal deficits.   Lab Results  Component Value Date   CREATININE 0.73 12/20/2019   BUN 25 (H) 12/20/2019   NA 138 12/20/2019   K 4.1 12/20/2019   CL 103 12/20/2019   CO2 26 12/20/2019   Lab Results  Component Value Date   ALT 22 03/02/2019   AST 24 03/02/2019   ALKPHOS 60 03/02/2019   BILITOT 0.3 03/02/2019   Lab Results  Component Value Date   HGBA1C 6.1 (H) 06/14/2019   HGBA1C 6.3 (H) 03/02/2019   HGBA1C 6.3 (H) 11/09/2018   Lab Results  Component Value Date   INSULIN 8.6 03/02/2019   Lab Results  Component Value Date   TSH 1.420 03/02/2019   Lab Results  Component Value Date   CHOL 183 09/01/2020   HDL 50 09/01/2020   LDLCALC 99 09/01/2020   TRIG 201 (H) 09/01/2020   CHOLHDL 3.7 09/01/2020   Lab Results  Component Value Date   VD25OH 42.5 03/02/2019   Lab Results  Component Value Date   WBC 9.0 12/20/2019   HGB 13.2 12/20/2019   HCT 41.5 12/20/2019   MCV 95.8 12/20/2019   PLT 356 12/20/2019   Lab Results  Component Value Date   IRON 91 03/02/2019   TIBC 480 (H) 03/02/2019   FERRITIN 101 03/02/2019   Attestation Statements:   Reviewed by clinician on day of visit: allergies, medications, problem list, medical history, surgical history, family history, social history, and previous encounter notes.  Time spent on visit including pre-visit chart review and post-visit care and charting was 50 minutes.   I, Water quality scientist, CMA, am acting as transcriptionist for Briscoe Deutscher, DO  I have reviewed the above documentation for accuracy and completeness, and I agree with the above. Briscoe Deutscher, DO

## 2020-10-19 ENCOUNTER — Ambulatory Visit
Admission: RE | Admit: 2020-10-19 | Discharge: 2020-10-19 | Disposition: A | Discharge status: Home / Self Care | Payer: Medicare Other | Source: Ambulatory Visit | Attending: Neurosurgery | Admitting: Neurosurgery

## 2020-10-19 DIAGNOSIS — I639 Cerebral infarction, unspecified: Secondary | ICD-10-CM

## 2020-10-19 MED ORDER — GADOBENATE DIMEGLUMINE 529 MG/ML IV SOLN
20.0000 mL | Freq: Once | INTRAVENOUS | Status: AC | PRN
  Administered 2020-10-19: 20 mL via INTRAVENOUS

## 2020-10-28 ENCOUNTER — Telehealth: Payer: Self-pay

## 2020-10-28 NOTE — Telephone Encounter (Signed)
Received renewal [place]. VASCEPA, unable to complete no longer has [employer], will call pharmacy see if has rejection

## 2020-11-14 ENCOUNTER — Encounter (INDEPENDENT_AMBULATORY_CARE_PROVIDER_SITE_OTHER): Payer: Self-pay | Admitting: Family Medicine

## 2020-11-14 ENCOUNTER — Ambulatory Visit (INDEPENDENT_AMBULATORY_CARE_PROVIDER_SITE_OTHER): Payer: Medicare Other | Admitting: Family Medicine

## 2020-11-14 ENCOUNTER — Other Ambulatory Visit: Payer: Self-pay

## 2020-11-14 VITALS — BP 133/75 | HR 76 | Temp 97.9°F | Ht 64.0 in | Wt 231.0 lb

## 2020-11-14 DIAGNOSIS — I639 Cerebral infarction, unspecified: Secondary | ICD-10-CM | POA: Diagnosis not present

## 2020-11-14 DIAGNOSIS — Z6841 Body Mass Index (BMI) 40.0 and over, adult: Secondary | ICD-10-CM

## 2020-11-14 DIAGNOSIS — R7301 Impaired fasting glucose: Secondary | ICD-10-CM

## 2020-11-14 MED ORDER — OZEMPIC (1 MG/DOSE) 4 MG/3ML ~~LOC~~ SOPN: 1.0000 mg | PEN_INJECTOR | SUBCUTANEOUS | 0 refills | Status: DC

## 2020-11-16 NOTE — Progress Notes (Signed)
Chief Complaint:   OBESITY Lauren Briggs is here to discuss her progress with her obesity treatment plan along with follow-up of her obesity related diagnoses. See Medical Weight Management Flowsheet for complete bioelectrical impedance results.  Today's visit was #: 23 Starting weight: 234 lbs Starting date: 03/01/2020 Weight change since last visit: +3 lbs Total lbs lost to date: 3 lbs Total weight loss percentage to date: -1.28%  Nutrition Plan: Practicing portion control and making smarter food choices, such as increasing vegetables and decreasing simple carbohydrates for 90% of the time. Activity: None.  Interim History: Lauren Briggs is on Ozempic 0.25 mg subcutaneously weekly.  Assessment/Plan:   1. Impaired fasting glucose, with polyphagia Current treatment: Ozempic 0.25 mg subcutaneously weekly. She will continue to focus on protein-rich, low simple carbohydrate foods. We reviewed the importance of hydration, regular exercise for stress reduction, and restorative sleep.  - Increase Semaglutide, 1 MG/DOSE, (OZEMPIC, 1 MG/DOSE,) 4 MG/3ML SOPN; Inject 1 mg into the skin once a week.  Dispense: 9 mL; Refill: 0  2. Obesity, with current BMI of 39.7  Course: Lauren Briggs is currently in the action stage of change. As such, her goal is to continue with weight loss efforts.   Nutrition goals: She has agreed to practicing portion control and making smarter food choices, such as increasing vegetables and decreasing simple carbohydrates.   Exercise goals:  Increase NEAT.  Behavioral modification strategies: increasing lean protein intake, decreasing simple carbohydrates, increasing vegetables, and increasing water intake.  Lauren Briggs has agreed to follow-up with our clinic in 4 weeks. She was informed of the importance of frequent follow-up visits to maximize her success with intensive lifestyle modifications for her multiple health conditions.   Objective:   Blood pressure 133/75, pulse 76,  temperature 97.9 F (36.6 C), temperature source Oral, height 5\' 4"  (1.626 m), weight 231 lb (104.8 kg), SpO2 98 %. Body mass index is 39.65 kg/m.  General: Cooperative, alert, well developed, in no acute distress. HEENT: Conjunctivae and lids unremarkable. Cardiovascular: Regular rhythm.  Lungs: Normal work of breathing. Neurologic: No focal deficits.   Lab Results  Component Value Date   CREATININE 0.73 12/20/2019   BUN 25 (H) 12/20/2019   NA 138 12/20/2019   K 4.1 12/20/2019   CL 103 12/20/2019   CO2 26 12/20/2019   Lab Results  Component Value Date   ALT 22 03/02/2019   AST 24 03/02/2019   ALKPHOS 60 03/02/2019   BILITOT 0.3 03/02/2019   Lab Results  Component Value Date   HGBA1C 6.1 (H) 06/14/2019   HGBA1C 6.3 (H) 03/02/2019   HGBA1C 6.3 (H) 11/09/2018   Lab Results  Component Value Date   INSULIN 8.6 03/02/2019   Lab Results  Component Value Date   TSH 1.420 03/02/2019   Lab Results  Component Value Date   CHOL 183 09/01/2020   HDL 50 09/01/2020   LDLCALC 99 09/01/2020   TRIG 201 (H) 09/01/2020   CHOLHDL 3.7 09/01/2020   Lab Results  Component Value Date   VD25OH 42.5 03/02/2019   Lab Results  Component Value Date   WBC 9.0 12/20/2019   HGB 13.2 12/20/2019   HCT 41.5 12/20/2019   MCV 95.8 12/20/2019   PLT 356 12/20/2019   Lab Results  Component Value Date   IRON 91 03/02/2019   TIBC 480 (H) 03/02/2019   FERRITIN 101 03/02/2019   Attestation Statements:   Reviewed by clinician on day of visit: allergies, medications, problem list, medical history, surgical  history, family history, social history, and previous encounter notes.  I, Water quality scientist, CMA, am acting as transcriptionist for Briscoe Deutscher, DO  I have reviewed the above documentation for accuracy and completeness, and I agree with the above. -  Briscoe Deutscher, DO, MS, FAAFP, DABOM - Family and Bariatric Medicine.

## 2020-11-22 ENCOUNTER — Telehealth: Payer: Medicare Other | Admitting: Physician Assistant

## 2020-11-22 DIAGNOSIS — B9689 Other specified bacterial agents as the cause of diseases classified elsewhere: Secondary | ICD-10-CM | POA: Diagnosis not present

## 2020-11-22 DIAGNOSIS — J329 Chronic sinusitis, unspecified: Secondary | ICD-10-CM

## 2020-11-22 DIAGNOSIS — I639 Cerebral infarction, unspecified: Secondary | ICD-10-CM

## 2020-11-22 MED ORDER — AMOXICILLIN-POT CLAVULANATE 875-125 MG PO TABS: 1.0000 | ORAL_TABLET | Freq: Two times a day (BID) | ORAL | 0 refills | Status: DC

## 2020-11-22 MED ORDER — OXYMETAZOLINE HCL 0.05 % NA SOLN: 1.0000 | Freq: Two times a day (BID) | NASAL | 0 refills | Status: DC

## 2020-11-22 MED ORDER — PROMETHAZINE-DM 6.25-15 MG/5ML PO SYRP
5.0000 mL | ORAL_SOLUTION | Freq: Four times a day (QID) | ORAL | 0 refills | Status: DC | PRN
Start: 2020-11-22 — End: 2020-12-19

## 2020-11-22 NOTE — Patient Instructions (Signed)
Lauren Briggs, thank you for joining Mar Daring, PA-C for today's virtual visit.  While this provider is not your primary care provider (PCP), if your PCP is located in our provider database this encounter information will be shared with them immediately following your visit.  Consent: (Patient) Lauren Briggs provided verbal consent for this virtual visit at the beginning of the encounter.  Current Medications:  Current Outpatient Medications:    amoxicillin-clavulanate (AUGMENTIN) 875-125 MG tablet, Take 1 tablet by mouth 2 (two) times daily., Disp: 14 tablet, Rfl: 0   oxymetazoline (AFRIN NASAL SPRAY) 0.05 % nasal spray, Place 1 spray into both nostrils 2 (two) times daily., Disp: 30 mL, Rfl: 0   promethazine-dextromethorphan (PROMETHAZINE-DM) 6.25-15 MG/5ML syrup, Take 5 mLs by mouth 4 (four) times daily as needed for cough., Disp: 118 mL, Rfl: 0   acetaminophen (TYLENOL) 500 MG tablet, Take 1,000 mg by mouth every 8 (eight) hours as needed for moderate pain or headache., Disp: , Rfl:    Coenzyme Q10 (COQ-10) 100 MG CAPS, Take 100 mg by mouth in the morning and at bedtime. otc, Disp: 30 capsule, Rfl: 0   estradiol (ESTRACE) 1 MG tablet, Take 1 mg by mouth every morning. , Disp: , Rfl:    ezetimibe (ZETIA) 10 MG tablet, TAKE 1 TABLET(10 MG) BY MOUTH DAILY, Disp: 90 tablet, Rfl: 3   fenofibrate micronized (LOFIBRA) 134 MG capsule, TAKE 1 CAPSULE(134 MG) BY MOUTH DAILY BEFORE BREAKFAST, Disp: 90 capsule, Rfl: 3   fexofenadine (ALLEGRA) 180 MG tablet, Take 180 mg by mouth daily., Disp: , Rfl:    gabapentin (NEURONTIN) 300 MG capsule, Take 1 capsule (300 mg total) by mouth 3 (three) times daily., Disp: 90 capsule, Rfl: 1   lisinopril-hydrochlorothiazide (ZESTORETIC) 20-12.5 MG tablet, TAKE 1 TABLET BY MOUTH DAILY, Disp: 90 tablet, Rfl: 1   Multiple Vitamin (MULTIVITAMIN WITH MINERALS) TABS, Take 1 tablet by mouth daily after supper. Women's 50 plus, Disp: , Rfl:    Semaglutide, 1  MG/DOSE, (OZEMPIC, 1 MG/DOSE,) 4 MG/3ML SOPN, Inject 1 mg into the skin once a week., Disp: 9 mL, Rfl: 0   simvastatin (ZOCOR) 20 MG tablet, TAKE 1 TABLET(20 MG) BY MOUTH AT BEDTIME, Disp: 90 tablet, Rfl: 3   Medications ordered in this encounter:  Meds ordered this encounter  Medications   amoxicillin-clavulanate (AUGMENTIN) 875-125 MG tablet    Sig: Take 1 tablet by mouth 2 (two) times daily.    Dispense:  14 tablet    Refill:  0    Order Specific Question:   Supervising Provider    Answer:   Noemi Chapel [[id number]]   promethazine-dextromethorphan (PROMETHAZINE-DM) 6.25-15 MG/5ML syrup    Sig: Take 5 mLs by mouth 4 (four) times daily as needed for cough.    Dispense:  118 mL    Refill:  0    Order Specific Question:   Supervising Provider    Answer:   Sabra Heck, BRIAN [[id number]]   oxymetazoline (AFRIN NASAL SPRAY) 0.05 % nasal spray    Sig: Place 1 spray into both nostrils 2 (two) times daily.    Dispense:  30 mL    Refill:  0    Order Specific Question:   Supervising Provider    Answer:   Sabra Heck, BRIAN [[id number]]     *If you need refills on other medications prior to your next appointment, please contact your pharmacy*  Follow-Up: Call back or seek an in-person evaluation if the symptoms worsen or if the condition  fails to improve as anticipated.  Other Instructions Sinusitis, Adult Sinusitis is inflammation of your sinuses. Sinuses are hollow spaces in the bones around your face. Your sinuses are located: Around your eyes. In the middle of your forehead. Behind your nose. In your cheekbones. Mucus normally drains out of your sinuses. When your nasal tissues become inflamed or swollen, mucus can become trapped or blocked. This allows bacteria, viruses, and fungi to grow, which leads to infection. Most infections of the sinuses are caused by a virus. Sinusitis can develop quickly. It can last for up to 4 weeks (acute) or for more than 12 weeks (chronic). Sinusitis often develops after a  cold. What are the causes? This condition is caused by anything that creates swelling in the sinuses or stops mucus from draining. This includes: Allergies. Asthma. Infection from bacteria or viruses. Deformities or blockages in your nose or sinuses. Abnormal growths in the nose (nasal polyps). Pollutants, such as chemicals or irritants in the air. Infection from fungi (rare). What increases the risk? You are more likely to develop this condition if you: Have a weak body defense system (immune system). Do a lot of swimming or diving. Overuse nasal sprays. Smoke. What are the signs or symptoms? The main symptoms of this condition are pain and a feeling of pressure around the affected sinuses. Other symptoms include: Stuffy nose or congestion. Thick drainage from your nose. Swelling and warmth over the affected sinuses. Headache. Upper toothache. A cough that may get worse at night. Extra mucus that collects in the throat or the back of the nose (postnasal drip). Decreased sense of smell and taste. Fatigue. A fever. Sore throat. Bad breath. How is this diagnosed? This condition is diagnosed based on: Your symptoms. Your medical history. A physical exam. Tests to find out if your condition is acute or chronic. This may include: Checking your nose for nasal polyps. Viewing your sinuses using a device that has a light (endoscope). Testing for allergies or bacteria. Imaging tests, such as an MRI or CT scan. In rare cases, a bone biopsy may be done to rule out more serious types of fungal sinus disease. How is this treated? Treatment for sinusitis depends on the cause and whether your condition is chronic or acute. If caused by a virus, your symptoms should go away on their own within 10 days. You may be given medicines to relieve symptoms. They include: Medicines that shrink swollen nasal passages (topical intranasal decongestants). Medicines that treat allergies  (antihistamines). A spray that eases inflammation of the nostrils (topical intranasal corticosteroids). Rinses that help get rid of thick mucus in your nose (nasal saline washes). If caused by bacteria, your health care provider may recommend waiting to see if your symptoms improve. Most bacterial infections will get better without antibiotic medicine. You may be given antibiotics if you have: A severe infection. A weak immune system. If caused by narrow nasal passages or nasal polyps, you may need to have surgery. Follow these instructions at home: Medicines Take, use, or apply over-the-counter and prescription medicines only as told by your health care provider. These may include nasal sprays. If you were prescribed an antibiotic medicine, take it as told by your health care provider. Do not stop taking the antibiotic even if you start to feel better. Hydrate and humidify  Drink enough fluid to keep your urine pale yellow. Staying hydrated will help to thin your mucus. Use a cool mist humidifier to keep the humidity level in your home  above 50%. Inhale steam for 10-15 minutes, 3-4 times a day, or as told by your health care provider. You can do this in the bathroom while a hot shower is running. Limit your exposure to cool or dry air. Rest Rest as much as possible. Sleep with your head raised (elevated). Make sure you get enough sleep each night. General instructions  Apply a warm, moist washcloth to your face 3-4 times a day or as told by your health care provider. This will help with discomfort. Wash your hands often with soap and water to reduce your exposure to germs. If soap and water are not available, use hand sanitizer. Do not smoke. Avoid being around people who are smoking (secondhand smoke). Keep all follow-up visits as told by your health care provider. This is important. Contact a health care provider if: You have a fever. Your symptoms get worse. Your symptoms do not  improve within 10 days. Get help right away if: You have a severe headache. You have persistent vomiting. You have severe pain or swelling around your face or eyes. You have vision problems. You develop confusion. Your neck is stiff. You have trouble breathing. Summary Sinusitis is soreness and inflammation of your sinuses. Sinuses are hollow spaces in the bones around your face. This condition is caused by nasal tissues that become inflamed or swollen. The swelling traps or blocks the flow of mucus. This allows bacteria, viruses, and fungi to grow, which leads to infection. If you were prescribed an antibiotic medicine, take it as told by your health care provider. Do not stop taking the antibiotic even if you start to feel better. Keep all follow-up visits as told by your health care provider. This is important. This information is not intended to replace advice given to you by your health care provider. Make sure you discuss any questions you have with your health care provider. Document Revised: 06/09/2017 Document Reviewed: 06/09/2017 Elsevier Patient Education  2022 [employer].    If you have been instructed to have an in-person evaluation today at a local Urgent Care facility, please use the link below. It will take you to a list of all of our available [facility], including address, phone number and hours of operation. Please do not delay care.  [facility]  If you or a family member do not have a primary care provider, use the link below to schedule a visit and establish care. When you choose a [facility] primary care physician or advanced practice provider, you gain a long-term partner in health. Find a Primary Care Provider  Learn more about [facility] in-office and virtual care options: [facility]

## 2020-11-22 NOTE — Progress Notes (Signed)
Virtual Visit Consent   [name], you are scheduled for a virtual visit with a [facility] provider today.     Just as with appointments in the office, your consent must be obtained to participate.  Your consent will be active for this visit and any virtual visit you may have with one of our providers in the next 365 days.     If you have a MyChart account, a copy of this consent can be sent to you electronically.  All virtual visits are billed to your insurance company just like a traditional visit in the office.    As this is a virtual visit, video technology does not allow for your provider to perform a traditional examination.  This may limit your provider's ability to fully assess your condition.  If your provider identifies any concerns that need to be evaluated in person or the need to arrange testing (such as labs, EKG, etc.), we will make arrangements to do so.     Although advances in technology are sophisticated, we cannot ensure that it will always work on either your end or our end.  If the connection with a video visit is poor, the visit may have to be switched to a telephone visit.  With either a video or telephone visit, we are not always able to ensure that we have a secure connection.     I need to obtain your verbal consent now.   Are you willing to proceed with your visit today?    Lauren Briggs has provided verbal consent on 11/22/2020 for a virtual visit (video or telephone).   Mar Daring, PA-C   Date: 11/22/2020 1:59 PM   Virtual Visit via Video Note   I, Mar Daring, connected with  Lauren Briggs  ([id number], [id number]) on 11/22/20 at  1:45 PM EDT by a video-enabled telemedicine application and verified that I am speaking with the correct person using two identifiers.  Location: Patient: Virtual Visit Location Patient: Home Provider: Virtual Visit Location Provider: Home Office   I discussed the limitations of evaluation and management by  telemedicine and the availability of in person appointments. The patient expressed understanding and agreed to proceed.    History of Present Illness: Lauren Briggs is a 65 y.o. who identifies as a female who was assigned female at birth, and is being seen today for possible sinus infection.  HPI: Sinusitis This is a new problem. The current episode started 1 to 4 weeks ago. The problem has been gradually worsening since onset. There has been no fever. The pain is moderate. Associated symptoms include congestion, coughing, headaches and sinus pressure. Pertinent negatives include no chills. Treatments tried: OTC cold and flu. The treatment provided no relief.     Problems:  Patient Active Problem List   Diagnosis Date Noted   Foot pain 02/16/2019   Left leg numbness 01/21/2019   Neuropathy 12/31/2018   Family history of heart disease 12/02/2018   Prediabetes 11/10/2018   Essential hypertension 11/09/2018   Morbid obesity ([place]) 11/09/2018   Claudication in peripheral vascular disease ([place]) 11/03/2018   DDD (degenerative disc disease), lumbosacral 11/03/2018   Spondylosis without myelopathy or radiculopathy, lumbar region 11/03/2018   Spinal stenosis of lumbar region 08/14/2018   Cervical radiculopathy 10/13/2017   Closed displaced fracture of head of right radius 01/25/2016   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[place]) 07/07/2015   Hip pain 02/23/2015   Pseudoarthrosis of lumbar spine 07/11/2014  Mixed hypercholesterolemia and 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   Low back pain radiating to right leg    Lichen sclerosus 82/95/6213   Postmenopausal HRT (hormone replacement therapy) 04/29/2011    Allergies:  Allergies  Allergen Reactions   Versed [Midazolam] Other (See Comments)    Stroke   Iodine Nausea And Vomiting    During a myelogram    Propoxyphene Hcl Palpitations    darvon    Medications:  Current Outpatient Medications:    amoxicillin-clavulanate (AUGMENTIN) 875-125 MG  tablet, Take 1 tablet by mouth 2 (two) times daily., Disp: 14 tablet, Rfl: 0   oxymetazoline (AFRIN NASAL SPRAY) 0.05 % nasal spray, Place 1 spray into both nostrils 2 (two) times daily., Disp: 30 mL, Rfl: 0   promethazine-dextromethorphan (PROMETHAZINE-DM) 6.25-15 MG/5ML syrup, Take 5 mLs by mouth 4 (four) times daily as needed for cough., Disp: 118 mL, Rfl: 0   acetaminophen (TYLENOL) 500 MG tablet, Take 1,000 mg by mouth every 8 (eight) hours as needed for moderate pain or headache., Disp: , Rfl:    Coenzyme Q10 (COQ-10) 100 MG CAPS, Take 100 mg by mouth in the morning and at bedtime. otc, Disp: 30 capsule, Rfl: 0   estradiol (ESTRACE) 1 MG tablet, Take 1 mg by mouth every morning. , Disp: , Rfl:    ezetimibe (ZETIA) 10 MG tablet, TAKE 1 TABLET(10 MG) BY MOUTH DAILY, Disp: 90 tablet, Rfl: 3   fenofibrate micronized (LOFIBRA) 134 MG capsule, TAKE 1 CAPSULE(134 MG) BY MOUTH DAILY BEFORE BREAKFAST, Disp: 90 capsule, Rfl: 3   fexofenadine (ALLEGRA) 180 MG tablet, Take 180 mg by mouth daily., Disp: , Rfl:    gabapentin (NEURONTIN) 300 MG capsule, Take 1 capsule (300 mg total) by mouth 3 (three) times daily., Disp: 90 capsule, Rfl: 1   lisinopril-hydrochlorothiazide (ZESTORETIC) 20-12.5 MG tablet, TAKE 1 TABLET BY MOUTH DAILY, Disp: 90 tablet, Rfl: 1   Multiple Vitamin (MULTIVITAMIN WITH MINERALS) TABS, Take 1 tablet by mouth daily after supper. Women's 50 plus, Disp: , Rfl:    Semaglutide, 1 MG/DOSE, (OZEMPIC, 1 MG/DOSE,) 4 MG/3ML SOPN, Inject 1 mg into the skin once a week., Disp: 9 mL, Rfl: 0   simvastatin (ZOCOR) 20 MG tablet, TAKE 1 TABLET(20 MG) BY MOUTH AT BEDTIME, Disp: 90 tablet, Rfl: 3  Observations/Objective: Patient is well-developed, well-nourished in no acute distress.  Resting comfortably at home.  Head is normocephalic, atraumatic.  No labored breathing.  Speech is clear and coherent with logical content.  Patient is alert and oriented at baseline.    Assessment and Plan: 1.  Bacterial sinusitis - amoxicillin-clavulanate (AUGMENTIN) 875-125 MG tablet; Take 1 tablet by mouth 2 (two) times daily.  Dispense: 14 tablet; Refill: 0 - promethazine-dextromethorphan (PROMETHAZINE-DM) 6.25-15 MG/5ML syrup; Take 5 mLs by mouth 4 (four) times daily as needed for cough.  Dispense: 118 mL; Refill: 0 - oxymetazoline (AFRIN NASAL SPRAY) 0.05 % nasal spray; Place 1 spray into both nostrils 2 (two) times daily.  Dispense: 30 mL; Refill: 0  - Worsening symptoms that have not responded to OTC medications.  - Will give augmentin - Promethazine DM for cough  - Afrin for congestion before flying over the weekend - Continue allergy medications.  - Stay well hydrated and get plenty of rest.  - Call or seek in person evaluation if no symptom improvement or if symptoms worsen.  Follow Up Instructions: I discussed the assessment and treatment plan with the patient. The patient was provided an opportunity to ask questions and all were answered.  The patient agreed with the plan and demonstrated an understanding of the instructions.  A copy of instructions were sent to the patient via MyChart unless otherwise noted below.   The patient was advised to call back or seek an in-person evaluation if the symptoms worsen or if the condition fails to improve as anticipated.  Time:  I spent 15 minutes with the patient via telehealth technology discussing the above problems/concerns.    Mar Daring, PA-C

## 2020-12-09 NOTE — Telephone Encounter (Signed)
Still no rejection

## 2020-12-18 ENCOUNTER — Encounter (INDEPENDENT_AMBULATORY_CARE_PROVIDER_SITE_OTHER): Payer: Self-pay | Admitting: Family Medicine

## 2020-12-18 ENCOUNTER — Ambulatory Visit (INDEPENDENT_AMBULATORY_CARE_PROVIDER_SITE_OTHER): Payer: Medicare Other | Admitting: Family Medicine

## 2020-12-18 ENCOUNTER — Other Ambulatory Visit: Payer: Self-pay

## 2020-12-18 VITALS — BP 138/78 | HR 93 | Temp 98.4°F | Ht 64.0 in | Wt 231.0 lb

## 2020-12-18 DIAGNOSIS — I639 Cerebral infarction, unspecified: Secondary | ICD-10-CM

## 2020-12-18 DIAGNOSIS — R7301 Impaired fasting glucose: Secondary | ICD-10-CM | POA: Diagnosis not present

## 2020-12-18 DIAGNOSIS — E8881 Metabolic syndrome: Secondary | ICD-10-CM | POA: Diagnosis not present

## 2020-12-18 DIAGNOSIS — Z6841 Body Mass Index (BMI) 40.0 and over, adult: Secondary | ICD-10-CM

## 2020-12-18 DIAGNOSIS — R058 Other specified cough: Secondary | ICD-10-CM

## 2020-12-19 MED ORDER — HYDROCOD POLST-CPM POLST ER 10-8 MG/5ML PO SUER: 5.0000 mL | Freq: Two times a day (BID) | ORAL | 0 refills | Status: DC | PRN

## 2020-12-19 MED ORDER — AZITHROMYCIN 250 MG PO TABS: ORAL_TABLET | ORAL | 0 refills | Status: AC

## 2020-12-19 NOTE — Telephone Encounter (Signed)
Pt last seen by Dr. Wallace.  

## 2020-12-20 NOTE — Progress Notes (Signed)
Chief Complaint:   OBESITY Lauren Briggs is here to discuss her progress with her obesity treatment plan along with follow-up of her obesity related diagnoses. See Medical Weight Management Flowsheet for complete bioelectrical impedance results.  Today's visit was #: 24 Starting weight: 234 lbs Starting date: 03/01/2020 Weight change since last visit: 0 Total lbs lost to date: 3 lbs Total weight loss percentage to date: -1.28%  Nutrition Plan: Practicing portion control and making smarter food choices, such as increasing vegetables and decreasing simple carbohydrates. Activity: None. Anti-obesity medications: Ozempic 1 mg subcutaneously weekly. Reported side effects: None.  Interim History: [name] had her second shot on Friday.  She says she enjoyed her trip to [place].  She reports having a (trip and) fall with pain and bruising to left upper posterior thigh.  Assessment/Plan:   1. Cough present for greater than 3 weeks Dannelle will start a Z-pak and Tussionex 5 mL every 12 hours as needed for cough.  - Start azithromycin (ZITHROMAX) 250 MG tablet; Take 2 tablets on day 1, then 1 tablet daily on days 2 through 5  Dispense: 6 tablet; Refill: 0 - Start chlorpheniramine-HYDROcodone (TUSSIONEX PENNKINETIC ER) 10-8 MG/5ML SUER; Take 5 mLs by mouth every 12 (twelve) hours as needed for cough.  Dispense: 115 mL; Refill: 0  2. Metabolic syndrome Starting goal: Lose 7-10% of starting weight. She will continue to focus on protein-rich, low simple carbohydrate foods. We reviewed the importance of hydration, regular exercise for stress reduction, and restorative sleep.  We will continue to check lab work every 3 months, with 10% weight loss, or should any other concerns arise.  3. Impaired fasting glucose, with polyphagia Controlled. Current treatment: Ozempic 1 mg subcutaneously weekly.    Plan: She will continue to focus on protein-rich, low simple carbohydrate foods. We reviewed the importance of  hydration, regular exercise for stress reduction, and restorative sleep.  4. Obesity, with current BMI of 39.8  Course: [name] is currently in the action stage of change. As such, her goal is to continue with weight loss efforts.   Nutrition goals: She has agreed to practicing portion control and making smarter food choices, such as increasing vegetables and decreasing simple carbohydrates.   Exercise goals: For substantial health benefits, adults should do at least 150 minutes (2 hours and 30 minutes) a week of moderate-intensity, or 75 minutes (1 hour and 15 minutes) a week of vigorous-intensity aerobic physical activity, or an equivalent combination of moderate- and vigorous-intensity aerobic activity. Aerobic activity should be performed in episodes of at least 10 minutes, and preferably, it should be spread throughout the week.  Behavioral modification strategies: increasing lean protein intake, decreasing simple carbohydrates, increasing vegetables, increasing water intake, and decreasing liquid calories.  [name] has agreed to follow-up with our clinic in 4 weeks. She was informed of the importance of frequent follow-up visits to maximize her success with intensive lifestyle modifications for her multiple health conditions.   Objective:   Blood pressure 138/78, pulse 93, temperature 98.4 F (36.9 C), height 5\' 4"  (1.626 m), weight 231 lb (104.8 kg), SpO2 95 %. Body mass index is 39.65 kg/m.  General: Cooperative, alert, well developed, in no acute distress. HEENT: Conjunctivae and lids unremarkable. Cardiovascular: Regular rhythm.  Lungs: Normal work of breathing. Neurologic: No focal deficits.   Lab Results  Component Value Date   CREATININE 0.73 12/20/2019   BUN 25 (H) 12/20/2019   NA 138 12/20/2019   K 4.1 12/20/2019   CL 103  12/20/2019   CO2 26 12/20/2019   Lab Results  Component Value Date   ALT 22 03/02/2019   AST 24 03/02/2019   ALKPHOS 60 03/02/2019   BILITOT 0.3  03/02/2019   Lab Results  Component Value Date   HGBA1C 6.1 (H) 06/14/2019   HGBA1C 6.3 (H) 03/02/2019   HGBA1C 6.3 (H) 11/09/2018   Lab Results  Component Value Date   INSULIN 8.6 03/02/2019   Lab Results  Component Value Date   TSH 1.420 03/02/2019   Lab Results  Component Value Date   CHOL 183 09/01/2020   HDL 50 09/01/2020   LDLCALC 99 09/01/2020   TRIG 201 (H) 09/01/2020   CHOLHDL 3.7 09/01/2020   Lab Results  Component Value Date   VD25OH 42.5 03/02/2019   Lab Results  Component Value Date   WBC 9.0 12/20/2019   HGB 13.2 12/20/2019   HCT 41.5 12/20/2019   MCV 95.8 12/20/2019   PLT 356 12/20/2019   Lab Results  Component Value Date   IRON 91 03/02/2019   TIBC 480 (H) 03/02/2019   FERRITIN 101 03/02/2019   Attestation Statements:   Reviewed by clinician on day of visit: allergies, medications, problem list, medical history, surgical history, family history, social history, and previous encounter notes.  I, Water quality scientist, CMA, am acting as transcriptionist for Briscoe Deutscher, DO  I have reviewed the above documentation for accuracy and completeness, and I agree with the above. -  Briscoe Deutscher, DO, MS, FAAFP, DABOM - Family and Bariatric Medicine.

## 2020-12-25 ENCOUNTER — Encounter (INDEPENDENT_AMBULATORY_CARE_PROVIDER_SITE_OTHER): Payer: Self-pay | Admitting: Family Medicine

## 2020-12-25 DIAGNOSIS — R058 Other specified cough: Secondary | ICD-10-CM

## 2020-12-25 MED ORDER — AZITHROMYCIN 250 MG PO TABS: ORAL_TABLET | ORAL | 0 refills | Status: AC

## 2020-12-25 MED ORDER — HYDROCOD POLST-CPM POLST ER 10-8 MG/5ML PO SUER: 5.0000 mL | Freq: Two times a day (BID) | ORAL | 0 refills | Status: DC | PRN

## 2020-12-25 NOTE — Telephone Encounter (Signed)
Pt last seen by Dr. Wallace.  

## 2021-01-03 ENCOUNTER — Ambulatory Visit (INDEPENDENT_AMBULATORY_CARE_PROVIDER_SITE_OTHER): Payer: Medicare Other | Admitting: Neurology

## 2021-01-03 ENCOUNTER — Ambulatory Visit (INDEPENDENT_AMBULATORY_CARE_PROVIDER_SITE_OTHER): Payer: Medicare Other | Admitting: Podiatry

## 2021-01-03 ENCOUNTER — Other Ambulatory Visit: Payer: Self-pay

## 2021-01-03 ENCOUNTER — Encounter: Payer: Self-pay | Admitting: Neurology

## 2021-01-03 VITALS — BP 134/74 | HR 80 | Ht 64.0 in | Wt 231.8 lb

## 2021-01-03 DIAGNOSIS — M778 Other enthesopathies, not elsewhere classified: Secondary | ICD-10-CM

## 2021-01-03 DIAGNOSIS — I63532 Cerebral infarction due to unspecified occlusion or stenosis of left posterior cerebral artery: Secondary | ICD-10-CM

## 2021-01-03 DIAGNOSIS — I63332 Cerebral infarction due to thrombosis of left posterior cerebral artery: Secondary | ICD-10-CM

## 2021-01-03 DIAGNOSIS — G3184 Mild cognitive impairment, so stated: Secondary | ICD-10-CM

## 2021-01-03 DIAGNOSIS — M722 Plantar fascial fibromatosis: Secondary | ICD-10-CM

## 2021-01-03 DIAGNOSIS — I672 Cerebral atherosclerosis: Secondary | ICD-10-CM

## 2021-01-03 DIAGNOSIS — Z8639 Personal history of other endocrine, nutritional and metabolic disease: Secondary | ICD-10-CM

## 2021-01-03 DIAGNOSIS — R413 Other amnesia: Secondary | ICD-10-CM | POA: Diagnosis not present

## 2021-01-03 MED ORDER — BETAMETHASONE SOD PHOS & ACET 6 (3-3) MG/ML IJ SUSP
3.0000 mg | Freq: Once | INTRAMUSCULAR | Status: AC
  Administered 2021-01-03: 14:00:00 3 mg via INTRA_ARTICULAR

## 2021-01-03 MED ORDER — METHYLPREDNISOLONE 4 MG PO TBPK: ORAL_TABLET | ORAL | 0 refills | Status: DC

## 2021-01-03 MED ORDER — MELOXICAM 15 MG PO TABS: 15.0000 mg | ORAL_TABLET | Freq: Every day | ORAL | 1 refills | Status: DC

## 2021-01-03 NOTE — Progress Notes (Signed)
[facility] [address] [place]. [place] [address] [phone]       OFFICE CONSULT NOTE  Ms. [name] Date of Birth:  Dec 04, 1955 Medical Record Number:  [id number]   Referring MD: Kary Kos  Reason for Referral: Stroke  HPI: Ms. Opfer is a 65 year old pleasant Caucasian lady seen today for initial office consultation visit.  History is obtained from the patient and her husband is accompanying her and review of referral notes.  I personally reviewed available imaging films in PACS.  Patient states she is started having some memory difficulties after her sixth back surgery in December 22, 2019.  She felt disoriented confused and trouble remembering information for surgery.  She also had some trouble with the right-sided vision.  Over the next several weeks symptoms gradually improved she still does not feel her memory is back to baseline.  She still has to remember information and think.  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  MR angiogram of the brain on 09/28/2020 had shown left PCA occlusion as well as moderate atherosclerotic changes in various intracranial vessels.  Lab work on 09/01/2020 had shown LDL cholesterol 1 1 2  mg percent triglycerides of 201 mg percent.  Patient has been started on aspirin 325 mg daily since then and is also on Zetia 10 mg and Zocor 20 mg.  She still has some occasional naming and word finding difficulties but these seem to be improving and they are not progressive.  She has no prior history of strokes, TIAs or significant neurological issues except chronic back pain for which she is undergone multiple back surgeries.  She denies history of depression, significant prior head injury with loss of consciousness.  No family history of memory loss or dementia.  On Mini-Mental status exam testing today she scored 28/30 regarding  depression scale she is not depressed.  Clock drawing 4/4.  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   Neuromuscular disorder ([place])    Pre-diabetes    Prediabetes 11/10/2018   Shortness of breath dyspnea     Social History:  Social History   Socioeconomic History   Marital status: Married    Spouse name: keith   Number of children: Not on file   Years of education: Not on file   Highest education level: Not on file  Occupational History   Occupation: retired Radio broadcast assistant  Tobacco Use   Smoking status: Former    Years: 3.00    Types: Cigarettes    Quit date: 1981    Years since quitting: 41.9   Smokeless tobacco: Former    Quit date: 01/22/1979  Vaping Use   Vaping Use: Never used  Substance and Sexual Activity   Alcohol use: Yes    Comment: occasional bourbon and ginger ale    Drug use: No   Sexual activity: Not on file  Other Topics Concern  Not on file  Social History Narrative   Not on file   Social Determinants of Health   Financial Resource Strain: Not on file  Food Insecurity: Not on file  Transportation Needs: Not on file  Physical Activity: Not on file  Stress: Not on file  Social Connections: Not on file  Intimate Partner Violence: Not on file    Medications:   Current Outpatient Medications on File Prior to Visit  Medication Sig Dispense Refill   acetaminophen (TYLENOL) 500 MG tablet Take 1,000 mg by mouth every 8 (eight) hours as needed for moderate pain or headache.     Coenzyme Q10 (COQ-10) 100 MG CAPS Take 100 mg by mouth in the  morning and at bedtime. otc 30 capsule 0   estradiol (ESTRACE) 1 MG tablet Take 1 mg by mouth every morning.      ezetimibe (ZETIA) 10 MG tablet TAKE 1 TABLET(10 MG) BY MOUTH DAILY 90 tablet 3   fenofibrate micronized (LOFIBRA) 134 MG capsule TAKE 1 CAPSULE(134 MG) BY MOUTH DAILY BEFORE BREAKFAST 90 capsule 3   fexofenadine (ALLEGRA) 180 MG tablet Take 180 mg by mouth daily.     gabapentin (NEURONTIN) 300 MG capsule Take 1 capsule (300 mg total) by mouth 3 (three) times daily. 90 capsule 1   lisinopril-hydrochlorothiazide (ZESTORETIC) 20-12.5 MG tablet TAKE 1 TABLET BY MOUTH DAILY 90 tablet 1   Multiple Vitamin (MULTIVITAMIN WITH MINERALS) TABS Take 1 tablet by mouth daily after supper. Women's 50 plus     oxymetazoline (AFRIN NASAL SPRAY) 0.05 % nasal spray Place 1 spray into both nostrils 2 (two) times daily. 30 mL 0   Semaglutide, 1 MG/DOSE, (OZEMPIC, 1 MG/DOSE,) 4 MG/3ML SOPN Inject 1 mg into the skin once a week. 9 mL 0   simvastatin (ZOCOR) 20 MG tablet TAKE 1 TABLET(20 MG) BY MOUTH AT BEDTIME 90 tablet 3   No current facility-administered medications on file prior to visit.    Allergies:   Allergies  Allergen Reactions   Versed [Midazolam] Other (See Comments)    Stroke   Iodine Nausea And Vomiting    During a myelogram    Propoxyphene Hcl Palpitations    darvon     Physical Exam General: well developed, well nourished, seated, in no evident distress Head: head normocephalic and atraumatic.   Neck: supple with no carotid or supraclavicular bruits Cardiovascular: regular rate and rhythm, no murmurs Musculoskeletal: no deformity Skin:  no rash/petichiae Vascular:  Normal pulses all extremities  Neurologic Exam Mental Status: Awake and fully alert. Oriented to place and time. Recent and remote memory intact. Attention span, concentration and fund of knowledge appropriate. Mood and affect appropriate.  Mini-Mental status exam score 28/30 with deficits in recall.  Geriatric  depression scale score 2 not depressed.  Clock drawing 4/4.  Able to name 10 animals which can walk on 4 legs. Cranial Nerves: Fundoscopic exam reveals sharp disc margins. Pupils equal, briskly reactive to light. Extraocular movements full without nystagmus. Visual fields full to confrontation. Hearing intact. Facial sensation intact. Face, tongue, palate moves normally and symmetrically.  Motor: Normal bulk and tone. Normal strength in all tested extremity muscles. Sensory.: intact to touch , pinprick , position and vibratory sensation.  Coordination: Rapid alternating movements normal in all extremities. Finger-to-nose and heel-to-shin performed accurately bilaterally. Gait and Station: Arises from chair without difficulty. Stance is normal. Gait demonstrates normal stride length and balance . Able to heel, toe and tandem walk with great difficulty.  Reflexes: 1+  and symmetric. Toes downgoing.   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  Abnormal MRI also showing remote age right basal ganglia infarct and MRA showing diffuse intracranial sclerotic changes.  Vascular risk factors of hyperlipidemia, hypertension, obesity and intracranial atherosclerosis.  She also has poststroke mild cognitive impairment.     PLAN:I had a long d/w patient and her husband about her remote stroke, intracranial atherosclerosis,risk for recurrent stroke/TIAs, personally independently reviewed imaging studies and stroke evaluation results and answered questions.Continue aspirin but drop the dose to aspirin 81 mg daily  for secondary stroke prevention and maintain strict control of hypertension with blood pressure goal below 130/90, diabetes with hemoglobin A1c goal below 6.5% and lipids with LDL cholesterol goal below 70 mg/dL. I also advised  the patient to eat a healthy diet with plenty of whole grains, cereals, fruits and vegetables, exercise regularly and maintain ideal body weight check carotid ultrasound, 2D echo, lipid profile and hemoglobin A1c.  She was already advised to increase participation in cognitively challenging activities like doing regular mentally challenging activities, playing sudoku and solving crossword puzzles.  We also discussed memory compensation strategies.  Check memory panel labs and EEG as well.  Followup in the future with me in 3 months or call earlier if necessary.  Greater than 50% time during this 45-minute consultation visit was spent in counseling and coordination of care about her remote stroke and memory loss and answering questions. Antony Contras, MD Note: This document was prepared with digital dictation and possible smart phrase technology. Any transcriptional errors that result from this process are unintentional.

## 2021-01-03 NOTE — Progress Notes (Signed)
Subjective: 65 y.o. female presenting today for an acute flareup and new complaint regarding pain and tenderness to the right plantar heel.  Patient believes she possibly has plantar fasciitis.  This began a few months ago.  She has not done anything for treatment currently.  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   Neuromuscular disorder ([place])    Pre-diabetes    Prediabetes 11/10/2018   Shortness of breath dyspnea    Past Surgical History:  Procedure Laterality Date   ABDOMINAL HYSTERECTOMY     PARTIAL   ANTERIOR LAT LUMBAR FUSION N/A 08/14/2018   Procedure: extreme lateral interbody fusion - Lumbar two-Lumbar three  cortical screws and removal pedicle screws Lumbar -Sacral one;  Surgeon: Kary Kos, MD;  Location: [facility];  Service: Neurosurgery;  Laterality: N/A;   APPENDECTOMY     BACK SURGERY     times 4   CARPAL TUNNEL RELEASE     BIL    CESAREAN SECTION     PARTIAL HYST   CLOSED REDUCTION ULNAR SHAFT     COLONOSCOPY     FOOT SURGERY     LT FOOT CYST REMOVED    HARDWARE REMOVAL N/A 08/14/2018   Procedure: Removal pedicle screws Lumbar -Sacral one with cortical screw fixation lumbar two-three;  Surgeon: Kary Kos, MD;  Location: [facility];  Service: Neurosurgery;  Laterality: N/A;   NASAL SINUS SURGERY     1994    radial head Right 01/14/2016   at [facility] Right 01/26/2016   Procedure: RIGHT RADIAL HEAD REPLACEMENT ARTHROPLASTY WITH LIGAMENT REPAIRS;  Surgeon: Charlotte Crumb, MD;  Location: [facility];  Service: Orthopedics;  Laterality: Right;  Right elbow raial head replacement with possible ligament repairs as needed    UPPER  GASTROINTESTINAL ENDOSCOPY       Objective: Physical Exam General: The patient is alert and oriented x3 in no acute distress.  Dermatology: Skin is warm, dry and supple bilateral lower extremities. Negative for open lesions or macerations bilateral.   Vascular: Dorsalis Pedis and Posterior Tibial pulses palpable bilateral.  Capillary fill time is immediate to all digits.  Neurological: Epicritic and protective threshold intact bilateral.   Musculoskeletal: Tenderness to palpation to the plantar aspect of the right heel along the plantar fascia. All other joints range of motion within normal limits bilateral. Strength 5/5 in all groups bilateral.   Assessment: 1. Plantar fasciitis right 2. H/o dorsal exostectomy bilateral feet  Plan of Care:  1. Patient evaluated. Xrays reviewed.   2. Injection of 0.5cc Celestone soluspan injected into the right plantar fascia  3. Rx for Medrol Dose Pack placed 4. Rx for Meloxicam ordered for patient. 5. Plantar fascial band(s) dispensed to support the medial longitudinal arch of the foot and alleviate pressure from the heel 6. Instructed patient regarding therapies and modalities at home to alleviate symptoms.  7. Return to clinic in 4 weeks.   *Recently went on vacation to [place], DPM [facility]  Dr. Edrick Kins, DPM    [address]  [place], [place] [address]                Office [phone]  Fax [phone]

## 2021-01-03 NOTE — Patient Instructions (Signed)
I had a long d/w patient and her husband about her remote stroke, intracranial atherosclerosis,risk for recurrent stroke/TIAs, personally independently reviewed imaging studies and stroke evaluation results and answered questions.Continue aspirin but drop the dose to aspirin 81 mg daily  for secondary stroke prevention and maintain strict control of hypertension with blood pressure goal below 130/90, diabetes with hemoglobin A1c goal below 6.5% and lipids with LDL cholesterol goal below 70 mg/dL. I also advised the patient to eat a healthy diet with plenty of whole grains, cereals, fruits and vegetables, exercise regularly and maintain ideal body weight check carotid ultrasound, 2D echo, lipid profile and hemoglobin A1c.  She was already advised to increase participation in cognitively challenging activities like doing regular mentally challenging activities, playing sudoku and solving crossword puzzles.  We also discussed memory compensation strategies.  Check memory panel labs and EEG as well.  Followup in the future with me in 3 months or call earlier if necessary. Memory Compensation Strategies  Use "WARM" strategy.  W= write it down  A= associate it  R= repeat it  M= make a mental note  2.   You can keep a Social worker.  Use a 3-ring notebook with sections for the following: calendar, important names and phone numbers,  medications, doctors' names/phone numbers, lists/reminders, and a section to journal what you did  each day.   3.    Use a calendar to write appointments down.  4.    Write yourself a schedule for the day.  This can be placed on the calendar or in a separate section of the Memory Notebook.  Keeping a  regular schedule can help memory.  5.    Use medication organizer with sections for each day or morning/evening pills.  You may need help loading it  6.    Keep a basket, or pegboard by the door.  Place items that you need to take out with you in the basket or on the  pegboard.  You may also want to  include a message board for reminders.  7.    Use sticky notes.  Place sticky notes with reminders in a place where the task is performed.  For example: " turn off the  stove" placed by the stove, "lock the door" placed on the door at eye level, " take your medications" on  the bathroom mirror or by the place where you normally take your medications.  8.    Use alarms/timers.  Use while cooking to remind yourself to check on food or as a reminder to take your medicine, or as a  reminder to make a call, or as a reminder to perform another task, etc.  Stroke Prevention Some medical conditions and behaviors can lead to a higher chance of having a stroke. You can help prevent a stroke by eating healthy, exercising, not smoking, and managing any medical conditions you have. Stroke is a leading cause of functional impairment. Primary prevention is particularly important because a majority of strokes are first-time events. Stroke changes the lives of not only those who experience a stroke but also their family and other caregivers. How can this condition affect me? A stroke is a medical emergency and should be treated right away. A stroke can lead to brain damage and can sometimes be life-threatening. If a person gets medical treatment right away, there is a better chance of surviving and recovering from a stroke. What can increase my risk? The following medical conditions may increase your risk  of a stroke: Cardiovascular disease. High blood pressure (hypertension). Diabetes. High cholesterol. Sickle cell disease. Blood clotting disorders (hypercoagulable state). Obesity. Sleep disorders (obstructive sleep apnea). Other risk factors include: Being older than age 35. Having a history of blood clots, stroke, or mini-stroke (transient ischemic attack, TIA). Genetic factors, such as race, ethnicity, or a family history of stroke. Smoking cigarettes or using other  tobacco products. Taking birth control pills, especially if you also use tobacco. Heavy use of alcohol or drugs, especially cocaine and methamphetamine. Physical inactivity. What actions can I take to prevent this? Manage your health conditions High cholesterol levels. Eating a healthy diet is important for preventing high cholesterol. If cholesterol cannot be managed through diet alone, you may need to take medicines. Take any prescribed medicines to control your cholesterol as told by your health care provider. Hypertension. To reduce your risk of stroke, try to keep your blood pressure below 130/80. Eating a healthy diet and exercising regularly are important for controlling blood pressure. If these steps are not enough to manage your blood pressure, you may need to take medicines. Take any prescribed medicines to control hypertension as told by your health care provider. Ask your health care provider if you should monitor your blood pressure at home. Have your blood pressure checked every year, even if your blood pressure is normal. Blood pressure increases with age and some medical conditions. Diabetes. Eating a healthy diet and exercising regularly are important parts of managing your blood sugar (glucose). If your blood sugar cannot be managed through diet and exercise, you may need to take medicines. Take any prescribed medicines to control your diabetes as told by your health care provider. Get evaluated for obstructive sleep apnea. Talk to your health care provider about getting a sleep evaluation if you snore a lot or have excessive sleepiness. Make sure that any other medical conditions you have, such as atrial fibrillation or atherosclerosis, are managed. Nutrition Follow instructions from your health care provider about what to eat or drink to help manage your health condition. These instructions may include: Reducing your daily calorie intake. Limiting how much salt (sodium) you  use to 1,500 milligrams (mg) each day. Using only healthy fats for cooking, such as olive oil, canola oil, or sunflower oil. Eating healthy foods. You can do this by: Choosing foods that are high in fiber, such as whole grains, and fresh fruits and vegetables. Eating at least 5 servings of fruits and vegetables a day. Try to fill one-half of your plate with fruits and vegetables at each meal. Choosing lean protein foods, such as lean cuts of meat, poultry without skin, fish, tofu, beans, and nuts. Eating low-fat dairy products. Avoiding foods that are high in sodium. This can help lower blood pressure. Avoiding foods that have saturated fat, trans fat, and cholesterol. This can help prevent high cholesterol. Avoiding processed and prepared foods. Counting your daily carbohydrate intake.  Lifestyle If you drink alcohol: Limit how much you have to: 0-1 drink a day for women who are not pregnant. 0-2 drinks a day for men. Know how much alcohol is in your drink. In the [place] one drink equals one 12 oz bottle of beer (371mL), one 5 oz glass of wine (167mL), or one 1 oz glass of hard liquor (18mL). Do not use any products that contain nicotine or tobacco. These products include cigarettes, chewing tobacco, and vaping devices, such as e-cigarettes. If you need help quitting, ask your health care provider. Avoid secondhand smoke.  Do not use drugs. Activity  Try to stay at a healthy weight. Get at least 30 minutes of exercise on most days, such as: Fast walking. Biking. Swimming. Medicines Take over-the-counter and prescription medicines only as told by your health care provider. Aspirin or blood thinners (antiplatelets or anticoagulants) may be recommended to reduce your risk of forming blood clots that can lead to stroke. Avoid taking birth control pills. Talk to your health care provider about the risks of taking birth control pills if: You are over 74 years old. You smoke. You get very  bad headaches. You have had a blood clot. Where to find more information American Stroke Association: www.strokeassociation.org Get help right away if: You or a loved one has any symptoms of a stroke. "BE FAST" is an easy way to remember the main warning signs of a stroke: B - Balance. Signs are dizziness, sudden trouble walking, or loss of balance. E - Eyes. Signs are trouble seeing or a sudden change in vision. F - Face. Signs are sudden weakness or numbness of the face, or the face or eyelid drooping on one side. A - Arms. Signs are weakness or numbness in an arm. This happens suddenly and usually on one side of the body. S - Speech. Signs are sudden trouble speaking, slurred speech, or trouble understanding what people say. T - Time. Time to call emergency services. Write down what time symptoms started. You or a loved one has other signs of a stroke, such as: A sudden, severe headache with no known cause. Nausea or vomiting. Seizure. These symptoms may represent a serious problem that is an emergency. Do not wait to see if the symptoms will go away. Get medical help right away. Call your local emergency services (911 in the [place] Do not drive yourself to the hospital. Summary You can help to prevent a stroke by eating healthy, exercising, not smoking, limiting alcohol intake, and managing any medical conditions you may have. Do not use any products that contain nicotine or tobacco. These include cigarettes, chewing tobacco, and vaping devices, such as e-cigarettes. If you need help quitting, ask your health care provider. Remember "BE FAST" for warning signs of a stroke. Get help right away if you or a loved one has any of these signs. This information is not intended to replace advice given to you by your health care provider. Make sure you discuss any questions you have with your health care provider. Document Revised: 08/09/2019 Document Reviewed: 08/09/2019 Elsevier Patient Education   [place].

## 2021-01-04 ENCOUNTER — Telehealth: Payer: Self-pay | Admitting: Neurology

## 2021-01-04 ENCOUNTER — Encounter (HOSPITAL_BASED_OUTPATIENT_CLINIC_OR_DEPARTMENT_OTHER): Payer: Medicare Other

## 2021-01-04 LAB — LIPID PANEL
Chol/HDL Ratio: 3.7 ratio (ref 0.0–4.4)
Cholesterol, Total: 197 mg/dL (ref 100–199)
HDL: 53 mg/dL (ref >39)
LDL Chol Calc (NIH): 117 mg/dL — ABNORMAL HIGH (ref 0–99)
Triglycerides: 156 mg/dL — ABNORMAL HIGH (ref 0–149)
VLDL Cholesterol Cal: 27 mg/dL (ref 5–40)

## 2021-01-04 LAB — HEMOGLOBIN A1C
Est. average glucose Bld gHb Est-mCnc: 120 mg/dL
Hgb A1c MFr Bld: 5.8 % — ABNORMAL HIGH (ref 4.8–5.6)

## 2021-01-04 LAB — DEMENTIA PANEL
Homocysteine: 11.6 umol/L (ref 0.0–17.2)
RPR Ser Ql: NONREACTIVE
TSH: 0.964 u[IU]/mL (ref 0.450–4.500)
Vitamin B-12: 744 pg/mL (ref 232–1245)

## 2021-01-04 NOTE — Telephone Encounter (Signed)
Medicare/erie supp no Roxanna Mew, sent a message to Butch Penny she will reach out to the patient to schedule.

## 2021-01-08 ENCOUNTER — Other Ambulatory Visit: Payer: Self-pay | Admitting: *Deleted

## 2021-01-08 ENCOUNTER — Telehealth: Payer: Self-pay | Admitting: *Deleted

## 2021-01-08 MED ORDER — SIMVASTATIN 40 MG PO TABS: 40.0000 mg | ORAL_TABLET | Freq: Every day | ORAL | 1 refills | Status: DC

## 2021-01-08 NOTE — Telephone Encounter (Signed)
I spoke to the patient. She verbalized understanding of the lab results. Agreeable to the higher dose of simvastatin. New rx sent to pharmacy. She will keep her pending appt at our office.

## 2021-01-08 NOTE — Telephone Encounter (Signed)
-----   Message from Garvin Fila, MD sent at 01/07/2021  4:11 PM EST ----- Kindly inform the patient that lab work for reversible causes of memory loss was satisfactory.  Cholesterol profile is not yet satisfactory and bad cholesterol is elevated so I recommend increasing the dose of simvastatin to 40 mg daily. ----- Message ----- From: Interface, [facility] Lab Results In Sent: 01/04/2021   7:37 AM EST To: Garvin Fila, MD

## 2021-01-09 ENCOUNTER — Encounter (INDEPENDENT_AMBULATORY_CARE_PROVIDER_SITE_OTHER): Payer: Self-pay | Admitting: Family Medicine

## 2021-01-09 ENCOUNTER — Other Ambulatory Visit: Payer: Self-pay

## 2021-01-09 ENCOUNTER — Ambulatory Visit (INDEPENDENT_AMBULATORY_CARE_PROVIDER_SITE_OTHER): Payer: Medicare Other | Admitting: Family Medicine

## 2021-01-09 VITALS — BP 156/79 | HR 78 | Temp 97.9°F | Ht 64.0 in | Wt 227.0 lb

## 2021-01-09 DIAGNOSIS — I10 Essential (primary) hypertension: Secondary | ICD-10-CM | POA: Diagnosis not present

## 2021-01-09 DIAGNOSIS — R7301 Impaired fasting glucose: Secondary | ICD-10-CM | POA: Diagnosis not present

## 2021-01-09 DIAGNOSIS — I63332 Cerebral infarction due to thrombosis of left posterior cerebral artery: Secondary | ICD-10-CM | POA: Diagnosis not present

## 2021-01-09 DIAGNOSIS — M5442 Lumbago with sciatica, left side: Secondary | ICD-10-CM | POA: Diagnosis not present

## 2021-01-09 DIAGNOSIS — E782 Mixed hyperlipidemia: Secondary | ICD-10-CM

## 2021-01-09 DIAGNOSIS — G8929 Other chronic pain: Secondary | ICD-10-CM

## 2021-01-09 DIAGNOSIS — M5441 Lumbago with sciatica, right side: Secondary | ICD-10-CM

## 2021-01-09 DIAGNOSIS — Z6841 Body Mass Index (BMI) 40.0 and over, adult: Secondary | ICD-10-CM

## 2021-01-10 MED ORDER — OZEMPIC (1 MG/DOSE) 4 MG/3ML ~~LOC~~ SOPN: 1.0000 mg | PEN_INJECTOR | SUBCUTANEOUS | 0 refills | Status: DC

## 2021-01-10 NOTE — Progress Notes (Signed)
Chief Complaint:   OBESITY Lauren Briggs is here to discuss her progress with her obesity treatment plan along with follow-up of her obesity related diagnoses. See Medical Weight Management Flowsheet for complete bioelectrical impedance results.  Today's visit was #: 25 Starting weight: 234 lbs Starting date: 03/01/2020 Weight change since last visit: 4 lbs Total lbs lost to date: 7 lbs Total weight loss percentage to date: -2.99%  Nutrition Plan: Practicing portion control and making smarter food choices, such as increasing vegetables and decreasing simple carbohydrates for 0% of the time. Activity: None. Anti-obesity medications: Ozempic 1 mg subcutaneously weekly. Reported side effects: None.  Assessment/Plan:   1. Impaired fasting glucose, with polyphagia Controlled. Current treatment: Ozempic 1 mg subcutaneously weekly.    Plan:  Continue Ozempic 1 mg subcutaneously weekly.  Will refill today, as per below. She will continue to focus on protein-rich, low simple carbohydrate foods. We reviewed the importance of hydration, regular exercise for stress reduction, and restorative sleep.  - Refill Semaglutide, 1 MG/DOSE, (OZEMPIC, 1 MG/DOSE,) 4 MG/3ML SOPN; Inject 1 mg into the skin once a week.  Dispense: 9 mL; Refill: 0  2. Mixed hyperlipidemia Course: Not at goal. Lipid-lowering medications: Zocor 40 mg daily, Zetia 10 mg daily, fenofibrate 134 mg daily.   Plan: Dietary changes: Increase soluble fiber, decrease simple carbohydrates, decrease saturated fat. Exercise changes: Moderate to vigorous-intensity aerobic activity 150 minutes per week or as tolerated. We will continue to monitor along with PCP/specialists as it pertains to her weight loss journey.  Lab Results  Component Value Date   CHOL 197 01/03/2021   HDL 53 01/03/2021   LDLCALC 117 (H) 01/03/2021   TRIG 156 (H) 01/03/2021   CHOLHDL 3.7 01/03/2021   Lab Results  Component Value Date   ALT 22 03/02/2019   AST 24  03/02/2019   ALKPHOS 60 03/02/2019   BILITOT 0.3 03/02/2019   3. Essential hypertension Elevated today. Medications: Zestoretic 20-12.5 mg daily.   Plan: Avoid buying foods that are: processed, frozen, or prepackaged to avoid excess salt. We will watch for signs of hypotension as she continues lifestyle modifications.  BP Readings from Last 3 Encounters:  01/09/21 (!) 156/79  01/03/21 134/74  12/18/20 138/78   Lab Results  Component Value Date   CREATININE 0.73 12/20/2019   4. Chronic bilateral low back pain, s/p lumbar fusion Lauren Briggs is followed by [facility].  Will continue to monitor as it relates to her weight loss journey.  5. Obesity, with current BMI of 39  Course: Lauren Briggs is currently in the action stage of change. As such, her goal is to continue with weight loss efforts.   Nutrition goals: She has agreed to practicing portion control and making smarter food choices, such as increasing vegetables and decreasing simple carbohydrates.   Exercise goals:  As tolerated.  Behavioral modification strategies: increasing lean protein intake, decreasing simple carbohydrates, increasing vegetables, increasing water intake, and emotional eating strategies.  Lauren Briggs has agreed to follow-up with our clinic in 4 weeks. She was informed of the importance of frequent follow-up visits to maximize her success with intensive lifestyle modifications for her multiple health conditions.   Objective:   Blood pressure (!) 156/79, pulse 78, temperature 97.9 F (36.6 C), temperature source Oral, height 5\' 4"  (1.626 m), weight 227 lb (103 kg), SpO2 98 %. Body mass index is 38.96 kg/m.  General: Cooperative, alert, well developed, in no acute distress. HEENT: Conjunctivae and lids unremarkable. Cardiovascular: Regular rhythm.  Lungs: Normal work of breathing. Neurologic: No focal deficits.   Lab Results  Component Value Date   CREATININE 0.73 12/20/2019   BUN 25 (H)  12/20/2019   NA 138 12/20/2019   K 4.1 12/20/2019   CL 103 12/20/2019   CO2 26 12/20/2019   Lab Results  Component Value Date   ALT 22 03/02/2019   AST 24 03/02/2019   ALKPHOS 60 03/02/2019   BILITOT 0.3 03/02/2019   Lab Results  Component Value Date   HGBA1C 5.8 (H) 01/03/2021   HGBA1C 6.1 (H) 06/14/2019   HGBA1C 6.3 (H) 03/02/2019   HGBA1C 6.3 (H) 11/09/2018   Lab Results  Component Value Date   INSULIN 8.6 03/02/2019   Lab Results  Component Value Date   TSH 0.964 01/03/2021   Lab Results  Component Value Date   CHOL 197 01/03/2021   HDL 53 01/03/2021   LDLCALC 117 (H) 01/03/2021   TRIG 156 (H) 01/03/2021   CHOLHDL 3.7 01/03/2021   Lab Results  Component Value Date   VD25OH 42.5 03/02/2019   Lab Results  Component Value Date   WBC 9.0 12/20/2019   HGB 13.2 12/20/2019   HCT 41.5 12/20/2019   MCV 95.8 12/20/2019   PLT 356 12/20/2019   Lab Results  Component Value Date   IRON 91 03/02/2019   TIBC 480 (H) 03/02/2019   FERRITIN 101 03/02/2019   Attestation Statements:   Reviewed by clinician on day of visit: allergies, medications, problem list, medical history, surgical history, family history, social history, and previous encounter notes.  I, Water quality scientist, CMA, am acting as transcriptionist for Briscoe Deutscher, DO  I have reviewed the above documentation for accuracy and completeness, and I agree with the above. -  Briscoe Deutscher, DO, MS, FAAFP, DABOM - Family and Bariatric Medicine.

## 2021-01-11 ENCOUNTER — Encounter: Payer: Self-pay | Admitting: *Deleted

## 2021-01-11 NOTE — Telephone Encounter (Signed)
I spoke to the patient. Reports headache with generic Lipitor. Reports extreme leg cramps with 40mg  generic Zocor. She understands the importance of getting her cholesterol under better control, especially with her stroke history.She is going to remain on the generic Zocor 20mg  daily and has an appt scheduled with her PCP to review other options.

## 2021-01-11 NOTE — Telephone Encounter (Signed)
Pt has called to report that the simvastatin (ZOCOR) 40 MG tablet dose is too strong.  Pt has gone back to 20mg .  Pt is asking for a call to discuss.

## 2021-01-23 ENCOUNTER — Telehealth (INDEPENDENT_AMBULATORY_CARE_PROVIDER_SITE_OTHER): Payer: Self-pay

## 2021-01-23 NOTE — Telephone Encounter (Signed)
Pt called in and stated that she is requesting a call back from Lauren Briggs. Please advise

## 2021-01-24 ENCOUNTER — Encounter (INDEPENDENT_AMBULATORY_CARE_PROVIDER_SITE_OTHER): Payer: Self-pay

## 2021-01-24 MED ORDER — ALPRAZOLAM 0.25 MG PO TABS: 0.2500 mg | ORAL_TABLET | Freq: Two times a day (BID) | ORAL | 0 refills | Status: DC | PRN

## 2021-01-24 MED ORDER — PROPRANOLOL HCL 20 MG PO TABS: 20.0000 mg | ORAL_TABLET | Freq: Three times a day (TID) | ORAL | 2 refills | Status: DC | PRN

## 2021-01-25 ENCOUNTER — Ambulatory Visit (INDEPENDENT_AMBULATORY_CARE_PROVIDER_SITE_OTHER): Payer: Medicare Other | Admitting: Neurology

## 2021-01-25 DIAGNOSIS — R413 Other amnesia: Secondary | ICD-10-CM

## 2021-01-25 DIAGNOSIS — R410 Disorientation, unspecified: Secondary | ICD-10-CM

## 2021-01-29 ENCOUNTER — Other Ambulatory Visit (HOSPITAL_BASED_OUTPATIENT_CLINIC_OR_DEPARTMENT_OTHER): Payer: Medicare Other

## 2021-01-30 ENCOUNTER — Other Ambulatory Visit: Payer: Self-pay

## 2021-01-30 ENCOUNTER — Ambulatory Visit (INDEPENDENT_AMBULATORY_CARE_PROVIDER_SITE_OTHER): Payer: Medicare Other

## 2021-01-30 DIAGNOSIS — I63532 Cerebral infarction due to unspecified occlusion or stenosis of left posterior cerebral artery: Secondary | ICD-10-CM

## 2021-01-30 DIAGNOSIS — I63332 Cerebral infarction due to thrombosis of left posterior cerebral artery: Secondary | ICD-10-CM | POA: Diagnosis not present

## 2021-01-30 LAB — ECHOCARDIOGRAM COMPLETE
AR max vel: 2.69 cm2
AV Area VTI: 2.86 cm2
AV Area mean vel: 2.56 cm2
AV Mean grad: 3 mmHg
AV Peak grad: 5.4 mmHg
Ao pk vel: 1.16 m/s
Area-P 1/2: 3.68 cm2
Calc EF: 68.1 %
S' Lateral: 3.1 cm
Single Plane A2C EF: 71.9 %
Single Plane A4C EF: 65.9 %

## 2021-01-30 NOTE — Progress Notes (Signed)
Your brainwave study was normal and did not show any evidence of seizure activity

## 2021-01-30 NOTE — Progress Notes (Signed)
Kindly inform the patient that carotid ultrasound study showed no evidence of significant narrowing of either carotid arteries in the neck

## 2021-02-05 ENCOUNTER — Encounter: Payer: Self-pay | Admitting: *Deleted

## 2021-02-05 NOTE — Progress Notes (Signed)
Please review

## 2021-02-12 ENCOUNTER — Other Ambulatory Visit: Payer: Self-pay

## 2021-02-12 ENCOUNTER — Encounter (INDEPENDENT_AMBULATORY_CARE_PROVIDER_SITE_OTHER): Payer: Self-pay | Admitting: Family Medicine

## 2021-02-12 ENCOUNTER — Ambulatory Visit (INDEPENDENT_AMBULATORY_CARE_PROVIDER_SITE_OTHER): Payer: Medicare Other | Admitting: Family Medicine

## 2021-02-12 VITALS — BP 159/86 | HR 74 | Temp 98.0°F | Ht 64.0 in | Wt 231.0 lb

## 2021-02-12 DIAGNOSIS — E669 Obesity, unspecified: Secondary | ICD-10-CM

## 2021-02-12 DIAGNOSIS — I10 Essential (primary) hypertension: Secondary | ICD-10-CM

## 2021-02-12 DIAGNOSIS — R7301 Impaired fasting glucose: Secondary | ICD-10-CM | POA: Diagnosis not present

## 2021-02-12 DIAGNOSIS — F418 Other specified anxiety disorders: Secondary | ICD-10-CM

## 2021-02-12 DIAGNOSIS — Z6839 Body mass index (BMI) 39.0-39.9, adult: Secondary | ICD-10-CM

## 2021-02-14 NOTE — Progress Notes (Signed)
Chief Complaint:   OBESITY Lauren Briggs is here to discuss her progress with her obesity treatment plan along with follow-up of her obesity related diagnoses. See Medical Weight Management Flowsheet for complete bioelectrical impedance results.  Today's visit was #: 35 Starting weight: 234 lbs Starting date: 03/01/2020 Weight change since last visit: +4 lbs Total lbs lost to date: 3 lbs Total weight loss percentage to date: -1.28%  Nutrition Plan: Practicing portion control and making smarter food choices, such as increasing vegetables and decreasing simple carbohydrates for 0% of the time. Activity: None. Anti-obesity medications: Ozempic 1 mg subcutaneously weekly. Reported side effects: None.  Assessment/Plan:   1. Impaired fasting glucose, with polyphagia Controlled. Current treatment: Ozempic 1 mg subcutaneously weekly.    Plan:  Continue Ozempic 1 mg subcutaneously weekly. She will continue to focus on protein-rich, low simple carbohydrate foods. We reviewed the importance of hydration, regular exercise for stress reduction, and restorative sleep.  2. Essential hypertension Elevated today. Medications: Zestoretic 20-12.5 mg daily, propranolol 20 mg three times daily as needed (anxiety).   Plan: Avoid buying foods that are: processed, frozen, or prepackaged to avoid excess salt. We will watch for signs of hypotension as she continues lifestyle modifications.  BP Readings from Last 3 Encounters:  02/12/21 (!) 159/86  01/09/21 (!) 156/79  01/03/21 134/74   Lab Results  Component Value Date   CREATININE 0.73 12/20/2019   3. Situational anxiety Lauren Briggs is taking propranolol 20 mg three times daily as needed for anxiety.  She is also receiving counseling at [facility].  4. Obesity, with current BMI of 39.7  Course: Lauren Briggs is currently in the action stage of change. As such, her goal is to continue with weight loss efforts.   Nutrition goals: She has agreed to practicing  portion control and making smarter food choices, such as increasing vegetables and decreasing simple carbohydrates.   Exercise goals:  Increased NEAT.  Behavioral modification strategies: increasing lean protein intake, decreasing simple carbohydrates, increasing vegetables, increasing water intake, and decreasing liquid calories.  Lauren Briggs has agreed to follow-up with our clinic in 2 weeks. She was informed of the importance of frequent follow-up visits to maximize her success with intensive lifestyle modifications for her multiple health conditions.   Objective:   Blood pressure (!) 159/86, pulse 74, temperature 98 F (36.7 C), temperature source Oral, height 5\' 4"  (1.626 m), weight 231 lb (104.8 kg), SpO2 99 %. Body mass index is 39.65 kg/m.  General: Cooperative, alert, well developed, in no acute distress. HEENT: Conjunctivae and lids unremarkable. Cardiovascular: Regular rhythm.  Lungs: Normal work of breathing. Neurologic: No focal deficits.   Lab Results  Component Value Date   CREATININE 0.73 12/20/2019   BUN 25 (H) 12/20/2019   NA 138 12/20/2019   K 4.1 12/20/2019   CL 103 12/20/2019   CO2 26 12/20/2019   Lab Results  Component Value Date   ALT 22 03/02/2019   AST 24 03/02/2019   ALKPHOS 60 03/02/2019   BILITOT 0.3 03/02/2019   Lab Results  Component Value Date   HGBA1C 5.8 (H) 01/03/2021   HGBA1C 6.1 (H) 06/14/2019   HGBA1C 6.3 (H) 03/02/2019   HGBA1C 6.3 (H) 11/09/2018   Lab Results  Component Value Date   INSULIN 8.6 03/02/2019   Lab Results  Component Value Date   TSH 0.964 01/03/2021   Lab Results  Component Value Date   CHOL 197 01/03/2021   HDL 53 01/03/2021   LDLCALC 117 (H)  01/03/2021   TRIG 156 (H) 01/03/2021   CHOLHDL 3.7 01/03/2021   Lab Results  Component Value Date   VD25OH 42.5 03/02/2019   Lab Results  Component Value Date   WBC 9.0 12/20/2019   HGB 13.2 12/20/2019   HCT 41.5 12/20/2019   MCV 95.8 12/20/2019   PLT 356  12/20/2019   Lab Results  Component Value Date   IRON 91 03/02/2019   TIBC 480 (H) 03/02/2019   FERRITIN 101 03/02/2019   Attestation Statements:   Reviewed by clinician on day of visit: allergies, medications, problem list, medical history, surgical history, family history, social history, and previous encounter notes.  Time spent on visit including pre-visit chart review and post-visit care and documentation was 42 minutes. Time was spent on: I performed a medically necessary appropriate examination and/or evaluation. I discussed the assessment and treatment plan with the patient. Motivational interviewing as well as evidence-based interventions for health behavior change were utilized today including the discussion of self monitoring techniques, problem-solving barriers and SMART goal setting techniques.  The patient was provided an opportunity to ask questions and all were answered. The patient agreed with the plan and demonstrated an understanding of the instructions. Clinical information was updated and documented in the EMR.  I, Water quality scientist, CMA, am acting as transcriptionist for Briscoe Deutscher, DO  I have reviewed the above documentation for accuracy and completeness, and I agree with the above. -  Briscoe Deutscher, DO, MS, FAAFP, DABOM - Family and Bariatric Medicine.

## 2021-02-19 ENCOUNTER — Ambulatory Visit (INDEPENDENT_AMBULATORY_CARE_PROVIDER_SITE_OTHER): Payer: Medicare Other | Admitting: Family Medicine

## 2021-02-19 ENCOUNTER — Encounter (INDEPENDENT_AMBULATORY_CARE_PROVIDER_SITE_OTHER): Payer: Self-pay | Admitting: Family Medicine

## 2021-02-19 ENCOUNTER — Other Ambulatory Visit: Payer: Self-pay

## 2021-02-19 VITALS — BP 126/65 | HR 80 | Temp 98.1°F | Ht 64.0 in | Wt 226.0 lb

## 2021-02-19 DIAGNOSIS — E669 Obesity, unspecified: Secondary | ICD-10-CM

## 2021-02-19 DIAGNOSIS — F418 Other specified anxiety disorders: Secondary | ICD-10-CM | POA: Diagnosis not present

## 2021-02-19 DIAGNOSIS — E8881 Metabolic syndrome: Secondary | ICD-10-CM | POA: Diagnosis not present

## 2021-02-19 DIAGNOSIS — R7301 Impaired fasting glucose: Secondary | ICD-10-CM | POA: Diagnosis not present

## 2021-02-19 DIAGNOSIS — Z6838 Body mass index (BMI) 38.0-38.9, adult: Secondary | ICD-10-CM

## 2021-02-19 MED ORDER — ALPRAZOLAM 0.25 MG PO TABS: 0.2500 mg | ORAL_TABLET | Freq: Two times a day (BID) | ORAL | 0 refills | Status: DC | PRN

## 2021-02-19 MED ORDER — PROPRANOLOL HCL 20 MG PO TABS: 20.0000 mg | ORAL_TABLET | Freq: Three times a day (TID) | ORAL | 2 refills | Status: DC | PRN

## 2021-02-20 NOTE — Progress Notes (Signed)
Chief Complaint:   OBESITY Lauren Briggs is here to discuss her progress with her obesity treatment plan along with follow-up of her obesity related diagnoses. See Medical Weight Management Flowsheet for complete bioelectrical impedance results.  Today's visit was #: 48 Starting weight: 234 lbs Starting date: 03/01/2020 Weight change since last visit: 5 lbs Total lbs lost to date: 8 lbs Total weight loss percentage to date: -3.24%  Nutrition Plan: Practicing portion control and making smarter food choices, such as increasing vegetables and decreasing simple carbohydrates for 5-10% of the time. Activity: None. Anti-obesity medications: Ozempic 1 mg subcutaneously weekly. Reported side effects: None.  Assessment/Plan:   1. Impaired fasting glucose, with polyphagia Controlled. Current treatment: Ozempic 1 mg subcutaneously weekly.    Plan: Continue Ozempic 1 mg subcutaneously weekly.  She will continue to focus on protein-rich, low simple carbohydrate foods. We reviewed the importance of hydration, regular exercise for stress reduction, and restorative sleep.  2. Metabolic syndrome Starting goal: Lose 7-10% of starting weight. She will continue to focus on protein-rich, low simple carbohydrate foods. We reviewed the importance of hydration, regular exercise for stress reduction, and restorative sleep.  We will continue to check lab work every 3 months, with 10% weight loss, or should any other concerns arise.  3. Situational anxiety Lauren Briggs is taking propranolol 20 mg three times daily as needed and alprazolam 0.25 mg twice daily as needed for anxiety.  Behavior modification techniques were discussed today to help deal with emotional/non-hunger eating behaviors.  - Refill ALPRAZolam (XANAX) 0.25 MG tablet; Take 1 tablet (0.25 mg total) by mouth 2 (two) times daily as needed for anxiety.  Dispense: 20 tablet; Refill: 0 - Refill propranolol (INDERAL) 20 MG tablet; Take 1 tablet (20 mg total) by  mouth 3 (three) times daily as needed.  Dispense: 90 tablet; Refill: 2  4. Obesity with current BMI of 38.9  Course: Lauren Briggs is currently in the action stage of change. As such, her goal is to continue with weight loss efforts.   Nutrition goals: She has agreed to practicing portion control and making smarter food choices, such as increasing vegetables and decreasing simple carbohydrates.   Exercise goals:  As is.  Behavioral modification strategies: increasing lean protein intake, decreasing simple carbohydrates, increasing vegetables, increasing water intake, decreasing liquid calories, and emotional eating strategies.  Lauren Briggs has agreed to follow-up with our clinic in 1 week. She was informed of the importance of frequent follow-up visits to maximize her success with intensive lifestyle modifications for her multiple health conditions.   Objective:   Blood pressure 126/65, pulse 80, temperature 98.1 F (36.7 C), height 5\' 4"  (1.626 m), weight 226 lb (102.5 kg), SpO2 99 %. Body mass index is 38.79 kg/m.  General: Cooperative, alert, well developed, in no acute distress. HEENT: Conjunctivae and lids unremarkable. Cardiovascular: Regular rhythm.  Lungs: Normal work of breathing. Neurologic: No focal deficits.   Lab Results  Component Value Date   CREATININE 0.73 12/20/2019   BUN 25 (H) 12/20/2019   NA 138 12/20/2019   K 4.1 12/20/2019   CL 103 12/20/2019   CO2 26 12/20/2019   Lab Results  Component Value Date   ALT 22 03/02/2019   AST 24 03/02/2019   ALKPHOS 60 03/02/2019   BILITOT 0.3 03/02/2019   Lab Results  Component Value Date   HGBA1C 5.8 (H) 01/03/2021   HGBA1C 6.1 (H) 06/14/2019   HGBA1C 6.3 (H) 03/02/2019   HGBA1C 6.3 (H) 11/09/2018   Lab  Results  Component Value Date   INSULIN 8.6 03/02/2019   Lab Results  Component Value Date   TSH 0.964 01/03/2021   Lab Results  Component Value Date   CHOL 197 01/03/2021   HDL 53 01/03/2021   LDLCALC 117 (H)  01/03/2021   TRIG 156 (H) 01/03/2021   CHOLHDL 3.7 01/03/2021   Lab Results  Component Value Date   VD25OH 42.5 03/02/2019   Lab Results  Component Value Date   WBC 9.0 12/20/2019   HGB 13.2 12/20/2019   HCT 41.5 12/20/2019   MCV 95.8 12/20/2019   PLT 356 12/20/2019   Lab Results  Component Value Date   IRON 91 03/02/2019   TIBC 480 (H) 03/02/2019   FERRITIN 101 03/02/2019   Attestation Statements:   Reviewed by clinician on day of visit: allergies, medications, problem list, medical history, surgical history, family history, social history, and previous encounter notes.  I, Water quality scientist, CMA, am acting as transcriptionist for Briscoe Deutscher, DO  I have reviewed the above documentation for accuracy and completeness, and I agree with the above. -  Briscoe Deutscher, DO, MS, FAAFP, DABOM - Family and Bariatric Medicine.

## 2021-02-21 ENCOUNTER — Other Ambulatory Visit (INDEPENDENT_AMBULATORY_CARE_PROVIDER_SITE_OTHER): Payer: Self-pay

## 2021-02-21 DIAGNOSIS — F418 Other specified anxiety disorders: Secondary | ICD-10-CM

## 2021-02-23 ENCOUNTER — Ambulatory Visit (INDEPENDENT_AMBULATORY_CARE_PROVIDER_SITE_OTHER): Payer: Medicare Other | Admitting: Psychologist

## 2021-02-23 DIAGNOSIS — F321 Major depressive disorder, single episode, moderate: Secondary | ICD-10-CM | POA: Diagnosis not present

## 2021-02-23 NOTE — Progress Notes (Signed)
                Lauren Zenker, PsyD 
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
## 2021-03-11 ENCOUNTER — Other Ambulatory Visit: Payer: Self-pay | Admitting: Internal Medicine

## 2021-03-14 ENCOUNTER — Ambulatory Visit (INDEPENDENT_AMBULATORY_CARE_PROVIDER_SITE_OTHER): Payer: Medicare Other

## 2021-03-14 ENCOUNTER — Ambulatory Visit (INDEPENDENT_AMBULATORY_CARE_PROVIDER_SITE_OTHER): Payer: Medicare Other | Admitting: Podiatry

## 2021-03-14 ENCOUNTER — Other Ambulatory Visit: Payer: Self-pay

## 2021-03-14 DIAGNOSIS — M778 Other enthesopathies, not elsewhere classified: Secondary | ICD-10-CM | POA: Diagnosis not present

## 2021-03-14 MED ORDER — MELOXICAM 15 MG PO TABS: 15.0000 mg | ORAL_TABLET | Freq: Every day | ORAL | 1 refills | Status: DC

## 2021-03-14 MED ORDER — BETAMETHASONE SOD PHOS & ACET 6 (3-3) MG/ML IJ SUSP
3.0000 mg | Freq: Once | INTRAMUSCULAR | Status: AC
  Administered 2021-03-14: 3 mg via INTRA_ARTICULAR

## 2021-03-14 MED ORDER — METHYLPREDNISOLONE 4 MG PO TBPK: ORAL_TABLET | ORAL | 0 refills | Status: DC

## 2021-03-14 NOTE — Progress Notes (Signed)
HPI: 66 y.o. female presenting today for new complaint regarding an acute flareup of left foot pain that has developed increasingly over the last week or 2.  Patient denies a history of injury.  She says the pain slowly increased over the past few weeks.  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   Neuromuscular disorder ([place])    Pre-diabetes    Prediabetes 11/10/2018   Shortness of breath dyspnea     Past Surgical History:  Procedure Laterality Date   ABDOMINAL HYSTERECTOMY     PARTIAL   ANTERIOR LAT LUMBAR FUSION N/A 08/14/2018   Procedure: extreme lateral interbody fusion - Lumbar two-Lumbar three  cortical screws and removal pedicle screws Lumbar -Sacral one;  Surgeon: Kary Kos, MD;  Location: [place];  Service: Neurosurgery;  Laterality: N/A;   APPENDECTOMY     BACK SURGERY     times 4   CARPAL TUNNEL RELEASE     BIL    CESAREAN SECTION     PARTIAL HYST   CLOSED REDUCTION ULNAR SHAFT     COLONOSCOPY     FOOT SURGERY     LT FOOT CYST REMOVED    HARDWARE REMOVAL N/A 08/14/2018   Procedure: Removal pedicle screws Lumbar -Sacral one with cortical screw fixation lumbar two-three;  Surgeon: Kary Kos, MD;  Location: [place];  Service: Neurosurgery;  Laterality: N/A;   NASAL SINUS SURGERY     1994    radial head Right 01/14/2016   at [place] Right 01/26/2016   Procedure: RIGHT RADIAL HEAD REPLACEMENT ARTHROPLASTY WITH LIGAMENT REPAIRS;  Surgeon: Charlotte Crumb, MD;  Location: [place];  Service: Orthopedics;  Laterality: Right;  Right elbow raial head replacement with possible ligament repairs as needed    UPPER GASTROINTESTINAL ENDOSCOPY       Allergies  Allergen Reactions   Versed [Midazolam] Other (See Comments)    Stroke   Iodine Nausea And Vomiting    During a myelogram    Lipitor [Atorvastatin] Other (See Comments)    headaches   Zocor [Simvastatin] Other (See Comments)    Reports leg cramps w/ doses over 20mg  daily.   Propoxyphene Hcl Palpitations    darvon      Physical Exam: General: The patient is alert and oriented x3 in no acute distress.  Dermatology: Skin is warm, dry and supple bilateral lower extremities. Negative for open lesions or macerations.  Vascular: Palpable pedal pulses bilaterally. Capillary refill within normal limits.  Negative for any significant edema or erythema  Neurological: Light touch and protective threshold grossly intact  Musculoskeletal Exam: No pedal deformities noted.  Pain on palpation throughout the midtarsal joint left foot  Radiographic Exam:  Degenerative changes noted throughout the midtarsal joint of the left foot as well as the TMT joints.  No acute acute fractures noted.  Periarticular spurring noted.  Assessment: 1.  DJD/capsulitis left midfoot   Plan of Care:  1. Patient evaluated. X-Rays reviewed.  2.  Injection of 0.5 cc Celestone Soluspan injected into the left midtarsal joint 3.  Prescription for Medrol Dosepak 4.  Prescription for meloxicam 15  mg daily 5.  Recommend good supportive shoes and sneakers.  Patient is considering getting the shoes with a collapsible heel so she does not have to bend over and put her shoes on.  She does have a history of lumbar fusion 6.  Return to clinic as needed  *Used to be a Radio broadcast assistant.  I also treat her husband, who is a Engineer, maintenance (IT), Martyn Ehrich, DPM [facility]  Dr. Edrick Kins, DPM    [address], [place] [address]                Office [phone]  Fax [phone]

## 2021-03-19 ENCOUNTER — Encounter (HOSPITAL_COMMUNITY): Payer: Self-pay | Admitting: Registered Nurse

## 2021-03-19 ENCOUNTER — Ambulatory Visit (HOSPITAL_COMMUNITY)
Admission: EM | Admit: 2021-03-19 | Discharge: 2021-03-19 | Disposition: A | Discharge status: Home / Self Care | Payer: Medicare Other | Attending: Registered Nurse | Admitting: Registered Nurse

## 2021-03-19 DIAGNOSIS — F4323 Adjustment disorder with mixed anxiety and depressed mood: Secondary | ICD-10-CM | POA: Diagnosis present

## 2021-03-19 MED ORDER — HYDROXYZINE PAMOATE 25 MG PO CAPS: 25.0000 mg | ORAL_CAPSULE | Freq: Three times a day (TID) | ORAL | 0 refills | Status: DC | PRN
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
## 2021-03-19 NOTE — Discharge Summary (Signed)
Lauren Briggs to be D/C'd Home per NP order. Discussed with the patient and all questions fully answered.  An After Visit Summary was printed and given to the patient. Patient escorted out, and D/C home via private auto.  Lauren Briggs  03/19/2021 5:12 PM
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
## 2021-03-20 ENCOUNTER — Telehealth (HOSPITAL_COMMUNITY): Payer: Self-pay | Admitting: Psychiatry

## 2021-03-20 NOTE — Telephone Encounter (Signed)
D:  Lauren Rankin, NP referred pt to [facility].  A:  Placed call to orient pt, but pt is declining group.  States she is wanting individual counseling.  Provided pt with [facility], [facility], [facility] and [facility] phone #'s.  Inform Lauren Rankin, NP.  Encouraged pt to call the case mgr back if she changes her mind about [facility].

## 2021-03-22 ENCOUNTER — Ambulatory Visit (INDEPENDENT_AMBULATORY_CARE_PROVIDER_SITE_OTHER): Payer: Medicare Other | Admitting: Nurse Practitioner

## 2021-03-22 ENCOUNTER — Other Ambulatory Visit: Payer: Self-pay

## 2021-03-22 VITALS — Ht 64.0 in | Wt 225.8 lb

## 2021-03-22 DIAGNOSIS — F322 Major depressive disorder, single episode, severe without psychotic features: Secondary | ICD-10-CM | POA: Diagnosis not present

## 2021-03-22 DIAGNOSIS — I10 Essential (primary) hypertension: Secondary | ICD-10-CM

## 2021-03-22 DIAGNOSIS — F418 Other specified anxiety disorders: Secondary | ICD-10-CM

## 2021-03-22 MED ORDER — ESCITALOPRAM OXALATE 10 MG PO TABS: ORAL_TABLET | ORAL | 0 refills | Status: DC

## 2021-03-22 MED ORDER — TRAZODONE HCL 50 MG PO TABS: 50.0000 mg | ORAL_TABLET | Freq: Every day | ORAL | 3 refills | Status: DC

## 2021-03-22 NOTE — Patient Instructions (Signed)
Thank you for choosing [facility] for your Primary Care needs. I am excited for the opportunity to partner with you to meet your health care goals. It was a pleasure meeting you today!  Recommendations from today's visit: I have sent the trazodone and lexapro for you to the pharmacy. I want to touch base with you in 2 weeks to see how you are doing.   Information on diet, exercise, and health maintenance recommendations are listed below. This is information to help you be sure you are on track for optimal health and monitoring.   Please look over this and let us know if you have any questions or if you have completed any of the health maintenance outside of [facility] so that we can be sure your records are up to date.  ___________________________________________________________ About Me: I am an Adult-Geriatric Nurse Practitioner with a background in caring for patients for more than 20 years with a strong intensive care background. I provide primary care and sports medicine services to patients age 35 and older within this office. My education had a strong focus on caring for the older adult population, which I am passionate about. I am also the director of the APP Fellowship with [facility].   My desire is to provide you with the best service through preventive medicine and supportive care. I consider you a part of the medical team and value your input. I work diligently to ensure that you are heard and your needs are met in a safe and effective manner. I want you to feel comfortable with me as your provider and want you to know that your health concerns are important to me.  For your information, our office hours are: Monday, Tuesday, and Thursday 8:00 AM - 5:00 PM Wednesday and Friday 8:00 AM - 12:00 PM.   In my time away from the office I am teaching new APP's within the system and am unavailable, but my partner, Dr. Burnard Bunting is in the office for emergent  needs.   If you have questions or concerns, please call our office at [phone] or send [place] a MyChart message and we will respond as quickly as possible.  ____________________________________________________________ MyChart:  For all urgent or time sensitive needs we ask that you please call the office to avoid delays. Our number is [phone]. MyChart is not constantly monitored and due to the large volume of messages a day, replies may take up to 72 business hours.  MyChart Policy: MyChart allows for you to see your visit notes, after visit summary, provider recommendations, lab and tests results, make an appointment, request refills, and contact your provider or the office for non-urgent questions or concerns. Providers are seeing patients during normal business hours and do not have built in time to review MyChart messages.  We ask that you allow a minimum of 3 business days for responses to [employer]. For this reason, please do not send urgent requests through [place]. Please call the office at [phone]. New and ongoing conditions may require a visit. We have virtual and in person visit available for your convenience.  Complex MyChart concerns may require a visit. Your provider may request you schedule a virtual or in person visit to ensure we are providing the best care possible. MyChart messages sent after 11:00 AM on Friday will not be received by the provider until Monday morning.    Lab and Test Results: You will receive your lab and test results  on MyChart as soon as they are completed and results have been sent by the lab or testing facility. Due to this service, you will receive your results BEFORE your provider.  I review lab and tests results each morning prior to seeing patients. Some results require collaboration with other providers to ensure you are receiving the most appropriate care. For this reason, we ask that you please allow a minimum of 3-5 business days  from the time the ALL results have been received for your provider to receive and review lab and test results and contact you about these.  Most lab and test result comments from the provider will be sent through [place]. Your provider may recommend changes to the plan of care, follow-up visits, repeat testing, ask questions, or request an office visit to discuss these results. You may reply directly to this message or call the office at [phone] to provide information for the provider or set up an appointment. In some instances, you will be called with test results and recommendations. Please let us know if this is preferred and we will make note of this in your chart to provide this for you.    If you have not heard a response to your lab or test results in 5 business days from all results returning to [place], please call the office to let us know. We ask that you please avoid calling prior to this time unless there is an emergent concern. Due to high call volumes, this can delay the resulting process.  After Hours: For all non-emergency after hours needs, please call the office at [phone] and select the option to reach the on-call provider service. On-call services are shared between multiple [facility] offices and therefore it will not be possible to speak directly with your provider. On-call providers may provide medical advice and recommendations, but are unable to provide refills for maintenance medications.  For all emergency or urgent medical needs after normal business hours, we recommend that you seek care at the closest Urgent Care or Emergency Department to ensure appropriate treatment in a timely manner.  [facility] [place] has a 24 hour emergency room located on the ground floor for your convenience.   Urgent Concerns During the Business Day Providers are seeing patients from 8AM to [place] with a busy schedule and are most often not able to respond to non-urgent  calls until the end of the day or the next business day. If you should have URGENT concerns during the day, please call and speak to the nurse or schedule a same day appointment so that we can address your concern without delay.   Thank you, again, for choosing me as your health care partner. I appreciate your trust and look forward to learning more about you.   Worthy Keeler, DNP, AGNP-c ___________________________________________________________  Health Maintenance Recommendations Screening Testing Mammogram Every 1 -2 years based on history and risk factors Starting at age 3 Pap Smear Ages 21-39 every 3 years Ages 94-65 every 5 years with HPV testing More frequent testing may be required based on results and history Colon Cancer Screening Every 1-10 years based on test performed, risk factors, and history Starting at age 71 Bone Density Screening Every 2-10 years based on history Starting at age 3 for women Recommendations for men differ based on medication usage, history, and risk factors AAA Screening One time ultrasound Men 22-98 years old who have every smoked Lung Cancer Screening Low Dose Lung CT every 12 months  Age 13-80 years with a 30 pack-year smoking history who still smoke or who have quit within the last 15 years  Screening Labs Routine  Labs: Complete Blood Count (CBC), Complete Metabolic Panel (CMP), Cholesterol (Lipid Panel) Every 6-12 months based on history and medications May be recommended more frequently based on current conditions or previous results Hemoglobin A1c Lab Every 3-12 months based on history and previous results Starting at age 45 or earlier with diagnosis of diabetes, high cholesterol, BMI >26, and/or risk factors Frequent monitoring for patients with diabetes to ensure blood sugar control Thyroid Panel (TSH w/ T3 & T4) Every 6 months based on history, symptoms, and risk factors May be repeated more often if on medication HIV One  time testing for all patients 22 and older May be repeated more frequently for patients with increased risk factors or exposure Hepatitis C One time testing for all patients 68 and older May be repeated more frequently for patients with increased risk factors or exposure Gonorrhea, Chlamydia Every 12 months for all sexually active persons 13-24 years Additional monitoring may be recommended for those who are considered high risk or who have symptoms PSA Men 32-49 years old with risk factors Additional screening may be recommended from age 64-69 based on risk factors, symptoms, and history  Vaccine Recommendations Tetanus Booster All adults every 10 years Flu Vaccine All patients 6 months and older every year COVID Vaccine All patients 12 years and older Initial dosing with booster May recommend additional booster based on age and health history HPV Vaccine 2 doses all patients age 108-26 Dosing may be considered for patients over 26 Shingles Vaccine (Shingrix) 2 doses all adults 53 years and older Pneumonia (Pneumovax 23) All adults 84 years and older May recommend earlier dosing based on health history Pneumonia (Prevnar 7) All adults 59 years and older Dosed 1 year after Pneumovax 23  Additional Screening, Testing, and Vaccinations may be recommended on an individualized basis based on family history, health history, risk factors, and/or exposure.  __________________________________________________________  Diet Recommendations for All Patients  I recommend that all patients maintain a diet low in saturated fats, carbohydrates, and cholesterol. While this can be challenging at first, it is not impossible and small changes can make big differences.  Things to try: Decreasing the amount of soda, sweet tea, and/or juice to one or less per day and replace with water While water is always the first choice, if you do not like water you may consider adding a water additive without  sugar to improve the taste other sugar free drinks Replace potatoes with a brightly colored vegetable at dinner Use healthy oils, such as canola oil or olive oil, instead of butter or hard margarine Limit your bread intake to two pieces or less a day Replace regular pasta with low carb pasta options Bake, broil, or grill foods instead of frying Monitor portion sizes  Eat smaller, more frequent meals throughout the day instead of large meals  An important thing to remember is, if you love foods that are not great for your health, you don't have to give them up completely. Instead, allow these foods to be a reward when you have done well. Allowing yourself to still have special treats every once in a while is a nice way to tell yourself thank you for working hard to keep yourself healthy.   Also remember that every day is a new day. If you have a bad day and "fall off the wagon", you  can still climb right back up and keep moving along on your journey!  We have resources available to help you!  Some websites that may be helpful include: www.http://carter.biz/  Www.[employer]Well[employer].com _____________________________________________________________  Activity Recommendations for All Patients  I recommend that all adults get at least 20 minutes of moderate physical activity that elevates your heart rate at least 5 days out of the week.  Some examples include: Walking or jogging at a pace that allows you to carry on a conversation Cycling (stationary bike or outdoors) Water aerobics Yoga Weight lifting Dancing If physical limitations prevent you from putting stress on your joints, exercise in a pool or seated in a chair are excellent options.  Do determine your MAXIMUM heart rate for activity: YOUR AGE - 220 = MAX HeartRate   Remember! Do not push yourself too hard.  Start slowly and build up your pace, speed, weight, time in exercise, etc.  Allow your body to rest between exercise and get good  sleep. You will need more water than normal when you are exerting yourself. Do not wait until you are thirsty to drink. Drink with a purpose of getting in at least 8, 8 ounce glasses of water a day plus more depending on how much you exercise and sweat.    If you begin to develop dizziness, chest pain, abdominal pain, jaw pain, shortness of breath, headache, vision changes, lightheadedness, or other concerning symptoms, stop the activity and allow your body to rest. If your symptoms are severe, seek emergency evaluation immediately. If your symptoms are concerning, but not severe, please let us know so that we can recommend further evaluation.

## 2021-03-23 NOTE — Progress Notes (Signed)
Lauren Render, DNP, AGNP-c Primary Care & Sports Medicine [address]   [place], [place] [address] [phone]  New patient visit   Patient: Lauren Briggs   DOB: 05/27/1955   65 y.o. Female  MRN: [id number] Visit Date: 03/22/2021  Patient Care Team: Kyen Taite, Coralee Pesa, NP as PCP - General (Nurse Practitioner) Kary Kos, MD as Consulting Physician (Neurosurgery)  Today's healthcare provider: Orma Render, NP   Chief Complaint  Patient presents with   Establish Care   Depression   Anxiety   Subjective    Lauren Briggs is a 65 y.o. female who presents today as a new patient to establish care.    Patient endorses the following concerns presently: Lauren Briggs endorses severe depression and anxiety symptoms related to recent events in her marriage. She tells me in December she discovered her husband was having a relationship with her former housekeeper. Since that time she tells me that she has been suffering extensively with severe anxiety symptoms. She reports that she and her husband are still living together and she is currently still working in his business, which exacerbates her symptoms. She has not told her children of the difficulties, but one child has found out about the incident. She reports that she does not want her children to know.  She tells me she has seen more than one counselor for her symptoms, but she reports that she is not impressed with the results. She is currently seeing a joint counselor with her husband and she tells me she feels that the counselor and her husband "attack" her verbally during the sessions. She does not plan to continue with his services.  She reports that she feels her husband continues to lie to her about the relationship with the other party and she can no longer trust him. She reports that she is having difficulty sleeping in response to the anxiety. She reports that her weight management provider has started her on  medication to help with panic symptoms. She went to the [facility] on Monday of this week in an effort to try to get in with a different counselor. She was unable to get an appointment until the end of March, which she reports is frustrating. She endorses having an appointment on Monday with an independent counselor at [facility] in [place]- she is hopeful this will be beneficial.  She also reports a strained relationship with her mother when she was living and the loss of her father at a young age to a sudden event. She tells me that her mother "told everyone else and waited to tell me last" about her fathers death. She reports that she does not like to have information hidden from her. She has been on lexapro in the past and responded well to this medication. She is not taking any daily medications for maintenance therapy at this time. She also tells me that she has a history of having a "hair trigger" and is easily annoyed and irritable which worsened when she went through menopause in her 59's.   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  Neuromuscular disorder ([place])    Pre-diabetes    Prediabetes 11/10/2018   Shortness of breath dyspnea    Past Surgical History:  Procedure Laterality Date   ABDOMINAL HYSTERECTOMY     PARTIAL   ANTERIOR LAT LUMBAR FUSION N/A 08/14/2018   Procedure: extreme lateral interbody fusion - Lumbar two-Lumbar three  cortical screws and removal pedicle screws Lumbar -Sacral one;  Surgeon: Kary Kos, MD;  Location: [facility];  Service: Neurosurgery;  Laterality: N/A;   APPENDECTOMY     BACK SURGERY     times 4   CARPAL TUNNEL RELEASE     BIL    CESAREAN SECTION      PARTIAL HYST   CLOSED REDUCTION ULNAR SHAFT     COLONOSCOPY     FOOT SURGERY     LT FOOT CYST REMOVED    HARDWARE REMOVAL N/A 08/14/2018   Procedure: Removal pedicle screws Lumbar -Sacral one with cortical screw fixation lumbar two-three;  Surgeon: Kary Kos, MD;  Location: [facility];  Service: Neurosurgery;  Laterality: N/A;   NASAL SINUS SURGERY     1994    radial head Right 01/14/2016   at [place] Right 01/26/2016   Procedure: RIGHT RADIAL HEAD REPLACEMENT ARTHROPLASTY WITH LIGAMENT REPAIRS;  Surgeon: Charlotte Crumb, MD;  Location: [facility];  Service: Orthopedics;  Laterality: Right;  Right elbow raial head replacement with possible ligament repairs as needed    UPPER GASTROINTESTINAL ENDOSCOPY     Family Status  Relation Name Status   Mother  Deceased   Father  Deceased   MGM  Deceased   Neg Hx  (Not Specified)   Family History  Problem Relation Age of Onset   Dementia Mother    Heart attack Mother    Hypertension Mother    Heart disease Mother    Hyperlipidemia Mother    Heart attack Father 58   Heart disease Father    Sudden death Father    Hyperlipidemia Father    Heart attack Maternal Grandmother 91   Stroke Maternal Grandmother    Colon cancer Neg Hx    Colon polyps Neg Hx    Esophageal cancer Neg Hx    Stomach cancer Neg Hx    Rectal cancer Neg Hx    Social History   Socioeconomic History   Marital status: Married    Spouse name: keith   Number of children: Not on file   Years of education: Not on file   Highest education level: Not on file  Occupational History   Occupation: retired Radio broadcast assistant  Tobacco Use   Smoking status: Former    Years: 3.00    Types: Cigarettes    Quit date: 1981    Years since quitting: 42.2   Smokeless tobacco: Former    Quit date: 01/22/1979  Vaping Use   Vaping Use: Never used  Substance and Sexual Activity   Alcohol use: Yes    Comment: occasional bourbon and ginger ale    Drug  use: No   Sexual activity: Not on file  Other Topics Concern   Not on file  Social History Narrative   Not on file   Social Determinants of Health   Financial Resource Strain: Not on file  Food Insecurity: Not on file  Transportation Needs: Not on file  Physical Activity: Not on file  Stress: Not on file  Social Connections: Not on file   Outpatient Medications Prior to Visit  Medication Sig  Coenzyme Q10 (COQ-10) 100 MG CAPS Take 100 mg by mouth in the morning and at bedtime. otc   estradiol (ESTRACE) 1 MG tablet Take 1 mg by mouth every morning.    ezetimibe (ZETIA) 10 MG tablet TAKE 1 TABLET(10 MG) BY MOUTH DAILY   hydrOXYzine (VISTARIL) 25 MG capsule Take 1 capsule (25 mg total) by mouth 3 (three) times daily as needed for anxiety.   lisinopril-hydrochlorothiazide (ZESTORETIC) 20-12.5 MG tablet TAKE 1 TABLET BY MOUTH DAILY   meloxicam (MOBIC) 15 MG tablet Take 1 tablet (15 mg total) by mouth daily.   Multiple Vitamin (MULTIVITAMIN WITH MINERALS) TABS Take 1 tablet by mouth daily after supper. Women's 50 plus   propranolol (INDERAL) 20 MG tablet Take 1 tablet (20 mg total) by mouth 3 (three) times daily as needed.   Semaglutide, 1 MG/DOSE, (OZEMPIC, 1 MG/DOSE,) 4 MG/3ML SOPN Inject 1 mg into the skin once a week.   [DISCONTINUED] ALPRAZolam (XANAX) 0.25 MG tablet Take 1 tablet (0.25 mg total) by mouth 2 (two) times daily as needed for anxiety.   [DISCONTINUED] methylPREDNISolone (MEDROL DOSEPAK) 4 MG TBPK tablet 6 day dose pack - take as directed   No facility-administered medications prior to visit.   Allergies  Allergen Reactions   Versed [Midazolam] Other (See Comments)    Stroke   Iodine Nausea And Vomiting    During a myelogram    Lipitor [Atorvastatin] Other (See Comments)    headaches   Zocor [Simvastatin] Other (See Comments)    Reports leg cramps w/ doses over 20mg  daily.   Propoxyphene Hcl Palpitations    darvon    Immunization History  Administered  Date(s) Administered   Tdap 09/01/2014    Health Maintenance Due: Health Maintenance  Topic Date Due   COVID-19 Vaccine (1) Never done   HIV Screening  Never done   Hepatitis C Screening  Never done   Zoster Vaccines- Shingrix (1 of 2) Never done   Pneumonia Vaccine 41+ Years old (1 - PCV) Never done   DEXA SCAN  Never done   PAP SMEAR-Modifier  10/31/2020   MAMMOGRAM  11/01/2020   TETANUS/TDAP  08/31/2024   COLONOSCOPY (Pts 45-31yrs Insurance coverage will need to be confirmed)  02/10/2028   HPV VACCINES  Aged Out   INFLUENZA VACCINE  Discontinued    Review of Systems All review of systems negative except what is listed in the HPI   Objective    There were no vitals taken for this visit. Physical Exam Vitals and nursing note reviewed.  Constitutional:      General: She is not in acute distress.    Appearance: Normal appearance.  Eyes:     Extraocular Movements: Extraocular movements intact.     Conjunctiva/sclera: Conjunctivae normal.     Pupils: Pupils are equal, round, and reactive to light.  Neck:     Vascular: No carotid bruit.  Cardiovascular:     Rate and Rhythm: Normal rate and regular rhythm.     Pulses: Normal pulses.     Heart sounds: Normal heart sounds. No murmur heard. Pulmonary:     Effort: Pulmonary effort is normal.     Breath sounds: Normal breath sounds. No wheezing.  Abdominal:     General: Bowel sounds are normal.     Palpations: Abdomen is soft.  Musculoskeletal:        General: Normal range of motion.     Cervical back: Normal range of motion.     Right lower  leg: No edema.     Left lower leg: No edema.  Skin:    General: Skin is warm and dry.     Capillary Refill: Capillary refill takes less than 2 seconds.  Neurological:     General: No focal deficit present.     Mental Status: She is alert and oriented to person, place, and time.  Psychiatric:        Mood and Affect: Mood is anxious and depressed. Affect is tearful.        Speech:  Speech is rapid and pressured.        Behavior: Behavior is agitated.        Thought Content: Thought content normal.        Judgment: Judgment is impulsive.    No results found for any visits on 03/22/21.  Assessment & Plan      Problem List Items Addressed This Visit     Essential hypertension    Patient in severe anxiety state while in office today- BP readings unable to be obtained today. No alarm symptoms present. Recommend monitoring BP at home at times when at most calm and when no caffeine or alcohol has been consumed. Notify office for readings > 150/90 that remain elevated after retaking 15 minutes with rest.  Continue current medications.  Will plan to obtain labs in near future.       Other specified anxiety disorders - Primary    Severe exacerbation of anxiety related to situation with spouse. During visit today patient severely distressed with disorganized thought processes and rumination over husbands affair. No alarm symptoms present for SI/HI at this time. Event initially occurred in December with continued significant emotional reaction. I do suspect that ongoing housing and work environment with her spouse are contributing the continuation of symptoms as she is has not been able to remove herself from the situation. At this time I do feel that medication and continued counseling would be beneficial. Discussed with patient that counseling services alone may be more beneficial to help her evaluate what she would like out of the relationship moving forward and allow her to assess her own emotional response. At this time I do feel that the suddenness of the situation may have triggered feelings related to the death of her father which has resulted in increased severity of response.  Will plan to start on lexapro for symptoms and trazodone for sleep and nighttime anxiety symptoms.  Encouraged patient to keep appointment on Monday with counseling.  She will notify for seek  emergency evaluation if she begins to have thoughts of SI/HI.        Relevant Medications   escitalopram (LEXAPRO) 10 MG tablet   traZODone (DESYREL) 50 MG tablet   Depression, major, single episode, severe ([place])    See other specified anxiety disorders      Relevant Medications   escitalopram (LEXAPRO) 10 MG tablet   traZODone (DESYREL) 50 MG tablet     Return in about 2 weeks (around 04/05/2021) for  virtual mood (last of day).     Time: 75 minutes, >50% spent counseling, care coordination, chart review, and documentation.    Ules Marsala, Coralee Pesa, NP, DNP, AGNP-C Primary Care & Sports Medicine at [facility]

## 2021-03-25 ENCOUNTER — Encounter (HOSPITAL_BASED_OUTPATIENT_CLINIC_OR_DEPARTMENT_OTHER): Payer: Self-pay | Admitting: Nurse Practitioner

## 2021-03-25 DIAGNOSIS — F418 Other specified anxiety disorders: Secondary | ICD-10-CM | POA: Insufficient documentation

## 2021-03-25 DIAGNOSIS — F322 Major depressive disorder, single episode, severe without psychotic features: Secondary | ICD-10-CM | POA: Insufficient documentation

## 2021-03-25 NOTE — Assessment & Plan Note (Signed)
See other specified anxiety disorders ?

## 2021-03-25 NOTE — Assessment & Plan Note (Signed)
Severe exacerbation of anxiety related to situation with spouse. During visit today patient severely distressed with disorganized thought processes and rumination over husbands affair. No alarm symptoms present for SI/HI at this time. Event initially occurred in December with continued significant emotional reaction. I do suspect that ongoing housing and work environment with her spouse are contributing the continuation of symptoms as she is has not been able to remove herself from the situation. At this time I do feel that medication and continued counseling would be beneficial. Discussed with patient that counseling services alone may be more beneficial to help her evaluate what she would like out of the relationship moving forward and allow her to assess her own emotional response. At this time I do feel that the suddenness of the situation may have triggered feelings related to the death of her father which has resulted in increased severity of response.  ?Will plan to start on lexapro for symptoms and trazodone for sleep and nighttime anxiety symptoms.  ?Encouraged patient to keep appointment on Monday with counseling.  ?She will notify for seek emergency evaluation if she begins to have thoughts of SI/HI.  ? ?

## 2021-03-25 NOTE — Assessment & Plan Note (Signed)
Patient in severe anxiety state while in office today- BP readings unable to be obtained today. No alarm symptoms present. Recommend monitoring BP at home at times when at most calm and when no caffeine or alcohol has been consumed. Notify office for readings > 150/90 that remain elevated after retaking 15 minutes with rest.  ?Continue current medications.  ?Will plan to obtain labs in near future.  ?

## 2021-03-25 NOTE — Assessment & Plan Note (Addendum)
>>  ASSESSMENT AND PLAN FOR ADJUSTMENT DISORDER WITH MIXED ANXIETY AND DEPRESSED MOOD WRITTEN ON 07/15/2022 10:04 PM BY Varick Keys E, NP  >>ASSESSMENT AND PLAN FOR DEPRESSION, MAJOR, SINGLE EPISODE, SEVERE (HCC) WRITTEN ON 03/25/2021 10:06 PM BY Keira Bohlin E, NP  See other specified anxiety disorders  >>ASSESSMENT AND PLAN FOR OTHER SPECIFIED ANXIETY DISORDERS WRITTEN ON 03/25/2021 10:05 PM BY Keren Alverio E, NP  Severe exacerbation of anxiety related to situation with spouse. During visit today patient severely distressed with disorganized thought processes and rumination over husbands affair. No alarm symptoms present for SI/HI at this time. Event initially occurred in December with continued significant emotional reaction. I do suspect that ongoing housing and work environment with her spouse are contributing the continuation of symptoms as she is has not been able to remove herself from the situation. At this time I do feel that medication and continued counseling would be beneficial. Discussed with patient that counseling services alone may be more beneficial to help her evaluate what she would like out of the relationship moving forward and allow her to assess her own emotional response. At this time I do feel that the suddenness of the situation may have triggered feelings related to the death of her father which has resulted in increased severity of response.  Will plan to start on lexapro for symptoms and trazodone for sleep and nighttime anxiety symptoms.  Encouraged patient to keep appointment on Monday with counseling.  She will notify for seek emergency evaluation if she begins to have thoughts of SI/HI.

## 2021-04-01 ENCOUNTER — Other Ambulatory Visit: Payer: Self-pay | Admitting: Internal Medicine

## 2021-04-05 ENCOUNTER — Ambulatory Visit (INDEPENDENT_AMBULATORY_CARE_PROVIDER_SITE_OTHER): Payer: Medicare Other | Admitting: Nurse Practitioner

## 2021-04-05 ENCOUNTER — Encounter (HOSPITAL_BASED_OUTPATIENT_CLINIC_OR_DEPARTMENT_OTHER): Payer: Self-pay | Admitting: Nurse Practitioner

## 2021-04-05 ENCOUNTER — Other Ambulatory Visit: Payer: Self-pay

## 2021-04-05 DIAGNOSIS — Z8249 Family history of ischemic heart disease and other diseases of the circulatory system: Secondary | ICD-10-CM | POA: Diagnosis not present

## 2021-04-05 DIAGNOSIS — F418 Other specified anxiety disorders: Secondary | ICD-10-CM

## 2021-04-05 DIAGNOSIS — E782 Mixed hyperlipidemia: Secondary | ICD-10-CM

## 2021-04-05 DIAGNOSIS — I10 Essential (primary) hypertension: Secondary | ICD-10-CM

## 2021-04-05 DIAGNOSIS — F322 Major depressive disorder, single episode, severe without psychotic features: Secondary | ICD-10-CM

## 2021-04-05 MED ORDER — ESCITALOPRAM OXALATE 10 MG PO TABS: 10.0000 mg | ORAL_TABLET | Freq: Every day | ORAL | 3 refills | Status: DC

## 2021-04-05 MED ORDER — FENOFIBRATE 134 MG PO CAPS: 134.0000 mg | ORAL_CAPSULE | Freq: Every day | ORAL | 11 refills | Status: DC

## 2021-04-05 NOTE — Assessment & Plan Note (Signed)
Significant improvement with addition of lexapro and trazodone to medication management.  ?Patient is also working with counseling services and I do feel this is helping. She has been able to have conversations with her husband and they are working through this issue. She is still hesitant to discuss the future and plans, but she is more optimistic about their ability to work through the issue. I am very pleased with how much better she sounds today and how well she is doing.  ?Will plan to continue with the lexapro and trazodone- no dose increases at this time. Plan to monitor again in about 4 weeks.  ?

## 2021-04-05 NOTE — Assessment & Plan Note (Signed)
See anxiety

## 2021-04-05 NOTE — Progress Notes (Signed)
Virtual Visit Encounter telephone visit. ? ? ?I connected with  [name] on 04/05/21 at  3:50 PM EDT by secure audio and/or video enabled telemedicine application. I verified that I am speaking with the correct person using two identifiers. ?  ?I introduced myself as a Designer, jewellery with the practice. The limitations of evaluation and management by telemedicine discussed with the patient and the availability of in person appointments. The patient expressed verbal understanding and consent to proceed. ? ?Participating parties in this visit include: Myself and patient ? ?The patient is: Patient Location: Home ?I am: Provider Location: Office/Clinic ?Subjective:   ? ?CC and HPI: Lauren Briggs is a 66 y.o. year old female presenting for follow up of mood.  ?She tells me she has had some discussions with her husband and things have improved.  ?She reports he has admitted he has done wrong and she feels that they may be able to save their relationship.  ?She is working [facility] and another appointment with counseling with [facility] next week. ?She endorses sleeping very well with the trazodone nightly and feels well rested in the morning without daytime sleepiness. She has been taking the lexapro daily and feels this is helping, as well. She endorses decreased anxiety and feelings of stress and overwhelm. She tells me that she feels more even keeled and not as tearful.  ?She would like to continue on the medication.  ? ?Past medical history, Surgical history, Family history not pertinant except as noted below, Social history, Allergies, and medications have been entered into the medical record, reviewed, and corrections made.  ? ?Review of Systems:  ?All review of systems negative except what is listed in the HPI ? ?Objective:   ? ?Alert and oriented x 4 ?Speaking in clear sentences with no shortness of breath. ?No distress. ? ?Impression and Recommendations:   ? ?Problem List Items Addressed This Visit    ? ? Other specified anxiety disorders  ?  Significant improvement with addition of lexapro and trazodone to medication management.  ?Patient is also working with counseling services and I do feel this is helping. She has been able to have conversations with her husband and they are working through this issue. She is still hesitant to discuss the future and plans, but she is more optimistic about their ability to work through the issue. I am very pleased with how much better she sounds today and how well she is doing.  ?Will plan to continue with the lexapro and trazodone- no dose increases at this time. Plan to monitor again in about 4 weeks.  ?  ?  ? Relevant Medications  ? escitalopram (LEXAPRO) 10 MG tablet  ? Depression, major, single episode, severe ([place])  ?  See anxiety ?  ?  ? Relevant Medications  ? escitalopram (LEXAPRO) 10 MG tablet  ? Essential hypertension - Primary  ? Relevant Medications  ? fenofibrate micronized (LOFIBRA) 134 MG capsule  ? Mixed hypercholesterolemia and hypertriglyceridemia  ? Relevant Medications  ? fenofibrate micronized (LOFIBRA) 134 MG capsule  ? Family history of heart disease  ? Relevant Medications  ? fenofibrate micronized (LOFIBRA) 134 MG capsule  ? ? ?orders and follow up as documented in EMR ?I discussed the assessment and treatment plan with the patient. The patient was provided an opportunity to ask questions and all were answered. The patient agreed with the plan and demonstrated an understanding of the instructions. ?  ?The patient was advised to call  back or seek an in-person evaluation if the symptoms worsen or if the condition fails to improve as anticipated. ? ?Follow-Up: in a month ? ?I provided 32 minutes of non-face-to-face interaction with this non face-to-face encounter including intake, same-day documentation, and chart review.  ? ?Orma Render, NP , DNP, AGNP-c ?[facility] ?Primary Care & Sports Medicine at [facility] ?[phone] ?[phone] (fax) ? ?

## 2021-04-05 NOTE — Assessment & Plan Note (Addendum)
>>  ASSESSMENT AND PLAN FOR ADJUSTMENT DISORDER WITH MIXED ANXIETY AND DEPRESSED MOOD WRITTEN ON 07/15/2022 10:04 PM BY Syndi Pua E, NP  >>ASSESSMENT AND PLAN FOR DEPRESSION, MAJOR, SINGLE EPISODE, SEVERE (HCC) WRITTEN ON 04/05/2021  8:30 PM BY Corbin Hott E, NP  See anxiety  >>ASSESSMENT AND PLAN FOR OTHER SPECIFIED ANXIETY DISORDERS WRITTEN ON 04/05/2021  8:30 PM BY Koltyn Kelsay E, NP  Significant improvement with addition of lexapro and trazodone to medication management.  Patient is also working with counseling services and I do feel this is helping. She has been able to have conversations with her husband and they are working through this issue. She is still hesitant to discuss the future and plans, but she is more optimistic about their ability to work through the issue. I am very pleased with how much better she sounds today and how well she is doing.  Will plan to continue with the lexapro and trazodone- no dose increases at this time. Plan to monitor again in about 4 weeks.

## 2021-04-16 ENCOUNTER — Ambulatory Visit (HOSPITAL_COMMUNITY): Payer: Medicare Other | Admitting: Clinical

## 2021-04-16 ENCOUNTER — Other Ambulatory Visit: Payer: Self-pay

## 2021-04-16 ENCOUNTER — Telehealth (HOSPITAL_COMMUNITY): Payer: Self-pay | Admitting: Clinical

## 2021-04-23 ENCOUNTER — Ambulatory Visit (HOSPITAL_BASED_OUTPATIENT_CLINIC_OR_DEPARTMENT_OTHER): Payer: Medicare Other | Admitting: Nurse Practitioner

## 2021-05-04 ENCOUNTER — Ambulatory Visit (INDEPENDENT_AMBULATORY_CARE_PROVIDER_SITE_OTHER): Payer: Medicare Other | Admitting: Nurse Practitioner

## 2021-05-04 ENCOUNTER — Encounter (HOSPITAL_BASED_OUTPATIENT_CLINIC_OR_DEPARTMENT_OTHER): Payer: Self-pay | Admitting: Nurse Practitioner

## 2021-05-04 VITALS — BP 130/100 | HR 86 | Temp 98.7°F | Ht 64.0 in | Wt 230.0 lb

## 2021-05-04 DIAGNOSIS — Z8673 Personal history of transient ischemic attack (TIA), and cerebral infarction without residual deficits: Secondary | ICD-10-CM

## 2021-05-04 DIAGNOSIS — I10 Essential (primary) hypertension: Secondary | ICD-10-CM | POA: Diagnosis not present

## 2021-05-04 DIAGNOSIS — R7303 Prediabetes: Secondary | ICD-10-CM

## 2021-05-04 DIAGNOSIS — G629 Polyneuropathy, unspecified: Secondary | ICD-10-CM | POA: Diagnosis not present

## 2021-05-04 DIAGNOSIS — Z6841 Body Mass Index (BMI) 40.0 and over, adult: Secondary | ICD-10-CM

## 2021-05-04 DIAGNOSIS — F418 Other specified anxiety disorders: Secondary | ICD-10-CM

## 2021-05-04 DIAGNOSIS — Z0000 Encounter for general adult medical examination without abnormal findings: Secondary | ICD-10-CM | POA: Diagnosis not present

## 2021-05-04 DIAGNOSIS — F322 Major depressive disorder, single episode, severe without psychotic features: Secondary | ICD-10-CM

## 2021-05-04 DIAGNOSIS — E782 Mixed hyperlipidemia: Secondary | ICD-10-CM

## 2021-05-04 MED ORDER — LISINOPRIL-HYDROCHLOROTHIAZIDE 20-12.5 MG PO TABS: 2.0000 | ORAL_TABLET | Freq: Every day | ORAL | 1 refills | Status: DC

## 2021-05-04 NOTE — Patient Instructions (Addendum)
It was a pleasure seeing you today. I hope your time spent with [place] was pleasant and helpful. Please let us know if there is anything we can do to improve the service you receive.  ? ? ?We will plan to monitor labs today to make sure everything looks ok. If we need to make any changes based on the labs, we will let you know.  ?I want you to increase your blood pressure medication to 2 tabs a day. Be sure to check your blood pressures at home at a time you are calm before you have had any caffeine to make sure that they are not staying high ?If your blood pressures are more than 140/85 on two back to back times, let me know  ?If this dose is working over the next couple of weeks, let me know and we will send the increased dose to the pharmacy.  ?I have sent the increased dose of the lexapro (escitalopram). For the medication you have at home, take two tabs at bedtime to make the increased dose until you run out. Let me know if this works and we will send in the increased dose.  ?You can take 1-2 pills of the trazodone at bedtime to help with sleep and anxiety. If the '50mg'$  doesn't work, then take the extra dose.  ? ? ? ?Important Office Information ?Lab Results ?If labs were ordered, please note that you will see results through [place] as soon as they come available from [facility].  ?It takes up to 5 business days for the results to be routed to me and for me to review them once all of the lab results have come through from [facility]. I will make recommendations based on your results and send these through [place] or someone from the office will call you to discuss. If your labs are abnormal, we may contact you to schedule a visit to discuss the results and make recommendations.  ?If you have not heard from [place] within 5 business days or you have waited longer than a week and your lab results have not come through on [place], please feel free to call the office or send a message through [place] to follow-up on these labs.   ? ?Referrals ?If referrals were placed today, the office where the referral was sent will contact you either by phone or through [place] to set up scheduling. Please note that it can take up to a week for the referral office to contact you. If you do not hear from them in a week, please contact the referral office directly to inquire about scheduling.  ? ?Condition Treated ?If your condition worsens or you begin to have new symptoms, please schedule a follow-up appointment for further evaluation. If you are not sure if an appointment is needed, you may call the office to leave a message for the nurse and someone will contact you with recommendations.  ?If you have an urgent or life threatening emergency, please do not call the office, but seek emergency evaluation by calling 911 or going to the nearest emergency room for evaluation.  ? ?MyChart and Phone Calls ?Please do not use MyChart for urgent messages. It may take up to 3 business days for MyChart messages to be read by staff and if they are unable to handle the request, an additional 3 business days for them to be routed to me and for my response.  ?Messages sent to the provider through [place] do not come directly to the  provider, please allow time for these messages to be routed and for me to respond.  ?We get a large volume of MyChart messages daily and these are responded to in the order received.  ? ?For urgent messages, please call the office at [phone] and speak with the front office staff or leave a message on the line of my assistant for guidance.  ?We are seeing patients from the hours of 8:00 am through 5:00 pm and calls directly to the nurse may not be answered immediately due to seeing patients, but your call will be returned as soon as possible.  ?Phone  messages received after 4:00 PM Monday through Thursday may not be returned until the following business day. Phone messages received after 11:00 AM on Friday may not be returned until  Monday.  ? ?After Hours ?We share on call hours with providers from other offices. If you have an urgent need after hours that cannot wait until the next business day, please contact the on call provider by calling the office number. A nurse will speak with you and contact the provider if needed for recommendations.  ?If you have an urgent or life threatening emergency after hours, please do not call the on call provider, but seek emergency evaluation by calling 911 or going to the nearest emergency room for evaluation.  ? ?Paperwork ?All paperwork requires a minimum of 5 days to complete and return to you or the designated personnel. Please keep this in mind when bringing in forms or sending requests for paperwork completion to the office.  ?  ?

## 2021-05-04 NOTE — Progress Notes (Signed)
?Worthy Keeler, DNP, AGNP-c ?[facility] ?[address] ?[address] ?[place], [place] [address] ?[phone] Office [phone] Fax ? ?ESTABLISHED PATIENT- Chronic Health and/or Follow-Up Visit ? ?Blood pressure (!) 130/100, pulse 86, temperature 98.7 ?F (37.1 ?C), height '5\' 4"'$  (1.626 m), weight 230 lb (104.3 kg), SpO2 97 %. ? ?Follow-up (Mood, BP) ? ? ?HPI ? ?Lauren Briggs is a 66 y.o. 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 ?She also tells me her dog is sick and requiring quite a bit of care due to kidney failure ?She is also still struggling with her relationship issues, but this is still quite distressing ?No chest pain, shortness of breath, dizziness, vision changes, headaches, LE edema ?Does not check BP at home ?She tells me she feels the elevations are transient related to her current life situations  ?Needs lab work ?Mood ?She has [facility] at [facility] ?She likes her counselor and feels that she is getting what she needs ?Feels this is a good fit for her ?She tells me her husband is not happy that she is not giving information to him about the counseling ?Would like to remain in counseling alone ?Does not wish for her husband to be a part of her counseling ?In the past with joint counseling she feels like she was made out to be the "bad guy" or they took her husbands "side" ?Her husband is telling everyone that she made a "Mountain out of a mole-hill" referring to the relationship he had with the employee ?She expresses anger over his reaction to her emotions ?She tells me that she does not feel he has fully accepted the impact of his actions ?She is still having anxiety and depression ?She reports that she has a "broken heart that can't be broken" ?Endorses anger with her husband ?She  tells me she is not sure if she can forgive him for what he has done ?She reports that her husband has admitted to being wrong, but she is still catching him lying to her ?Has recently found receipts that he was untruthful about ?Has confronted him with the lies, but he typically brushes her off ?Feels that the current medication is working for her ?Less anxiety- more anger at this time ?Does not feel she needs an increase in dose ? ?PMH reviewed with patient and pertinent findings discussed with appropriate monitoring provided today.  ?ROS ?All ROS negative with exception of what is listed in HPI ? ?PHYSICAL EXAM ?Physical Exam ?Vitals and nursing note reviewed.  ?Constitutional:   ?   Appearance: Normal appearance. She is obese.  ?HENT:  ?   Head: Normocephalic.  ?Eyes:  ?   Extraocular Movements: Extraocular movements intact.  ?   Conjunctiva/sclera: Conjunctivae normal.  ?   Pupils: Pupils are equal, round, and reactive to light.  ?Neck:  ?   Vascular: No carotid bruit.  ?Cardiovascular:  ?   Rate and Rhythm: Regular rhythm. Tachycardia present.  ?   Pulses: Normal pulses.  ?   Heart sounds: Normal heart sounds.  ?Pulmonary:  ?   Effort: Pulmonary effort is normal.  ?   Breath sounds: Normal breath sounds.  ?Abdominal:  ?   General: Bowel sounds are normal.  ?   Palpations: Abdomen is soft.  ?Musculoskeletal:     ?  General: Normal range of motion.  ?   Cervical back: Normal range of motion.  ?   Right lower leg: No edema.  ?   Left lower leg: No edema.  ?Lymphadenopathy:  ?   Cervical: No cervical adenopathy.  ?Skin: ?   General: Skin is warm and dry.  ?   Capillary Refill: Capillary refill takes less than 2 seconds.  ?Neurological:  ?   General: No focal deficit present.  ?   Mental Status: She is alert and oriented to person, place, and time.  ?Psychiatric:     ?   Mood and Affect: Mood is anxious. Affect is angry and tearful.     ?   Speech: Speech is rapid and pressured.     ?   Behavior: Behavior is  cooperative.     ?   Thought Content: Thought content normal.     ?   Cognition and Memory: Cognition normal.     ?   Judgment: Judgment normal.  ? ? ?ASSESSMENT & PLAN ?Problem List Items Addressed This Visit   ? ? Mixed hypercholesterolemia and hypertriglyceridemia  ?  Chronic. Currently on simvastatin. Continue current medications.  ?Recommend tight control in setting of hx of CVA, HTN, obesity, and elevated BG. ?Will obtain labs today.  ? ?  ?  ? Relevant Medications  ? lisinopril-hydrochlorothiazide (ZESTORETIC) 20-12.5 MG tablet  ? Other Relevant Orders  ? Hemoglobin A1c (Completed)  ? VITAMIN D 25 Hydroxy (Vit-D Deficiency, Fractures) (Completed)  ? TSH (Completed)  ? Lipid panel (Completed)  ? Comprehensive metabolic panel (Completed)  ? CBC with Differential/Platelet (Completed)  ? T4, free (Completed)  ? Essential hypertension  ?  Chronic. BP is elevated in the office today. Again, patient is very agitated and upset over her current situation with her marriage. At this time it is not clear if this elevation is transient while discussing and thinking of the issues or if this is something that is remaining present at all times.  ?Given the significant elevations seen on two occassions, I do feel that it is important to get better control to protect her from risks of another stroke or heart attack. Her stress levels are significant at this time and based on my interaction with her, this is likely remaining present the majority of the time.  ?I do feel that it is vital to her health to better control her anxiety and emotions. This is clearly creating increased blood pressure and likely other concerns internally.  ?Will increase zestoretic to 40-'25mg'$  daily to see if we can get better control.  ?I do recommend that she monitor her BP at home when calm and alone to see if the numbers improve. Goal BP less than 140/80. ? ?  ?  ? Relevant Medications  ? lisinopril-hydrochlorothiazide (ZESTORETIC) 20-12.5 MG tablet   ? Other Relevant Orders  ? Hemoglobin A1c (Completed)  ? VITAMIN D 25 Hydroxy (Vit-D Deficiency, Fractures) (Completed)  ? TSH (Completed)  ? Lipid panel (Completed)  ? Comprehensive metabolic panel (Completed)  ? CBC with Differential/Platelet (Completed)  ? T4, free (Completed)  ? Prediabetes  ?  Chronic. Has been taking semaglutide for management and previously working with [facility]. Her recent stressors have likely caused increase in her numbers. I am concerned with her history of CVA, HTN, HLD, and obesity that her risks for significant health complications are high.  ?Encourage following dietary changes to help control BG as well as reduction of stressors and daily physical  activity. We will obtain labs today and monitor.  ? ?  ?  ? Relevant Orders  ? Hemoglobin A1c (Completed)  ? VITAMIN D 25 Hydroxy (Vit-D Deficiency, Fractures) (Completed)  ? TSH (Completed)  ? Lipid panel (Completed)  ? Comprehensive metabolic panel (Completed)  ? CBC with Differential/Platelet (Completed)  ? T4, free (Completed)  ? Neuropathy  ?  Etiology unclear. She mentions this transiently at the end of the visit. It appears this is chronic given her history. Will obtain labs today for further evaluation. She may benefit from medication for management. Will follow-up once labs have been reviewed.  ? ?  ?  ? Relevant Orders  ? Hemoglobin A1c (Completed)  ? VITAMIN D 25 Hydroxy (Vit-D Deficiency, Fractures) (Completed)  ? TSH (Completed)  ? Lipid panel (Completed)  ? Comprehensive metabolic panel (Completed)  ? CBC with Differential/Platelet (Completed)  ? T4, free (Completed)  ? Body mass index (BMI) 40.0-44.9, adult (HCC)  ?  Chronic. Previously working with [employer]. At this time is continuing with medications, but stressors are preventing her from actively working on weight loss efforts. I do feel that increased exercise would benefit both her physical and mental health. I have encouraged the patient to take daily walks. I am concerned that  her elevation of stress is likely creating increased difficulty in her weight loss efforts. With her current co-morbid conditions, she is at an increased risk of serious complications.  ?Will continue with c

## 2021-05-05 LAB — TSH: TSH: 0.964 u[IU]/mL (ref 0.450–4.500)

## 2021-05-05 LAB — HEMOGLOBIN A1C
Est. average glucose Bld gHb Est-mCnc: 128 mg/dL
Hgb A1c MFr Bld: 6.1 % — ABNORMAL HIGH (ref 4.8–5.6)

## 2021-05-05 LAB — CBC WITH DIFFERENTIAL/PLATELET
Basophils Absolute: 0 10*3/uL (ref 0.0–0.2)
Basos: 1 %
EOS (ABSOLUTE): 0.1 10*3/uL (ref 0.0–0.4)
Eos: 1 %
Hematocrit: 37.7 % (ref 34.0–46.6)
Hemoglobin: 12.8 g/dL (ref 11.1–15.9)
Immature Grans (Abs): 0 10*3/uL (ref 0.0–0.1)
Immature Granulocytes: 0 %
Lymphocytes Absolute: 1.9 10*3/uL (ref 0.7–3.1)
Lymphs: 36 %
MCH: 30.6 pg (ref 26.6–33.0)
MCHC: 34 g/dL (ref 31.5–35.7)
MCV: 90 fL (ref 79–97)
Monocytes Absolute: 0.5 10*3/uL (ref 0.1–0.9)
Monocytes: 9 %
Neutrophils Absolute: 2.8 10*3/uL (ref 1.4–7.0)
Neutrophils: 53 %
Platelets: 282 10*3/uL (ref 150–450)
RBC: 4.18 x10E6/uL (ref 3.77–5.28)
RDW: 13.7 % (ref 11.7–15.4)
WBC: 5.3 10*3/uL (ref 3.4–10.8)

## 2021-05-05 LAB — LIPID PANEL
Chol/HDL Ratio: 2.7 ratio (ref 0.0–4.4)
Cholesterol, Total: 207 mg/dL — ABNORMAL HIGH (ref 100–199)
HDL: 77 mg/dL (ref >39)
LDL Chol Calc (NIH): 114 mg/dL — ABNORMAL HIGH (ref 0–99)
Triglycerides: 88 mg/dL (ref 0–149)
VLDL Cholesterol Cal: 16 mg/dL (ref 5–40)

## 2021-05-05 LAB — COMPREHENSIVE METABOLIC PANEL
ALT: 15 IU/L (ref 0–32)
AST: 15 IU/L (ref 0–40)
Albumin/Globulin Ratio: 2.6 — ABNORMAL HIGH (ref 1.2–2.2)
Albumin: 4.7 g/dL (ref 3.8–4.8)
Alkaline Phosphatase: 36 IU/L — ABNORMAL LOW (ref 44–121)
BUN/Creatinine Ratio: 27 (ref 12–28)
BUN: 24 mg/dL (ref 8–27)
Bilirubin Total: 0.5 mg/dL (ref 0.0–1.2)
CO2: 27 mmol/L (ref 20–29)
Calcium: 10.1 mg/dL (ref 8.7–10.3)
Chloride: 104 mmol/L (ref 96–106)
Creatinine, Ser: 0.9 mg/dL (ref 0.57–1.00)
Globulin, Total: 1.8 g/dL (ref 1.5–4.5)
Glucose: 96 mg/dL (ref 70–99)
Potassium: 5.2 mmol/L (ref 3.5–5.2)
Sodium: 142 mmol/L (ref 134–144)
Total Protein: 6.5 g/dL (ref 6.0–8.5)
eGFR: 71 mL/min/{1.73_m2} (ref >59)

## 2021-05-05 LAB — T4, FREE: Free T4: 1.34 ng/dL (ref 0.82–1.77)

## 2021-05-05 LAB — VITAMIN D 25 HYDROXY (VIT D DEFICIENCY, FRACTURES): Vit D, 25-Hydroxy: 36.6 ng/mL (ref 30.0–100.0)

## 2021-05-10 ENCOUNTER — Other Ambulatory Visit: Payer: Self-pay | Admitting: *Deleted

## 2021-05-10 DIAGNOSIS — E782 Mixed hyperlipidemia: Secondary | ICD-10-CM

## 2021-05-11 NOTE — Assessment & Plan Note (Signed)
In setting of elevated BG, HTN, HLD, family hx of CVD, and morbid obesity I have strong concerns for risk of recurrence. Her BP is not well controlled at this time. We will increase her medication today. I feel that her current emotional state is putting her at greater risk of serious CV event. She is quite distressed while in the office and her vital signs are correlating with that. From what she tells me she is feeling this way most of the time.  ?I recommend she very closely monitor her diet and BP to help reduce her risks, as well as continue to work on relaxation.  ?

## 2021-05-11 NOTE — Assessment & Plan Note (Signed)
Chronic. Previously working with [employer]. At this time is continuing with medications, but stressors are preventing her from actively working on weight loss efforts. I do feel that increased exercise would benefit both her physical and mental health. I have encouraged the patient to take daily walks. I am concerned that her elevation of stress is likely creating increased difficulty in her weight loss efforts. With her current co-morbid conditions, she is at an increased risk of serious complications.  ?Will continue with current efforts and recommend refocus on carbohydrate reduction to no more than 150 g a day with daily walking of at least 20 minutes.  ?

## 2021-05-11 NOTE — Assessment & Plan Note (Signed)
Chronic. BP is elevated in the office today. Again, patient is very agitated and upset over her current situation with her marriage. At this time it is not clear if this elevation is transient while discussing and thinking of the issues or if this is something that is remaining present at all times.  ?Given the significant elevations seen on two occassions, I do feel that it is important to get better control to protect her from risks of another stroke or heart attack. Her stress levels are significant at this time and based on my interaction with her, this is likely remaining present the majority of the time.  ?I do feel that it is vital to her health to better control her anxiety and emotions. This is clearly creating increased blood pressure and likely other concerns internally.  ?Will increase zestoretic to 40-'25mg'$  daily to see if we can get better control.  ?I do recommend that she monitor her BP at home when calm and alone to see if the numbers improve. Goal BP less than 140/80. ?

## 2021-05-11 NOTE — Assessment & Plan Note (Signed)
Chronic. Has been taking semaglutide for management and previously working with [facility]. Her recent stressors have likely caused increase in her numbers. I am concerned with her history of CVA, HTN, HLD, and obesity that her risks for significant health complications are high.  ?Encourage following dietary changes to help control BG as well as reduction of stressors and daily physical activity. We will obtain labs today and monitor.  ?

## 2021-05-11 NOTE — Assessment & Plan Note (Signed)
Chronic. Currently on simvastatin. Continue current medications.  ?Recommend tight control in setting of hx of CVA, HTN, obesity, and elevated BG. ?Will obtain labs today.  ?

## 2021-05-11 NOTE — Assessment & Plan Note (Signed)
Etiology unclear. She mentions this transiently at the end of the visit. It appears this is chronic given her history. Will obtain labs today for further evaluation. She may benefit from medication for management. Will follow-up once labs have been reviewed.  ?

## 2021-05-11 NOTE — Assessment & Plan Note (Signed)
Significant anxiety reaction related to her husbands recent relationship with former employee. She endorses doing well on her medication with control of her tearfulness and anxiety, but her emotions and responses present show that she is still quite distressed over the situation. At this time she appears more angry than sad. I have encouraged her to consider whether she would like to continue the current relationship if it is causing this much distress. I do feel that time apart would allow her to work through her emotions without having to also manage the tense situations sharing a home and work place with her husband. She tells me that she wants her marriage to work, but also tells me she is not sure if she can forgive him. Her representation of their communications reveals that she feels he is untruthful to her and that she cannot trust him. I have encouraged her to look deeply to determine if this is something that she is willing to compromise and live with. I do not feel the current circumstances are beneficial to her physical or mental health. Discussed with the patient that time apart to sort through her emotions and feeling may help ease her anxiety and stressors. At this time she wishes to remain in the same home. I have encouraged her to continue with counseling independently as she feels this is helpful.  ?She declines increase in her medications today. She is aware that if she feels that her symptoms are not well controlled, we can consider an increase or augmentation of medication to better help with control.  ?

## 2021-05-11 NOTE — Assessment & Plan Note (Signed)
Improvement on depressive symptoms associated with recent relationship conflicts with her husband.  She is currently in counseling and reportedly doing well with this at this time I do feel that continuation of counseling is vital to help in the recovery process.  Her emotions remain high with fluctuations from tearfulness to anger to sadness during the interview today.  At this time she denies any SI/HI and I do not feel that there is an increased risk of this presently.  Discussed with patient the importance of continuation of counseling services.  At this time she declines the option to increase her medications as she feels the dose is appropriate for her.  We did discuss the option that if she feels that she needs additional control in the future that we can always consider increasing the medication or augmenting the dose.  I do feel that her overall emotional and physical health would be benefited if she were able to remove herself from the current situation and have time to recover independently however she reports that at this time she would like to stay in the home.  Discussed with patient that a break away from the relationship and stressors would not necessarily mean that reuniting in the future could not take place and encouraged her to consider this for her own health. ?At this time we will continue to monitor and encourage patient to continue to work through her emotions and feelings and reach out if she has any new or worsening symptoms. ?

## 2021-05-11 NOTE — Assessment & Plan Note (Addendum)
>>  ASSESSMENT AND PLAN FOR ADJUSTMENT DISORDER WITH MIXED ANXIETY AND DEPRESSED MOOD WRITTEN ON 07/15/2022 10:04 PM BY Aeron Donaghey E, NP  >>ASSESSMENT AND PLAN FOR DEPRESSION, MAJOR, SINGLE EPISODE, SEVERE (HCC) WRITTEN ON 05/11/2021  4:58 PM BY Maikol Grassia E, NP  Improvement on depressive symptoms associated with recent relationship conflicts with her husband.  She is currently in counseling and reportedly doing well with this at this time I do feel that continuation of counseling is vital to help in the recovery process.  Her emotions remain high with fluctuations from tearfulness to anger to sadness during the interview today.  At this time she denies any SI/HI and I do not feel that there is an increased risk of this presently.  Discussed with patient the importance of continuation of counseling services.  At this time she declines the option to increase her medications as she feels the dose is appropriate for her.  We did discuss the option that if she feels that she needs additional control in the future that we can always consider increasing the medication or augmenting the dose.  I do feel that her overall emotional and physical health would be benefited if she were able to remove herself from the current situation and have time to recover independently however she reports that at this time she would like to stay in the home.  Discussed with patient that a break away from the relationship and stressors would not necessarily mean that reuniting in the future could not take place and encouraged her to consider this for her own health. At this time we will continue to monitor and encourage patient to continue to work through her emotions and feelings and reach out if she has any new or worsening symptoms.  >>ASSESSMENT AND PLAN FOR OTHER SPECIFIED ANXIETY DISORDERS WRITTEN ON 05/11/2021  4:52 PM BY Safaa Stingley E, NP  Significant anxiety reaction related to her husbands recent relationship with former  employee. She endorses doing well on her medication with control of her tearfulness and anxiety, but her emotions and responses present show that she is still quite distressed over the situation. At this time she appears more angry than sad. I have encouraged her to consider whether she would like to continue the current relationship if it is causing this much distress. I do feel that time apart would allow her to work through her emotions without having to also manage the tense situations sharing a home and work place with her husband. She tells me that she wants her marriage to work, but also tells me she is not sure if she can forgive him. Her representation of their communications reveals that she feels he is untruthful to her and that she cannot trust him. I have encouraged her to look deeply to determine if this is something that she is willing to compromise and live with. I do not feel the current circumstances are beneficial to her physical or mental health. Discussed with the patient that time apart to sort through her emotions and feeling may help ease her anxiety and stressors. At this time she wishes to remain in the same home. I have encouraged her to continue with counseling independently as she feels this is helpful.  She declines increase in her medications today. She is aware that if she feels that her symptoms are not well controlled, we can consider an increase or augmentation of medication to better help with control.

## 2021-06-06 ENCOUNTER — Other Ambulatory Visit: Payer: Self-pay | Admitting: Cardiovascular Disease

## 2021-06-06 DIAGNOSIS — I10 Essential (primary) hypertension: Secondary | ICD-10-CM

## 2021-06-07 ENCOUNTER — Encounter: Payer: Self-pay | Admitting: Neurology

## 2021-06-07 ENCOUNTER — Ambulatory Visit (INDEPENDENT_AMBULATORY_CARE_PROVIDER_SITE_OTHER): Payer: Medicare Other | Admitting: Neurology

## 2021-06-07 VITALS — BP 154/68 | HR 59 | Ht 64.0 in | Wt 233.0 lb

## 2021-06-07 DIAGNOSIS — G3184 Mild cognitive impairment, so stated: Secondary | ICD-10-CM | POA: Diagnosis not present

## 2021-06-07 DIAGNOSIS — Z9189 Other specified personal risk factors, not elsewhere classified: Secondary | ICD-10-CM | POA: Diagnosis not present

## 2021-06-07 DIAGNOSIS — I6990 Unspecified sequelae of unspecified cerebrovascular disease: Secondary | ICD-10-CM | POA: Diagnosis not present

## 2021-06-07 DIAGNOSIS — E669 Obesity, unspecified: Secondary | ICD-10-CM | POA: Diagnosis not present

## 2021-06-07 DIAGNOSIS — I63332 Cerebral infarction due to thrombosis of left posterior cerebral artery: Secondary | ICD-10-CM

## 2021-06-07 MED ORDER — REPATHA SURECLICK 140 MG/ML ~~LOC~~ SOAJ: 140.0000 mg | SUBCUTANEOUS | 3 refills | Status: DC

## 2021-06-07 NOTE — Patient Instructions (Signed)
I had a long discussion with the patient and her husband regarding her recent stroke and recommend she continue on aspirin for stroke prevention and maintain aggressive risk factor modification with strict control of hypertension with blood pressure goal below 130/90, lipids with LDL cholesterol goal below 70 mg percent and diabetes with hemoglobin A1c goal below 6.5%.  She was also encouraged to eat a healthy diet with lots of fruits, vegetables, cereals, whole grains and to be active and exercise regularly and lose weight.  Patient has history of statin intolerance and Zetia alone is not controlling her lipids and so recommend she try Repatha injections if approved by insurance.  She also appears to be at risk for sleep apnea and I recommend checking polysomnogram for sleep apnea.  We also discussed memory compensation strategies and encouraged her to increase participation in mentally changing activities like solving crossword puzzles, playing bridge and sudoku.  She will return for follow-up in the future in 6 months or call earlier if necessary. Memory Compensation Strategies  Use "WARM" strategy.  W= write it down  A= associate it  R= repeat it  M= make a mental note  2.   You can keep a Social worker.  Use a 3-ring notebook with sections for the following: calendar, important names and phone numbers,  medications, doctors' names/phone numbers, lists/reminders, and a section to journal what you did  each day.   3.    Use a calendar to write appointments down.  4.    Write yourself a schedule for the day.  This can be placed on the calendar or in a separate section of the Memory Notebook.  Keeping a  regular schedule can help memory.  5.    Use medication organizer with sections for each day or morning/evening pills.  You may need help loading it  6.    Keep a basket, or pegboard by the door.  Place items that you need to take out with you in the basket or on the pegboard.  You may also  want to  include a message board for reminders.  7.    Use sticky notes.  Place sticky notes with reminders in a place where the task is performed.  For example: " turn off the  stove" placed by the stove, "lock the door" placed on the door at eye level, " take your medications" on  the bathroom mirror or by the place where you normally take your medications.  8.    Use alarms/timers.  Use while cooking to remind yourself to check on food or as a reminder to take your medicine, or as a  reminder to make a call, or as a reminder to perform another task, etc.

## 2021-06-07 NOTE — Progress Notes (Signed)
[facility] [address] [place]. [place] [address] [phone]       OFFICE CONSULT NOTE  [name] Date of Birth:  October 12, 1955 Medical Record Number:  [id number]   Referring MD: Kary Kos  Reason for Referral: Stroke  HPI: Lauren Briggs is a 66 year old pleasant Caucasian lady seen today for initial office consultation visit.  History is obtained from the patient and her husband is accompanying her and review of referral notes.  I personally reviewed available imaging films in PACS.  Patient states she is started having some memory difficulties after her sixth back surgery in December 22, 2019.  She felt disoriented confused and trouble remembering information for surgery.  She also had some trouble with the right-sided vision.  Over the next several weeks symptoms gradually improved she still does not feel her memory is back to baseline.  She still has to remember information and think.  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  MR angiogram of the brain on 09/28/2020 had shown left PCA occlusion as well as moderate atherosclerotic changes in various intracranial vessels.  Lab work on 09/01/2020 had shown LDL cholesterol '1 1 2 '$ mg percent triglycerides of 201 mg percent.  Patient has been started on aspirin 325 mg daily since then and is also on Zetia 10 mg and Zocor 20 mg.  She still has some occasional naming and word finding difficulties but these seem to be improving and they are not progressive.  She has no prior history of strokes, TIAs or significant neurological issues except chronic back pain for which she is undergone multiple back surgeries.  She denies history of depression, significant prior head injury with loss of consciousness.  No family history of memory loss or dementia.  On Mini-Mental status exam testing today she scored 28/30 regarding  depression scale she is not depressed.  Clock drawing 4/4. Update 06/07/2021 :  She returns for follow-up after last visit 5 months ago.  She states some memory difficulties are about the same and unchanged.  She has not been participating much in doing mentally challenging activities.  She has not noticed any worsening of her cognition and memory or ability to care for herself.  She remains fully independent in all activities of daily living.  She has had no recurrent stroke or TIA symptoms.  She remains on aspirin 81 mg daily which is tolerating well without any side effects like bruising or bleeding.  She has history of intolerance to statins and has tried Lipitor and Zocor in the past.  Lipid profile on 05/04/2021 showed LDL yet to be elevated at '1 1 4 '$ mg percent.  Hemoglobin A1c of 6.4.  Her husband states that she does snore.  Patient has never been evaluated for sleep apnea but appears to be at risk and is willing to undergo testing.  On Mini-Mental status exam today she scored 28/30 which is unchanged from last visit.  Clock drawing score of 4/4 and she was able to name 13 animals which can walk on 4 legs. 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   Neuromuscular disorder ([place])    Pre-diabetes    Prediabetes 11/10/2018   Shortness of breath dyspnea     Social History:  Social History   Socioeconomic History   Marital status: Married    Spouse name: Lauren Briggs   Number of children: Not on file   Years of education: Not on file   Highest education level: Not on file   Occupational History   Occupation: retired Radio broadcast assistant  Tobacco Use   Smoking status: Former    Years: 3.00    Types: Cigarettes    Quit date: 1981    Years since quitting: 42.4   Smokeless tobacco: Former    Quit date: 01/22/1979  Vaping Use   Vaping Use: Never used  Substance and Sexual Activity   Alcohol use: Yes    Comment: occasional bourbon and ginger ale    Drug use: No   Sexual activity: Not on file  Other Topics Concern   Not on file  Social History Narrative   Not on file   Social Determinants of Health   Financial Resource Strain: Not on file  Food Insecurity: Not on file  Transportation Needs: Not on file  Physical Activity: Not on file  Stress: Not on file  Social Connections: Not on file  Intimate Partner Violence: Not on file    Medications:   Current Outpatient Medications on File Prior to Visit  Medication Sig Dispense Refill   Coenzyme Q10 (COQ-10) 100 MG CAPS Take 100 mg by mouth in the morning and at bedtime. otc 30 capsule 0   escitalopram (LEXAPRO) 10 MG tablet Take 1 tablet (10 mg total) by mouth at bedtime. 90 tablet 3   estradiol (ESTRACE) 1 MG tablet Take 1 mg by mouth every morning.      ezetimibe (ZETIA) 10 MG tablet TAKE 1 TABLET(10 MG) BY MOUTH DAILY 90 tablet 3   hydrOXYzine (VISTARIL) 25 MG capsule Take 1 capsule (25 mg total) by mouth 3 (three) times daily as needed for anxiety. 30 capsule 0   lisinopril-hydrochlorothiazide (ZESTORETIC) 20-12.5 MG tablet Take 1 tablet by mouth daily. PATIENT MUST SCHEDULE ANNUAL APPOINTMENT FOR FUTURE REFILLS 90 tablet 0   meloxicam (MOBIC) 15 MG tablet Take 1 tablet (15 mg total) by mouth daily. 30 tablet 1   Multiple Vitamin (MULTIVITAMIN WITH MINERALS) TABS Take 1 tablet by mouth daily after supper. Women's 50 plus     propranolol (INDERAL) 20 MG tablet Take 1 tablet (20 mg total) by mouth 3 (three) times daily as needed. 90 tablet 2   traZODone (DESYREL) 50 MG tablet Take 1 tablet (50 mg total) by mouth  at bedtime. 90 tablet 3   Semaglutide, 1 MG/DOSE, (OZEMPIC, 1 MG/DOSE,) 4 MG/3ML SOPN Inject 1 mg into the skin once a week. (Patient not taking: Reported on 06/07/2021) 9 mL 0   No current facility-administered medications on file prior to visit.    Allergies:   Allergies  Allergen Reactions   Versed [Midazolam] Other (See Comments)    Stroke   Iodine Nausea And Vomiting    During a myelogram    Lipitor [Atorvastatin] Other (See Comments)    headaches   Zocor [Simvastatin] Other (See Comments)    Reports leg cramps w/ doses over '20mg'$  daily.   Propoxyphene Hcl Palpitations  darvon     Physical Exam General: well developed, well nourished, pleasant middle-aged Caucasian lady seated, in no evident distress Head: head normocephalic and atraumatic.   Neck: supple with no carotid or supraclavicular bruits Cardiovascular: regular rate and rhythm, no murmurs Musculoskeletal: no deformity Skin:  no rash/petichiae Vascular:  Normal pulses all extremities  Neurologic Exam Mental Status: Awake and fully alert. Oriented to place and time. Recent and remote memory intact. Attention span, concentration and fund of knowledge appropriate. Mood and affect appropriate.  Mini-Mental status exam score 28/30 with deficits in recall.  Geriatric depression scale not done.  Clock drawing 4/4.  Able to name 13 animals which can walk on 4 legs. Cranial Nerves: Fundoscopic exam not done. Pupils equal, briskly reactive to light. Extraocular movements full without nystagmus. Visual fields full to confrontation. Hearing intact. Facial sensation intact. Face, tongue, palate moves normally and symmetrically.  Motor: Normal bulk and tone. Normal strength in all tested extremity muscles. Sensory.: intact to touch , pinprick , position and vibratory sensation.  Coordination: Rapid alternating movements normal in all extremities. Finger-to-nose and heel-to-shin performed accurately bilaterally. Gait and Station:  Arises from chair without difficulty. Stance is normal. Gait demonstrates normal stride length and balance . Able to heel, toe and tandem walk with great difficulty.  Reflexes: 1+ and symmetric. Toes downgoing.        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  Abnormal MRI also showing remote age right basal ganglia infarct and MRA showing diffuse intracranial atherosclerotic changes.  Vascular risk factors of hyperlipidemia, hypertension, obesity and intracranial atherosclerosis.  She also has poststroke mild cognitive impairment which appears stable.     PLAN:I had a long discussion with the patient and her husband regarding her recent stroke and recommend she continue on aspirin for stroke prevention and maintain aggressive risk factor modification with strict control of hypertension with blood pressure goal below 130/90, lipids with LDL cholesterol goal below 70 mg percent and diabetes with hemoglobin A1c goal below 6.5%.  She was also encouraged to eat a healthy diet with lots of fruits, vegetables, cereals, whole grains and to be active and exercise regularly and lose weight.  Patient has history of statin intolerance and Zetia alone is not controlling her lipids and so recommend she try Repatha injections if approved by insurance.  She also appears to be at risk for sleep apnea and I recommend checking polysomnogram for sleep apnea.  We also discussed memory  compensation strategies and encouraged her to increase participation in mentally changing activities like solving crossword puzzles, playing bridge and sudoku.  She will return for follow-up in the future in 6 months or call earlier if necessary. Greater than 50% time during this prolonged 45-minute  visit was spent in counseling and coordination of care about her remote stroke and memory loss and answering questions. Lauren Briggs  Leonie Man, MD Note: This document was prepared with digital dictation and possible smart phrase technology. Any transcriptional errors that result from this process are unintentional.

## 2021-06-09 IMAGING — MR MR LUMBAR SPINE WO/W CM
4 of 8 series · 26 of 48 positions shown · IV contrast (Multihance)
Comparison: Radiography 10/26/2019. CT 02/10/2019. MRI 04/18/2012.

CLINICAL DATA: Low back pain, worsening over the last 3 months.
Right leg numbness.

EXAM:
MRI LUMBAR SPINE WITHOUT AND WITH CONTRAST
TECHNIQUE: Multiplanar and multiecho pulse sequences of the lumbar spine were
obtained without and with intravenous contrast.
CONTRAST:  20mL MULTIHANCE GADOBENATE DIMEGLUMINE 529 MG/ML IV SOLN

[Series 3: T2 post-contrast · sagittal · 4.0mm · 0.53mm/px · 5 of 16 slices shown]
[im 1/16]
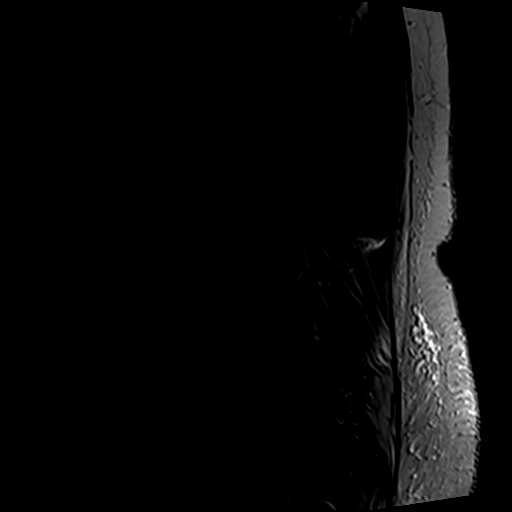
[im 4/16]
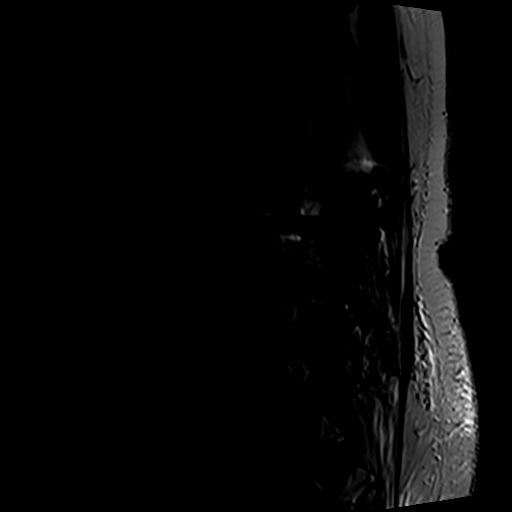
[im 8/16]
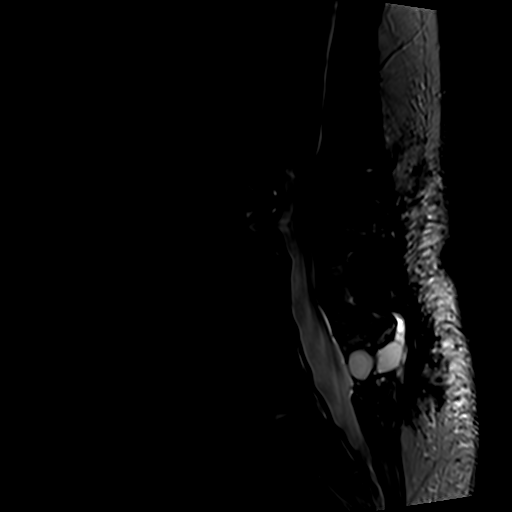
[im 12/16]
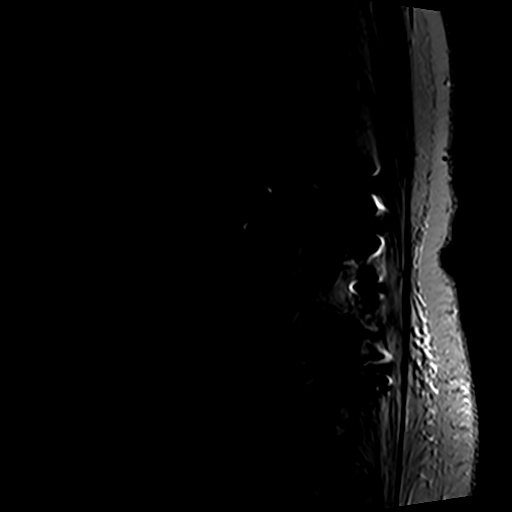
[im 16/16]
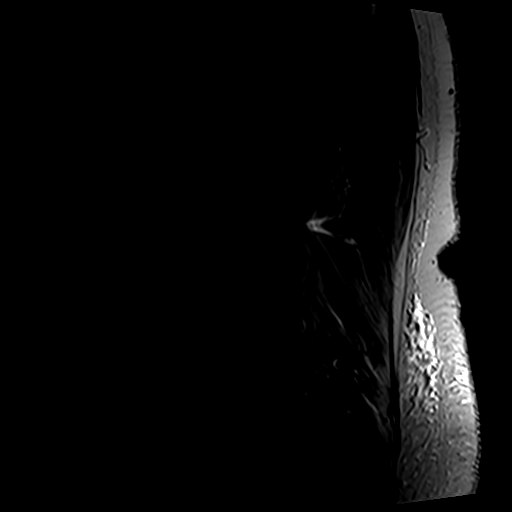

[Series 5: T1 · sagittal · 4.0mm · 0.53mm/px · 4 of 16 slices shown (1 of 2)]
[im 1/16]
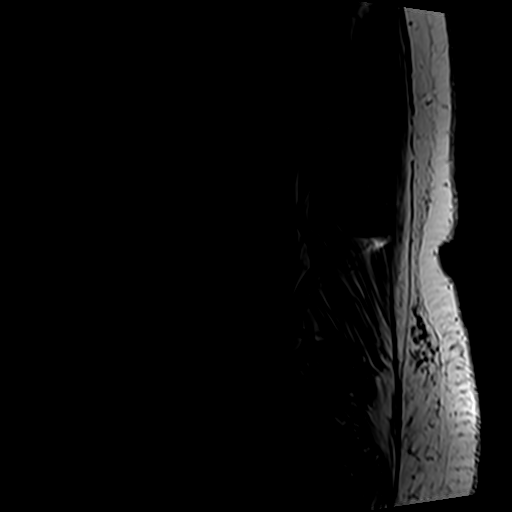
[im 6/16]
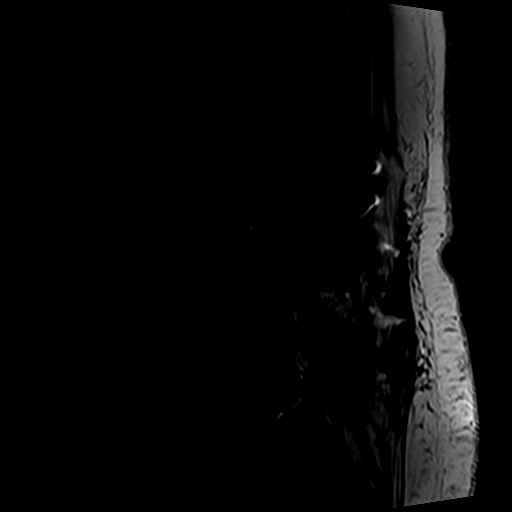
[im 11/16]
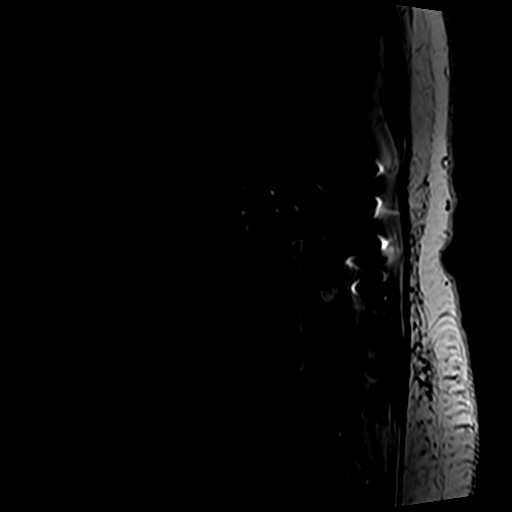
[im 16/16]
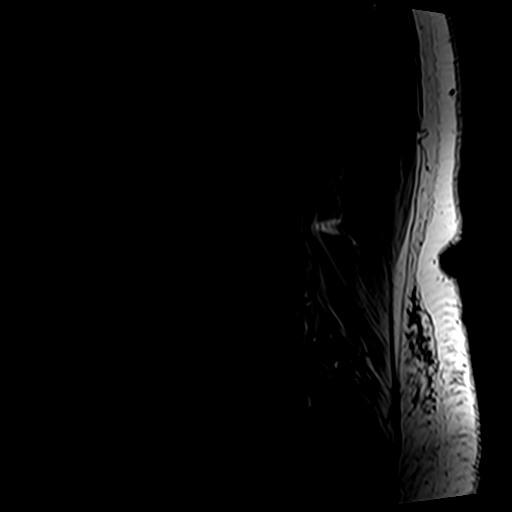

[Series 6: T2 · axial · 4.0mm · 0.70mm/px · z∈[-65,+139]mm · 9 of 37 slices shown]
[im 1/37]
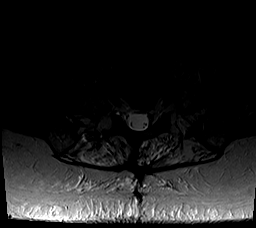
[im 5/37]
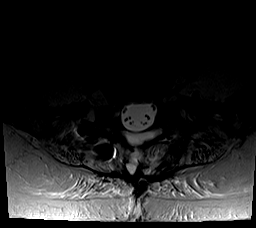
[im 10/37]
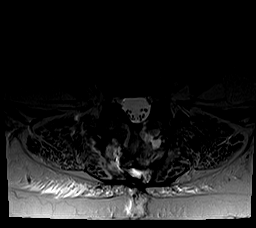
[im 14/37]
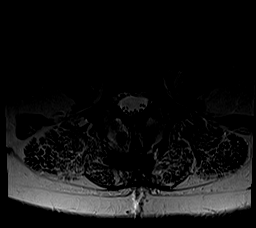
[im 19/37]
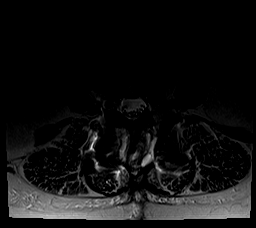
[im 23/37]
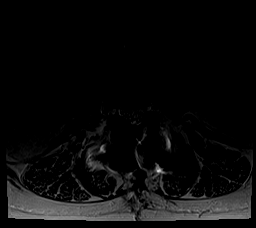
[im 28/37]
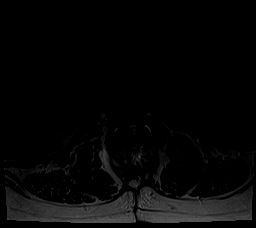
[im 32/37]
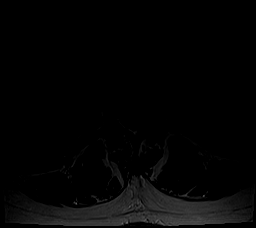
[im 37/37]
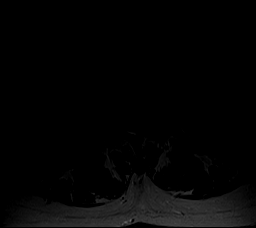

[Series 7: T1 · axial · 4.0mm · 0.35mm/px · z∈[-65,+113]mm · 8 of 37 slices shown (2 of 2)]
[im 1/37]
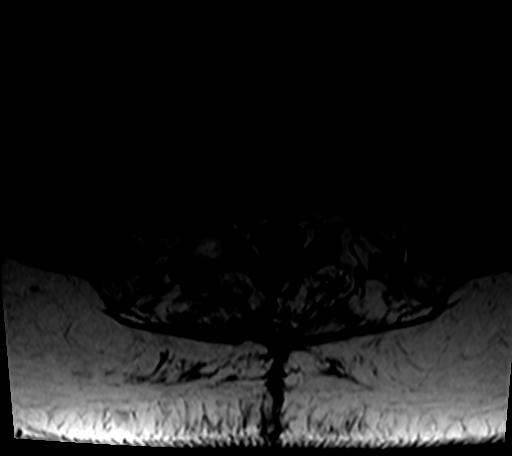
[im 5/37]
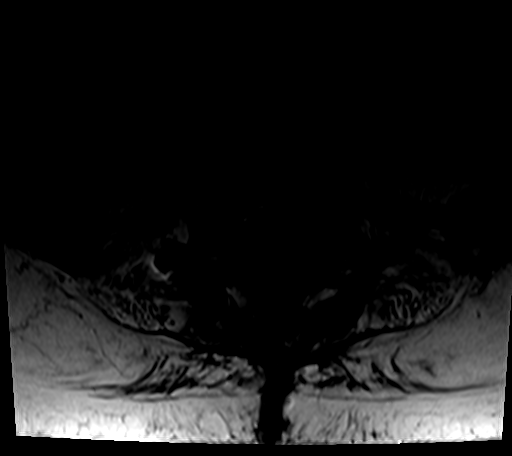
[im 10/37]
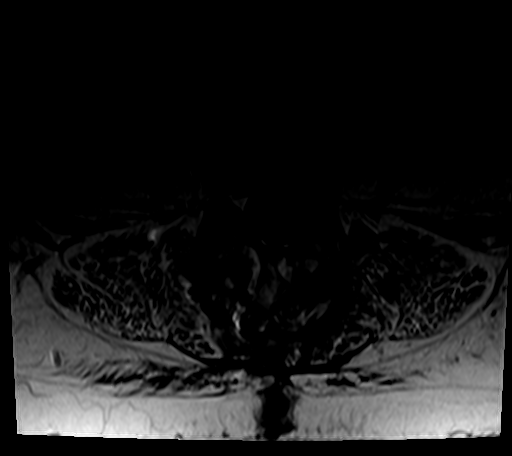
[im 14/37]
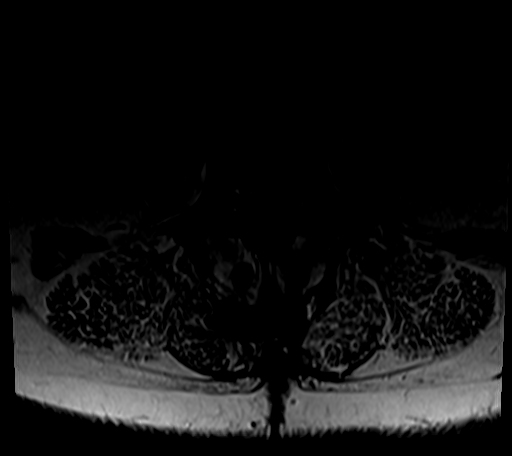
[im 19/37]
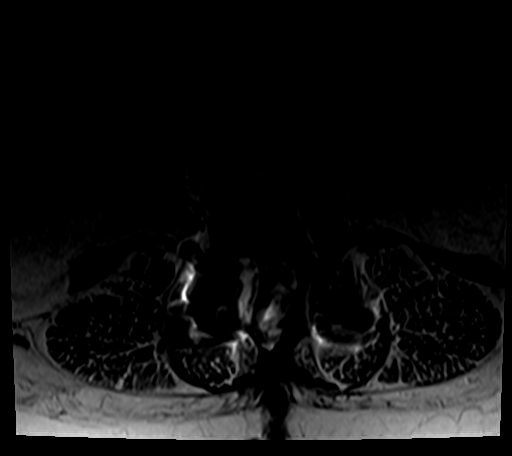
[im 23/37]
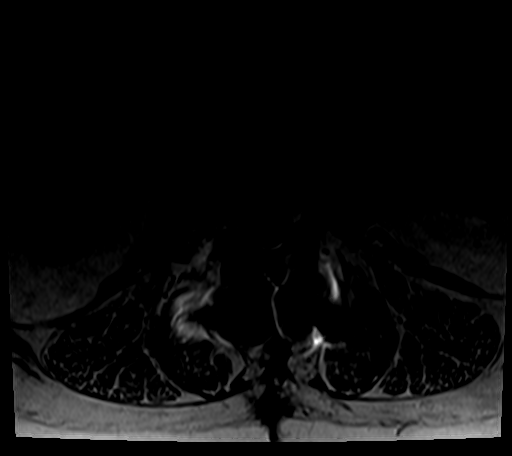
[im 28/37]
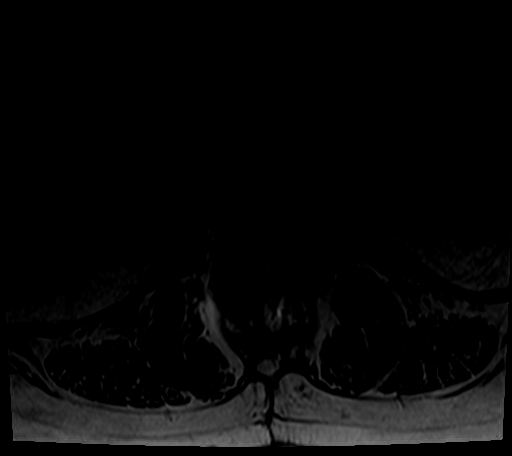
[im 32/37]
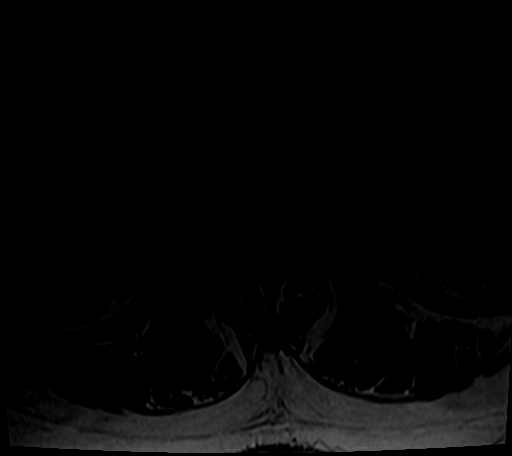

[26 of 48 positions shown; findings below may reference images not displayed]

FINDINGS: Segmentation:  5 lumbar type vertebral bodies.

Alignment:  2 mm retrolisthesis L1-2.

Vertebrae: No fracture or primary bone lesion. Some discogenic
endplate edema L1-2 which could be associated with regional pain.

Conus medullaris and cauda equina: Conus extends to the L1-2 level.
Conus and cauda equina appear normal.

Paraspinal and other soft tissues: Negative

Disc levels:

T11-12 and T12-L1: Normal

L1-2: Retrolisthesis 2 mm. Endplate osteophytes and shallow
protrusion of disc material. Some slight indentation of the thecal
sac but no apparent compressive canal stenosis. Bilateral facet
osteoarthritis. Mild bilateral foraminal narrowing. Discogenic
endplate edema as noted above which could be painful.

L2-3: Previous posterior decompression, diskectomy and fusion. No
complicating feature. Wide patency of the canal and foramina.

L3-4: Previous lateral discectomy and fusion. Inter spinous device.
Sufficient patency of the canal and foramina.

L4-5: Previous lateral discectomy and fusion. No complicating
feature. Sufficient patency of the canal and foramina.

L5-S1: Previous posterior decompression, diskectomy and fusion. Wide
patency of the canal and foramina. Chronic fluid collections
posterior to the spinal canal could be chronic seromas or contained
pseudomeningocele.
IMPRESSION: 1. Previous posterior decompression, diskectomy and fusion
procedures from L2 to the S1 level. Wide patency of the canal and
foramina.
2. Worsening of adjacent segment degenerative disease at L1-2. 2 mm
retrolisthesis. Endplate osteophytes and shallow protrusion of disc
material. Bilateral facet osteoarthritis. Mild bilateral foraminal
narrowing. Discogenic endplate edema which could be painful.

## 2021-06-11 ENCOUNTER — Telehealth: Payer: Self-pay | Admitting: *Deleted

## 2021-06-11 NOTE — Telephone Encounter (Signed)
I called [facility], spoke with Sunday Spillers. I updated her on the PA approval for repatha and they will contact patient to discuss delivery.

## 2021-06-11 NOTE — Telephone Encounter (Signed)
PA for Repatha '140mg'$  started on covermymeds (key: [id number]). Pharmacy coverage through [place] [phone]). Case: [id number] approved through 01/20/22.

## 2021-06-11 NOTE — Telephone Encounter (Signed)
[facility] left a voicemail at 8:30 AM asking for a call to discuss the [place] prior authorization coverage.  Please refer to prescription [phone].  Please call [facility] back at [phone].

## 2021-06-27 ENCOUNTER — Ambulatory Visit (INDEPENDENT_AMBULATORY_CARE_PROVIDER_SITE_OTHER): Payer: Medicare Other | Admitting: Podiatry

## 2021-06-27 DIAGNOSIS — M778 Other enthesopathies, not elsewhere classified: Secondary | ICD-10-CM

## 2021-06-27 MED ORDER — MELOXICAM 15 MG PO TABS: 15.0000 mg | ORAL_TABLET | Freq: Every day | ORAL | 1 refills | Status: DC

## 2021-06-27 MED ORDER — METHYLPREDNISOLONE 4 MG PO TBPK: ORAL_TABLET | ORAL | 0 refills | Status: DC

## 2021-06-27 NOTE — Progress Notes (Signed)
HPI: 66 y.o. female presenting today for follow-up evaluation of left midfoot pain.  Patient states that she has chronically had left midfoot pain however it has become increasingly exacerbated over the past few months.  Injections only helped temporarily.  She is very concerned and states that she has excruciating pain to the left midfoot and it is affecting her daily activities of living and quality of life.  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   Neuromuscular disorder ([place])    Pre-diabetes    Prediabetes 11/10/2018   Shortness of breath dyspnea     Past Surgical History:  Procedure Laterality Date   ABDOMINAL HYSTERECTOMY     PARTIAL   ANTERIOR LAT LUMBAR FUSION N/A 08/14/2018   Procedure: extreme lateral interbody fusion - Lumbar two-Lumbar three  cortical screws and removal pedicle screws Lumbar -Sacral one;  Surgeon: Kary Kos, MD;  Location: [facility];  Service: Neurosurgery;  Laterality: N/A;   APPENDECTOMY     BACK SURGERY     times 4   CARPAL TUNNEL RELEASE     BIL    CESAREAN SECTION     PARTIAL HYST   CLOSED REDUCTION ULNAR SHAFT     COLONOSCOPY     FOOT SURGERY     LT FOOT CYST REMOVED    HARDWARE REMOVAL N/A 08/14/2018   Procedure: Removal pedicle screws Lumbar -Sacral one with cortical screw fixation lumbar two-three;  Surgeon: Kary Kos, MD;  Location: [facility];  Service: Neurosurgery;  Laterality: N/A;   NASAL SINUS SURGERY     1994    radial head Right 01/14/2016   at [place] Right 01/26/2016   Procedure: RIGHT RADIAL HEAD REPLACEMENT ARTHROPLASTY WITH LIGAMENT REPAIRS;  Surgeon: Charlotte Crumb, MD;  Location: [facility];   Service: Orthopedics;  Laterality: Right;  Right elbow raial head replacement with possible ligament repairs as needed    UPPER GASTROINTESTINAL ENDOSCOPY      Allergies  Allergen Reactions   Versed [Midazolam] Other (See Comments)    Stroke   Iodine Nausea And Vomiting    During a myelogram    Lipitor [Atorvastatin] Other (See Comments)    headaches   Zocor [Simvastatin] Other (See Comments)    Reports leg cramps w/ doses over '20mg'$  daily.   Propoxyphene Hcl Palpitations    darvon      Physical Exam: General: The patient is alert and oriented x3 in no acute distress.  Dermatology: Skin is warm, dry and supple bilateral lower extremities. Negative for open lesions or macerations.  Vascular: Palpable pedal pulses bilaterally. Capillary refill within normal limits.  Negative for any significant edema or erythema  Neurological: Light touch and protective threshold grossly intact  Musculoskeletal Exam: No pedal deformities noted.  Significant pain on palpation throughout the midtarsal joint left foot  Radiographic Exam 03/20/2021:  Degenerative changes noted throughout the midtarsal joint of the left foot as well as the TMT joints.  No acute acute fractures noted.  Periarticular spurring noted.  Assessment: 1.  DJD/capsulitis left midfoot   Plan of Care:  1. Patient evaluated.  2.  Unfortunately  the patient has had the pain throughout the midfoot for several years.  She does have a history of surgery to the midfoot.  The steroid injection she received last visit only helped for a few days.  The pain is now severe and affecting her daily quality of life despite conservative treatment options. 3.  Today we are going to order MRI left foot wo contrast 4.  Prescription for Medrol Dosepak.  Prescription for meloxicam 15 mg daily after completion of the Dosepak.   5.  Return to clinic after MRI to review results and discuss further treatment options which may include surgery  *Used to  be a paralegal.  I also treat her husband, who is a Engineer, maintenance (IT), Martyn Ehrich, DPM [facility]  Dr. Edrick Kins, DPM    [address], [place] [address]                Office [phone]  Fax [phone]

## 2021-07-06 ENCOUNTER — Ambulatory Visit
Admission: RE | Admit: 2021-07-06 | Discharge: 2021-07-06 | Disposition: A | Discharge status: Home / Self Care | Payer: Medicare Other | Source: Ambulatory Visit | Attending: Podiatry | Admitting: Podiatry

## 2021-07-06 DIAGNOSIS — M778 Other enthesopathies, not elsewhere classified: Secondary | ICD-10-CM

## 2021-07-18 ENCOUNTER — Other Ambulatory Visit: Payer: Self-pay | Admitting: Internal Medicine

## 2021-07-18 DIAGNOSIS — E782 Mixed hyperlipidemia: Secondary | ICD-10-CM

## 2021-07-30 ENCOUNTER — Ambulatory Visit (INDEPENDENT_AMBULATORY_CARE_PROVIDER_SITE_OTHER): Payer: Medicare Other | Admitting: Podiatry

## 2021-07-30 DIAGNOSIS — M778 Other enthesopathies, not elsewhere classified: Secondary | ICD-10-CM | POA: Diagnosis not present

## 2021-07-30 NOTE — Progress Notes (Signed)
HPI: 66 y.o. female presenting today for follow-up evaluation of left midfoot pain.  Last visit MRI was ordered.  Patient presents to review the MRI results.  She continues to have pain and tenderness to the foot.  No new complaints at this time  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   Neuromuscular disorder ([place])    Pre-diabetes    Prediabetes 11/10/2018   Shortness of breath dyspnea     Past Surgical History:  Procedure Laterality Date   ABDOMINAL HYSTERECTOMY     PARTIAL   ANTERIOR LAT LUMBAR FUSION N/A 08/14/2018   Procedure: extreme lateral interbody fusion - Lumbar two-Lumbar three  cortical screws and removal pedicle screws Lumbar -Sacral one;  Surgeon: Kary Kos, MD;  Location: [facility];  Service: Neurosurgery;  Laterality: N/A;   APPENDECTOMY     BACK SURGERY     times 4   CARPAL TUNNEL RELEASE     BIL    CESAREAN SECTION     PARTIAL HYST   CLOSED REDUCTION ULNAR SHAFT     COLONOSCOPY     FOOT SURGERY     LT FOOT CYST REMOVED    HARDWARE REMOVAL N/A 08/14/2018   Procedure: Removal pedicle screws Lumbar -Sacral one with cortical screw fixation lumbar two-three;  Surgeon: Kary Kos, MD;  Location: [facility];  Service: Neurosurgery;  Laterality: N/A;   NASAL SINUS SURGERY     1994    radial head Right 01/14/2016   at [place] Right 01/26/2016   Procedure: RIGHT RADIAL HEAD REPLACEMENT ARTHROPLASTY WITH LIGAMENT REPAIRS;  Surgeon: Charlotte Crumb, MD;  Location: [facility];  Service: Orthopedics;  Laterality: Right;  Right elbow raial head replacement with possible ligament repairs as needed    UPPER GASTROINTESTINAL ENDOSCOPY      Allergies  Allergen Reactions   Versed [Midazolam] Other  (See Comments)    Stroke   Iodine Nausea And Vomiting    During a myelogram    Lipitor [Atorvastatin] Other (See Comments)    headaches   Zocor [Simvastatin] Other (See Comments)    Reports leg cramps w/ doses over '20mg'$  daily.   Propoxyphene Hcl Palpitations    darvon      Physical Exam: General: The patient is alert and oriented x3 in no acute distress.  Dermatology: Skin is warm, dry and supple bilateral lower extremities. Negative for open lesions or macerations.  Vascular: Palpable pedal pulses bilaterally. Capillary refill within normal limits.  Negative for any significant edema or erythema  Neurological: Light touch and protective threshold grossly intact  Musculoskeletal Exam: No pedal deformities noted.  Significant pain on palpation throughout the midtarsal joint left foot  Radiographic Exam 03/20/2021:  Degenerative changes noted throughout the midtarsal joint of the left foot as well as the TMT joints.  No acute acute fractures noted.  Periarticular spurring noted.  MR FOOT LEFT WO CONTRAST 07/06/2021 IMPRESSION: 1. 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 2. 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 3. Mild osteoarthritis of the third tarsometatarsal joint.  Assessment: 1.  DJD/capsulitis left midfoot   Plan of  Care:  1. Patient evaluated.  2.  MRI results reviewed today 3.  Patient continues to have severe pain and tenderness specifically to the second and third TMT joints of the left foot.  Patient has failed conservative treatment including oral anti-inflammatories, steroid injections, shoe gear modifications.  I do believe it is appropriate for surgery at this time 4.  Surgery was discussed in detail with the patient.  She agrees that she would like to pursue surgery.  Risks, benefits, advantages, and disadvantages explained to the patient.  All  patient questions were answered.  No guarantees were expressed or implied 5.  Authorization for surgery was initiated today.  Surgery will consist of second and third tarsometatarsal arthrodesis left foot 6.  Return to clinic 1 week postop  *Used to be a Radio broadcast assistant.  I also treat her husband, who is a Engineer, maintenance (IT), Hillard Danker *Going to [employer], [place] today for vacation     Edrick Kins, DPM [facility]  Dr. Edrick Kins, DPM    [address], [place] [address]                Office [phone]  Fax [phone]

## 2021-08-03 ENCOUNTER — Encounter (HOSPITAL_BASED_OUTPATIENT_CLINIC_OR_DEPARTMENT_OTHER): Payer: Self-pay | Admitting: Nurse Practitioner

## 2021-08-03 ENCOUNTER — Ambulatory Visit (INDEPENDENT_AMBULATORY_CARE_PROVIDER_SITE_OTHER): Payer: Medicare Other | Admitting: Nurse Practitioner

## 2021-08-03 VITALS — BP 130/80 | HR 80 | Temp 98.7°F | Resp 17 | Ht 64.0 in | Wt 229.5 lb

## 2021-08-03 DIAGNOSIS — E7849 Other hyperlipidemia: Secondary | ICD-10-CM | POA: Diagnosis not present

## 2021-08-03 DIAGNOSIS — F322 Major depressive disorder, single episode, severe without psychotic features: Secondary | ICD-10-CM

## 2021-08-03 DIAGNOSIS — F418 Other specified anxiety disorders: Secondary | ICD-10-CM | POA: Diagnosis not present

## 2021-08-03 DIAGNOSIS — I10 Essential (primary) hypertension: Secondary | ICD-10-CM | POA: Diagnosis not present

## 2021-08-03 DIAGNOSIS — Z6841 Body Mass Index (BMI) 40.0 and over, adult: Secondary | ICD-10-CM

## 2021-08-03 DIAGNOSIS — R7303 Prediabetes: Secondary | ICD-10-CM

## 2021-08-03 DIAGNOSIS — B9689 Other specified bacterial agents as the cause of diseases classified elsewhere: Secondary | ICD-10-CM

## 2021-08-03 DIAGNOSIS — J329 Chronic sinusitis, unspecified: Secondary | ICD-10-CM

## 2021-08-03 MED ORDER — LISINOPRIL-HYDROCHLOROTHIAZIDE 20-12.5 MG PO TABS: 1.0000 | ORAL_TABLET | Freq: Every day | ORAL | 3 refills | Status: DC

## 2021-08-03 MED ORDER — AMOXICILLIN-POT CLAVULANATE 875-125 MG PO TABS: 1.0000 | ORAL_TABLET | Freq: Two times a day (BID) | ORAL | 0 refills | Status: DC

## 2021-08-03 MED ORDER — FLUCONAZOLE 150 MG PO TABS: ORAL_TABLET | ORAL | 2 refills | Status: DC

## 2021-08-03 NOTE — Progress Notes (Signed)
Worthy Keeler, DNP, AGNP-c [facility] [address] [place], [place] [address] [phone] Office [phone] Fax  ESTABLISHED PATIENT- Chronic Health and/or Follow-Up Visit  Blood pressure 130/80, pulse 80, temperature 98.7 F (37.1 C), temperature source Oral, resp. rate 17, height '5\' 4"'$  (1.626 m), weight 229 lb 8 oz (104.1 kg), SpO2 99 %.  Follow-up (3 mon f/u /Pt states she has been doing well/States she went out of town on Monday woke up Tuesday with some congestion )   HPI  Lauren Briggs  is a 66 y.o. year old female presenting today for evaluation and management of the following: HTN No CP, ShOB, palpitations, HA, dizziness Taking medications as prescribed No side effects medications Mood Just got back from [place]  Doing much better with her husband She tells me that she does feel her mood is improving She is still working with a counselor independently from her husband She tells me that she does still have some trust issues but they are currently working through this. Weight Management Working with a dietician and feels that she is making great progress She has started taking mounjaro She denies any side effects or symptoms associated with the medication Going to have the second and third metatarsals in the left foot due to osteoarthritis in the near future.  (Aug 10). Sinusitis Left lung wheeze  Sinus pain and pressure Cough- dry Negative covid test She reports that her symptoms started last Monday evening/Tuesday morning after returning from out of town.  ROS All ROS negative with exception of what is listed in HPI  PHYSICAL EXAM Physical Exam Vitals and nursing note reviewed.  Constitutional:      Appearance: Normal appearance.  HENT:     Head: Normocephalic.     Right Ear: Tympanic membrane normal.     Left Ear: Tympanic membrane normal.     Nose: Congestion present.     Right Turbinates:  Enlarged.     Left Turbinates: Enlarged.     Right Sinus: Maxillary sinus tenderness and frontal sinus tenderness present.     Left Sinus: Maxillary sinus tenderness and frontal sinus tenderness present.     Mouth/Throat:     Mouth: Mucous membranes are moist.     Pharynx: Oropharynx is clear. Posterior oropharyngeal erythema present.  Eyes:     Pupils: Pupils are equal, round, and reactive to light.  Neck:     Vascular: No carotid bruit.  Cardiovascular:     Rate and Rhythm: Normal rate and regular rhythm.     Pulses: Normal pulses.     Heart sounds: Normal heart sounds.  Pulmonary:     Effort: Pulmonary effort is normal.     Breath sounds: Normal breath sounds. No wheezing or rhonchi.  Musculoskeletal:     Cervical back: Normal range of motion.     Right lower leg: No edema.     Left lower leg: No edema.  Lymphadenopathy:     Cervical: Cervical adenopathy present.  Skin:    General: Skin is warm and dry.     Capillary Refill: Capillary refill takes less than 2 seconds.  Neurological:     General: No focal deficit present.     Mental Status: She is alert and oriented to person, place, and time.  Psychiatric:        Mood and Affect: Mood normal.        Behavior: Behavior normal.  Thought Content: Thought content normal.        Judgment: Judgment normal.     ASSESSMENT & PLAN Problem List Items Addressed This Visit     Essential hypertension - Primary    Chronic.  No alarm symptoms present at this time.  Patient taking medication as prescribed with no missed doses.  Blood pressure does appear to be stable.  We will plan to continue current regimen.  Labs today for evaluation of kidney function and electrolytes.      Relevant Medications   fenofibrate micronized (LOFIBRA) 134 MG capsule   lisinopril-hydrochlorothiazide (ZESTORETIC) 20-12.5 MG tablet   Other Relevant Orders   Hemoglobin A1c (Completed)   Lipid panel (Completed)   Comprehensive metabolic panel  (Completed)   Prediabetes    Currently taking Mounjaro to help with diabetes and weight management.  She is tolerating this well.  She is following with medical management.  We will repeat A1c today.      Relevant Orders   Hemoglobin A1c (Completed)   Lipid panel (Completed)   Comprehensive metabolic panel (Completed)   CBC with Differential/Platelet (Completed)   Depression, major, single episode, severe ([place])    Patient appears to be doing significantly better with control of her anxiety and depression symptoms.  She is working with a Social worker and show signs of significant improvement.  Encourage patient to continue with counseling activities and communication with her husband to help work through issues.  If she begins having new or worsening symptoms she is aware to contact the office immediately.  No alarm symptoms present today.  No changes to medication.      Body mass index (BMI) 40.0-44.9, adult (HCC)   Relevant Medications   MOUNJARO 2.5 MG/0.5ML Pen   Other Relevant Orders   Hemoglobin A1c (Completed)   Lipid panel (Completed)   Comprehensive metabolic panel (Completed)   CBC with Differential/Platelet (Completed)   Other Visit Diagnoses     Severe situational anxiety       Other hyperlipidemia       Relevant Medications   fenofibrate micronized (LOFIBRA) 134 MG capsule   lisinopril-hydrochlorothiazide (ZESTORETIC) 20-12.5 MG tablet   Other Relevant Orders   Hemoglobin A1c (Completed)   Lipid panel (Completed)   Comprehensive metabolic panel (Completed)   CBC with Differential/Platelet (Completed)   Bacterial sinusitis       Relevant Medications   amoxicillin-clavulanate (AUGMENTIN) 875-125 MG tablet   fluconazole (DIFLUCAN) 150 MG tablet        FOLLOW-UP Return in about 6 months (around 02/03/2022) for HTN, HLD, Mood.   Worthy Keeler, DNP, AGNP-c

## 2021-08-03 NOTE — Patient Instructions (Signed)
It was a pleasure seeing you today. I hope your time spent with [place] was pleasant and helpful. Please let us know if there is anything we can do to improve the service you receive.   I am so pleased with how well you are doing! Please keep up the good work!!  I will let you know how your labs look and if there are any concerns we can plan to discuss this.    Important Office Information Lab Results If labs were ordered, please note that you will see results through [place] as soon as they come available from [facility].  It takes up to 5 business days for the results to be routed to me and for me to review them once all of the lab results have come through from [facility]. I will make recommendations based on your results and send these through [place] or someone from the office will call you to discuss. If your labs are abnormal, we may contact you to schedule a visit to discuss the results and make recommendations.  If you have not heard from [place] within 5 business days or you have waited longer than a week and your lab results have not come through on [place], please feel free to call the office or send a message through [place] to follow-up on these labs.   Referrals If referrals were placed today, the office where the referral was sent will contact you either by phone or through [place] to set up scheduling. Please note that it can take up to a week for the referral office to contact you. If you do not hear from them in a week, please contact the referral office directly to inquire about scheduling.   Condition Treated If your condition worsens or you begin to have new symptoms, please schedule a follow-up appointment for further evaluation. If you are not sure if an appointment is needed, you may call the office to leave a message for the nurse and someone will contact you with recommendations.  If you have an urgent or life threatening emergency, please do not call the office, but seek emergency  evaluation by calling 911 or going to the nearest emergency room for evaluation.   MyChart and Phone Calls Please do not use MyChart for urgent messages. It may take up to 3 business days for MyChart messages to be read by staff and if they are unable to handle the request, an additional 3 business days for them to be routed to me and for my response.  Messages sent to the provider through [place] do not come directly to the provider, please allow time for these messages to be routed and for me to respond.  We get a large volume of MyChart messages daily and these are responded to in the order received.   For urgent messages, please call the office at [phone] and speak with the front office staff or leave a message on the line of my assistant for guidance.  We are seeing patients from the hours of 8:00 am through 5:00 pm and calls directly to the nurse may not be answered immediately due to seeing patients, but your call will be returned as soon as possible.  Phone  messages received after 4:00 PM Monday through Thursday may not be returned until the following business day. Phone messages received after 11:00 AM on Friday may not be returned until Monday.   After Hours We share on call hours with providers from other offices. If you  have an urgent need after hours that cannot wait until the next business day, please contact the on call provider by calling the office number. A nurse will speak with you and contact the provider if needed for recommendations.  If you have an urgent or life threatening emergency after hours, please do not call the on call provider, but seek emergency evaluation by calling 911 or going to the nearest emergency room for evaluation.   Paperwork All paperwork requires a minimum of 5 days to complete and return to you or the designated personnel. Please keep this in mind when bringing in forms or sending requests for paperwork completion to the office.

## 2021-08-04 LAB — COMPREHENSIVE METABOLIC PANEL
ALT: 17 IU/L (ref 0–32)
AST: 23 IU/L (ref 0–40)
Albumin/Globulin Ratio: 2 (ref 1.2–2.2)
Albumin: 4.5 g/dL (ref 3.9–4.9)
Alkaline Phosphatase: 49 IU/L (ref 44–121)
BUN/Creatinine Ratio: 24 (ref 12–28)
BUN: 30 mg/dL — ABNORMAL HIGH (ref 8–27)
Bilirubin Total: 0.2 mg/dL (ref 0.0–1.2)
CO2: 23 mmol/L (ref 20–29)
Calcium: 9.5 mg/dL (ref 8.7–10.3)
Chloride: 97 mmol/L (ref 96–106)
Creatinine, Ser: 1.25 mg/dL — ABNORMAL HIGH (ref 0.57–1.00)
Globulin, Total: 2.3 g/dL (ref 1.5–4.5)
Glucose: 108 mg/dL — ABNORMAL HIGH (ref 70–99)
Potassium: 4.4 mmol/L (ref 3.5–5.2)
Sodium: 136 mmol/L (ref 134–144)
Total Protein: 6.8 g/dL (ref 6.0–8.5)
eGFR: 48 mL/min/{1.73_m2} — ABNORMAL LOW (ref >59)

## 2021-08-04 LAB — CBC WITH DIFFERENTIAL/PLATELET
Basophils Absolute: 0.1 10*3/uL (ref 0.0–0.2)
Basos: 1 %
EOS (ABSOLUTE): 0.1 10*3/uL (ref 0.0–0.4)
Eos: 2 %
Hematocrit: 40.3 % (ref 34.0–46.6)
Hemoglobin: 12.9 g/dL (ref 11.1–15.9)
Immature Grans (Abs): 0 10*3/uL (ref 0.0–0.1)
Immature Granulocytes: 0 %
Lymphocytes Absolute: 1.5 10*3/uL (ref 0.7–3.1)
Lymphs: 22 %
MCH: 29.5 pg (ref 26.6–33.0)
MCHC: 32 g/dL (ref 31.5–35.7)
MCV: 92 fL (ref 79–97)
Monocytes Absolute: 0.7 10*3/uL (ref 0.1–0.9)
Monocytes: 11 %
Neutrophils Absolute: 4.3 10*3/uL (ref 1.4–7.0)
Neutrophils: 64 %
Platelets: 301 10*3/uL (ref 150–450)
RBC: 4.38 x10E6/uL (ref 3.77–5.28)
RDW: 12.3 % (ref 11.7–15.4)
WBC: 6.7 10*3/uL (ref 3.4–10.8)

## 2021-08-04 LAB — HEMOGLOBIN A1C
Est. average glucose Bld gHb Est-mCnc: 128 mg/dL
Hgb A1c MFr Bld: 6.1 % — ABNORMAL HIGH (ref 4.8–5.6)

## 2021-08-04 LAB — LIPID PANEL
Chol/HDL Ratio: 2.1 ratio (ref 0.0–4.4)
Cholesterol, Total: 124 mg/dL (ref 100–199)
HDL: 59 mg/dL (ref >39)
LDL Chol Calc (NIH): 41 mg/dL (ref 0–99)
Triglycerides: 139 mg/dL (ref 0–149)
VLDL Cholesterol Cal: 24 mg/dL (ref 5–40)

## 2021-08-08 NOTE — Progress Notes (Signed)
Called and lvm for pt to sch repeat CMP in approximately 2 weeks.

## 2021-08-17 ENCOUNTER — Ambulatory Visit (HOSPITAL_BASED_OUTPATIENT_CLINIC_OR_DEPARTMENT_OTHER): Payer: Medicare Other

## 2021-08-29 ENCOUNTER — Encounter (INDEPENDENT_AMBULATORY_CARE_PROVIDER_SITE_OTHER): Payer: Self-pay

## 2021-08-30 ENCOUNTER — Other Ambulatory Visit: Payer: Self-pay | Admitting: Podiatry

## 2021-08-30 DIAGNOSIS — M216X2 Other acquired deformities of left foot: Secondary | ICD-10-CM | POA: Diagnosis not present

## 2021-08-30 MED ORDER — MELOXICAM 15 MG PO TABS: 15.0000 mg | ORAL_TABLET | Freq: Every day | ORAL | 1 refills | Status: DC

## 2021-08-30 MED ORDER — OXYCODONE-ACETAMINOPHEN 5-325 MG PO TABS: 1.0000 | ORAL_TABLET | ORAL | 0 refills | Status: DC | PRN

## 2021-08-30 NOTE — Progress Notes (Signed)
PRN postop 

## 2021-09-04 ENCOUNTER — Telehealth: Payer: Self-pay | Admitting: Podiatry

## 2021-09-04 ENCOUNTER — Other Ambulatory Visit: Payer: Self-pay | Admitting: Podiatry

## 2021-09-04 MED ORDER — OXYCODONE-ACETAMINOPHEN 5-325 MG PO TABS: 1.0000 | ORAL_TABLET | ORAL | 0 refills | Status: DC | PRN

## 2021-09-04 MED ORDER — OXYCODONE-ACETAMINOPHEN 10-325 MG PO TABS
1.0000 | ORAL_TABLET | ORAL | 0 refills | Status: DC | PRN
Start: 2021-09-04 — End: 2021-09-17

## 2021-09-04 NOTE — Telephone Encounter (Signed)
Pt stated she called yesterday and lvm for someone to call her back on nurse line and no one has called back. Pt is in a lot of pain and wanted a refill on Rx  Please advise.

## 2021-09-04 NOTE — Telephone Encounter (Signed)
Spoke with patient.  Refill for pain medicine sent to the pharmacy Percocet 10/325 mg.  She has an appointment tomorrow with me for follow-up.  Thanks, Dr. Amalia Hailey

## 2021-09-05 ENCOUNTER — Ambulatory Visit (INDEPENDENT_AMBULATORY_CARE_PROVIDER_SITE_OTHER): Payer: Medicare Other

## 2021-09-05 ENCOUNTER — Ambulatory Visit (INDEPENDENT_AMBULATORY_CARE_PROVIDER_SITE_OTHER): Payer: Medicare Other | Admitting: Podiatry

## 2021-09-05 DIAGNOSIS — Z9889 Other specified postprocedural states: Secondary | ICD-10-CM | POA: Diagnosis not present

## 2021-09-05 DIAGNOSIS — M778 Other enthesopathies, not elsewhere classified: Secondary | ICD-10-CM | POA: Diagnosis not present

## 2021-09-05 NOTE — Progress Notes (Signed)
Chief Complaint  Patient presents with   Post-op Follow-up    1 DOS 08/30/21  --- 2 AND 3 TARSOMETARSAL ARTHRODESIS LEFT    Subjective:  Patient presents today status post second and third TMT arthrodesis left foot. DOS: 08/30/2021.  Patient states that she is doing well.  The pain has subsided significantly.  She has been nonweightbearing in the cam boot with a wheelchair.  No new complaints at this time  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   Neuromuscular disorder ([place])    Pre-diabetes    Prediabetes 11/10/2018   Shortness of breath dyspnea     Past Surgical History:  Procedure Laterality Date   ABDOMINAL HYSTERECTOMY     PARTIAL   ANTERIOR LAT LUMBAR FUSION N/A 08/14/2018   Procedure: extreme lateral interbody fusion - Lumbar two-Lumbar three  cortical screws and removal pedicle screws Lumbar -Sacral one;  Surgeon: Kary Kos, MD;  Location: [facility];  Service: Neurosurgery;  Laterality: N/A;   APPENDECTOMY     BACK SURGERY     times 4   CARPAL TUNNEL RELEASE     BIL    CESAREAN SECTION     PARTIAL HYST   CLOSED REDUCTION ULNAR SHAFT     COLONOSCOPY     FOOT SURGERY     LT FOOT CYST REMOVED    HARDWARE REMOVAL N/A 08/14/2018   Procedure: Removal pedicle screws Lumbar -Sacral one with cortical screw fixation lumbar two-three;  Surgeon: Kary Kos, MD;  Location: [facility];  Service: Neurosurgery;  Laterality: N/A;   NASAL SINUS SURGERY     1994    radial head Right 01/14/2016   at [place] [facility] Right 01/26/2016   Procedure: RIGHT RADIAL HEAD REPLACEMENT ARTHROPLASTY WITH LIGAMENT REPAIRS;  Surgeon: Charlotte Crumb, MD;  Location: [facility];  Service: Orthopedics;  Laterality: Right;  Right elbow  raial head replacement with possible ligament repairs as needed    UPPER GASTROINTESTINAL ENDOSCOPY      Allergies  Allergen Reactions   Versed [Midazolam] Other (See Comments)    Stroke   Iodine Nausea And Vomiting    During a myelogram    Lipitor [Atorvastatin] Other (See Comments)    headaches   Zocor [Simvastatin] Other (See Comments)    Reports leg cramps w/ doses over '20mg'$  daily.   Propoxyphene Hcl Palpitations    darvon     Objective/Physical Exam Neurovascular status intact.  Skin incisions appear to be well coapted with sutures intact. No sign of infectious process noted. No dehiscence. No active bleeding noted. Moderate edema noted to the surgical extremity.  Radiographic Exam:  Orthopedic hardware and arthrodesis sites of the second and third TMT appear to be stable with routine healing.  Assessment: 1. s/p second and third TMT arthrodesis left. DOS: 08/30/2021   Plan of Care:  1. Patient was evaluated. X-rays reviewed 2.  Dressings changed.  Continue Ace wrap daily. 3.  Silvadene cream provided to apply to the incision site daily 4.  Continue strict nonweightbearing with a cam boot for immobilization 5.  Return to clinic in 2 weeks for suture removal   Edrick Kins, DPM [facility]  Dr. Edrick Kins, DPM    [address], [address] [place] [address]                Office [phone]  Fax [phone]

## 2021-09-12 ENCOUNTER — Encounter: Payer: PRIVATE HEALTH INSURANCE | Admitting: Podiatry

## 2021-09-13 NOTE — Assessment & Plan Note (Signed)
Patient appears to be doing significantly better with control of her anxiety and depression symptoms.  She is working with a Social worker and show signs of significant improvement.  Encourage patient to continue with counseling activities and communication with her husband to help work through issues.  If she begins having new or worsening symptoms she is aware to contact the office immediately.  No alarm symptoms present today.  No changes to medication.

## 2021-09-13 NOTE — Assessment & Plan Note (Signed)
Currently taking Mounjaro to help with diabetes and weight management.  She is tolerating this well.  She is following with medical management.  We will repeat A1c today.

## 2021-09-13 NOTE — Assessment & Plan Note (Signed)
>>  ASSESSMENT AND PLAN FOR DEPRESSION, MAJOR, SINGLE EPISODE, SEVERE (HCC) WRITTEN ON 09/13/2021  7:46 PM BY Jaquis Picklesimer E, NP  Patient appears to be doing significantly better with control of her anxiety and depression symptoms.  She is working with a Veterinary surgeon and show signs of significant improvement.  Encourage patient to continue with counseling activities and communication with her husband to help work through issues.  If she begins having new or worsening symptoms she is aware to contact the office immediately.  No alarm symptoms present today.  No changes to medication.

## 2021-09-13 NOTE — Assessment & Plan Note (Signed)
Chronic.  No alarm symptoms present at this time.  Patient taking medication as prescribed with no missed doses.  Blood pressure does appear to be stable.  We will plan to continue current regimen.  Labs today for evaluation of kidney function and electrolytes.

## 2021-09-17 ENCOUNTER — Ambulatory Visit (INDEPENDENT_AMBULATORY_CARE_PROVIDER_SITE_OTHER): Payer: Medicare Other | Admitting: Podiatry

## 2021-09-17 DIAGNOSIS — Z9889 Other specified postprocedural states: Secondary | ICD-10-CM

## 2021-09-17 MED ORDER — OXYCODONE-ACETAMINOPHEN 10-325 MG PO TABS: 1.0000 | ORAL_TABLET | Freq: Four times a day (QID) | ORAL | 0 refills | Status: DC | PRN

## 2021-09-17 NOTE — Progress Notes (Signed)
Chief Complaint  Patient presents with   Post-op Follow-up    POV #2 DOS 08/30/21  --- 2 AND 3 TARSOMETARSAL ARTHRODESIS LEFT, patient state that everything is going well sutures are itching.    Subjective:  Patient presents today status post second and third TMT arthrodesis left foot. DOS: 08/30/2021.  Patient states that she did put some pressure on her foot when she went to a baseball game.  She noticed increased pain and tenderness the following day but has since resolved.  She does get some intermittent pains on occasion.  She presents for further treatment and evaluation.  Otherwise doing well  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   Neuromuscular disorder ([place])    Pre-diabetes    Prediabetes 11/10/2018   Shortness of breath dyspnea     Past Surgical History:  Procedure Laterality Date   ABDOMINAL HYSTERECTOMY     PARTIAL   ANTERIOR LAT LUMBAR FUSION N/A 08/14/2018   Procedure: extreme lateral interbody fusion - Lumbar two-Lumbar three  cortical screws and removal pedicle screws Lumbar -Sacral one;  Surgeon: Kary Kos, MD;  Location: [facility];  Service: Neurosurgery;  Laterality: N/A;   APPENDECTOMY     BACK SURGERY     times 4   CARPAL TUNNEL RELEASE     BIL    CESAREAN SECTION     PARTIAL HYST   CLOSED REDUCTION ULNAR SHAFT     COLONOSCOPY     FOOT SURGERY     LT FOOT CYST REMOVED    HARDWARE REMOVAL N/A 08/14/2018   Procedure: Removal pedicle screws Lumbar -Sacral one with cortical screw fixation lumbar two-three;  Surgeon: Kary Kos, MD;  Location: [facility];  Service: Neurosurgery;  Laterality: N/A;   NASAL SINUS SURGERY     1994    radial head Right 01/14/2016   at [facility] Right 01/26/2016    Procedure: RIGHT RADIAL HEAD REPLACEMENT ARTHROPLASTY WITH LIGAMENT REPAIRS;  Surgeon: Charlotte Crumb, MD;  Location: [facility];  Service: Orthopedics;  Laterality: Right;  Right elbow raial head replacement with possible ligament repairs as needed    UPPER GASTROINTESTINAL ENDOSCOPY      Allergies  Allergen Reactions   Versed [Midazolam] Other (See Comments)    Stroke   Iodine Nausea And Vomiting    During a myelogram    Lipitor [Atorvastatin] Other (See Comments)    headaches   Zocor [Simvastatin] Other (See Comments)    Reports leg cramps w/ doses over '20mg'$  daily.   Propoxyphene Hcl Palpitations    darvon     Objective/Physical Exam Neurovascular status intact.  Skin incisions appear to be well coapted with sutures intact. No sign of infectious process noted. No dehiscence. No active bleeding noted. Moderate edema noted to the surgical extremity.  Radiographic Exam LT foot 09/05/2021:  Orthopedic hardware and arthrodesis sites of the second and third TMT appear to be stable with routine healing.  Assessment: 1. s/p second and third TMT arthrodesis left. DOS: 08/30/2021   Plan of Care:  1. Patient was evaluated.  2.  Sutures were removed 3.  Compression ankle sleeves dispensed.  Wear daily 4.  Continue strict nonweightbearing with a cam boot for immobilization 5.  Return to clinic in 2 weeks for follow-up x-ray   Edrick Kins, DPM [facility]  Dr. Edrick Kins, DPM    [address], [place] [address]                Office [phone]  Fax [phone]

## 2021-09-19 ENCOUNTER — Encounter: Payer: PRIVATE HEALTH INSURANCE | Admitting: Podiatry

## 2021-09-26 ENCOUNTER — Encounter: Payer: PRIVATE HEALTH INSURANCE | Admitting: Podiatry

## 2021-10-01 ENCOUNTER — Ambulatory Visit (INDEPENDENT_AMBULATORY_CARE_PROVIDER_SITE_OTHER): Payer: Medicare Other | Admitting: Podiatry

## 2021-10-01 ENCOUNTER — Ambulatory Visit (INDEPENDENT_AMBULATORY_CARE_PROVIDER_SITE_OTHER): Payer: Medicare Other

## 2021-10-01 DIAGNOSIS — Z9889 Other specified postprocedural states: Secondary | ICD-10-CM

## 2021-10-01 DIAGNOSIS — M778 Other enthesopathies, not elsewhere classified: Secondary | ICD-10-CM

## 2021-10-01 NOTE — Progress Notes (Signed)
Dg  

## 2021-10-01 NOTE — Progress Notes (Signed)
Chief Complaint  Patient presents with   Post-op Follow-up    Post op left foot.    Subjective:  Patient presents today status post second and third TMT arthrodesis left foot. DOS: 08/30/2021.  Overall the patient continues to do well.  No new complaints at this time  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   Neuromuscular disorder ([place])    Pre-diabetes    Prediabetes 11/10/2018   Shortness of breath dyspnea     Past Surgical History:  Procedure Laterality Date   ABDOMINAL HYSTERECTOMY     PARTIAL   ANTERIOR LAT LUMBAR FUSION N/A 08/14/2018   Procedure: extreme lateral interbody fusion - Lumbar two-Lumbar three  cortical screws and removal pedicle screws Lumbar -Sacral one;  Surgeon: Kary Kos, MD;  Location: [facility];  Service: Neurosurgery;  Laterality: N/A;   APPENDECTOMY     BACK SURGERY     times 4   CARPAL TUNNEL RELEASE     BIL    CESAREAN SECTION     PARTIAL HYST   CLOSED REDUCTION ULNAR SHAFT     COLONOSCOPY     FOOT SURGERY     LT FOOT CYST REMOVED    HARDWARE REMOVAL N/A 08/14/2018   Procedure: Removal pedicle screws Lumbar -Sacral one with cortical screw fixation lumbar two-three;  Surgeon: Kary Kos, MD;  Location: [facility];  Service: Neurosurgery;  Laterality: N/A;   NASAL SINUS SURGERY     1994    radial head Right 01/14/2016   at [place] Right 01/26/2016   Procedure: RIGHT RADIAL HEAD REPLACEMENT ARTHROPLASTY WITH LIGAMENT REPAIRS;  Surgeon: Charlotte Crumb, MD;  Location: [facility];  Service: Orthopedics;  Laterality: Right;  Right elbow raial head replacement with possible ligament repairs as needed    UPPER GASTROINTESTINAL ENDOSCOPY      Allergies  Allergen Reactions    Versed [Midazolam] Other (See Comments)    Stroke   Iodine Nausea And Vomiting    During a myelogram    Lipitor [Atorvastatin] Other (See Comments)    headaches   Zocor [Simvastatin] Other (See Comments)    Reports leg cramps w/ doses over '20mg'$  daily.   Propoxyphene Hcl Palpitations    darvon     Objective/Physical Exam Neurovascular status intact.  Incision healed with exception of a slight area to the distal incision site which appears very superficial and has some slight serous drainage. No sign of infectious process noted. No dehiscence. No active bleeding noted.  There continues to be moderate edema noted to the surgical extremity.  Radiographic Exam LT foot 10/01/2021:  Orthopedic hardware and arthrodesis sites of the second and third TMT appear to be stable with routine healing.  Assessment: 1. s/p second and third TMT arthrodesis left. DOS: 08/30/2021   Plan of Care:  1. Patient was evaluated.  2.  Continue compression ankle sleeve.  Wear daily 4.  Continue strict nonweightbearing with a cam boot for immobilization for an additional 2 weeks.  At that time the patient may begin weightbearing in the cam boot 5.  Return to clinic in 6 weeks for follow-up x-ray and to transition the patient out  of the cam boot into good supportive sneakers and shoes.   Edrick Kins, DPM [facility]  Dr. Edrick Kins, DPM    [address], [place] [address]                Office [phone]  Fax [phone]

## 2021-11-12 ENCOUNTER — Ambulatory Visit (INDEPENDENT_AMBULATORY_CARE_PROVIDER_SITE_OTHER): Payer: Medicare Other | Admitting: Podiatry

## 2021-11-12 ENCOUNTER — Ambulatory Visit (INDEPENDENT_AMBULATORY_CARE_PROVIDER_SITE_OTHER): Payer: Medicare Other

## 2021-11-12 DIAGNOSIS — Z9889 Other specified postprocedural states: Secondary | ICD-10-CM | POA: Diagnosis not present

## 2021-11-12 DIAGNOSIS — M778 Other enthesopathies, not elsewhere classified: Secondary | ICD-10-CM

## 2021-11-12 NOTE — Progress Notes (Signed)
Chief Complaint  Patient presents with   post op visit    Patient is here for post op visit, DOS 08/30/21     Subjective:  Patient presents today status post second and third TMT arthrodesis left foot. DOS: 08/30/2021.  Overall the patient continues to do well.  Patient states over the past month she has not been wearing the cam boot and wearing good supportive tennis shoes and sneakers.  She only has minimal tenderness if she is on her feet for prolonged period of time.  No new complaints at this time  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   Neuromuscular disorder ([place])    Pre-diabetes    Prediabetes 11/10/2018   Shortness of breath dyspnea     Past Surgical History:  Procedure Laterality Date   ABDOMINAL HYSTERECTOMY     PARTIAL   ANTERIOR LAT LUMBAR FUSION N/A 08/14/2018   Procedure: extreme lateral interbody fusion - Lumbar two-Lumbar three  cortical screws and removal pedicle screws Lumbar -Sacral one;  Surgeon: Kary Kos, MD;  Location: [facility];  Service: Neurosurgery;  Laterality: N/A;   APPENDECTOMY     BACK SURGERY     times 4   CARPAL TUNNEL RELEASE     BIL    CESAREAN SECTION     PARTIAL HYST   CLOSED REDUCTION ULNAR SHAFT     COLONOSCOPY     FOOT SURGERY     LT FOOT CYST REMOVED    HARDWARE REMOVAL N/A 08/14/2018   Procedure: Removal pedicle screws Lumbar -Sacral one with cortical screw fixation lumbar two-three;  Surgeon: Kary Kos, MD;  Location: [facility];  Service: Neurosurgery;  Laterality: N/A;   NASAL SINUS SURGERY     1994    radial head Right 01/14/2016   at [place] Right 01/26/2016   Procedure: RIGHT RADIAL HEAD REPLACEMENT ARTHROPLASTY WITH LIGAMENT REPAIRS;  Surgeon: Charlotte Crumb, MD;  Location: [facility];  Service: Orthopedics;  Laterality: Right;  Right elbow raial head replacement with possible ligament repairs as needed    UPPER GASTROINTESTINAL ENDOSCOPY      Allergies  Allergen Reactions   Versed [Midazolam] Other (See Comments)    Stroke   Iodine Nausea And Vomiting    During a myelogram    Lipitor [Atorvastatin] Other (See Comments)    headaches   Zocor [Simvastatin] Other (See Comments)    Reports leg cramps w/ doses over '20mg'$  daily.   Propoxyphene Hcl Palpitations    darvon     Objective/Physical Exam Neurovascular status intact.  Incision healed.  No edema noted.  Negative for any tenderness to palpation throughout the midtarsal joint.  Radiographic Exam LT foot 11/12/2021:  Orthopedic hardware and arthrodesis sites of the second and third TMT appear to be stable with routine healing.  Assessment: 1. s/p second and third TMT arthrodesis left. DOS: 08/30/2021   Plan of Care:  1. Patient was evaluated.  2.  Continue compression ankle sleeve.  Wear daily 4.  Patient has been wearing tennis shoes and sneakers now for about the last 4 weeks with no pain and only minimal tenderness if she is on her  feet for prolonged period of time 5.  Continue wearing good supportive shoes and sneakers. 6.  Return to clinic as needed   Edrick Kins, DPM [facility]  Dr. Edrick Kins, DPM    [address], [place] [address]                Office [phone]  Fax [phone]

## 2021-11-26 LAB — HM MAMMOGRAPHY

## 2021-12-04 ENCOUNTER — Ambulatory Visit (INDEPENDENT_AMBULATORY_CARE_PROVIDER_SITE_OTHER): Payer: Medicare Other | Admitting: Nurse Practitioner

## 2021-12-04 ENCOUNTER — Encounter (HOSPITAL_BASED_OUTPATIENT_CLINIC_OR_DEPARTMENT_OTHER): Payer: Self-pay | Admitting: Nurse Practitioner

## 2021-12-04 VITALS — BP 130/58 | HR 59 | Temp 98.0°F | Ht 64.0 in | Wt 224.0 lb

## 2021-12-04 DIAGNOSIS — R7303 Prediabetes: Secondary | ICD-10-CM

## 2021-12-04 DIAGNOSIS — E782 Mixed hyperlipidemia: Secondary | ICD-10-CM

## 2021-12-04 DIAGNOSIS — N3001 Acute cystitis with hematuria: Secondary | ICD-10-CM

## 2021-12-04 DIAGNOSIS — F418 Other specified anxiety disorders: Secondary | ICD-10-CM | POA: Diagnosis not present

## 2021-12-04 DIAGNOSIS — I10 Essential (primary) hypertension: Secondary | ICD-10-CM

## 2021-12-04 MED ORDER — PROPRANOLOL HCL 20 MG PO TABS: 20.0000 mg | ORAL_TABLET | Freq: Three times a day (TID) | ORAL | 3 refills | Status: DC | PRN

## 2021-12-04 MED ORDER — ESCITALOPRAM OXALATE 10 MG PO TABS: 10.0000 mg | ORAL_TABLET | Freq: Every day | ORAL | 3 refills | Status: DC

## 2021-12-04 MED ORDER — NITROFURANTOIN MONOHYD MACRO 100 MG PO CAPS: 100.0000 mg | ORAL_CAPSULE | Freq: Two times a day (BID) | ORAL | 0 refills | Status: DC

## 2021-12-04 MED ORDER — PHENAZOPYRIDINE HCL 100 MG PO TABS: 100.0000 mg | ORAL_TABLET | Freq: Three times a day (TID) | ORAL | 3 refills | Status: DC | PRN

## 2021-12-04 MED ORDER — TRAZODONE HCL 50 MG PO TABS: 50.0000 mg | ORAL_TABLET | Freq: Every day | ORAL | 3 refills | Status: DC

## 2021-12-04 NOTE — Progress Notes (Signed)
Orma Render, DNP, AGNP-c [facility] [address]  [place], [place] [address] [phone]  Subjective:   Lauren Briggs is a 66 y.o. female presents to day for evaluation of: Follow-up (Blood work checked) and Urinary Tract Infection  UTI She woke up this morning around 4 am with significant urinary pain and urgency. She denies fevers, chills, low back pain, or abdominal pain.   Lipid/Cholesterol, Follow-up  Last lipid panel Other pertinent labs  Lab Results  Component Value Date   CHOL 310 (H) 12/04/2021   HDL 56 12/04/2021   LDLCALC 207 (H) 12/04/2021   TRIG 242 (H) 12/04/2021   CHOLHDL 5.5 (H) 12/04/2021   Lab Results  Component Value Date   ALT 22 12/04/2021   AST 23 12/04/2021   PLT 357 12/04/2021   TSH 0.964 05/04/2021     She reports good compliance with treatment. She is not having side effects.   Symptoms: No chest pain No chest pressure/discomfort  No dyspnea No lower extremity edema  No numbness or tingling of extremity No orthopnea  No palpitations No paroxysmal nocturnal dyspnea  No speech difficulty No syncope   Current diet: in general, a "healthy" diet   Current exercise: walking  The ASCVD Risk score (Arnett DK, et al., 2019) failed to calculate for the following reasons:   The patient has a prior MI or stroke diagnosis  ---------------------------------------------------------------------------------------------------  She tells me she spent a month at the beach in July and this was overall helpful for her marriage and mental health.    PMH, Medications, and Allergies reviewed and updated in chart as appropriate.   ROS negative except for what is listed in HPI. Objective:  BP (!) 130/58 (BP Location: Right Arm, Patient Position: Sitting)   Pulse (!) 59   Temp 98 F (36.7 C)   Ht '5\' 4"'$  (1.626 m)   Wt 224 lb (101.6 kg)   SpO2 98%   BMI 38.45 kg/m  Physical Exam         Assessment & Plan:   Problem List Items Addressed This Visit     Mixed hypercholesterolemia and hypertriglyceridemia    Labs pending today. No alarm sx present. Working on diet and exercise. She is restarting her weight management diet. Will monitor labs today.       Relevant Medications   propranolol (INDERAL) 20 MG tablet   Other Relevant Orders   POCT Urinalysis Dipstick   CBC with Differential/Platelet (Completed)   Comprehensive metabolic panel (Completed)   Hemoglobin A1c (Completed)   Lipid panel (Completed)   Essential hypertension    Chronic. BP good today. Goal <130/80. Continue current regimen. Will monitor labs today. Continue working on diet and exercise.       Relevant Medications   propranolol (INDERAL) 20 MG tablet   Other Relevant Orders   POCT Urinalysis Dipstick   CBC with Differential/Platelet (Completed)   Comprehensive metabolic panel (Completed)   Hemoglobin A1c (Completed)   Lipid panel (Completed)   Prediabetes    Chronic. Labs pending. Continue to work on diet and exercise.       Relevant Orders   POCT Urinalysis Dipstick   CBC with Differential/Platelet (Completed)   Comprehensive metabolic panel (Completed)   Hemoglobin A1c (Completed)   Lipid panel (Completed)   Other specified anxiety disorders    Anxiety is improving. She is working with counseling and is continuing to work on her relationship.  Relevant Medications   traZODone (DESYREL) 50 MG tablet   escitalopram (LEXAPRO) 10 MG tablet   Acute cystitis with hematuria - Primary    Positive UA today with symptoms consistent with cystitis. We will send treatment today with Macrobid '100mg'$  BID x 5d and Phenazopyridine '200mg'$  TID for spasms. She will follow-up if she does not have improvement of symptoms with completion of therapy.       Relevant Medications   phenazopyridine (PYRIDIUM) 100 MG tablet   Other Relevant Orders   POCT Urinalysis Dipstick   Other Visit Diagnoses      Situational anxiety       Relevant Medications   traZODone (DESYREL) 50 MG tablet   propranolol (INDERAL) 20 MG tablet   escitalopram (LEXAPRO) 10 MG tablet         Orma Render, DNP, AGNP-c 01/20/2022  8:39 PM    History, Medications, Surgery, SDOH, and Family History reviewed and updated as appropriate.

## 2021-12-04 NOTE — Patient Instructions (Addendum)
I will work to get your chart set up to have a block to keep anyone out that is not directly involved in patient care. If this cannot be done I will let you know.   I have sent the refills of your medications for you.

## 2021-12-05 LAB — COMPREHENSIVE METABOLIC PANEL
ALT: 22 IU/L (ref 0–32)
AST: 23 IU/L (ref 0–40)
Albumin/Globulin Ratio: 1.9 (ref 1.2–2.2)
Albumin: 4.8 g/dL (ref 3.9–4.9)
Alkaline Phosphatase: 49 IU/L (ref 44–121)
BUN/Creatinine Ratio: 23 (ref 12–28)
BUN: 24 mg/dL (ref 8–27)
Bilirubin Total: 0.3 mg/dL (ref 0.0–1.2)
CO2: 26 mmol/L (ref 20–29)
Calcium: 10.5 mg/dL — ABNORMAL HIGH (ref 8.7–10.3)
Chloride: 100 mmol/L (ref 96–106)
Creatinine, Ser: 1.05 mg/dL — ABNORMAL HIGH (ref 0.57–1.00)
Globulin, Total: 2.5 g/dL (ref 1.5–4.5)
Glucose: 97 mg/dL (ref 70–99)
Potassium: 4.9 mmol/L (ref 3.5–5.2)
Sodium: 139 mmol/L (ref 134–144)
Total Protein: 7.3 g/dL (ref 6.0–8.5)
eGFR: 59 mL/min/{1.73_m2} — ABNORMAL LOW (ref >59)

## 2021-12-05 LAB — CBC WITH DIFFERENTIAL/PLATELET
Basophils Absolute: 0.1 10*3/uL (ref 0.0–0.2)
Basos: 1 %
EOS (ABSOLUTE): 0.2 10*3/uL (ref 0.0–0.4)
Eos: 2 %
Hematocrit: 39.8 % (ref 34.0–46.6)
Hemoglobin: 12.8 g/dL (ref 11.1–15.9)
Immature Grans (Abs): 0 10*3/uL (ref 0.0–0.1)
Immature Granulocytes: 0 %
Lymphocytes Absolute: 3 10*3/uL (ref 0.7–3.1)
Lymphs: 33 %
MCH: 29.6 pg (ref 26.6–33.0)
MCHC: 32.2 g/dL (ref 31.5–35.7)
MCV: 92 fL (ref 79–97)
Monocytes Absolute: 0.5 10*3/uL (ref 0.1–0.9)
Monocytes: 6 %
Neutrophils Absolute: 5.4 10*3/uL (ref 1.4–7.0)
Neutrophils: 58 %
Platelets: 357 10*3/uL (ref 150–450)
RBC: 4.33 x10E6/uL (ref 3.77–5.28)
RDW: 13 % (ref 11.7–15.4)
WBC: 9.2 10*3/uL (ref 3.4–10.8)

## 2021-12-05 LAB — HEMOGLOBIN A1C
Est. average glucose Bld gHb Est-mCnc: 126 mg/dL
Hgb A1c MFr Bld: 6 % — ABNORMAL HIGH (ref 4.8–5.6)

## 2021-12-05 LAB — LIPID PANEL
Chol/HDL Ratio: 5.5 ratio — ABNORMAL HIGH (ref 0.0–4.4)
Cholesterol, Total: 310 mg/dL — ABNORMAL HIGH (ref 100–199)
HDL: 56 mg/dL (ref >39)
LDL Chol Calc (NIH): 207 mg/dL — ABNORMAL HIGH (ref 0–99)
Triglycerides: 242 mg/dL — ABNORMAL HIGH (ref 0–149)
VLDL Cholesterol Cal: 47 mg/dL — ABNORMAL HIGH (ref 5–40)

## 2021-12-07 ENCOUNTER — Ambulatory Visit (INDEPENDENT_AMBULATORY_CARE_PROVIDER_SITE_OTHER): Payer: Medicare Other | Admitting: Podiatry

## 2021-12-07 ENCOUNTER — Ambulatory Visit (INDEPENDENT_AMBULATORY_CARE_PROVIDER_SITE_OTHER): Payer: Medicare Other

## 2021-12-07 DIAGNOSIS — M960 Pseudarthrosis after fusion or arthrodesis: Secondary | ICD-10-CM

## 2021-12-07 DIAGNOSIS — M8588 Other specified disorders of bone density and structure, other site: Secondary | ICD-10-CM | POA: Diagnosis not present

## 2021-12-07 NOTE — Progress Notes (Addendum)
Chief Complaint  Patient presents with   Follow-up    Patient is here for left foot pain, patient states that her left foot is still hurting.    Subjective:  Patient presents today status post second and third TMT arthrodesis left foot. DOS: 08/30/2021.  Patient states that with activity she has developed pain and tenderness associated to the surgical site.  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   Neuromuscular disorder ([place])    Pre-diabetes    Prediabetes 11/10/2018   Shortness of breath dyspnea     Past Surgical History:  Procedure Laterality Date   ABDOMINAL HYSTERECTOMY     PARTIAL   ANTERIOR LAT LUMBAR FUSION N/A 08/14/2018   Procedure: extreme lateral interbody fusion - Lumbar two-Lumbar three  cortical screws and removal pedicle screws Lumbar -Sacral one;  Surgeon: Kary Kos, MD;  Location: [facility];  Service: Neurosurgery;  Laterality: N/A;   APPENDECTOMY     BACK SURGERY     times 4   CARPAL TUNNEL RELEASE     BIL    CESAREAN SECTION     PARTIAL HYST   CLOSED REDUCTION ULNAR SHAFT     COLONOSCOPY     FOOT SURGERY     LT FOOT CYST REMOVED    HARDWARE REMOVAL N/A 08/14/2018   Procedure: Removal pedicle screws Lumbar -Sacral one with cortical screw fixation lumbar two-three;  Surgeon: Kary Kos, MD;  Location: [facility];  Service: Neurosurgery;  Laterality: N/A;   NASAL SINUS SURGERY     1994    radial head Right 01/14/2016   at [place] Right 01/26/2016   Procedure: RIGHT RADIAL HEAD REPLACEMENT ARTHROPLASTY WITH LIGAMENT REPAIRS;  Surgeon: Charlotte Crumb, MD;  Location: [facility];  Service: Orthopedics;  Laterality: Right;  Right  elbow raial head replacement with possible ligament repairs as needed    UPPER GASTROINTESTINAL ENDOSCOPY      Allergies  Allergen Reactions   Versed [Midazolam] Other (See Comments)    Stroke   Iodine Nausea And Vomiting    During a myelogram    Lipitor [Atorvastatin] Other (See Comments)    headaches   Zocor [Simvastatin] Other (See Comments)    Reports leg cramps w/ doses over '20mg'$  daily.   Propoxyphene Hcl Palpitations    darvon     Objective/Physical Exam Neurovascular status intact.  Incision healed.  No edema noted.  There is some tenderness throughout palpation throughout the midtarsal joint of the foot.  Radiographic Exam LT foot 12/07/2021:  Orthopedic hardware of the second and third TMT appear to be stable.  On oblique view there is approximately 2 mm gapping of the third TMT consistent with nonunion of the arthrodesis site  Assessment: 1. s/p second and third TMT arthrodesis left. DOS: 08/30/2021 2.  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  Patient was evaluated.  X-rays reviewed demonstrating 2 mm gapping between the arthrodesis site of the third TMT 2.  Today we are going to try and approve the patient for an external bone stimulator. [employer] bone stimulator rep contacted 3.  In the meantime, can continue good supportive shoes and sneakers 4.  Patient already taking 5000iu vitamin D daily 5.  Return to clinic 8 weeks for follow-up x-ray  Edrick Kins, DPM [facility]  Dr. Edrick Kins, DPM    [address], [place] [address]                Office [phone]  Fax [phone]

## 2021-12-17 ENCOUNTER — Encounter: Payer: Self-pay | Admitting: Nurse Practitioner

## 2021-12-17 ENCOUNTER — Encounter: Payer: Self-pay | Admitting: Podiatry

## 2021-12-17 ENCOUNTER — Ambulatory Visit (INDEPENDENT_AMBULATORY_CARE_PROVIDER_SITE_OTHER): Payer: Medicare Other | Admitting: Neurology

## 2021-12-17 VITALS — Ht 64.0 in | Wt 230.0 lb

## 2021-12-17 DIAGNOSIS — R413 Other amnesia: Secondary | ICD-10-CM

## 2021-12-17 DIAGNOSIS — Z9189 Other specified personal risk factors, not elsewhere classified: Secondary | ICD-10-CM | POA: Diagnosis not present

## 2021-12-17 DIAGNOSIS — N3090 Cystitis, unspecified without hematuria: Secondary | ICD-10-CM

## 2021-12-17 DIAGNOSIS — E785 Hyperlipidemia, unspecified: Secondary | ICD-10-CM

## 2021-12-17 DIAGNOSIS — I63332 Cerebral infarction due to thrombosis of left posterior cerebral artery: Secondary | ICD-10-CM | POA: Diagnosis not present

## 2021-12-17 MED ORDER — ASPIRIN 81 MG PO TBEC: 81.0000 mg | DELAYED_RELEASE_TABLET | Freq: Every day | ORAL | 12 refills | Status: AC

## 2021-12-17 MED ORDER — LEQVIO 284 MG/1.5ML ~~LOC~~ SOSY: 284.0000 mg | PREFILLED_SYRINGE | Freq: Once | SUBCUTANEOUS | 0 refills | Status: AC

## 2021-12-17 MED ORDER — SIMVASTATIN 20 MG PO TABS: 20.0000 mg | ORAL_TABLET | Freq: Every day | ORAL | 3 refills | Status: DC

## 2021-12-17 NOTE — Progress Notes (Signed)
[facility] [address] [place]. [place] [address] [phone]       OFFICE CONSULT NOTE  [name] Date of Birth:  [birth year] Medical Record Number:  [id number]   Referring MD: Kary Kos  Reason for Referral: Stroke  HPI: Initial visit 01/03/2021 :Lauren Briggs is a 66 year old pleasant Caucasian lady seen today for initial office consultation visit.  History is obtained from the patient and her husband is accompanying her and review of referral notes.  I personally reviewed available imaging films in PACS.  Patient states she is started having some memory difficulties after her sixth back surgery in December 22, 2019.  She felt disoriented confused and trouble remembering information for surgery.  She also had some trouble with the right-sided vision.  Over the next several weeks symptoms gradually improved she still does not feel her memory is back to baseline.  She still has to remember information and think.  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  MR angiogram of the brain on 09/28/2020 had shown left PCA occlusion as well as moderate atherosclerotic changes in various intracranial vessels.  Lab work on 09/01/2020 had shown LDL cholesterol '1 1 2 '$ mg percent triglycerides of 201 mg percent.  Patient has been started on aspirin 325 mg daily since then and is also on Zetia 10 mg and Zocor 20 mg.  She still has some occasional naming and word finding difficulties but these seem to be improving and they are not progressive.  She has no prior history of strokes, TIAs or significant neurological issues except chronic back pain for which she is undergone multiple back surgeries.  She denies history of depression, significant prior head injury with loss of consciousness.  No family history of memory loss or dementia.  On Mini-Mental status exam testing today she  scored 28/30 regarding depression scale she is not depressed.  Clock drawing 4/4. Update 06/07/2021 :  She returns for follow-up after last visit 5 months ago.  She states some memory difficulties are about the same and unchanged.  She has not been participating much in doing mentally challenging activities.  She has not noticed any worsening of her cognition and memory or ability to care for herself.  She remains fully independent in all activities of daily living.  She has had no recurrent stroke or TIA symptoms.  She remains on aspirin 81 mg daily which is tolerating well without any side effects like bruising or bleeding.  She has history of intolerance to statins and has tried Lipitor and Zocor in the past.  Lipid profile on 05/04/2021 showed LDL yet to be elevated at '1 1 4 '$ mg percent.  Hemoglobin A1c of 6.4.  Her husband states that she does snore.  Patient has never been evaluated for sleep apnea but appears to be at risk and is willing to undergo testing.  On Mini-Mental status exam today she scored 28/30 which is unchanged from last the patient was started by me on Repatha injections at last visit but significantly for a few doses.  Clock drawing score of 4/4 and she was able to name 13 animals which can walk on 4 legs. Update 12/17/2021 : She returns for follow-up after last visit 6 months ago.  She states her memory and changes made back to slightly better.  She occasionally has some word finding difficulties.  She is planning to get ready for the tax season and is fearful that she may have more trouble.  She underwent foot surgery on the left in August for arthritis at her ankle. Unfortunately bone has not healed well and she had to be nonweightbearing for 12 weeks.  Her physician has recently ordered a bone stimulator but she is yet to see them and started currently on simvastatin 20 mg cannot tolerate a higher dose.  The patient was started by me on Repatha injections but she was unable to tolerate  it due to feeling of lightheadedness and weird sensation and hence discontinued it.  She is willing to try Leqvio if her insurance will approve it.  She has not yet been screened for sleep apnea wants referral.  Remains on aspirin which is tolerating well without bleeding or bruising.  She has not had any recurrent TIA or stroke symptoms.  She has no new complaints today. 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   Neuromuscular disorder ([place])    Pre-diabetes    Prediabetes 11/10/2018   Shortness of breath dyspnea     Social History:  Social History   Socioeconomic History   Marital status: Married    Spouse name: keith   Number of children: Not on file   Years of education: Not on file   Highest education level: Not on file  Occupational History   Occupation: retired Radio broadcast assistant  Tobacco Use   Smoking status: Former    Years: 3.00    Types: Cigarettes    Quit date: 1981    Years since quitting: 42.9   Smokeless tobacco: Former    Quit date: 01/22/1979  Vaping Use   Vaping Use: Never used  Substance and Sexual Activity   Alcohol use: Yes    Comment: occasional bourbon and ginger ale    Drug use: No   Sexual activity: Not on file  Other Topics Concern   Not on file  Social History Narrative   Not on file   Social Determinants of Health   Financial Resource Strain: Not on file  Food Insecurity: Not on file  Transportation Needs: Not on file  Physical Activity: Not on file  Stress: Not on file  Social Connections: Not on  file  Intimate Partner Violence: Not on file    Medications:   Current Outpatient Medications on File Prior to Visit  Medication Sig Dispense Refill   betamethasone valerate ointment (VALISONE) 0.1 % SMARTSIG:sparingly Topical Twice Daily PRN     Coenzyme Q10 (COQ-10) 100 MG CAPS Take 100 mg by mouth in the morning and at bedtime. otc 30 capsule 0   escitalopram (LEXAPRO) 10 MG tablet Take 1 tablet (10 mg total) by mouth at bedtime. 90 tablet 3   estradiol (ESTRACE) 1 MG tablet Take 1 mg by mouth as directed. 1 tab every other day     ezetimibe (ZETIA) 10 MG tablet TAKE 1 TABLET(10 MG) BY MOUTH DAILY 90 tablet 3  fenofibrate micronized (LOFIBRA) 134 MG capsule Take 134 mg by mouth daily.     lisinopril-hydrochlorothiazide (ZESTORETIC) 20-12.5 MG tablet Take 1 tablet by mouth daily. 90 tablet 3   Multiple Vitamin (MULTIVITAMIN WITH MINERALS) TABS Take 1 tablet by mouth daily after supper. Women's 50 plus     phenazopyridine (PYRIDIUM) 100 MG tablet Take 1 tablet (100 mg total) by mouth 3 (three) times daily as needed for pain. 15 tablet 3   propranolol (INDERAL) 20 MG tablet Take 1 tablet (20 mg total) by mouth 3 (three) times daily as needed. 270 tablet 3   traZODone (DESYREL) 50 MG tablet Take 1 tablet (50 mg total) by mouth at bedtime. 90 tablet 3   No current facility-administered medications on file prior to visit.    Allergies:   Allergies  Allergen Reactions   Versed [Midazolam] Other (See Comments)    Stroke   Iodine Nausea And Vomiting    During a myelogram    Lipitor [Atorvastatin] Other (See Comments)    headaches   Zocor [Simvastatin] Other (See Comments)    Reports leg cramps w/ doses over '20mg'$  daily.   Propoxyphene Hcl Palpitations    darvon     Physical Exam General: Mildly obese, pleasant middle-aged Caucasian lady seated, in no evident distress Head: head normocephalic and atraumatic.   Neck: supple with no carotid or supraclavicular bruits Cardiovascular:  regular rate and rhythm, no murmurs Musculoskeletal: no deformity Skin:  no rash/petichiae Vascular:  Normal pulses all extremities  Neurologic Exam Mental Status: Awake and fully alert. Oriented to place and time. Recent and remote memory intact. Attention span, concentration and fund of knowledge appropriate. Mood and affect appropriate.  Mini-Mental status exam not done this visit.  ( Last visit Score 28/30 with deficits in recall).  Geriatric depression scale not done.  Clock drawing 4/4.  Able to name 14 animals which can walk on 4 legs. Cranial Nerves: Fundoscopic exam not done. Pupils equal, briskly reactive to light. Extraocular movements full without nystagmus. Visual fields full to confrontation. Hearing intact. Facial sensation intact. Face, tongue, palate moves normally and symmetrically.  Motor: Normal bulk and tone. Normal strength in all tested extremity muscles. Sensory.: intact to touch , pinprick , position and vibratory sensation.  Coordination: Rapid alternating movements normal in all extremities. Finger-to-nose and heel-to-shin performed accurately bilaterally. Gait and Station: Arises from chair without difficulty. Stance is normal. Gait demonstrates normal stride length and balance . Able to heel, toe and tandem walk with great difficulty.  Reflexes: 1+ and symmetric. Toes downgoing.        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  Abnormal MRI also  showing remote age right basal ganglia infarct and MRA showing diffuse intracranial atherosclerotic changes.  Vascular risk factors of hyperlipidemia, hypertension, obesity and intracranial atherosclerosis.  She also has poststroke mild cognitive impairment which  appears stable.  She also appears to be at risk for obstructive sleep apnea.  He has history of statin intolerance and also not tolerated Repatha injections.Marland Kitchen       PLAN:I had a long discussion with the patient regarding her stroke and recommend she continue on aspirin for stroke prevention and maintain aggressive risk factor modification with strict control of hypertension with blood pressure goal below 130/90, lipids with LDL cholesterol goal below 70 mg percent and diabetes with hemoglobin A1c goal below 6.5%.  She was also encouraged to eat a healthy diet with lots of fruits, vegetables, cereals, whole grains and to be active and exercise regularly and lose weight.  Patient has history of statin intolerance  and was unable to tolerate Repatha and Zetia alone is not controlling her lipids and so recommend she try Leqvio injections if approved by insurance.  She also appears to be at risk for sleep apnea and I recommend checking polysomnogram for sleep apnea.  We also discussed memory compensation strategies and encouraged her to increase participation in mentally changing activities like solving crossword puzzles, playing bridge and sudoku.  Check polysomnogram for sleep apnea she will return for follow-up in the future in 6 months or call earlier if necessary. Greater than 50% time during this prolonged 35-minute  visit was spent in counseling and coordination of care about her remote stroke and memory loss and answering questions. Antony Contras, MD Note: This document was prepared with digital dictation and possible smart phrase technology. Any transcriptional errors that result from this process are unintentional.

## 2021-12-17 NOTE — Patient Instructions (Signed)
:  I had a long discussion with the patient regarding her stroke and recommend she continue on aspirin for stroke prevention and maintain aggressive risk factor modification with strict control of hypertension with blood pressure goal below 130/90, lipids with LDL cholesterol goal below 70 mg percent and diabetes with hemoglobin A1c goal below 6.5%.  She was also encouraged to eat a healthy diet with lots of fruits, vegetables, cereals, whole grains and to be active and exercise regularly and lose weight.  Patient has history of statin intolerance  and was unable to tolerate Repatha and Zetia alone is not controlling her lipids and so recommend she try Leqvio injections if approved by insurance.  She also appears to be at risk for sleep apnea and I recommend checking polysomnogram for sleep apnea.  We also discussed memory compensation strategies and encouraged her to increase participation in mentally changing activities like solving crossword puzzles, playing bridge and sudoku.  Check polysomnogram for sleep apnea she will return for follow-up in the future in 6 months or call earlier if necessary.

## 2021-12-19 ENCOUNTER — Telehealth: Payer: Self-pay

## 2021-12-19 NOTE — Telephone Encounter (Signed)
Leqvio start form sent to intake team, confirmation of fax received.

## 2021-12-24 NOTE — Telephone Encounter (Signed)
Marion Downer Asencion Partridge) Need insurance information to continue with benefit investigation. Would like a call from the nurse  Contact info: [phone]

## 2021-12-24 NOTE — Telephone Encounter (Signed)
Demographics faxed as requested to intake team.

## 2021-12-27 NOTE — Addendum Note (Signed)
Addended by: Edrick Kins on: 12/27/2021 03:50 PM   Modules accepted: Orders

## 2022-01-07 ENCOUNTER — Ambulatory Visit: Payer: Medicare Other | Admitting: Podiatry

## 2022-01-07 MED ORDER — NITROFURANTOIN MONOHYD MACRO 100 MG PO CAPS: 100.0000 mg | ORAL_CAPSULE | Freq: Two times a day (BID) | ORAL | 0 refills | Status: DC

## 2022-01-07 MED ORDER — FLUCONAZOLE 150 MG PO TABS: ORAL_TABLET | ORAL | 2 refills | Status: DC

## 2022-01-08 ENCOUNTER — Other Ambulatory Visit: Payer: Self-pay

## 2022-01-08 ENCOUNTER — Telehealth: Payer: Self-pay | Admitting: *Deleted

## 2022-01-08 ENCOUNTER — Other Ambulatory Visit: Payer: Self-pay | Admitting: Podiatry

## 2022-01-08 DIAGNOSIS — M8588 Other specified disorders of bone density and structure, other site: Secondary | ICD-10-CM

## 2022-01-08 NOTE — Telephone Encounter (Signed)
Lauren Briggs from [place] started the Prior auth with Cover My meds KEY#[id number] for the pt's Lauren Briggs and is wanting to know if any information has been received regarding this.  [phone]

## 2022-01-08 NOTE — Telephone Encounter (Signed)
Called [facility],and they have no record of a prescription for a bone stimulator in their records, tried to call [facility], left a voice message to return call for the status.We will have to resend to [facility] along with the prescription, clinicals notes w/ medically necessity, xrays. Can resend directly to company at [phone]

## 2022-01-08 NOTE — Telephone Encounter (Signed)
Faxed the notes/ order/ xrays to [employer] [employer] , confirmation received 01/08/22. Patient is aware.

## 2022-01-09 NOTE — Telephone Encounter (Signed)
PA for this med submitted via cmm (Key: [id number])  Your information has been sent to [facility].

## 2022-01-15 NOTE — Telephone Encounter (Signed)
Phone rep after checking DPR left vm for pt to call for the update on the medication from [place], [place]

## 2022-01-15 NOTE — Telephone Encounter (Signed)
Please update  Med approved.  Approved on December 21 PA Case: [id number], Status: Approved, Coverage Starts on: 01/21/2021 12:00:00 AM, Coverage Ends on: 01/21/2023 12:00:00 AM. Questions? Contact [phone]. Authorization Expiration Date: 01/20/2023

## 2022-01-20 DIAGNOSIS — N3001 Acute cystitis with hematuria: Secondary | ICD-10-CM

## 2022-01-20 HISTORY — DX: Acute cystitis with hematuria: N30.01

## 2022-01-20 NOTE — Assessment & Plan Note (Signed)
Chronic. Labs pending. Continue to work on diet and exercise.

## 2022-01-20 NOTE — Assessment & Plan Note (Signed)
Positive UA today with symptoms consistent with cystitis. We will send treatment today with Macrobid '100mg'$  BID x 5d and Phenazopyridine '200mg'$  TID for spasms. She will follow-up if she does not have improvement of symptoms with completion of therapy.

## 2022-01-20 NOTE — Assessment & Plan Note (Signed)
Labs pending today. No alarm sx present. Working on diet and exercise. She is restarting her weight management diet. Will monitor labs today.

## 2022-01-20 NOTE — Assessment & Plan Note (Signed)
>>  ASSESSMENT AND PLAN FOR OTHER SPECIFIED ANXIETY DISORDERS WRITTEN ON 01/20/2022  8:39 PM BY Terrelle Ruffolo E, NP  Anxiety is improving. She is working with counseling and is continuing to work on her relationship.

## 2022-01-20 NOTE — Assessment & Plan Note (Signed)
Anxiety is improving. She is working with counseling and is continuing to work on her relationship.

## 2022-01-20 NOTE — Assessment & Plan Note (Signed)
Chronic. BP good today. Goal <130/80. Continue current regimen. Will monitor labs today. Continue working on diet and exercise.

## 2022-01-23 ENCOUNTER — Ambulatory Visit (INDEPENDENT_AMBULATORY_CARE_PROVIDER_SITE_OTHER): Payer: Medicare Other | Admitting: Podiatry

## 2022-01-23 DIAGNOSIS — M960 Pseudarthrosis after fusion or arthrodesis: Secondary | ICD-10-CM

## 2022-01-23 NOTE — Progress Notes (Signed)
Chief Complaint  Patient presents with   Follow-up    Follow up on left foot pain, patient stated that the pain still exists and will not heal.     Subjective:  Patient presents today status post second and third TMT arthrodesis left foot. DOS: 08/30/2021.  Patient continues to have pain and tenderness to the nonunion area.  She most recently finally received the [employer] Bone Stimulator and started using it beginning 01/21/2023.  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   Neuromuscular disorder ([place])    Pre-diabetes    Prediabetes 11/10/2018   Shortness of breath dyspnea     Past Surgical History:  Procedure Laterality Date   ABDOMINAL HYSTERECTOMY     PARTIAL   ANTERIOR LAT LUMBAR FUSION N/A 08/14/2018   Procedure: extreme lateral interbody fusion - Lumbar two-Lumbar three  cortical screws and removal pedicle screws Lumbar -Sacral one;  Surgeon: Kary Kos, MD;  Location: [facility];  Service: Neurosurgery;  Laterality: N/A;   APPENDECTOMY     BACK SURGERY     times 4   CARPAL TUNNEL RELEASE     BIL    CESAREAN SECTION     PARTIAL HYST   CLOSED REDUCTION ULNAR SHAFT     COLONOSCOPY     FOOT SURGERY     LT FOOT CYST REMOVED    HARDWARE REMOVAL N/A 08/14/2018   Procedure: Removal pedicle screws Lumbar -Sacral one with cortical screw fixation lumbar two-three;  Surgeon: Kary Kos, MD;  Location: [facility];  Service: Neurosurgery;  Laterality: N/A;   NASAL SINUS SURGERY     1994    radial head Right 01/14/2016   at [place] Right 01/26/2016   Procedure: RIGHT RADIAL HEAD REPLACEMENT ARTHROPLASTY WITH LIGAMENT REPAIRS;  Surgeon: Charlotte Crumb, MD;  Location: [facility];  Service: Orthopedics;   Laterality: Right;  Right elbow raial head replacement with possible ligament repairs as needed    UPPER GASTROINTESTINAL ENDOSCOPY      Allergies  Allergen Reactions   Versed [Midazolam] Other (See Comments)    Stroke   Iodine Nausea And Vomiting    During a myelogram    Lipitor [Atorvastatin] Other (See Comments)    headaches   Zocor [Simvastatin] Other (See Comments)    Reports leg cramps w/ doses over '20mg'$  daily.   Propoxyphene Hcl Palpitations    darvon     Objective/Physical Exam Neurovascular status intact.  Incision healed.  No edema noted.  There is some tenderness throughout palpation throughout the midtarsal joint of the foot.  Radiographic Exam LT foot 12/07/2021:  Orthopedic hardware of the second and third TMT appear to be stable.  On oblique view there is approximately 2 mm gapping of the third TMT consistent with nonunion of the arthrodesis site  Assessment: 1. s/p second and third TMT arthrodesis left. DOS: 08/30/2021 2.  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  Patient was evaluated.  Prior x-rays demonstrating 2 mm gapping between the arthrodesis site of the third TMT 2.  Patient states that she just received and started using the [employer]ogen Bone Stimulator on 01/20/2022.  Continue daily as instructed.  Since she has only been using it for 3 days we will wait 6 weeks to have follow-up x-rays to see the progression of the nonunion site 3.  Return to clinic 6 weeks for follow-up x-ray  Edrick Kins, DPM [facility]  Dr. Edrick Kins, DPM    [address], [place] [address]                Office [phone]  Fax [phone]

## 2022-01-24 NOTE — Telephone Encounter (Signed)
Mandy from [place] [facility] called wanting to inform the provider that the pt is refusing to be on the [place]. Call back at [phone] if any questions.

## 2022-02-04 ENCOUNTER — Ambulatory Visit (HOSPITAL_BASED_OUTPATIENT_CLINIC_OR_DEPARTMENT_OTHER): Payer: Medicare Other | Admitting: Nurse Practitioner

## 2022-02-08 ENCOUNTER — Telehealth: Payer: Self-pay | Admitting: Podiatry

## 2022-02-08 NOTE — Telephone Encounter (Signed)
Pt called stating she is using the bone stimulator and it is not really helping her foot.   I did schedule pt to come in Monday 1.22 to see you in [place].

## 2022-02-08 NOTE — Telephone Encounter (Signed)
Sounds good. Thanks, Dr. Amalia Hailey

## 2022-02-11 ENCOUNTER — Ambulatory Visit (INDEPENDENT_AMBULATORY_CARE_PROVIDER_SITE_OTHER): Payer: Medicare Other | Admitting: Podiatry

## 2022-02-11 ENCOUNTER — Encounter: Payer: Self-pay | Admitting: Podiatry

## 2022-02-11 ENCOUNTER — Ambulatory Visit (INDEPENDENT_AMBULATORY_CARE_PROVIDER_SITE_OTHER): Payer: Medicare Other

## 2022-02-11 DIAGNOSIS — M960 Pseudarthrosis after fusion or arthrodesis: Secondary | ICD-10-CM | POA: Diagnosis not present

## 2022-02-11 NOTE — Progress Notes (Signed)
Chief Complaint  Patient presents with   Foot Pain    left foot not getting better with bone stimulator, Rate of pain 6 out of 10,     Subjective:  Patient presents today status post second and third TMT arthrodesis left foot. DOS: 08/30/2021.  Patient continues to have pain and tenderness to the nonunion area.  Patient has been using the [employer]ogen Bone Stimulator and started using it beginning 01/21/2023.  Patient is very frustrated due to the pain and tenderness she is experiencing.  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   Neuromuscular disorder ([place])    Pre-diabetes    Prediabetes 11/10/2018   Shortness of breath dyspnea     Past Surgical History:  Procedure Laterality Date   ABDOMINAL HYSTERECTOMY     PARTIAL   ANTERIOR LAT LUMBAR FUSION N/A 08/14/2018   Procedure: extreme lateral interbody fusion - Lumbar two-Lumbar three  cortical screws and removal pedicle screws Lumbar -Sacral one;  Surgeon: Kary Kos, MD;  Location: [facility];  Service: Neurosurgery;  Laterality: N/A;   APPENDECTOMY     BACK SURGERY     times 4   CARPAL TUNNEL RELEASE     BIL    CESAREAN SECTION     PARTIAL HYST   CLOSED REDUCTION ULNAR SHAFT     COLONOSCOPY     FOOT SURGERY     LT FOOT CYST REMOVED    HARDWARE REMOVAL N/A 08/14/2018   Procedure: Removal pedicle screws Lumbar -Sacral one with cortical screw fixation lumbar two-three;  Surgeon: Kary Kos, MD;  Location: [facility];  Service: Neurosurgery;  Laterality: N/A;   NASAL SINUS SURGERY     1994    radial head Right 01/14/2016   at [place] Right 01/26/2016   Procedure: RIGHT RADIAL HEAD REPLACEMENT ARTHROPLASTY WITH LIGAMENT REPAIRS;  Surgeon: Charlotte Crumb, MD;  Location:  [facility];  Service: Orthopedics;  Laterality: Right;  Right elbow raial head replacement with possible ligament repairs as needed    UPPER GASTROINTESTINAL ENDOSCOPY      Allergies  Allergen Reactions   Versed [Midazolam] Other (See Comments)    Stroke   Iodine Nausea And Vomiting    During a myelogram    Lipitor [Atorvastatin] Other (See Comments)    headaches   Zocor [Simvastatin] Other (See Comments)    Reports leg cramps w/ doses over '20mg'$  daily.   Propoxyphene Hcl Palpitations    darvon     Objective/Physical Exam Neurovascular status intact.  Incision healed.  No edema noted.  He continues to be pain and tenderness throughout the third TMT of the left foot  Radiographic Exam LT foot 02/11/2022:  Orthopedic hardware of the second and third TMT appear to be stable.  There is some possible modest improvement of the arthrodesis site.  Orthopedic hardware is intact and stable.  Assessment: 1. s/p second and third TMT arthrodesis left. DOS: 08/30/2021 2.  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  Patient was evaluated.   2.  Continue the external bone stimulator daily 3.  Patient continues to have pain and tenderness.  In order  to better visualize and inspect the surgical sites I did order CT with contrast left foot. 4. Will plan to contact patient after CT to discuss results and further treatment options which may include revisional arthrodesis of the third TMT 5.  Return to clinic 6 weeks for follow-up x-ray  Edrick Kins, DPM [facility]  Dr. Edrick Kins, DPM    [address], [place] [address]                Office [phone]  Fax [phone]

## 2022-02-13 ENCOUNTER — Ambulatory Visit
Admission: RE | Admit: 2022-02-13 | Discharge: 2022-02-13 | Disposition: A | Discharge status: Home / Self Care | Payer: Medicare Other | Source: Ambulatory Visit | Attending: Podiatry | Admitting: Podiatry

## 2022-02-13 DIAGNOSIS — M960 Pseudarthrosis after fusion or arthrodesis: Secondary | ICD-10-CM

## 2022-02-13 MED ORDER — IOPAMIDOL (ISOVUE-300) INJECTION 61%
100.0000 mL | Freq: Once | INTRAVENOUS | Status: AC | PRN
  Administered 2022-02-13: 100 mL via INTRAVENOUS

## 2022-02-15 ENCOUNTER — Ambulatory Visit (INDEPENDENT_AMBULATORY_CARE_PROVIDER_SITE_OTHER): Payer: Medicare Other | Admitting: Podiatry

## 2022-02-15 VITALS — BP 130/58

## 2022-02-15 DIAGNOSIS — M960 Pseudarthrosis after fusion or arthrodesis: Secondary | ICD-10-CM | POA: Diagnosis not present

## 2022-02-15 NOTE — Progress Notes (Signed)
Chief Complaint  Patient presents with   Foot Pain    CT results     Subjective:  Patient presents today status post second and third TMT arthrodesis left foot. DOS: 08/30/2021.  Patient continues to have pain and tenderness to the nonunion area.  Patient has been using the Exogen Bone Stimulator and started using it beginning 01/21/2023.  Patient is very frustrated due to the pain and tenderness she is experiencing.  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   Neuromuscular disorder ([place])    Pre-diabetes    Prediabetes 11/10/2018   Shortness of breath dyspnea     Past Surgical History:  Procedure Laterality Date   ABDOMINAL HYSTERECTOMY     PARTIAL   ANTERIOR LAT LUMBAR FUSION N/A 08/14/2018   Procedure: extreme lateral interbody fusion - Lumbar two-Lumbar three  cortical screws and removal pedicle screws Lumbar -Sacral one;  Surgeon: Kary Kos, MD;  Location: [facility];  Service: Neurosurgery;  Laterality: N/A;   APPENDECTOMY     BACK SURGERY     times 4   CARPAL TUNNEL RELEASE     BIL    CESAREAN SECTION     PARTIAL HYST   CLOSED REDUCTION ULNAR SHAFT     COLONOSCOPY     FOOT SURGERY     LT FOOT CYST REMOVED    HARDWARE REMOVAL N/A 08/14/2018   Procedure: Removal pedicle screws Lumbar -Sacral one with cortical screw fixation lumbar two-three;  Surgeon: Kary Kos, MD;  Location: [place];  Service: Neurosurgery;  Laterality: N/A;   NASAL SINUS SURGERY     1994    radial head Right 01/14/2016   at [place] Right 01/26/2016   Procedure: RIGHT RADIAL HEAD REPLACEMENT ARTHROPLASTY WITH LIGAMENT REPAIRS;  Surgeon: Charlotte Crumb, MD;  Location: [facility];  Service: Orthopedics;  Laterality: Right;   Right elbow raial head replacement with possible ligament repairs as needed    UPPER GASTROINTESTINAL ENDOSCOPY      Allergies  Allergen Reactions   Versed [Midazolam] Other (See Comments)    Stroke   Iodine Nausea And Vomiting    During a myelogram    Lipitor [Atorvastatin] Other (See Comments)    headaches   Zocor [Simvastatin] Other (See Comments)    Reports leg cramps w/ doses over '20mg'$  daily.   Propoxyphene Hcl Palpitations    darvon     Objective/Physical Exam Neurovascular status intact.  Incision healed.  No edema noted.  He continues to be pain and tenderness throughout the third TMT of the left foot  Radiographic Exam LT foot 02/11/2022:  Orthopedic hardware of the second and third TMT appear to be stable.  There is some possible modest improvement of the arthrodesis site.  Orthopedic hardware is intact and stable.  CT FOOT LEFT W CONTRAST 02/13/2022: IMPRESSION: 1. Prior arthrodesis of the second TMT joint with solid osseous fusion across the joint space. 2. Failed arthrodesis of the third tarsometatarsal joint transfixed with a dorsal side plate without osseous bridging across the joint space. 3. Mild osteoarthritis of the fourth and fifth tarsometatarsal joints. 4. 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 Moderate osteoarthritis of  the navicular-middle cuneiform joint.  Assessment: 1. s/p second and third TMT arthrodesis left. DOS: 08/30/2021 2.  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  Patient was evaluated.  CT was reviewed with the patient today 2.  Continue the external bone stimulator daily 3.  Patient continues to have pain and tenderness localized to the third TMT nonunion site.  The patient is not symptomatic and does not have pain throughout the medial portion of the midfoot although MRI and CT findings demonstrate moderate to severe osteoarthritis of this  area.  Clinically she is asymptomatic.  Her pain is localized to the third TMT nonunion site 4.  Today we discussed in detail the nonunion and I do believe this is the patient's source of pain.  After lengthy discussion I do believe it would be in her best interest to revise the arthrodesis site of the third TMT.  This procedure was explained in detail including removal of the hardware, harvesting of bone allograft from the calcaneus, and revisional arthrodesis of the third TMT.  The surgery was explained as well as the postoperative recovery course.  Risk benefits advantages and disadvantages all explained in great detail with the patient.  All patient questions were answered.  No guarantees were expressed or implied.  The patient consents to proceed with surgery after tax season.  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 5.  Authorization for surgery was initiated today.  Surgery will consist of removal of orthopedic hardware third TMT left.  Revisional arthrodesis third TMT left.  Harvesting of bone autograft calcaneus left. 6.  Surgery tentatively planned for 05/23/2022.  In the meantime continue the Exogen bone stimulator.  Over the next 4 months this may actually improve the patient's symptoms and we may not need to proceed with the surgery potentially. 7.  Return to clinic 1 week postop  Edrick Kins, DPM [facility]  Dr. Edrick Kins, DPM    [address], [place] [address]                Office [phone]  Fax [phone]

## 2022-02-18 ENCOUNTER — Other Ambulatory Visit: Payer: Self-pay | Admitting: Podiatry

## 2022-02-18 DIAGNOSIS — M960 Pseudarthrosis after fusion or arthrodesis: Secondary | ICD-10-CM

## 2022-02-20 ENCOUNTER — Telehealth: Payer: Self-pay | Admitting: Podiatry

## 2022-02-20 NOTE — Telephone Encounter (Signed)
Pt called and has sent a my chart message for Dr Amalia Hailey to call her and I did explain he is out of the office until next week.  She is a surgical pt of his had surgery in August and it did not heal properly and they are discussing doing surgery again. 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  She is taking a lot of ibuprofen and is asking if something a little stronger to help her on the days that the pain is really bad. [facility] the [facility] [address] and [address]. She stated today when she got up she almost fell because the foot gave out and severe pain.

## 2022-02-21 MED ORDER — OXYCODONE-ACETAMINOPHEN 5-325 MG PO TABS: 1.0000 | ORAL_TABLET | ORAL | 0 refills | Status: DC | PRN

## 2022-02-21 NOTE — Telephone Encounter (Signed)
Notified pt that pain medication was sent to pharmacy. She said thank you so much. She does not need it all the time but there are days that she does. She said bless [place] and thank you again.

## 2022-02-21 NOTE — Addendum Note (Signed)
Addended by: Boneta Lucks on: 02/21/2022 08:30 AM   Modules accepted: Orders

## 2022-02-26 ENCOUNTER — Other Ambulatory Visit: Payer: Self-pay | Admitting: Neurology

## 2022-02-26 DIAGNOSIS — E782 Mixed hyperlipidemia: Secondary | ICD-10-CM

## 2022-02-26 DIAGNOSIS — E785 Hyperlipidemia, unspecified: Secondary | ICD-10-CM | POA: Insufficient documentation

## 2022-02-27 ENCOUNTER — Telehealth: Payer: Self-pay | Admitting: Pharmacy Technician

## 2022-02-27 ENCOUNTER — Other Ambulatory Visit: Payer: Self-pay | Admitting: Internal Medicine

## 2022-02-27 NOTE — Telephone Encounter (Signed)
Dr. Leonie Man, Juluis Rainier note:  Auth Submission: NO AUTH NEEDED Payer: MEDICARE A/B & [employer] Medication & CPT/J Code(s) submitted: Leqvio (Inclisiran) J1306 Route of submission (phone, fax, portal):  Phone [phone] Fax # Auth type: Buy/Bill Units/visits requested: 3 Reference number: [employer] ref: Dale-W @ 10a Approval from: 02/27/22 to 02/28/23   Patient will be scheduled as soon as possible

## 2022-03-06 ENCOUNTER — Ambulatory Visit (INDEPENDENT_AMBULATORY_CARE_PROVIDER_SITE_OTHER): Payer: Medicare Other | Admitting: Podiatry

## 2022-03-06 DIAGNOSIS — M960 Pseudarthrosis after fusion or arthrodesis: Secondary | ICD-10-CM | POA: Diagnosis not present

## 2022-03-06 NOTE — Progress Notes (Signed)
Chief Complaint  Patient presents with   Foot Pain    Patient came in today for a left foot pain follow-up, patient has been using the bone stimulator, rate of pain 10 out 10,     Subjective:  Patient presents today status post second and third TMT arthrodesis left foot. DOS: 08/30/2021.  Patient continues to have pain and tenderness to the nonunion area.  Patient has been using the Exogen Bone Stimulator and started using it beginning 01/21/2023.  Last visit CT scan results were discussed and reviewed.  She understands that the nonunion is causing the pain and she would benefit from revisional union of the third TMT.  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  They are very busy.  She is unable to pursue surgery at this time.  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   Neuromuscular disorder ([place])    Pre-diabetes    Prediabetes 11/10/2018   Shortness of breath dyspnea     Past Surgical History:  Procedure Laterality Date   ABDOMINAL HYSTERECTOMY     PARTIAL   ANTERIOR LAT LUMBAR FUSION N/A 08/14/2018   Procedure: extreme lateral interbody fusion - Lumbar two-Lumbar three  cortical screws and removal pedicle screws Lumbar -Sacral one;  Surgeon: Kary Kos, MD;  Location: [place];  Service: Neurosurgery;  Laterality: N/A;   APPENDECTOMY     BACK SURGERY     times 4   CARPAL TUNNEL RELEASE     BIL    CESAREAN SECTION     PARTIAL HYST   CLOSED REDUCTION ULNAR SHAFT     COLONOSCOPY     FOOT SURGERY     LT FOOT CYST REMOVED    HARDWARE REMOVAL N/A 08/14/2018   Procedure: Removal pedicle screws Lumbar -Sacral one with cortical screw fixation lumbar two-three;  Surgeon: Kary Kos, MD;   Location: [facility];  Service: Neurosurgery;  Laterality: N/A;   NASAL SINUS SURGERY     1994    radial head Right 01/14/2016   at [place] Right 01/26/2016   Procedure: RIGHT RADIAL HEAD REPLACEMENT ARTHROPLASTY WITH LIGAMENT REPAIRS;  Surgeon: Charlotte Crumb, MD;  Location: [place];  Service: Orthopedics;  Laterality: Right;  Right elbow raial head replacement with possible ligament repairs as needed    UPPER GASTROINTESTINAL ENDOSCOPY      Allergies  Allergen Reactions   Versed [Midazolam] Other (See Comments)    Stroke   Iodine Nausea And Vomiting    During a myelogram    Lipitor [Atorvastatin] Other (See Comments)    headaches   Zocor [Simvastatin] Other (See Comments)    Reports leg cramps w/ doses over 30m daily.   Propoxyphene Hcl Palpitations    darvon     Objective/Physical Exam Neurovascular status intact.  Incision healed.  No edema noted.  There continues to be pain and tenderness throughout the third TMT of the left foot  Radiographic Exam LT foot 02/11/2022:  Orthopedic hardware of the second and third TMT appear to be stable.  There is some possible modest improvement of the arthrodesis site.  Orthopedic hardware  is intact and stable.  CT FOOT LEFT W CONTRAST 02/13/2022: IMPRESSION: 1. Prior arthrodesis of the second TMT joint with solid osseous fusion across the joint space. 2. Failed arthrodesis of the third tarsometatarsal joint transfixed with a dorsal side plate without osseous bridging across the joint space. 3. Mild osteoarthritis of the fourth and fifth tarsometatarsal joints. 4. 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 Moderate osteoarthritis of the navicular-middle cuneiform joint.  Assessment: 1. s/p second and third TMT arthrodesis left. DOS: 08/30/2021 2.  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  Patient was  evaluated.   2.  Continue the external bone stimulator daily 3.  Authorization for surgery was initiated last visit on 02/15/2022.  Surgery will consist of removal of orthopedic hardware third TMT left.  Revisional arthrodesis third TMT left.  Harvesting of bone autograft calcaneus left. 4.  Surgery tentatively planned for 05/23/2022.  In the meantime continue the Exogen bone stimulator.  Over the next 4 months this may actually improve the patient's symptoms and we may not need to proceed with the surgery potentially. 5.  Refill prescription for Percocet 5/325 mg every 12 hours as needed pain.  #28.  No refills 6.  Return to clinic just prior to surgery for follow-up evaluation  Edrick Kins, DPM [facility]  Dr. Edrick Kins, DPM    [address], [place] [address]                Office [phone]  Fax [phone]

## 2022-03-08 ENCOUNTER — Telehealth: Payer: Self-pay | Admitting: Podiatry

## 2022-03-08 MED ORDER — OXYCODONE-ACETAMINOPHEN 5-325 MG PO TABS: 1.0000 | ORAL_TABLET | Freq: Two times a day (BID) | ORAL | 0 refills | Status: DC | PRN

## 2022-03-08 NOTE — Telephone Encounter (Signed)
Pt called in asking if her Rx has been called in yet.   Please advise

## 2022-03-08 NOTE — Telephone Encounter (Signed)
Yes.  Prescription sent in this morning.  Please notify patient.  Thanks, Dr. Amalia Hailey

## 2022-03-11 ENCOUNTER — Encounter: Payer: Self-pay | Admitting: Nurse Practitioner

## 2022-03-11 ENCOUNTER — Telehealth (INDEPENDENT_AMBULATORY_CARE_PROVIDER_SITE_OTHER): Payer: Medicare Other | Admitting: Nurse Practitioner

## 2022-03-11 VITALS — BP 130/70 | Ht 64.0 in | Wt 230.0 lb

## 2022-03-11 DIAGNOSIS — F329 Major depressive disorder, single episode, unspecified: Secondary | ICD-10-CM

## 2022-03-11 DIAGNOSIS — J0140 Acute pansinusitis, unspecified: Secondary | ICD-10-CM | POA: Insufficient documentation

## 2022-03-11 DIAGNOSIS — K760 Fatty (change of) liver, not elsewhere classified: Secondary | ICD-10-CM | POA: Insufficient documentation

## 2022-03-11 DIAGNOSIS — M79673 Pain in unspecified foot: Secondary | ICD-10-CM

## 2022-03-11 HISTORY — DX: Major depressive disorder, single episode, unspecified: F32.9

## 2022-03-11 MED ORDER — CEPHALEXIN 500 MG PO CAPS: 500.0000 mg | ORAL_CAPSULE | Freq: Three times a day (TID) | ORAL | 0 refills | Status: DC

## 2022-03-11 MED ORDER — PSEUDOEPH-BROMPHEN-DM 30-2-10 MG/5ML PO SYRP: 5.0000 mL | ORAL_SOLUTION | Freq: Three times a day (TID) | ORAL | 0 refills | Status: DC | PRN

## 2022-03-11 NOTE — Patient Instructions (Signed)
1. Continue taking Mucinex DM as needed for symptom relief.  2. I have prescribed an antibiotic that should be as effective as Augmentin without causing you as much queasiness. Please take this medication as directed.  3. A prescription for cough medicine has been sent to assist you at bedtime. This should help in drying things up and allowing you to rest more comfortably.  4. [facility] [address] and should be ready for pickup shortly.  5. Please let me know how things go with your foot.  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 Wishing you a speedy recovery and better health ahead.

## 2022-03-11 NOTE — Progress Notes (Signed)
Virtual Visit Encounter video and audio visit.   I connected with  Lauren Briggs on 03/11/22 at 12:00 PM EST by secure video and audio telemedicine application. I verified that I am speaking with the correct person using two identifiers.   I introduced myself as a Designer, jewellery with the practice. The limitations of evaluation and management by telemedicine discussed with the patient and the availability of in person appointments. The patient expressed verbal understanding and consent to proceed.  Participating parties in this visit include: Myself and patient  The patient is: Patient Location: Home I am: Provider Location: Office/Clinic Subjective:    CC and HPI: Lauren Briggs is a 67 y.o. year old female presenting for new evaluation and treatment of sinusitis. Patient reports the following:  Lauren Briggs presents today with a chief complaint of a persistent sinus infection that began approximately three and a half weeks ago. She has taken a COVID test to exclude the possibility of a COVID-19 infection. Lauren Briggs has been self-treating with Mucinex DM, having completed one box and is now halfway through her second box. She notes that strong antibiotics have previously been successful in resolving her sinus infections.  She also reports a history of foot surgery in August and a non-healing third metatarsal. Lauren Briggs is currently using a bone stimulator as recommended by Dr. Amalia Hailey, although she is skeptical about its efficacy. She has an upcoming surgery scheduled for March 21st, which will involve the removal of a staple and the placement of a metal bar and screw to fuse the bone. Lauren Briggs is contemplating getting a second opinion from Dr. Doran Durand at [facility] for alternative treatment options.  Past medical history, Surgical history, Family history not pertinant except as noted below, Social history, Allergies, and medications have been entered into the medical record, reviewed, and corrections  made.   Review of Systems:  All review of systems negative except what is listed in the HPI  Objective:    Alert and oriented x 4 She is audibly congested. Speaking in clear sentences with no shortness of breath. No distress.  Impression and Recommendations:    Problem List Items Addressed This Visit     Foot pain    Patient had foot surgery in August and is experiencing ongoing pain and nonunion of the third metatarsal. Currently using a bone stimulator as prescribed by Dr. Amalia Hailey. Surgery scheduled for March 21st to address the issue. Patient is seeking a second opinion from Dr. Doran Durand at [facility]. Plan: - Encourage the patient to continue with the bone stimulator as prescribed. - Support the patient in seeking a second opinion for alternative treatment options. - Monitor the patient's progress and pain levels, and provide appropriate pain management as needed.      Acute non-recurrent pansinusitis - Primary    Symptoms consistent with a sinus infection lasting for approximately 3.5 weeks. Increased mucous and sinus drainage, facial pain and pressure, headache, and cough present. Has been taking Mucinex DM with minimal relief. COVID test was negative. Plan: - Keflex sent to pharmacy to treat the sinus infection. - Cough suppressant and decongestant for nighttime use to help alleviate symptoms and improve sleep quality. - Use diflucan as needed for symptoms of yeast.       Relevant Medications   cephALEXin (KEFLEX) 500 MG capsule   brompheniramine-pseudoephedrine-DM 30-2-10 MG/5ML syrup    orders and follow up as documented in EMR I discussed the assessment and treatment plan with the patient. The patient was provided  an opportunity to ask questions and all were answered. The patient agreed with the plan and demonstrated an understanding of the instructions.   The patient was advised to call back or seek an in-person evaluation if the symptoms worsen or if the  condition fails to improve as anticipated.  Follow-Up: prn  I provided 25 minutes of non-face-to-face interaction with this non face-to-face encounter including intake, same-day documentation, and chart review.   Orma Render, NP , DNP, AGNP-c [facility]

## 2022-03-11 NOTE — Assessment & Plan Note (Signed)
Patient had foot surgery in August and is experiencing ongoing pain and nonunion of the third metatarsal. Currently using a bone stimulator as prescribed by Dr. Amalia Hailey. Surgery scheduled for March 21st to address the issue. Patient is seeking a second opinion from Dr. Doran Durand at [facility]. Plan: - Encourage the patient to continue with the bone stimulator as prescribed. - Support the patient in seeking a second opinion for alternative treatment options. - Monitor the patient's progress and pain levels, and provide appropriate pain management as needed.

## 2022-03-11 NOTE — Assessment & Plan Note (Signed)
Symptoms consistent with a sinus infection lasting for approximately 3.5 weeks. Increased mucous and sinus drainage, facial pain and pressure, headache, and cough present. Has been taking Mucinex DM with minimal relief. COVID test was negative. Plan: - Keflex sent to pharmacy to treat the sinus infection. - Cough suppressant and decongestant for nighttime use to help alleviate symptoms and improve sleep quality. - Use diflucan as needed for symptoms of yeast.

## 2022-03-22 ENCOUNTER — Ambulatory Visit (INDEPENDENT_AMBULATORY_CARE_PROVIDER_SITE_OTHER): Payer: Medicare Other | Admitting: Podiatry

## 2022-03-22 ENCOUNTER — Encounter: Payer: Self-pay | Admitting: Podiatry

## 2022-03-22 ENCOUNTER — Ambulatory Visit (INDEPENDENT_AMBULATORY_CARE_PROVIDER_SITE_OTHER): Payer: Medicare Other

## 2022-03-22 DIAGNOSIS — M960 Pseudarthrosis after fusion or arthrodesis: Secondary | ICD-10-CM

## 2022-03-22 MED ORDER — OXYCODONE-ACETAMINOPHEN 5-325 MG PO TABS: 1.0000 | ORAL_TABLET | Freq: Two times a day (BID) | ORAL | 0 refills | Status: DC | PRN

## 2022-03-22 NOTE — Progress Notes (Signed)
Chief Complaint  Patient presents with   Foot Pain    Follow up non-union left   "It hurts like crap"   Subjective:  Patient presents today status post second and third TMT arthrodesis left foot. DOS: 08/30/2021.  Patient subsequently developed nonunion of the third TMT of the surgical foot.  Patient has been using the Exogen Bone Stimulator and started using it beginning 01/21/2023.  Patient has had no relief with her symptoms and continues to have an antalgic gait with any kind of ambulation or activity.  She says it is painful on a daily basis.  Surgery for revisional third TMT arthrodesis was initiated last visit.  She presents today to further discuss surgery and move her surgical date up.  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   Neuromuscular disorder ([place])    Pre-diabetes    Prediabetes 11/10/2018   Shortness of breath dyspnea     Past Surgical History:  Procedure Laterality Date   ABDOMINAL HYSTERECTOMY     PARTIAL   ANTERIOR LAT LUMBAR FUSION N/A 08/14/2018   Procedure: extreme lateral interbody fusion - Lumbar two-Lumbar three  cortical screws and removal pedicle screws Lumbar -Sacral one;  Surgeon: Kary Kos, MD;  Location: [place];  Service: Neurosurgery;  Laterality: N/A;   APPENDECTOMY     BACK SURGERY     times 4   CARPAL TUNNEL RELEASE     BIL    CESAREAN SECTION     PARTIAL HYST   CLOSED REDUCTION ULNAR SHAFT     COLONOSCOPY     FOOT SURGERY     LT FOOT CYST REMOVED    HARDWARE REMOVAL N/A 08/14/2018   Procedure: Removal pedicle screws Lumbar -Sacral one with cortical screw fixation lumbar two-three;  Surgeon: Kary Kos, MD;  Location: [place];  Service: Neurosurgery;  Laterality: N/A;   NASAL SINUS SURGERY     1994     radial head Right 01/14/2016   at [place] Right 01/26/2016   Procedure: RIGHT RADIAL HEAD REPLACEMENT ARTHROPLASTY WITH LIGAMENT REPAIRS;  Surgeon: Charlotte Crumb, MD;  Location: [place];  Service: Orthopedics;  Laterality: Right;  Right elbow raial head replacement with possible ligament repairs as needed    UPPER GASTROINTESTINAL ENDOSCOPY      Allergies  Allergen Reactions   Versed [Midazolam] Other (See Comments)    Stroke   Iodine Nausea And Vomiting    During a myelogram    Lipitor [Atorvastatin] Other (See Comments)    headaches   Zocor [Simvastatin] Other (See Comments)    Reports leg cramps w/ doses over '20mg'$  daily.   Propoxyphene Hcl Palpitations    darvon     Objective/Physical Exam Unchanged since last visit.  Neurovascular status intact.  Incision healed.  No edema noted.  There continues to be pain and tenderness throughout the third TMT of the left foot  Radiographic Exam LT foot 03/22/2022:  Mostly unchanged since prior visit orthopedic hardware of the second and third TMT appear to be stable.  There continues to be nonunion of the arthrodesis site third TMT  CT FOOT LEFT W CONTRAST 02/13/2022: IMPRESSION: 1. Prior arthrodesis  of the second TMT joint with solid osseous fusion across the joint space. 2. Failed arthrodesis of the third tarsometatarsal joint transfixed with a dorsal side plate without osseous bridging across the joint space. 3. Mild osteoarthritis of the fourth and fifth tarsometatarsal joints. 4. 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 Moderate osteoarthritis of the navicular-middle cuneiform joint.  Assessment: 1. s/p second and third TMT arthrodesis left. DOS: 08/30/2021 2.  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  Patient was evaluated.   2.  3.  Authorization for surgery was initiated last visit on  02/15/2022.  Surgery will consist of removal of orthopedic hardware third TMT left.  Revisional arthrodesis third TMT left.  Harvesting of bone autograft calcaneus left. 4.  Surgical date was moved up today.  Patient states that she is not able to wait until 05/23/2022 to have her surgery.  She would like to have it performed soon as possible  5.  Clinically the patient is asymptomatic around the navicular medial cuneiform joint as well as the navicular middle cuneiform.  I do not believe this is the patient's source or etiology of the pain she is experiencing.  There is also no pain on palpation around the fourth and fifth TMT.  Pain seems to be directly related to palpation of the third TMT 6.  Return to clinic 1 week postop  Edrick Kins, DPM [facility]  Dr. Edrick Kins, DPM    [address], [place] [address]                Office [phone]  Fax [phone]

## 2022-03-28 ENCOUNTER — Other Ambulatory Visit (HOSPITAL_BASED_OUTPATIENT_CLINIC_OR_DEPARTMENT_OTHER): Payer: Self-pay | Admitting: Nurse Practitioner

## 2022-04-02 ENCOUNTER — Telehealth: Payer: Medicare Other | Admitting: Family Medicine

## 2022-04-02 DIAGNOSIS — R0981 Nasal congestion: Secondary | ICD-10-CM

## 2022-04-02 DIAGNOSIS — R0982 Postnasal drip: Secondary | ICD-10-CM | POA: Diagnosis not present

## 2022-04-02 MED ORDER — FLUTICASONE PROPIONATE 50 MCG/ACT NA SUSP: 2.0000 | Freq: Every day | NASAL | 0 refills | Status: DC

## 2022-04-02 MED ORDER — PSEUDOEPH-BROMPHEN-DM 30-2-10 MG/5ML PO SYRP: 5.0000 mL | ORAL_SOLUTION | Freq: Four times a day (QID) | ORAL | 0 refills | Status: DC | PRN

## 2022-04-02 NOTE — Patient Instructions (Signed)
Lauren Briggs, thank you for joining [name], NP for today's virtual visit.  While this provider is not your primary care provider (PCP), if your PCP is located in our provider database this encounter information will be shared with them immediately following your visit.   [facility] account gives you access to today's visit and all your visits, tests, and labs performed at [facility] " click here if you don't have a [facility] account or go to [email or web address]  Consent: (Patient) Lauren Briggs provided verbal consent for this virtual visit at the beginning of the encounter.  Current Medications:  Current Outpatient Medications:    brompheniramine-pseudoephedrine-DM 30-2-10 MG/5ML syrup, Take 5 mLs by mouth 4 (four) times daily as needed., Disp: 120 mL, Rfl: 0   fluticasone (FLONASE) 50 MCG/ACT nasal spray, Place 2 sprays into both nostrils daily., Disp: 16 g, Rfl: 0   aspirin EC 81 MG tablet, Take 1 tablet (81 mg total) by mouth daily. Swallow whole., Disp: 30 tablet, Rfl: 12   betamethasone valerate ointment (VALISONE) 0.1 %, SMARTSIG:sparingly Topical Twice Daily PRN, Disp: , Rfl:    Coenzyme Q10 (COQ-10) 100 MG CAPS, Take 100 mg by mouth in the morning and at bedtime. otc, Disp: 30 capsule, Rfl: 0   escitalopram (LEXAPRO) 10 MG tablet, Take 1 tablet (10 mg total) by mouth at bedtime., Disp: 90 tablet, Rfl: 3   estradiol (ESTRACE) 1 MG tablet, Take 1 mg by mouth as directed. 1 tab every other day, Disp: , Rfl:    ezetimibe (ZETIA) 10 MG tablet, TAKE 1 TABLET(10 MG) BY MOUTH DAILY, Disp: 90 tablet, Rfl: 1   fenofibrate micronized (LOFIBRA) 134 MG capsule, TAKE 1 CAPSULE(134 MG) BY MOUTH DAILY BEFORE BREAKFAST, Disp: 30 capsule, Rfl: 2   fluconazole (DIFLUCAN) 150 MG tablet, Take one tablet by mouth at the first sign of symptoms of yeast. If no resolution, repeat dose in 72 hours. (Patient not taking: Reported on 03/11/2022), Disp: 2 tablet, Rfl:  2   lisinopril-hydrochlorothiazide (ZESTORETIC) 20-12.5 MG tablet, Take 1 tablet by mouth daily., Disp: 90 tablet, Rfl: 3   oxyCODONE-acetaminophen (PERCOCET) 5-325 MG tablet, Take 1 tablet by mouth every 12 (twelve) hours as needed for severe pain., Disp: 28 tablet, Rfl: 0   phenazopyridine (PYRIDIUM) 100 MG tablet, Take 1 tablet (100 mg total) by mouth 3 (three) times daily as needed for pain., Disp: 15 tablet, Rfl: 3   propranolol (INDERAL) 20 MG tablet, Take 1 tablet (20 mg total) by mouth 3 (three) times daily as needed., Disp: 270 tablet, Rfl: 3   traZODone (DESYREL) 50 MG tablet, Take 1 tablet (50 mg total) by mouth at bedtime., Disp: 90 tablet, Rfl: 3   Medications ordered in this encounter:  Meds ordered this encounter  Medications   brompheniramine-pseudoephedrine-DM 30-2-10 MG/5ML syrup    Sig: Take 5 mLs by mouth 4 (four) times daily as needed.    Dispense:  120 mL    Refill:  0    Order Specific Question:   Supervising Provider    Answer:   Chase Picket [id number]   fluticasone (FLONASE) 50 MCG/ACT nasal spray    Sig: Place 2 sprays into both nostrils daily.    Dispense:  16 g    Refill:  0    Order Specific Question:   Supervising Provider    Answer:   Chase Picket [id number]     *If you need refills on other medications prior to  your next appointment, please contact your pharmacy*  Follow-Up: Call back or seek an in-person evaluation if the symptoms worsen or if the condition fails to improve as anticipated.  [facility] [phone]  Other Instructions Postnasal Drip Postnasal drip is the feeling of mucus going down the back of your throat. Mucus is a slimy substance that moistens and cleans your nose and throat, as well as the air pockets in face bones near your forehead and cheeks (sinuses). Small amounts of mucus pass from your nose and sinuses down the back of your throat all the time. This is normal. When you produce too much mucus or the  mucus gets too thick, you can feel it. Some common causes of postnasal drip include: Having more mucus because of: A cold or the flu. Allergies. Cold air. Certain medicines. Gastroesophageal reflux. Having more mucus that is thicker because of: A sinus or nasal infection. Dry air. A food allergy. Follow these instructions at home: Relieving discomfort  Gargle with a mixture of salt and water 3-4 times a day or as needed. To make salt water, completely dissolve -1 tsp (3-6 g) of salt in 1 cup (237 mL) of warm water. If the air in your home is dry, use a humidifier to add moisture to the air. Use a saline spray or a container (neti pot) to flush out the nose (nasal irrigation). These methods can help clear away mucus and keep the nasal passages moist. General instructions Take over-the-counter and prescription medicines only as told by your health care provider. Follow instructions from your health care provider about eating or drinking restrictions. You may need to avoid caffeine. Avoid things that you know you are allergic to (allergens), like dust, mold, pollen, pets, or certain foods. Drink enough fluid to keep your urine pale yellow. Keep all follow-up visits. This is important. Contact a health care provider if: You have a fever. You have a sore throat or difficulty swallowing. You have a headache. You have sinus or ear pain. You have a cough that does not go away. The mucus from your nose becomes thick and is green or yellow in color. You have cold or flu symptoms that last more than 10 days. Summary Postnasal drip is the feeling of mucus going down the back of your throat. Use nasal irrigation or a nasal spray to help clear away mucus and keep the nasal passages moist. Avoid things that you know you are allergic to (allergens), like dust, mold, pollen, pets, or certain foods. This information is not intended to replace advice given to you by your health care provider. Make  sure you discuss any questions you have with your health care provider. Document Revised: 12/07/2020 Document Reviewed: 12/07/2020 Elsevier Patient Education  [place].    If you have been instructed to have an in-person evaluation today at a local Urgent Care facility, please use the link below. It will take you to a list of all of our available [facility], including address, phone number and hours of operation. Please do not delay care.  [facility]  If you or a family member do not have a primary care provider, use the link below to schedule a visit and establish care. When you choose a [facility] primary care physician or advanced practice provider, you gain a long-term partner in health. Find a Primary Care Provider  Learn more about [facility]'s in-office and virtual care options: [facility] -  Get Care Now

## 2022-04-02 NOTE — Progress Notes (Signed)
Virtual Visit Consent   Lauren Briggs, you are scheduled for a virtual visit with a [facility] provider today. Just as with appointments in the office, your consent must be obtained to participate. Your consent will be active for this visit and any virtual visit you may have with one of our providers in the next 365 days. If you have a MyChart account, a copy of this consent can be sent to you electronically.  As this is a virtual visit, video technology does not allow for your provider to perform a traditional examination. This may limit your provider's ability to fully assess your condition. If your provider identifies any concerns that need to be evaluated in person or the need to arrange testing (such as labs, EKG, etc.), we will make arrangements to do so. Although advances in technology are sophisticated, we cannot ensure that it will always work on either your end or our end. If the connection with a video visit is poor, the visit may have to be switched to a telephone visit. With either a video or telephone visit, we are not always able to ensure that we have a secure connection.  By engaging in this virtual visit, you consent to the provision of healthcare and authorize for your insurance to be billed (if applicable) for the services provided during this visit. Depending on your insurance coverage, you may receive a charge related to this service.  I need to obtain your verbal consent now. Are you willing to proceed with your visit today? Lauren Briggs has provided verbal consent on 04/02/2022 for a virtual visit (video or telephone). [name], NP  Date: 04/02/2022 11:37 AM  Virtual Visit via Video Note   I, [name], connected with  Lauren Briggs  ([id number], [birth year]) on 04/02/22 at 11:30 AM EDT by a video-enabled telemedicine application and verified that I am speaking with the correct person using two identifiers.  Location: Patient: Virtual Visit Location Patient:  Home Provider: Virtual Visit Location Provider: Home Office   I discussed the limitations of evaluation and management by telemedicine and the availability of in person appointments. The patient expressed understanding and agreed to proceed.    History of Present Illness: Lauren Briggs is a 67 y.o. who identifies as a female who was assigned female at birth, and is being seen today for possible allergies/congestion.  Was treated for sinus infection 03/11/22 with Keflex- felt better but in last several days- 2 days she noted and increased in cough and cold symptoms. Note some allergies at times in past. Has foot surgery next week and wants to be better by then. Has not tried OTC antihistamines, was thinking she might need another antibiotic.   Reports she has some congestion, post nasal drip, cough. Denies chest pain, SHOB, fevers or chills.   Problems:  Patient Active Problem List   Diagnosis Date Noted   Fatty liver 03/11/2022   Reactive depression (situational) 03/11/2022   Acute non-recurrent pansinusitis 03/11/2022   Hyperlipidemia 02/26/2022   Other specified anxiety disorders 03/25/2021   Depression, major, single episode, severe ([place]) 03/25/2021   Adjustment disorder with mixed anxiety and depressed mood 03/19/2021   History of ischemic stroke 10/03/2020   Body mass index (BMI) 40.0-44.9, adult ([place]) 09/07/2020   Foot pain 02/16/2019   Left leg numbness 01/21/2019   Neuropathy 12/31/2018   Family history of heart disease 12/02/2018   Prediabetes 11/10/2018   Essential hypertension 11/09/2018   DDD (degenerative disc  disease), lumbosacral 11/03/2018   Spondylosis without myelopathy or radiculopathy, lumbar region 11/03/2018   Spinal stenosis of lumbar region 08/14/2018   Closed displaced fracture of head of right radius 01/25/2016   Greater trochanteric pain syndrome 02/23/2015   Pseudoarthrosis of lumbar spine 07/11/2014   Displacement of lumbar intervertebral disc without  myelopathy 07/14/2012   Mixed hypercholesterolemia and 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   Low back pain radiating to right leg    Lichen sclerosus [id number]   Postmenopausal HRT (hormone replacement therapy) 04/29/2011    Allergies:  Allergies  Allergen Reactions   Versed [Midazolam] Other (See Comments)    Stroke   Iodine Nausea And Vomiting    During a myelogram    Lipitor [Atorvastatin] Other (See Comments)    headaches   Zocor [Simvastatin] Other (See Comments)    Reports leg cramps w/ doses over '20mg'$  daily.   Propoxyphene Hcl Palpitations    darvon    Medications:  Current Outpatient Medications:    aspirin EC 81 MG tablet, Take 1 tablet (81 mg total) by mouth daily. Swallow whole., Disp: 30 tablet, Rfl: 12   betamethasone valerate ointment (VALISONE) 0.1 %, SMARTSIG:sparingly Topical Twice Daily PRN, Disp: , Rfl:    brompheniramine-pseudoephedrine-DM 30-2-10 MG/5ML syrup, Take 5 mLs by mouth 3 (three) times daily as needed., Disp: 120 mL, Rfl: 0   Coenzyme Q10 (COQ-10) 100 MG CAPS, Take 100 mg by mouth in the morning and at bedtime. otc, Disp: 30 capsule, Rfl: 0   escitalopram (LEXAPRO) 10 MG tablet, Take 1 tablet (10 mg total) by mouth at bedtime., Disp: 90 tablet, Rfl: 3   estradiol (ESTRACE) 1 MG tablet, Take 1 mg by mouth as directed. 1 tab every other day, Disp: , Rfl:    ezetimibe (ZETIA) 10 MG tablet, TAKE 1 TABLET(10 MG) BY MOUTH DAILY, Disp: 90 tablet, Rfl: 1   fenofibrate micronized (LOFIBRA) 134 MG capsule, TAKE 1 CAPSULE(134 MG) BY MOUTH DAILY BEFORE BREAKFAST, Disp: 30 capsule, Rfl: 2   fluconazole (DIFLUCAN) 150 MG tablet, Take one tablet by mouth at the first sign of symptoms of yeast. If no resolution, repeat dose in 72 hours. (Patient not taking: Reported on 03/11/2022), Disp: 2 tablet, Rfl: 2   lisinopril-hydrochlorothiazide (ZESTORETIC) 20-12.5 MG tablet, Take 1 tablet by mouth daily., Disp: 90 tablet, Rfl: 3   oxyCODONE-acetaminophen (PERCOCET) 5-325 MG  tablet, Take 1 tablet by mouth every 12 (twelve) hours as needed for severe pain., Disp: 28 tablet, Rfl: 0   phenazopyridine (PYRIDIUM) 100 MG tablet, Take 1 tablet (100 mg total) by mouth 3 (three) times daily as needed for pain., Disp: 15 tablet, Rfl: 3   propranolol (INDERAL) 20 MG tablet, Take 1 tablet (20 mg total) by mouth 3 (three) times daily as needed., Disp: 270 tablet, Rfl: 3   traZODone (DESYREL) 50 MG tablet, Take 1 tablet (50 mg total) by mouth at bedtime., Disp: 90 tablet, Rfl: 3  Observations/Objective: Patient is well-developed, well-nourished in no acute distress.  Resting comfortably  at home.  Head is normocephalic, atraumatic.  No labored breathing.  Speech is clear and coherent with logical content.  Patient is alert and oriented at baseline.    Assessment and Plan:  1. Post-nasal drip  - brompheniramine-pseudoephedrine-DM 30-2-10 MG/5ML syrup; Take 5 mLs by mouth 4 (four) times daily as needed.  Dispense: 120 mL; Refill: 0 - fluticasone (FLONASE) 50 MCG/ACT nasal spray; Place 2 sprays into both nostrils daily.  Dispense: 16 g; Refill: 0  2. Congestion of nasal  sinus  - brompheniramine-pseudoephedrine-DM 30-2-10 MG/5ML syrup; Take 5 mLs by mouth 4 (four) times daily as needed.  Dispense: 120 mL; Refill: 0 - fluticasone (FLONASE) 50 MCG/ACT nasal spray; Place 2 sprays into both nostrils daily.  Dispense: 16 g; Refill: 0  -most likely PND related to allergy season starting, causing increase in congestion and cough will treat as such given recent ABX last month, rather refrain if able.   Reviewed side effects, risks and benefits of medication.    Patient acknowledged agreement and understanding of the plan.   Past Medical, Surgical, Social History, Allergies, and Medications have been Reviewed.   Follow Up Instructions: I discussed the assessment and treatment plan with the patient. The patient was provided an opportunity to ask questions and all were answered.  The patient agreed with the plan and demonstrated an understanding of the instructions.  A copy of instructions were sent to the patient via MyChart unless otherwise noted below.    The patient was advised to call back or seek an in-person evaluation if the symptoms worsen or if the condition fails to improve as anticipated.  Time:  I spent 10 minutes with the patient via telehealth technology discussing the above problems/concerns.    [name], NP

## 2022-04-05 ENCOUNTER — Encounter: Payer: Self-pay | Admitting: Nurse Practitioner

## 2022-04-05 ENCOUNTER — Telehealth (INDEPENDENT_AMBULATORY_CARE_PROVIDER_SITE_OTHER): Payer: Medicare Other | Admitting: Nurse Practitioner

## 2022-04-05 VITALS — Wt 230.0 lb

## 2022-04-05 DIAGNOSIS — J0140 Acute pansinusitis, unspecified: Secondary | ICD-10-CM

## 2022-04-05 DIAGNOSIS — R3915 Urgency of urination: Secondary | ICD-10-CM

## 2022-04-05 MED ORDER — DOXYCYCLINE HYCLATE 100 MG PO TABS: 100.0000 mg | ORAL_TABLET | Freq: Two times a day (BID) | ORAL | 0 refills | Status: DC

## 2022-04-05 MED ORDER — OXYBUTYNIN CHLORIDE ER 15 MG PO TB24: 15.0000 mg | ORAL_TABLET | Freq: Every day | ORAL | 12 refills | Status: DC

## 2022-04-05 NOTE — Patient Instructions (Addendum)
I have sent in a medication called ditropan to see if this helps with the urinary urgency. Give this a few weeks to see how this works for you.   I have also sent in doxycycline for you. Use for 3-5 days to help clear any infection in the sinuses. You can stop when the green symptoms resolve. Keep taking the zyrtec once a day. You may want to consider switching to Xyzal at bedtime, this can help dry the mucous up. You may also want to look into using a nasal spray like flonase to help with inflammation and drainage. Flonase has a flowery taste, but is the best to dry up the nasal drainage.   Good luck in surgery!!  And dont push yourself too hard or too fast to recover :-)

## 2022-04-05 NOTE — Progress Notes (Signed)
Virtual Visit Encounter video and audio visit.   I connected with  Lauren Briggs on 04/10/22 at  8:45 AM EDT by secure video and audio telemedicine application. I verified that I am speaking with the correct person using two identifiers.   I introduced myself as a Publishing rights manager with the practice. The limitations of evaluation and management by telemedicine discussed with the patient and the availability of in person appointments. The patient expressed verbal understanding and consent to proceed.  Participating parties in this visit include: Myself and patient  The patient is: Patient Location: Home I am: Provider Location: Office/Clinic Subjective:    CC and HPI: Lauren Briggs is a 67 y.o. year old female presenting for new evaluation and treatment of Sinus and Urinary Urgency. Patient reports the following:  Judythe presents today with chief complaints of urinary urgency and dribbling, alongside sinus pressure and congestion.  She describes her primary concern as seeking a medication to help alleviate the symptoms of urinary urgency and dribbling. The patient reports having tried several different medications without success and expresses a specific interest in finding a medication that is covered by her insurance provider, [employer].  In addition to her urinary symptoms, the patient also complains of sinus pressure and congestion. She believes these symptoms may be related to a recent course of antibiotics. The patient is concerned about the potential impact of these symptoms on her upcoming surgery, which is scheduled for Thursday. She has no known allergies to medications and has not taken doxycycline before.  Past medical history, Surgical history, Family history not pertinant except as noted below, Social history, Allergies, and medications have been entered into the medical record, reviewed, and corrections made.   Review of Systems:  All review of systems negative except what is  listed in the HPI  Objective:    Alert and oriented x 4 Speaking in clear sentences with no shortness of breath. No distress.  Impression and Recommendations:    Problem List Items Addressed This Visit     Acute non-recurrent pansinusitis - Primary    Destynie reports experiencing sinus pressure and related symptoms, indicating a possible sinus infection. These are likely stemming from allergy exacerbation and weather changes. Seeing that she is going for surgery next week, I feel a short course of antibiotics is appropriate to mitigate the infection without affecting the upcoming procedure. Plan: -  Doxycycline, to be taken twice daily for 5 days, ensuring completion before her surgery date. - Consider consultation with an ENT specialist to further evaluate her sinus issues, considering the possibility of polyps or other underlying causes contributing to frequent sinusitis.       Relevant Medications   cetirizine (ZYRTEC) 10 MG tablet   doxycycline (VIBRA-TABS) 100 MG tablet   Urinary urgency    The patient presents with symptoms of urinary urgency and occasional incontinence, significantly impacting her quality of life. We discussed the option to trial medication for this and she is interested. It appears myrbetriq is not covered under her plan, but ditropan is.  Plan: - Start ditropan and monitor for effectiveness and tolerability. - Give the medication a couple of weeks to assess its impact on her symptoms. - Report back if there is no improvement or if you experiences any adverse effects.      Relevant Medications   oxybutynin (DITROPAN XL) 15 MG 24 hr tablet   doxycycline (VIBRA-TABS) 100 MG tablet    orders and follow up as documented in EMR I  discussed the assessment and treatment plan with the patient. The patient was provided an opportunity to ask questions and all were answered. The patient agreed with the plan and demonstrated an understanding of the instructions.   The  patient was advised to call back or seek an in-person evaluation if the symptoms worsen or if the condition fails to improve as anticipated.  Follow-Up: prn  I provided 22 minutes of non-face-to-face interaction with this non face-to-face encounter including intake, same-day documentation, and chart review.   Tollie Eth, NP , DNP, AGNP-c [facility]

## 2022-04-09 ENCOUNTER — Encounter: Payer: Self-pay | Admitting: Neurology

## 2022-04-10 DIAGNOSIS — R3915 Urgency of urination: Secondary | ICD-10-CM | POA: Insufficient documentation

## 2022-04-10 HISTORY — DX: Urgency of urination: R39.15

## 2022-04-10 NOTE — Assessment & Plan Note (Signed)
Lauren Briggs reports experiencing sinus pressure and related symptoms, indicating a possible sinus infection. These are likely stemming from allergy exacerbation and weather changes. Seeing that she is going for surgery next week, I feel a short course of antibiotics is appropriate to mitigate the infection without affecting the upcoming procedure. Plan: -  Doxycycline, to be taken twice daily for 5 days, ensuring completion before her surgery date. - Consider consultation with an ENT specialist to further evaluate her sinus issues, considering the possibility of polyps or other underlying causes contributing to frequent sinusitis.

## 2022-04-10 NOTE — Assessment & Plan Note (Signed)
The patient presents with symptoms of urinary urgency and occasional incontinence, significantly impacting her quality of life. We discussed the option to trial medication for this and she is interested. It appears myrbetriq is not covered under her plan, but ditropan is.  Plan: - Start ditropan and monitor for effectiveness and tolerability. - Give the medication a couple of weeks to assess its impact on her symptoms. - Report back if there is no improvement or if you experiences any adverse effects.

## 2022-04-11 ENCOUNTER — Other Ambulatory Visit: Payer: Self-pay | Admitting: Podiatry

## 2022-04-11 ENCOUNTER — Encounter: Payer: Self-pay | Admitting: Podiatry

## 2022-04-11 DIAGNOSIS — Z4889 Encounter for other specified surgical aftercare: Secondary | ICD-10-CM | POA: Diagnosis not present

## 2022-04-11 DIAGNOSIS — M960 Pseudarthrosis after fusion or arthrodesis: Secondary | ICD-10-CM | POA: Diagnosis not present

## 2022-04-11 DIAGNOSIS — M89772 Major osseous defect, left ankle and foot: Secondary | ICD-10-CM | POA: Diagnosis not present

## 2022-04-11 HISTORY — PX: FOOT SURGERY: SHX648

## 2022-04-11 MED ORDER — OXYCODONE-ACETAMINOPHEN 5-325 MG PO TABS: 1.0000 | ORAL_TABLET | ORAL | 0 refills | Status: DC | PRN

## 2022-04-11 NOTE — Progress Notes (Signed)
PRN postop 

## 2022-04-13 ENCOUNTER — Encounter: Payer: Self-pay | Admitting: Nurse Practitioner

## 2022-04-15 ENCOUNTER — Ambulatory Visit: Payer: Medicare Other

## 2022-04-15 ENCOUNTER — Ambulatory Visit (INDEPENDENT_AMBULATORY_CARE_PROVIDER_SITE_OTHER): Payer: Medicare Other | Admitting: Podiatry

## 2022-04-15 DIAGNOSIS — M960 Pseudarthrosis after fusion or arthrodesis: Secondary | ICD-10-CM

## 2022-04-15 DIAGNOSIS — Z9889 Other specified postprocedural states: Secondary | ICD-10-CM

## 2022-04-16 ENCOUNTER — Other Ambulatory Visit: Payer: Self-pay | Admitting: Podiatry

## 2022-04-16 MED ORDER — OXYCODONE-ACETAMINOPHEN 5-325 MG PO TABS: 1.0000 | ORAL_TABLET | Freq: Four times a day (QID) | ORAL | 0 refills | Status: DC | PRN

## 2022-04-17 ENCOUNTER — Encounter: Payer: Medicare Other | Admitting: Podiatry

## 2022-04-17 NOTE — Progress Notes (Signed)
Chief Complaint  Patient presents with   Routine Post Op    POV # 1 DOS 04/11/22 --- REVISIONAL ARTHRODESIS 3RD TARSOMETATARSAL JOINT LEFT, REMOVAL OF ORTHOPEDIC HARDWARE 3RD TARSOMETATARSAL LEFT, HARVESTING OF BONE AUTOGRAFT LEFT CALCANEUS, patient denies any pain, No N/V/F/C/SOB, patient has some swelling, X-rays done today     Subjective:  Patient presents today status post revisional third TMT arthrodesis with harvesting of bone autograft left calcaneus.  DOS: 04/11/2022.  Patient doing well.  She is strictly nonweightbearing in the cam boot as instructed.  Dressings clean dry and intact.  No new complaints at this time  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   Neuromuscular disorder ([place])    Pre-diabetes    Prediabetes 11/10/2018   Shortness of breath dyspnea     Past Surgical History:  Procedure Laterality Date   ABDOMINAL HYSTERECTOMY     PARTIAL   ANTERIOR LAT LUMBAR FUSION N/A 08/14/2018   Procedure: extreme lateral interbody fusion - Lumbar two-Lumbar three  cortical screws and removal pedicle screws Lumbar -Sacral one;  Surgeon: Kary Kos, MD;  Location: [facility];  Service: Neurosurgery;  Laterality: N/A;   APPENDECTOMY     BACK SURGERY     times 4   CARPAL TUNNEL RELEASE     BIL    CESAREAN SECTION     PARTIAL HYST   CLOSED REDUCTION ULNAR SHAFT     COLONOSCOPY     FOOT SURGERY     LT FOOT CYST REMOVED    HARDWARE REMOVAL N/A 08/14/2018   Procedure: Removal pedicle screws Lumbar -Sacral one with cortical screw fixation lumbar two-three;  Surgeon: Kary Kos, MD;  Location: [facility];  Service: Neurosurgery;  Laterality: N/A;   NASAL SINUS SURGERY     1994    radial head Right 01/14/2016   at [place] Right 01/26/2016   Procedure: RIGHT RADIAL HEAD REPLACEMENT ARTHROPLASTY WITH LIGAMENT REPAIRS;  Surgeon: Charlotte Crumb, MD;  Location: [facility];  Service: Orthopedics;  Laterality: Right;  Right elbow raial head replacement with possible ligament repairs as needed    UPPER GASTROINTESTINAL ENDOSCOPY      Allergies  Allergen Reactions   Versed [Midazolam] Other (See Comments)    Stroke   Iodine Nausea And Vomiting    During a myelogram    Lipitor [Atorvastatin] Other (See Comments)    headaches   Zocor [Simvastatin] Other (See Comments)    Reports leg cramps w/ doses over 20mg  daily.   Propoxyphene Hcl Palpitations    darvon     Objective/Physical Exam Neurovascular status intact.  Incision well coapted with sutures intact. No sign of infectious process noted. No dehiscence. No active bleeding noted.  Moderate edema noted to the surgical extremity.  Radiographic Exam LT foot 04/17/2022:  Orthopedic stable noted crossing the second TMT which appears stable with routine healing.  Plate and screw fixation of the third TMT noted which is in good anatomical alignment.  Radiolucency throughout the calcaneus noted from the harvesting of the bone autograft.  No acute fractures identified.  Assessment: 1. s/p third TMT revisional arthrodesis left with harvesting of bone autograft left calcaneus.  DOS: 04/11/2022   Plan of Care:  1. Patient was evaluated. X-rays reviewed 2.  Dressings changed.  Clean dry and intact x 1 week 3.  Continue strict nonweightbearing in the cam boot with the assistance of the knee scooter 4.  Return to clinic 1 week possible suture removal   Edrick Kins, DPM [facility]  Dr. Edrick Kins, DPM    [address], [place] [address]                Office [phone]  Fax [phone]

## 2022-04-18 ENCOUNTER — Telehealth: Payer: Self-pay | Admitting: Nurse Practitioner

## 2022-04-18 NOTE — Telephone Encounter (Signed)
Contacted [name] to schedule their annual wellness visit. Appointment made for 05/14/22.  Barkley Boards AWV direct phone # [phone]

## 2022-04-23 ENCOUNTER — Other Ambulatory Visit: Payer: Self-pay | Admitting: Podiatry

## 2022-04-24 ENCOUNTER — Ambulatory Visit (INDEPENDENT_AMBULATORY_CARE_PROVIDER_SITE_OTHER): Payer: Medicare Other | Admitting: Podiatry

## 2022-04-24 DIAGNOSIS — Z9889 Other specified postprocedural states: Secondary | ICD-10-CM

## 2022-04-24 MED ORDER — OXYCODONE-ACETAMINOPHEN 10-325 MG PO TABS: 1.0000 | ORAL_TABLET | Freq: Four times a day (QID) | ORAL | 0 refills | Status: DC | PRN

## 2022-04-24 NOTE — Progress Notes (Signed)
Chief Complaint  Patient presents with   Routine Post Op    POV # 2 DOS 04/11/22 --- REVISIONAL ARTHRODESIS 3RD TARSOMETATARSAL JOINT LEFT, REMOVAL OF ORTHOPEDIC HARDWARE 3RD TARSOMETATARSAL LEFT, HARVESTING OF BONE AUTOGRAFT LEFT CALCANEUS, patient is having some pain, rate of pain 4 out of 10, Sutures were removed today     Subjective:  Patient presents today status post revisional third TMT arthrodesis with harvesting of bone autograft left calcaneus.  DOS: 04/11/2022.  Patient continues to do well.  She has been nonweightbearing in the cam boot.  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   Neuromuscular disorder ([place])    Pre-diabetes    Prediabetes 11/10/2018   Shortness of breath dyspnea     Past Surgical History:  Procedure Laterality Date   ABDOMINAL HYSTERECTOMY     PARTIAL   ANTERIOR LAT LUMBAR FUSION N/A 08/14/2018   Procedure: extreme lateral interbody fusion - Lumbar two-Lumbar three  cortical screws and removal pedicle screws Lumbar -Sacral one;  Surgeon: Kary Kos, MD;  Location: [facility];  Service: Neurosurgery;  Laterality: N/A;   APPENDECTOMY     BACK SURGERY     times 4   CARPAL TUNNEL RELEASE     BIL    CESAREAN SECTION     PARTIAL HYST   CLOSED REDUCTION ULNAR SHAFT     COLONOSCOPY     FOOT SURGERY     LT FOOT CYST REMOVED    HARDWARE REMOVAL N/A 08/14/2018   Procedure: Removal pedicle screws Lumbar -Sacral one with cortical screw fixation lumbar two-three;  Surgeon: Kary Kos, MD;  Location: [facility];  Service: Neurosurgery;  Laterality: N/A;   NASAL SINUS SURGERY     1994    radial head Right 01/14/2016   at [place] Right 01/26/2016   Procedure: RIGHT RADIAL HEAD REPLACEMENT ARTHROPLASTY WITH LIGAMENT REPAIRS;  Surgeon: Charlotte Crumb, MD;  Location: [facility];  Service: Orthopedics;  Laterality: Right;  Right elbow raial head replacement with possible ligament repairs as needed    UPPER GASTROINTESTINAL ENDOSCOPY      Allergies  Allergen Reactions   Versed [Midazolam] Other (See Comments)    Stroke   Iodine Nausea And Vomiting    During a myelogram    Lipitor [Atorvastatin] Other (See Comments)    headaches   Zocor [Simvastatin] Other (See Comments)    Reports leg cramps w/ doses over 20mg  daily.   Propoxyphene Hcl Palpitations    darvon     Objective/Physical Exam Neurovascular status intact.  Incision well coapted with sutures intact. No sign of infectious process noted. No dehiscence. No active bleeding noted.  Moderate edema noted to the surgical extremity.  Radiographic Exam LT foot 04/17/2022:  Orthopedic stable noted crossing the second TMT which appears stable with routine healing.  Plate and screw fixation of the third TMT noted which is in good anatomical alignment.  Radiolucency throughout the calcaneus noted from the harvesting of the bone autograft.  No acute fractures identified.  Assessment: 1. s/p third TMT revisional arthrodesis left with  harvesting of bone autograft left calcaneus. DOS: 04/11/2022   Plan of Care:  1. Patient was evaluated.  Sutures 2.  Dressings changed.  Clean dry and intact x 1 week 3.  Continue strict nonweightbearing in the cam boot with the assistance of the knee scooter 4.  Refill prescription for Percocet 10/325 mg every 6 hours as needed pain #28 5.  Return to clinic 2 weeks follow-up x-ray   Edrick Kins, DPM [facility]  Dr. Edrick Kins, DPM    [address], [place] [address]                Office [phone]  Fax [phone]

## 2022-05-06 ENCOUNTER — Encounter: Payer: Self-pay | Admitting: Podiatry

## 2022-05-06 ENCOUNTER — Ambulatory Visit (INDEPENDENT_AMBULATORY_CARE_PROVIDER_SITE_OTHER): Payer: Medicare Other | Admitting: Podiatry

## 2022-05-06 ENCOUNTER — Ambulatory Visit (INDEPENDENT_AMBULATORY_CARE_PROVIDER_SITE_OTHER): Payer: Medicare Other

## 2022-05-06 DIAGNOSIS — M960 Pseudarthrosis after fusion or arthrodesis: Secondary | ICD-10-CM

## 2022-05-06 DIAGNOSIS — Z9889 Other specified postprocedural states: Secondary | ICD-10-CM

## 2022-05-06 NOTE — Progress Notes (Signed)
Chief Complaint  Patient presents with   Routine Post Op    POV # 3 DOS 04/11/22 --- REVISIONAL ARTHRODESIS 3RD TARSOMETATARSAL JOINT LEFT, REMOVAL OF ORTHOPEDIC HARDWARE 3RD TARSOMETATARSAL LEFT, HARVESTING OF BONE AUTOGRAFT LEFT CALCANEUS   "I'm ready to get out of that boot and these crutches"    Subjective:  Patient presents today status post revisional third TMT arthrodesis with harvesting of bone autograft left calcaneus.  DOS: 04/11/2022.  Patient is doing very well.  Minimally WBAT in the cam boot with the assistance of the knee scooter.  She states that she has been transitioning out of the cam boot already over the past 2 weeks.  She has no pain to the foot.  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   Neuromuscular disorder    Pre-diabetes    Prediabetes 11/10/2018   Shortness of breath dyspnea     Past Surgical History:  Procedure Laterality Date   ABDOMINAL HYSTERECTOMY     PARTIAL   ANTERIOR LAT LUMBAR FUSION N/A 08/14/2018   Procedure: extreme lateral interbody fusion - Lumbar two-Lumbar three  cortical screws and removal pedicle screws Lumbar -Sacral one;  Surgeon: Donalee Citrin, MD;  Location: [facility];  Service: Neurosurgery;  Laterality: N/A;   APPENDECTOMY     BACK SURGERY     times 4   CARPAL TUNNEL RELEASE     BIL    CESAREAN SECTION     PARTIAL HYST   CLOSED REDUCTION ULNAR SHAFT     COLONOSCOPY     FOOT SURGERY     LT FOOT CYST REMOVED    HARDWARE REMOVAL N/A 08/14/2018   Procedure: Removal pedicle screws Lumbar -Sacral one with cortical screw fixation lumbar two-three;  Surgeon: Donalee Citrin, MD;  Location: [facility];  Service: Neurosurgery;  Laterality: N/A;   NASAL SINUS SURGERY     1994    radial head Right 01/14/2016    at [facility]   RADIAL HEAD ARTHROPLASTY Right 01/26/2016   Procedure: RIGHT RADIAL HEAD REPLACEMENT ARTHROPLASTY WITH LIGAMENT REPAIRS;  Surgeon: Dairl Ponder, MD;  Location: [facility];  Service: Orthopedics;  Laterality: Right;  Right elbow raial head replacement with possible ligament repairs as needed    UPPER GASTROINTESTINAL ENDOSCOPY      Allergies  Allergen Reactions   Versed [Midazolam] Other (See Comments)    Stroke   Iodine Nausea And Vomiting    During a myelogram    Lipitor [Atorvastatin] Other (See Comments)    headaches   Zocor [Simvastatin] Other (See Comments)    Reports leg cramps w/ doses over 20mg  daily.   Propoxyphene Hcl Palpitations    darvon     Objective/Physical Exam Neurovascular status intact.  Incision nicely healed.  Minimal edema noted.  There is no tenderness with palpation throughout the foot. Radiographic Exam LT foot 05/06/2022:  Orthopedic stable noted crossing the second TMT which appears stable with routine healing.  Plate and screw fixation of the third TMT noted which is in good anatomical alignment.  Radiolucency throughout the calcaneus noted from the harvesting of the bone autograft.  No acute fractures identified.  Assessment: 1. s/p third TMT revisional arthrodesis left  with harvesting of bone autograft left calcaneus. DOS: 04/11/2022   Plan of Care:  1. Patient was evaluated.   2.  Overall the foot appears very stable.  She may continue to transition out of the cam boot into good supportive tennis shoes and sneakers 3.  Continue compression Ace wrap daily 4.  Return to clinic 4 weeks follow-up x-ray   Felecia Shelling, DPM [facility]  Dr. Felecia Shelling, DPM    [address] [place], [place] [address]                Office [phone]  Fax [phone]

## 2022-05-14 ENCOUNTER — Other Ambulatory Visit: Payer: Self-pay | Admitting: Nurse Practitioner

## 2022-05-14 ENCOUNTER — Ambulatory Visit (INDEPENDENT_AMBULATORY_CARE_PROVIDER_SITE_OTHER): Payer: Medicare Other

## 2022-05-14 VITALS — Ht 64.0 in | Wt 220.0 lb

## 2022-05-14 DIAGNOSIS — Z0000 Encounter for general adult medical examination without abnormal findings: Secondary | ICD-10-CM | POA: Diagnosis not present

## 2022-05-14 DIAGNOSIS — N3090 Cystitis, unspecified without hematuria: Secondary | ICD-10-CM

## 2022-05-14 NOTE — Progress Notes (Addendum)
I connected with  Lauren Briggs on 05/14/22 by a audio enabled telemedicine application and verified that I am speaking with the correct person using two identifiers.  Patient Location: Home  Provider Location: Office/Clinic  I discussed the limitations of evaluation and management by telemedicine. The patient expressed understanding and agreed to proceed.  Subjective:   Lauren Briggs is a 67 y.o. female who presents for an Initial Medicare Annual Wellness Visit.  Patient Medicare AWV questionnaire was completed by the patient on 05/11/2022; I have confirmed that all information answered by patient is correct and no changes since this date.   .  Review of Systems     Cardiac Risk Factors include: advanced age (>22men, >19 women);hypertension;obesity (BMI >30kg/m2)     Objective:    Today's Vitals   05/14/22 1013  Weight: 220 lb (99.8 kg)  Height: 5\' 4"  (1.626 m)  PainSc: 3    Body mass index is 37.76 kg/m.     05/14/2022   10:26 AM 12/23/2019    8:00 AM 12/20/2019    9:29 AM 08/14/2018    6:22 AM 08/10/2018    1:13 PM 01/26/2016   11:58 AM 07/04/2014    9:22 AM  Advanced Directives  Does Patient Have a Medical Advance Directive? Yes Yes Yes Yes Yes Yes Yes  Type of Estate agent of [place];Living will Healthcare Power of [place];Living will Healthcare Power of [place];Living will Healthcare Power of [employer] Power of [employer] Power of [employer] Power of [place];Living will  Does patient want to make changes to medical advance directive?  No - Patient declined No - Patient declined No - Patient declined No - Patient declined No - Patient declined No - Patient declined  Copy of Healthcare Power of Attorney in Chart? No - copy requested Yes - validated most recent copy scanned in chart (See row information) Yes - validated most recent copy scanned in chart (See row information) No - copy requested No - copy requested No - copy  requested No - copy requested  Would patient like information on creating a medical advance directive?    No - Patient declined No - Patient declined      Current Medications (verified) Outpatient Encounter Medications as of 05/14/2022  Medication Sig   aspirin EC 81 MG tablet Take 1 tablet (81 mg total) by mouth daily. Swallow whole.   betamethasone valerate ointment (VALISONE) 0.1 % SMARTSIG:sparingly Topical Twice Daily PRN   cetirizine (ZYRTEC) 10 MG tablet Take 10 mg by mouth daily.   Coenzyme Q10 (COQ-10) 100 MG CAPS Take 100 mg by mouth in the morning and at bedtime. otc   escitalopram (LEXAPRO) 10 MG tablet Take 1 tablet (10 mg total) by mouth at bedtime.   estradiol (ESTRACE) 1 MG tablet Take 1 mg by mouth as directed. 1 tab every other day   ezetimibe (ZETIA) 10 MG tablet TAKE 1 TABLET(10 MG) BY MOUTH DAILY   fenofibrate micronized (LOFIBRA) 134 MG capsule TAKE 1 CAPSULE(134 MG) BY MOUTH DAILY BEFORE BREAKFAST   fluconazole (DIFLUCAN) 150 MG tablet Take one tablet by mouth at the first sign of symptoms of yeast. If no resolution, repeat dose in 72 hours.   lisinopril-hydrochlorothiazide (ZESTORETIC) 20-12.5 MG tablet Take 1 tablet by mouth daily.   oxybutynin (DITROPAN XL) 15 MG 24 hr tablet Take 1 tablet (15 mg total) by mouth at bedtime.   propranolol (INDERAL) 20 MG tablet Take 1 tablet (20 mg total) by mouth  3 (three) times daily as needed.   tirzepatide (ZEPBOUND) 7.5 MG/0.5ML Pen Inject 7.5 mg into the skin once a week.   traZODone (DESYREL) 50 MG tablet Take 1 tablet (50 mg total) by mouth at bedtime.   fluticasone (FLONASE) 50 MCG/ACT nasal spray Place 2 sprays into both nostrils daily. (Patient not taking: Reported on 05/14/2022)   oxyCODONE-acetaminophen (PERCOCET) 10-325 MG tablet Take 1 tablet by mouth every 6 (six) hours as needed for pain. (Patient not taking: Reported on 05/14/2022)   phenazopyridine (PYRIDIUM) 100 MG tablet Take 1 tablet (100 mg total) by mouth 3 (three)  times daily as needed for pain. (Patient not taking: Reported on 05/14/2022)   No facility-administered encounter medications on file as of 05/14/2022.    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   Neuromuscular disorder    Pre-diabetes    Prediabetes 11/10/2018   Shortness of breath dyspnea    Past Surgical History:  Procedure Laterality Date   ABDOMINAL HYSTERECTOMY     PARTIAL   ANTERIOR LAT LUMBAR FUSION N/A 08/14/2018   Procedure: extreme lateral interbody fusion - Lumbar two-Lumbar three  cortical screws and removal pedicle screws Lumbar -Sacral one;  Surgeon: Donalee Citrin, MD;  Location: [facility] OR;  Service: Neurosurgery;  Laterality: N/A;   APPENDECTOMY     BACK SURGERY     times 4   CARPAL TUNNEL RELEASE     BIL    CESAREAN SECTION     PARTIAL HYST   CLOSED REDUCTION ULNAR SHAFT     COLONOSCOPY     FOOT SURGERY     LT FOOT CYST REMOVED    FOOT SURGERY Left 04/11/2022   HARDWARE REMOVAL N/A 08/14/2018   Procedure: Removal pedicle screws Lumbar -Sacral one with cortical screw fixation lumbar two-three;  Surgeon: Donalee Citrin, MD;  Location: [facility] OR;  Service: Neurosurgery;  Laterality: N/A;   NASAL SINUS SURGERY     1994    radial head Right 01/14/2016   at [facility]   RADIAL HEAD ARTHROPLASTY Right 01/26/2016   Procedure: RIGHT RADIAL HEAD REPLACEMENT ARTHROPLASTY WITH LIGAMENT REPAIRS;  Surgeon: Dairl Ponder, MD;  Location: [facility];  Service: Orthopedics;  Laterality: Right;  Right elbow raial head replacement with possible ligament repairs as needed    UPPER GASTROINTESTINAL ENDOSCOPY      Family History  Problem Relation Age of Onset   Dementia Mother    Heart attack Mother    Hypertension Mother    Heart disease Mother    Hyperlipidemia Mother    Heart attack Father 76   Heart disease Father    Sudden death Father    Hyperlipidemia Father    Heart attack Maternal Grandmother 72   Stroke Maternal Grandmother    Colon cancer Neg Hx    Colon polyps Neg Hx    Esophageal cancer Neg Hx    Stomach cancer Neg Hx    Rectal cancer Neg Hx    Social History   Socioeconomic History   Marital status: Married    Spouse name: keith   Number of children: Not  on file   Years of education: Not on file   Highest education level: Not on file  Occupational History   Occupation: retired IT consultant  Tobacco Use   Smoking status: Former    Years: 3    Types: Cigarettes    Quit date: 1981    Years since quitting: 43.3   Smokeless tobacco: Former    Quit date: 01/22/1979  Vaping Use   Vaping Use: Never used  Substance and Sexual Activity   Alcohol use: Yes    Comment: occasional bourbon and ginger ale    Drug use: No   Sexual activity: Not on file  Other Topics Concern   Not on file  Social History Narrative   Not on file   Social Determinants of Health   Financial Resource Strain: Low Risk  (05/11/2022)   Overall Financial Resource Strain (CARDIA)    Difficulty of Paying Living Expenses: Not hard at all  Food Insecurity: No Food Insecurity (05/11/2022)   Hunger Vital Sign    Worried About Running Out of Food in the Last Year: Never true    Ran Out of Food in the Last Year: Never true  Transportation Needs: No Transportation Needs (05/11/2022)   PRAPARE - Administrator, Civil Service (Medical): No    Lack of Transportation (Non-Medical): No  Physical Activity: Inactive (05/11/2022)   Exercise Vital Sign    Days of Exercise per Week: 0 days    Minutes of Exercise per Session: 0 min  Stress: No Stress Concern Present (05/11/2022)   [employer] of  Occupational Health - Occupational Stress Questionnaire    Feeling of Stress : Not at all  Social Connections: Unknown (05/11/2022)   Social Connection and Isolation Panel [NHANES]    Frequency of Communication with Friends and Family: Not on file    Frequency of Social Gatherings with Friends and Family: Once a week    Attends Religious Services: Not on Marketing executive or Organizations: No    Attends Banker Meetings: Never    Marital Status: Married    Tobacco Counseling Counseling given: Not Answered   Clinical Intake:  Pre-visit preparation completed: Yes  Pain : 0-10 Pain Score: 3  Pain Type: Acute pain Pain Location:  (bladder) Pain Descriptors / Indicators: Burning Pain Onset: Yesterday Pain Frequency: Constant     Nutritional Status: BMI > 30  Obese Nutritional Risks: None Diabetes: No  How often do you need to have someone help you when you read instructions, pamphlets, or other written materials from your doctor or pharmacy?: 1 - Never  Diabetic? no  Interpreter Needed?: No  Information entered by :: NAllen LPN   Activities of Daily Living    05/11/2022    9:57 AM 12/04/2021    1:56 PM  In your present state of health, do you have any difficulty performing the following activities:  Hearing? 0 0  Vision? 0 0  Difficulty concentrating or making decisions? 0 0  Walking or climbing stairs? 0 1  Comment  foot surgery  Dressing or bathing? 0 0  Doing errands, shopping? 0 0  Preparing Food and eating ? N   Using the Toilet? N   In the past six months, have you accidently leaked urine? N   Do you have problems with loss of bowel control? N   Managing your Medications? N   Managing your Finances? N   Housekeeping or managing your Housekeeping? N  Patient Care Team: Early, Sung Amabile, NP as PCP - General (Nurse Practitioner) Donalee Citrin, MD as Consulting Physician (Neurosurgery)  Indicate any recent Medical Services you may  have received from other than [facility] providers in the past year (date may be approximate).     Assessment:   This is a routine wellness examination for Deanne.  Hearing/Vision screen Vision Screening - Comments:: Regular eye exams, Dr. Charlotte Sanes  Dietary issues and exercise activities discussed: Current Exercise Habits: The patient does not participate in regular exercise at present   Goals Addressed             This Visit's Progress    Patient Stated       05/14/2022, wants to lose weight and get foot back to normal       Depression Screen    05/14/2022   10:28 AM 12/04/2021    1:53 PM 08/03/2021    9:15 AM 05/04/2021    9:07 AM 03/25/2021    9:45 PM 03/02/2019    8:53 AM 11/09/2018    9:41 AM  PHQ 2/9 Scores  PHQ - 2 Score 0 0 0 0 4 1 0  PHQ- 9 Score 0 1 0  16 8   Exception Documentation    Medical reason       Fall Risk    05/11/2022    9:57 AM 12/04/2021    1:56 PM 08/03/2021    9:15 AM 05/04/2021    8:58 AM 03/25/2021    9:46 PM  Fall Risk   Falls in the past year? 0 0 0 0 0  Number falls in past yr:   0 0 0  Injury with Fall? 0  0 0 0  Risk for fall due to : Medication side effect;Impaired mobility;Impaired balance/gait  No Fall Risks No Fall Risks No Fall Risks  Follow up Falls prevention discussed;Education provided;Falls evaluation completed  Falls evaluation completed Education provided;Falls evaluation completed Falls evaluation completed    FALL RISK PREVENTION PERTAINING TO THE HOME:  Any stairs in or around the home? Yes  If so, are there any without handrails? No  Home free of loose throw rugs in walkways, pet beds, electrical cords, etc? Yes  Adequate lighting in your home to reduce risk of falls? Yes   ASSISTIVE DEVICES UTILIZED TO PREVENT FALLS:  Life alert? No  Use of a cane, walker or w/c? Yes  Grab bars in the bathroom? Yes  Shower chair or bench in shower? Yes  Elevated toilet seat or a handicapped toilet? no  TIMED UP AND GO:  Was the test  performed? No .      Cognitive Function:    06/07/2021    1:21 PM 01/03/2021    8:51 AM  MMSE - Mini Mental State Exam  Orientation to time 5 5  Orientation to Place 5 5  Registration 3 3  Attention/ Calculation 4 5  Recall 2 1  Language- name 2 objects 2 2  Language- repeat 1 1  Language- follow 3 step command 3 3  Language- read & follow direction 1 1  Write a sentence 1 1  Copy design 1 1  Total score 28 28        05/14/2022   10:30 AM  6CIT Screen  What Year? 0 points  What month? 0 points  What time? 0 points  Count back from 20 0 points  Months in reverse 0 points  Repeat phrase 4 points  Total Score 4  points    Immunizations Immunization History  Administered Date(s) Administered   PFIZER(Purple Top)SARS-COV-2 Vaccination 03/25/2019, 04/24/2019, 11/17/2019, 11/20/2020   Tdap 09/01/2014    TDAP status: Up to date  Flu Vaccine status: Up to date  Pneumococcal vaccine status: Declined,  Education has been provided regarding the importance of this vaccine but patient still declined. Advised may receive this vaccine at local pharmacy or Health Dept. Aware to provide a copy of the vaccination record if obtained from local pharmacy or Health Dept. Verbalized acceptance and understanding.   Covid-19 vaccine status: Completed vaccines  Qualifies for Shingles Vaccine?  N/a   Zostavax completed  n/a   Shingrix Completed?: n/a  Screening Tests Health Maintenance  Topic Date Due   Medicare Annual Wellness (AWV)  Never done   DEXA SCAN  Never done   MAMMOGRAM  11/01/2020   COVID-19 Vaccine (5 - 2023-24 season) 09/21/2021   Pneumonia Vaccine 30+ Years old (1 of 1 - PCV) 08/04/2022 (Originally 07/23/2020)   DTaP/Tdap/Td (2 - Td or Tdap) 08/31/2024   COLONOSCOPY (Pts 45-60yrs Insurance coverage will need to be confirmed)  02/10/2028   HPV VACCINES  Aged Out   INFLUENZA VACCINE  Discontinued   Hepatitis C Screening  Discontinued   Zoster Vaccines- Shingrix   Discontinued    Health Maintenance  Health Maintenance Due  Topic Date Due   Medicare Annual Wellness (AWV)  Never done   DEXA SCAN  Never done   MAMMOGRAM  11/01/2020   COVID-19 Vaccine (5 - 2023-24 season) 09/21/2021    Colorectal cancer screening: Type of screening: Colonoscopy. Completed 02/09/2018. Repeat every 10 years  Mammogram status: Completed 10/2021. Repeat every year  Bone Density status: decline  Lung Cancer Screening: (Low Dose CT Chest recommended if Age 66-80 years, 30 pack-year currently smoking OR have quit w/in 15years.) does not qualify.   Lung Cancer Screening Referral: no  Additional Screening:  Hepatitis C Screening: does not qualify;   Vision Screening: Recommended annual ophthalmology exams for early detection of glaucoma and other disorders of the eye. Is the patient up to date with their annual eye exam?  Yes  Who is the provider or what is the name of the office in which the patient attends annual eye exams? Dr. Charlotte Sanes If pt is not established with a provider, would they like to be referred to a provider to establish care? No .   Dental Screening: Recommended annual dental exams for proper oral hygiene  Community Resource Referral / Chronic Care Management: CRR required this visit?  No   CCM required this visit?  No      Plan:     I have personally reviewed and noted the following in the patient's chart:   Medical and social history Use of alcohol, tobacco or illicit drugs  Current medications and supplements including opioid prescriptions. Patient is not currently taking opioid prescriptions. Functional ability and status Nutritional status Physical activity Advanced directives List of other physicians Hospitalizations, surgeries, and ER visits in previous 12 months Vitals Screenings to include cognitive, depression, and falls Referrals and appointments  In addition, I have reviewed and discussed with patient certain preventive  protocols, quality metrics, and best practice recommendations. A written personalized care plan for preventive services as well as general preventive health recommendations were provided to patient.     Barb Merino, LPN   1/61/0960   Nurse Notes: none  Due to this being a virtual visit, the after visit summary with patients personalized  plan was offered to patient via mail or my-chart.  Patient would like to access on my-chart

## 2022-05-14 NOTE — Patient Instructions (Signed)
Ms. Batta , Thank you for taking time to come for your Medicare Wellness Visit. I appreciate your ongoing commitment to your health goals. Please review the following plan we discussed and let me know if I can assist you in the future.   These are the goals we discussed:  Goals      Blood Pressure < 130/80     Patient Stated     05/14/2022, wants to lose weight and get foot back to normal        This is a list of the screening recommended for you and due dates:  Health Maintenance  Topic Date Due   DEXA scan (bone density measurement)  Never done   Mammogram  11/01/2020   COVID-19 Vaccine (5 - 2023-24 season) 09/21/2021   Pneumonia Vaccine (1 of 1 - PCV) 08/04/2022*   Medicare Annual Wellness Visit  05/14/2023   DTaP/Tdap/Td vaccine (2 - Td or Tdap) 08/31/2024   Colon Cancer Screening  02/10/2028   HPV Vaccine  Aged Out   Flu Shot  Discontinued   Hepatitis C Screening: USPSTF Recommendation to screen - Ages 18-79 yo.  Discontinued   Zoster (Shingles) Vaccine  Discontinued  *Topic was postponed. The date shown is not the original due date.    Advanced directives: Please bring a copy of your POA (Power of Attorney) and/or Living Will to your next appointment.   Conditions/risks identified: none  Next appointment: Follow up in one year for your annual wellness visit    Preventive Care 65 Years and Older, Female Preventive care refers to lifestyle choices and visits with your health care provider that can promote health and wellness. What does preventive care include? A yearly physical exam. This is also called an annual well check. Dental exams once or twice a year. Routine eye exams. Ask your health care provider how often you should have your eyes checked. Personal lifestyle choices, including: Daily care of your teeth and gums. Regular physical activity. Eating a healthy diet. Avoiding tobacco and drug use. Limiting alcohol use. Practicing safe sex. Taking low-dose  aspirin every day. Taking vitamin and mineral supplements as recommended by your health care provider. What happens during an annual well check? The services and screenings done by your health care provider during your annual well check will depend on your age, overall health, lifestyle risk factors, and family history of disease. Counseling  Your health care provider may ask you questions about your: Alcohol use. Tobacco use. Drug use. Emotional well-being. Home and relationship well-being. Sexual activity. Eating habits. History of falls. Memory and ability to understand (cognition). Work and work Astronomer. Reproductive health. Screening  You may have the following tests or measurements: Height, weight, and BMI. Blood pressure. Lipid and cholesterol levels. These may be checked every 5 years, or more frequently if you are over 81 years old. Skin check. Lung cancer screening. You may have this screening every year starting at age 67 if you have a 30-pack-year history of smoking and currently smoke or have quit within the past 15 years. Fecal occult blood test (FOBT) of the stool. You may have this test every year starting at age 67. Flexible sigmoidoscopy or colonoscopy. You may have a sigmoidoscopy every 5 years or a colonoscopy every 10 years starting at age 67. Hepatitis C blood test. Hepatitis B blood test. Sexually transmitted disease (STD) testing. Diabetes screening. This is done by checking your blood sugar (glucose) after you have not eaten for a while (fasting). You  may have this done every 1-3 years. Bone density scan. This is done to screen for osteoporosis. You may have this done starting at age 67. Mammogram. This may be done every 1-2 years. Talk to your health care provider about how often you should have regular mammograms. Talk with your health care provider about your test results, treatment options, and if necessary, the need for more tests. Vaccines  Your  health care provider may recommend certain vaccines, such as: Influenza vaccine. This is recommended every year. Tetanus, diphtheria, and acellular pertussis (Tdap, Td) vaccine. You may need a Td booster every 10 years. Zoster vaccine. You may need this after age 67. Pneumococcal 13-valent conjugate (PCV13) vaccine. One dose is recommended after age 67. Pneumococcal polysaccharide (PPSV23) vaccine. One dose is recommended after age 67. Talk to your health care provider about which screenings and vaccines you need and how often you need them. This information is not intended to replace advice given to you by your health care provider. Make sure you discuss any questions you have with your health care provider. Document Released: 02/03/2015 Document Revised: 09/27/2015 Document Reviewed: 11/08/2014 Elsevier Interactive Patient Education  2017 [place] Prevention in the Home Falls can cause injuries. They can happen to people of all ages. There are many things you can do to make your home safe and to help prevent falls. What can I do on the outside of my home? Regularly fix the edges of walkways and driveways and fix any cracks. Remove anything that might make you trip as you walk through a door, such as a raised step or threshold. Trim any bushes or trees on the path to your home. Use bright outdoor lighting. Clear any walking paths of anything that might make someone trip, such as rocks or tools. Regularly check to see if handrails are loose or broken. Make sure that both sides of any steps have handrails. Any raised decks and porches should have guardrails on the edges. Have any leaves, snow, or ice cleared regularly. Use sand or salt on walking paths during winter. Clean up any spills in your garage right away. This includes oil or grease spills. What can I do in the bathroom? Use night lights. Install grab bars by the toilet and in the tub and shower. Do not use towel bars as  grab bars. Use non-skid mats or decals in the tub or shower. If you need to sit down in the shower, use a plastic, non-slip stool. Keep the floor dry. Clean up any water that spills on the floor as soon as it happens. Remove soap buildup in the tub or shower regularly. Attach bath mats securely with double-sided non-slip rug tape. Do not have throw rugs and other things on the floor that can make you trip. What can I do in the bedroom? Use night lights. Make sure that you have a light by your bed that is easy to reach. Do not use any sheets or blankets that are too big for your bed. They should not hang down onto the floor. Have a firm chair that has side arms. You can use this for support while you get dressed. Do not have throw rugs and other things on the floor that can make you trip. What can I do in the kitchen? Clean up any spills right away. Avoid walking on wet floors. Keep items that you use a lot in easy-to-reach places. If you need to reach something above you, use a strong  step stool that has a grab bar. Keep electrical cords out of the way. Do not use floor polish or wax that makes floors slippery. If you must use wax, use non-skid floor wax. Do not have throw rugs and other things on the floor that can make you trip. What can I do with my stairs? Do not leave any items on the stairs. Make sure that there are handrails on both sides of the stairs and use them. Fix handrails that are broken or loose. Make sure that handrails are as long as the stairways. Check any carpeting to make sure that it is firmly attached to the stairs. Fix any carpet that is loose or worn. Avoid having throw rugs at the top or bottom of the stairs. If you do have throw rugs, attach them to the floor with carpet tape. Make sure that you have a light switch at the top of the stairs and the bottom of the stairs. If you do not have them, ask someone to add them for you. What else can I do to help prevent  falls? Wear shoes that: Do not have high heels. Have rubber bottoms. Are comfortable and fit you well. Are closed at the toe. Do not wear sandals. If you use a stepladder: Make sure that it is fully opened. Do not climb a closed stepladder. Make sure that both sides of the stepladder are locked into place. Ask someone to hold it for you, if possible. Clearly mark and make sure that you can see: Any grab bars or handrails. First and last steps. Where the edge of each step is. Use tools that help you move around (mobility aids) if they are needed. These include: Canes. Walkers. Scooters. Crutches. Turn on the lights when you go into a dark area. Replace any light bulbs as soon as they burn out. Set up your furniture so you have a clear path. Avoid moving your furniture around. If any of your floors are uneven, fix them. If there are any pets around you, be aware of where they are. Review your medicines with your doctor. Some medicines can make you feel dizzy. This can increase your chance of falling. Ask your doctor what other things that you can do to help prevent falls. This information is not intended to replace advice given to you by your health care provider. Make sure you discuss any questions you have with your health care provider. Document Released: 11/03/2008 Document Revised: 06/15/2015 Document Reviewed: 02/11/2014 Elsevier Interactive Patient Education  2017 [employer].

## 2022-05-15 ENCOUNTER — Other Ambulatory Visit: Payer: Self-pay | Admitting: Nurse Practitioner

## 2022-05-15 DIAGNOSIS — N3090 Cystitis, unspecified without hematuria: Secondary | ICD-10-CM

## 2022-05-15 MED ORDER — FLUCONAZOLE 150 MG PO TABS: ORAL_TABLET | ORAL | 2 refills | Status: DC

## 2022-05-15 NOTE — Telephone Encounter (Signed)
Pt. Sent mychart message requesting a refill on this for possible yeast infection, last apt 04/05/22.

## 2022-05-29 ENCOUNTER — Encounter: Payer: Medicare Other | Admitting: Podiatry

## 2022-05-30 ENCOUNTER — Encounter: Payer: Self-pay | Admitting: Nurse Practitioner

## 2022-05-30 ENCOUNTER — Ambulatory Visit (INDEPENDENT_AMBULATORY_CARE_PROVIDER_SITE_OTHER): Payer: Medicare Other | Admitting: Nurse Practitioner

## 2022-05-30 VITALS — BP 124/72 | HR 71 | Wt 222.0 lb

## 2022-05-30 DIAGNOSIS — I10 Essential (primary) hypertension: Secondary | ICD-10-CM

## 2022-05-30 DIAGNOSIS — N3001 Acute cystitis with hematuria: Secondary | ICD-10-CM | POA: Diagnosis not present

## 2022-05-30 DIAGNOSIS — E782 Mixed hyperlipidemia: Secondary | ICD-10-CM | POA: Diagnosis not present

## 2022-05-30 DIAGNOSIS — K760 Fatty (change of) liver, not elsewhere classified: Secondary | ICD-10-CM

## 2022-05-30 DIAGNOSIS — Z6841 Body Mass Index (BMI) 40.0 and over, adult: Secondary | ICD-10-CM

## 2022-05-30 DIAGNOSIS — R7303 Prediabetes: Secondary | ICD-10-CM

## 2022-05-30 DIAGNOSIS — M545 Low back pain, unspecified: Secondary | ICD-10-CM | POA: Insufficient documentation

## 2022-05-30 DIAGNOSIS — N952 Postmenopausal atrophic vaginitis: Secondary | ICD-10-CM

## 2022-05-30 DIAGNOSIS — N289 Disorder of kidney and ureter, unspecified: Secondary | ICD-10-CM

## 2022-05-30 DIAGNOSIS — G629 Polyneuropathy, unspecified: Secondary | ICD-10-CM

## 2022-05-30 DIAGNOSIS — N390 Urinary tract infection, site not specified: Secondary | ICD-10-CM

## 2022-05-30 MED ORDER — NITROFURANTOIN MONOHYD MACRO 100 MG PO CAPS: 100.0000 mg | ORAL_CAPSULE | Freq: Every day | ORAL | 0 refills | Status: DC

## 2022-05-30 MED ORDER — CIPROFLOXACIN HCL 500 MG PO TABS: 500.0000 mg | ORAL_TABLET | Freq: Two times a day (BID) | ORAL | 0 refills | Status: AC

## 2022-05-30 NOTE — Assessment & Plan Note (Signed)
Chronic.  She is currently working with healthy weight wellness on management.  No alarm symptoms present at this time.  Will obtain labs today for monitoring.

## 2022-05-30 NOTE — Assessment & Plan Note (Signed)
Strongly suspect atrophic vaginitis as the leading cause of recurrent UTI.  She is on estradiol daily.  She has had a hysterectomy.  Recommend use of topical Aquaphor to labia and vaginal introitus to help facilitate increased vaginal moisture and reduce the risk of recurrent urinary tract infections. Plan: I will send a referral today for gynecology to evaluate for the possibility of a prolapsed bladder.

## 2022-05-30 NOTE — Assessment & Plan Note (Signed)
She has been experiencing symptoms consistent with UTI including urgency, dysuria, and positive urine dipstick results with large leukocytes, nitrates, and blood present.  This is an ongoing issue with recurrent UTIs over the past several years.  We discussed today the etiology possibly related to vaginal atrophy.  She is not currently sexually active.  She does have concerns of possible bladder prolapse without any cystocele noted. Plan: Prescribe ciprofloxacin for 5 days for acute treatment. Nitrofurantoin has been prescribed for once daily to start after completion of ciprofloxacin for prophylactic prevention. Recommend use of Aquaphor to the labia and vaginal area to help with atrophy symptoms. Patient educated on importance of not holding urine and adequate water intake. Will send referral to gynecology for evaluation due to concerns of possible bladder prolapse.

## 2022-05-30 NOTE — Assessment & Plan Note (Signed)
Chronic history of prediabetes in the setting of hypertension, hyperlipidemia, and BMI 38.11%.  She is currently on 2 pounds for management of weight and monitoring her diet to help with control.  No alarm symptoms are present at this time.  We will obtain labs today for monitoring. Plan: Continue with current medication regimen. Work to incorporate increased Zickel activity into your daily routine

## 2022-05-30 NOTE — Assessment & Plan Note (Signed)
Chronic hypertension currently managed with lisinopril-hydrochlorothiazide.  Blood pressure is stable today.  No alarm symptoms are present.  Labs pending.  Refills have been provided.

## 2022-05-30 NOTE — Patient Instructions (Addendum)
I have sent in nitrofurantoin for you to take daily as a prophylactic for the UTI's. We will try this for 3 months and see how you do with it.   Try xyzal to see if this helps with your sinus issues. This is over the counter and can be very effective, but it needs to be taken at bedtime because it will make you sleepy.   Try aquaphor on the vaginal opening.

## 2022-05-30 NOTE — Assessment & Plan Note (Signed)
History of fatty liver.  She is currently working on diet and exercise to help manage this.  We will obtain labs today for evaluation.

## 2022-05-30 NOTE — Progress Notes (Signed)
Tollie Eth, DNP, AGNP-c [facility] [address] [place], [place] [address] [phone]  Subjective:   Lauren Briggs is a 67 y.o. female presents to day for evaluation of:  Reveca presents today with chief complaints of recurrent UTI that began within the past 2 to 3 years.  Over the past several days she has been experiencing persistent issue of dysuria and suprapubic spasms.  She endorses frustration over the current symptoms.  She has been taking Azo to manage until she can be seen.  Xiclali is concerned about the recurrent nature of her UTIs and questions whether her medications or other conditions may be contributing to these factors.  Additionally, Goldie mentions a sinus problem that has been troubling her for about the past 3 to 4 weeks.  She describes symptoms including persistent cough and the need to frequently clear her sinuses, which she feels are exacerbated by the current pollen levels.  Despite trying various over-the-counter remedies, she finds relief.  She would also like fasting blood work today.   PMH, Medications, and Allergies reviewed and updated in chart as appropriate.   ROS negative except for what is listed in HPI. Objective:  BP 124/72   Pulse 71   Wt 222 lb (100.7 kg)   BMI 38.11 kg/m  Physical Exam Vitals reviewed.  Constitutional:      Appearance: Normal appearance.  HENT:     Head: Normocephalic.     Nose: Congestion present.     Mouth/Throat:     Mouth: Mucous membranes are moist.     Pharynx: Oropharynx is clear.  Eyes:     Pupils: Pupils are equal, round, and reactive to light.  Cardiovascular:     Rate and Rhythm: Normal rate and regular rhythm.     Pulses: Normal pulses.     Heart sounds: Normal heart sounds.  Pulmonary:     Effort: Pulmonary effort is normal.     Breath sounds: Normal breath sounds.  Abdominal:     General: Bowel sounds are normal.     Palpations: Abdomen is soft.     Tenderness: There is no  abdominal tenderness. There is no right CVA tenderness or left CVA tenderness.  Musculoskeletal:     Cervical back: Normal range of motion.  Skin:    General: Skin is warm and dry.     Capillary Refill: Capillary refill takes less than 2 seconds.  Neurological:     General: No focal deficit present.     Mental Status: She is alert.  Psychiatric:        Mood and Affect: Mood normal.           Assessment & Plan:   Problem List Items Addressed This Visit     Essential hypertension    Chronic hypertension currently managed with lisinopril-hydrochlorothiazide.  Blood pressure is stable today.  No alarm symptoms are present.  Labs pending.  Refills have been provided.      Relevant Medications   simvastatin (ZOCOR) 20 MG tablet   ciprofloxacin (CIPRO) 500 MG tablet   nitrofurantoin, macrocrystal-monohydrate, (MACROBID) 100 MG capsule   Other Relevant Orders   Hemoglobin A1c (Completed)   CBC with Differential/Platelet (Completed)   Comprehensive metabolic panel (Completed)   Lipid panel (Completed)   VITAMIN D 25 Hydroxy (Vit-D Deficiency, Fractures) (Completed)   Prediabetes    Chronic history of prediabetes in the setting of hypertension, hyperlipidemia, and BMI 38.11%.  She is currently on 2 pounds for management of  weight and monitoring her diet to help with control.  No alarm symptoms are present at this time.  We will obtain labs today for monitoring. Plan: Continue with current medication regimen. Work to incorporate increased Zickel activity into your daily routine      Relevant Medications   ciprofloxacin (CIPRO) 500 MG tablet   nitrofurantoin, macrocrystal-monohydrate, (MACROBID) 100 MG capsule   Other Relevant Orders   Hemoglobin A1c (Completed)   CBC with Differential/Platelet (Completed)   Comprehensive metabolic panel (Completed)   Lipid panel (Completed)   VITAMIN D 25 Hydroxy (Vit-D Deficiency, Fractures) (Completed)   Body mass index (BMI) 40.0-44.9,  adult (HCC)    Chronic.  She is currently working with healthy weight wellness on management.  No alarm symptoms present at this time.  Will obtain labs today for monitoring.      Relevant Medications   ciprofloxacin (CIPRO) 500 MG tablet   nitrofurantoin, macrocrystal-monohydrate, (MACROBID) 100 MG capsule   Other Relevant Orders   Hemoglobin A1c (Completed)   CBC with Differential/Platelet (Completed)   Comprehensive metabolic panel (Completed)   Lipid panel (Completed)   VITAMIN D 25 Hydroxy (Vit-D Deficiency, Fractures) (Completed)   Acute cystitis with hematuria - Primary    She has been experiencing symptoms consistent with UTI including urgency, dysuria, and positive urine dipstick results with large leukocytes, nitrates, and blood present.  This is an ongoing issue with recurrent UTIs over the past several years.  We discussed today the etiology possibly related to vaginal atrophy.  She is not currently sexually active.  She does have concerns of possible bladder prolapse without any cystocele noted. Plan: Prescribe ciprofloxacin for 5 days for acute treatment. Nitrofurantoin has been prescribed for once daily to start after completion of ciprofloxacin for prophylactic prevention. Recommend use of Aquaphor to the labia and vaginal area to help with atrophy symptoms. Patient educated on importance of not holding urine and adequate water intake. Will send referral to gynecology for evaluation due to concerns of possible bladder prolapse.      Relevant Medications   ciprofloxacin (CIPRO) 500 MG tablet   Hyperlipidemia    Chronic hyperlipidemia in the setting of hypertension and prediabetes.  She is currently utilizing Zetia, fenofibrate, and simvastatin for control.  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  Plan: Continue current medications. Labs pending.   Continue to work on diet and exercise      Relevant Medications   simvastatin (ZOCOR) 20 MG tablet   ciprofloxacin  (CIPRO) 500 MG tablet   nitrofurantoin, macrocrystal-monohydrate, (MACROBID) 100 MG capsule   Other Relevant Orders   Hemoglobin A1c (Completed)   CBC with Differential/Platelet (Completed)   Comprehensive metabolic panel (Completed)   Lipid panel (Completed)   VITAMIN D 25 Hydroxy (Vit-D Deficiency, Fractures) (Completed)   Fatty liver    History of fatty liver.  She is currently working on diet and exercise to help manage this.  We will obtain labs today for evaluation.      Relevant Medications   ciprofloxacin (CIPRO) 500 MG tablet   nitrofurantoin, macrocrystal-monohydrate, (MACROBID) 100 MG capsule   Other Relevant Orders   Hemoglobin A1c (Completed)   CBC with Differential/Platelet (Completed)   Comprehensive metabolic panel (Completed)   Lipid panel (Completed)   VITAMIN D 25 Hydroxy (Vit-D Deficiency, Fractures) (Completed)   Vaginal atrophy    Strongly suspect atrophic vaginitis as the leading cause of recurrent UTI.  She is on estradiol daily.  She has had a hysterectomy.  Recommend  use of topical Aquaphor to labia and vaginal introitus to help facilitate increased vaginal moisture and reduce the risk of recurrent urinary tract infections. Plan: I will send a referral today for gynecology to evaluate for the possibility of a prolapsed bladder.      Neuropathy   Relevant Medications   ciprofloxacin (CIPRO) 500 MG tablet   nitrofurantoin, macrocrystal-monohydrate, (MACROBID) 100 MG capsule   Other Relevant Orders   Hemoglobin A1c (Completed)   CBC with Differential/Platelet (Completed)   Comprehensive metabolic panel (Completed)   Lipid panel (Completed)   VITAMIN D 25 Hydroxy (Vit-D Deficiency, Fractures) (Completed)   Other Visit Diagnoses     Chronic UTI       Relevant Medications   nitrofurantoin, macrocrystal-monohydrate, (MACROBID) 100 MG capsule         Tollie Eth, DNP, AGNP-c 05/30/2022  10:00 PM    History, Medications, Surgery, SDOH, and Family  History reviewed and updated as appropriate.

## 2022-05-30 NOTE — Assessment & Plan Note (Signed)
Chronic hyperlipidemia in the setting of hypertension and prediabetes.  She is currently utilizing Zetia, fenofibrate, and simvastatin for control.  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  Plan: Continue current medications. Labs pending.   Continue to work on diet and exercise

## 2022-05-31 LAB — COMPREHENSIVE METABOLIC PANEL
ALT: 17 IU/L (ref 0–32)
AST: 20 IU/L (ref 0–40)
Albumin/Globulin Ratio: 1.9 (ref 1.2–2.2)
Albumin: 4.5 g/dL (ref 3.9–4.9)
Alkaline Phosphatase: 42 IU/L — ABNORMAL LOW (ref 44–121)
BUN/Creatinine Ratio: 23 (ref 12–28)
BUN: 41 mg/dL — ABNORMAL HIGH (ref 8–27)
Bilirubin Total: 0.5 mg/dL (ref 0.0–1.2)
CO2: 21 mmol/L (ref 20–29)
Calcium: 10.8 mg/dL — ABNORMAL HIGH (ref 8.7–10.3)
Chloride: 102 mmol/L (ref 96–106)
Creatinine, Ser: 1.82 mg/dL — ABNORMAL HIGH (ref 0.57–1.00)
Globulin, Total: 2.4 g/dL (ref 1.5–4.5)
Glucose: 106 mg/dL — ABNORMAL HIGH (ref 70–99)
Potassium: 4.8 mmol/L (ref 3.5–5.2)
Sodium: 138 mmol/L (ref 134–144)
Total Protein: 6.9 g/dL (ref 6.0–8.5)
eGFR: 30 mL/min/{1.73_m2} — ABNORMAL LOW (ref >59)

## 2022-05-31 LAB — CBC WITH DIFFERENTIAL/PLATELET
Basophils Absolute: 0 10*3/uL (ref 0.0–0.2)
Basos: 0 %
EOS (ABSOLUTE): 0.1 10*3/uL (ref 0.0–0.4)
Eos: 2 %
Hematocrit: 35.4 % (ref 34.0–46.6)
Hemoglobin: 11.5 g/dL (ref 11.1–15.9)
Immature Grans (Abs): 0 10*3/uL (ref 0.0–0.1)
Immature Granulocytes: 0 %
Lymphocytes Absolute: 2.1 10*3/uL (ref 0.7–3.1)
Lymphs: 23 %
MCH: 28.8 pg (ref 26.6–33.0)
MCHC: 32.5 g/dL (ref 31.5–35.7)
MCV: 89 fL (ref 79–97)
Monocytes Absolute: 0.6 10*3/uL (ref 0.1–0.9)
Monocytes: 7 %
Neutrophils Absolute: 6.2 10*3/uL (ref 1.4–7.0)
Neutrophils: 68 %
Platelets: 449 10*3/uL (ref 150–450)
RBC: 3.99 x10E6/uL (ref 3.77–5.28)
RDW: 13.2 % (ref 11.7–15.4)
WBC: 9.1 10*3/uL (ref 3.4–10.8)

## 2022-05-31 LAB — LIPID PANEL
Chol/HDL Ratio: 4.6 ratio — ABNORMAL HIGH (ref 0.0–4.4)
Cholesterol, Total: 226 mg/dL — ABNORMAL HIGH (ref 100–199)
HDL: 49 mg/dL (ref >39)
LDL Chol Calc (NIH): 147 mg/dL — ABNORMAL HIGH (ref 0–99)
Triglycerides: 169 mg/dL — ABNORMAL HIGH (ref 0–149)
VLDL Cholesterol Cal: 30 mg/dL (ref 5–40)

## 2022-05-31 LAB — VITAMIN D 25 HYDROXY (VIT D DEFICIENCY, FRACTURES): Vit D, 25-Hydroxy: 62.9 ng/mL (ref 30.0–100.0)

## 2022-05-31 LAB — HEMOGLOBIN A1C
Est. average glucose Bld gHb Est-mCnc: 114 mg/dL
Hgb A1c MFr Bld: 5.6 % (ref 4.8–5.6)

## 2022-06-03 ENCOUNTER — Ambulatory Visit (INDEPENDENT_AMBULATORY_CARE_PROVIDER_SITE_OTHER): Payer: Medicare Other

## 2022-06-03 ENCOUNTER — Ambulatory Visit (INDEPENDENT_AMBULATORY_CARE_PROVIDER_SITE_OTHER): Payer: Medicare Other | Admitting: Podiatry

## 2022-06-03 DIAGNOSIS — Z9889 Other specified postprocedural states: Secondary | ICD-10-CM

## 2022-06-03 DIAGNOSIS — M960 Pseudarthrosis after fusion or arthrodesis: Secondary | ICD-10-CM

## 2022-06-03 NOTE — Progress Notes (Signed)
Chief Complaint  Patient presents with   Routine Post Op    POV #4 DOS 04/11/22 --- REVISIONAL ARTHRODESIS 3RD TARSOMETATARSAL JOINT LEFT, REMOVAL OF ORTHOPEDIC HARDWARE 3RD TARSOMETATARSAL LEFT, HARVESTING OF BONE AUTOGRAFT LEFT CALCANEUS, patient denies any pain, just sore , No N/V/F/C/SOB, X-Rays done today, TX: compression sleeve    Subjective:  Patient presents today status post revisional third TMT arthrodesis with harvesting of bone autograft left calcaneus.  DOS: 04/11/2022.  Patient has been weightbearing with the assistance of a cane in tennis shoes.  She says that she continues to have some tenderness and achiness to the foot but overall it is somewhat stable.  She wears a compression ankle sleeve daily.  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   Neuromuscular disorder (HCC)    Pre-diabetes    Prediabetes 11/10/2018   Shortness of breath dyspnea     Past Surgical History:  Procedure Laterality Date   ABDOMINAL HYSTERECTOMY     PARTIAL   ANTERIOR LAT LUMBAR FUSION N/A 08/14/2018   Procedure: extreme lateral interbody fusion - Lumbar two-Lumbar three  cortical screws and removal pedicle screws Lumbar -Sacral one;  Surgeon: Donalee Citrin, MD;  Location: [facility] OR;  Service: Neurosurgery;  Laterality: N/A;   APPENDECTOMY     BACK SURGERY     times 4   CARPAL TUNNEL RELEASE     BIL    CESAREAN SECTION     PARTIAL HYST   CLOSED REDUCTION ULNAR SHAFT     COLONOSCOPY     FOOT SURGERY     LT FOOT CYST REMOVED    FOOT SURGERY Left 04/11/2022   HARDWARE REMOVAL N/A 08/14/2018   Procedure: Removal pedicle screws Lumbar -Sacral one with cortical screw fixation lumbar two-three;  Surgeon: Donalee Citrin, MD;  Location: [facility] OR;   Service: Neurosurgery;  Laterality: N/A;   NASAL SINUS SURGERY     1994    radial head Right 01/14/2016   at [facility]   RADIAL HEAD ARTHROPLASTY Right 01/26/2016   Procedure: RIGHT RADIAL HEAD REPLACEMENT ARTHROPLASTY WITH LIGAMENT REPAIRS;  Surgeon: Dairl Ponder, MD;  Location: [facility];  Service: Orthopedics;  Laterality: Right;  Right elbow raial head replacement with possible ligament repairs as needed    UPPER GASTROINTESTINAL ENDOSCOPY      Allergies  Allergen Reactions   Versed [Midazolam] Other (See Comments)    Stroke   Iodine Nausea And Vomiting    During a myelogram    Lipitor [Atorvastatin] Other (See Comments)    headaches   Zocor [Simvastatin] Other (See Comments)    Reports leg cramps w/ doses over 20mg  daily.   Propoxyphene Hcl Palpitations    darvon     Objective/Physical Exam Neurovascular status intact.  Incision nicely healed.  Minimal edema noted.  There are some very mild tenderness with palpation throughout the foot Radiographic Exam LT foot 06/03/2022:  Essentially unchanged.  Orthopedic stable noted crossing the second TMT which appears stable with routine healing.  Plate and screw fixation of the third TMT noted which is in good anatomical alignment.  Radiolucency throughout the calcaneus noted from the harvesting of the bone autograft.  No acute fractures identified.  Assessment: 1. s/p third TMT revisional arthrodesis left with harvesting of bone autograft left calcaneus. DOS: 04/11/2022  -Patient evaluated.  X-rays reviewed -Continue WBAT tennis shoes. -Continue compression ankle sleeve daily -Return to clinic 6 weeks follow-up x-ray   Felecia Shelling, DPM [facility]  Dr. Felecia Shelling, DPM    [address] [place], [place] [address]                Office [phone]  Fax [phone]

## 2022-06-05 ENCOUNTER — Encounter: Payer: Medicare Other | Admitting: Podiatry

## 2022-06-05 NOTE — Addendum Note (Signed)
Addended by: Keerthi Hazell, Hunt[place] Dec E on: 06/05/2022 03:36 PM   Modules accepted: Orders

## 2022-06-19 ENCOUNTER — Encounter: Payer: Medicare Other | Admitting: Podiatry

## 2022-06-19 ENCOUNTER — Other Ambulatory Visit: Payer: Medicare Other

## 2022-06-19 DIAGNOSIS — N390 Urinary tract infection, site not specified: Secondary | ICD-10-CM

## 2022-06-19 DIAGNOSIS — N289 Disorder of kidney and ureter, unspecified: Secondary | ICD-10-CM

## 2022-06-19 LAB — RENAL FUNCTION PANEL
Albumin: 4.5 g/dL (ref 3.9–4.9)
Creatinine, Ser: 1.04 mg/dL — ABNORMAL HIGH (ref 0.57–1.00)
Potassium: 4.8 mmol/L (ref 3.5–5.2)

## 2022-06-20 ENCOUNTER — Encounter: Payer: Self-pay | Admitting: Nurse Practitioner

## 2022-06-20 ENCOUNTER — Ambulatory Visit (INDEPENDENT_AMBULATORY_CARE_PROVIDER_SITE_OTHER): Payer: Medicare Other | Admitting: Nurse Practitioner

## 2022-06-20 VITALS — BP 124/82 | HR 64 | Wt 223.2 lb

## 2022-06-20 DIAGNOSIS — I10 Essential (primary) hypertension: Secondary | ICD-10-CM | POA: Diagnosis not present

## 2022-06-20 DIAGNOSIS — E782 Mixed hyperlipidemia: Secondary | ICD-10-CM | POA: Diagnosis not present

## 2022-06-20 DIAGNOSIS — K760 Fatty (change of) liver, not elsewhere classified: Secondary | ICD-10-CM

## 2022-06-20 DIAGNOSIS — Z6841 Body Mass Index (BMI) 40.0 and over, adult: Secondary | ICD-10-CM

## 2022-06-20 DIAGNOSIS — Z9889 Other specified postprocedural states: Secondary | ICD-10-CM

## 2022-06-20 DIAGNOSIS — F322 Major depressive disorder, single episode, severe without psychotic features: Secondary | ICD-10-CM

## 2022-06-20 DIAGNOSIS — R7303 Prediabetes: Secondary | ICD-10-CM

## 2022-06-20 DIAGNOSIS — F4323 Adjustment disorder with mixed anxiety and depressed mood: Secondary | ICD-10-CM

## 2022-06-20 LAB — RENAL FUNCTION PANEL
BUN/Creatinine Ratio: 20 (ref 12–28)
BUN: 21 mg/dL (ref 8–27)
CO2: 23 mmol/L (ref 20–29)
Calcium: 10.4 mg/dL — ABNORMAL HIGH (ref 8.7–10.3)
Chloride: 103 mmol/L (ref 96–106)
Glucose: 100 mg/dL — ABNORMAL HIGH (ref 70–99)
Phosphorus: 3.9 mg/dL (ref 3.0–4.3)
Sodium: 139 mmol/L (ref 134–144)
eGFR: 59 mL/min/{1.73_m2} — ABNORMAL LOW (ref >59)

## 2022-06-20 NOTE — Patient Instructions (Addendum)
Your kidney function is looking much better! I feel like the changes we saw earlier were directly related to the UTI you had at that time.   Your blood sugar is looking fantastic!! Keep up the great work!

## 2022-06-20 NOTE — Progress Notes (Signed)
Lauren Clamp, DNP, AGNP-c [facility]  [address] [place], [place] [address] [phone]  ESTABLISHED PATIENT- Chronic Health and/or Follow-Up Visit  Blood pressure 124/82, pulse 64, weight 223 lb 3.2 oz (101.2 kg).    Lauren Briggs is a 67 y.o. year old female presenting today for evaluation and management of chronic conditions.   Lauren Briggs presents today with several concerns.  Surgical Site She reports ongoing issues with a surgical site on her foot that has not healed properly since her surgery in March. She expresses concern and discomfort due to the persistent pain and lack of healing. She is scheduled to revisit her surgeon in two weeks as advised to allow more time for potential improvement. She is particularly concerned about the impact of her foot issue on her mobility and overall health.  Emotional Stress She also shares significant emotional stress related to personal matters, including the illness of her pet and limited contact with her son and grandchildren, which she feels may be impacting her health. Lauren Briggs mentions that her pet is dying, and she has not seen her son and grandchildren for six months, which has been a source of considerable distress.  Medications Regarding her medications, Lauren Briggs is currently taking Rybelsus for blood sugar control, which she mentions has been effective in reducing her A1C levels. She also discusses her previous experience with Ozempic, which she had to discontinue due to side effects. Additionally, she takes vitamin D supplements as recommended by her doctor to aid in the healing of her foot.  Lab Results Lauren Briggs's recent lab results indicate good control of her diabetes with an A1C of 5.6, and she notes improvements in her kidney function following a recent infection. However, she remains worried about the non-healing of her foot and the potential impact of her ongoing stress on her overall health.  All ROS negative with  exception of what is listed above.   PHYSICAL EXAM Physical Exam Vitals and nursing note reviewed.  Constitutional:      Appearance: Normal appearance.  HENT:     Head: Normocephalic.  Eyes:     Pupils: Pupils are equal, round, and reactive to light.  Cardiovascular:     Rate and Rhythm: Normal rate and regular rhythm.     Pulses: Normal pulses.     Heart sounds: Normal heart sounds.  Pulmonary:     Effort: Pulmonary effort is normal.     Breath sounds: Normal breath sounds.  Musculoskeletal:        General: Swelling and tenderness present. Normal range of motion.     Cervical back: Normal range of motion.  Skin:    General: Skin is warm.     Capillary Refill: Capillary refill takes less than 2 seconds.  Neurological:     General: No focal deficit present.     Mental Status: She is alert and oriented to person, place, and time.     Sensory: No sensory deficit.     Motor: Weakness present.     Gait: Gait abnormal.  Psychiatric:        Mood and Affect: Mood normal.     PLAN Problem List Items Addressed This Visit     Essential hypertension    Chronic hypertension currently managed with lisinopril-hydrochlorothiazide.  Blood pressure is stable today.  No alarm symptoms are present.  Labs pending.  Refills have been provided.      Prediabetes    Lauren Briggs is currently managing her blood sugars with Rybelsus, and reports  good control with a recent HbA1c of 5.6%. She has previously tried Ozempic but switched due to side effects. Plan: - Continue current management with Rybelsus. - Monitor blood glucose levels as per current treatment plan and follow up as needed to ensure continued control of diabetes.      Body mass index (BMI) 40.0-44.9, adult (HCC) - Primary    Continue working closely with [facility].       Adjustment disorder with mixed anxiety and depressed mood    Lauren Briggs is experiencing significant stress due to personal issues, which may be impacting her immune function  and overall health. She has noted increased stress due to her pet's illness and lack of contact with family members. Plan: - No specific plan discussed for stress management during this visit      Hyperlipidemia    Chronic hyperlipidemia in the setting of hypertension and prediabetes.  She is currently utilizing Zetia, fenofibrate, and simvastatin for control.  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   Plan: Continue current medications. Labs pending.   Continue to work on diet and exercise      Fatty liver    History of fatty liver.  She is currently working on diet and exercise to help manage this.  Labs reviewed.       S/P foot joint surgery    Lauren Briggs reports ongoing pain and lack of healing in her foot following surgery in March. She has expressed concerns about the slow healing process and has an upcoming appointment with her surgeon in two weeks. No signs of infection are present today.  Plan: - Continue to monitor the healing process. - Lauren Briggs is advised to follow up with her surgeon as scheduled to reassess the wound and discuss further management options.      Other Visit Diagnoses     Depression, major, single episode, severe (HCC)           No follow-ups on file. Time: 44 minutes, >50% spent counseling, care coordination, chart review, and documentation.    Lauren Clamp, DNP, AGNP-c 06/20/2022 10:00 AM

## 2022-06-21 ENCOUNTER — Other Ambulatory Visit: Payer: Self-pay | Admitting: Podiatry

## 2022-06-21 DIAGNOSIS — Z9889 Other specified postprocedural states: Secondary | ICD-10-CM

## 2022-07-01 ENCOUNTER — Other Ambulatory Visit (HOSPITAL_BASED_OUTPATIENT_CLINIC_OR_DEPARTMENT_OTHER): Payer: Self-pay | Admitting: Nurse Practitioner

## 2022-07-01 ENCOUNTER — Encounter: Payer: Self-pay | Admitting: Podiatry

## 2022-07-01 ENCOUNTER — Ambulatory Visit (INDEPENDENT_AMBULATORY_CARE_PROVIDER_SITE_OTHER): Payer: Medicare Other | Admitting: Podiatry

## 2022-07-01 ENCOUNTER — Other Ambulatory Visit: Payer: Self-pay | Admitting: Neurology

## 2022-07-01 ENCOUNTER — Ambulatory Visit (INDEPENDENT_AMBULATORY_CARE_PROVIDER_SITE_OTHER): Payer: Medicare Other

## 2022-07-01 DIAGNOSIS — M960 Pseudarthrosis after fusion or arthrodesis: Secondary | ICD-10-CM

## 2022-07-01 DIAGNOSIS — Z9889 Other specified postprocedural states: Secondary | ICD-10-CM

## 2022-07-01 NOTE — Progress Notes (Signed)
Chief Complaint  Patient presents with   Routine Post Op    DOS 04/11/22 --- REVISIONAL ARTHRODESIS 3RD TARSOMETATARSAL JOINT LEFT, REMOVAL OF ORTHOPEDIC HARDWARE 3RD TARSOMETATARSAL LEFT, HARVESTING OF BONE AUTOGRAFT LEFT CALCANEUS   "Its slowly getting there I think"    Subjective:  Patient presents today status post revisional third TMT arthrodesis with harvesting of bone autograft left calcaneus.  DOS: 04/11/2022.  Patient continues to ambulate in tennis shoes with assistance of a cane intermittently.  She says overall she does feel better since prior to surgery but she continues to have some intermittent achiness to the foot.  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   Neuromuscular disorder (HCC)    Pre-diabetes    Prediabetes 11/10/2018   Shortness of breath dyspnea     Past Surgical History:  Procedure Laterality Date   ABDOMINAL HYSTERECTOMY     PARTIAL   ANTERIOR LAT LUMBAR FUSION N/A 08/14/2018   Procedure: extreme lateral interbody fusion - Lumbar two-Lumbar three  cortical screws and removal pedicle screws Lumbar -Sacral one;  Surgeon: Donalee Citrin, MD;  Location: [facility] OR;  Service: Neurosurgery;  Laterality: N/A;   APPENDECTOMY     BACK SURGERY     times 4   CARPAL TUNNEL RELEASE     BIL    CESAREAN SECTION     PARTIAL HYST   CLOSED REDUCTION ULNAR SHAFT     COLONOSCOPY     FOOT SURGERY     LT FOOT CYST REMOVED    FOOT SURGERY Left 04/11/2022   HARDWARE REMOVAL N/A 08/14/2018   Procedure: Removal pedicle screws Lumbar -Sacral one with cortical screw fixation lumbar two-three;  Surgeon: Donalee Citrin, MD;  Location: [facility] OR;  Service: Neurosurgery;  Laterality: N/A;   NASAL SINUS SURGERY     1994    radial  head Right 01/14/2016   at [facility]   RADIAL HEAD ARTHROPLASTY Right 01/26/2016   Procedure: RIGHT RADIAL HEAD REPLACEMENT ARTHROPLASTY WITH LIGAMENT REPAIRS;  Surgeon: Dairl Ponder, MD;  Location: [facility];  Service: Orthopedics;  Laterality: Right;  Right elbow raial head replacement with possible ligament repairs as needed    UPPER GASTROINTESTINAL ENDOSCOPY      Allergies  Allergen Reactions   Versed [Midazolam] Other (See Comments)    Stroke   Iodine Nausea And Vomiting    During a myelogram    Lipitor [Atorvastatin] Other (See Comments)    headaches   Zocor [Simvastatin] Other (See Comments)    Reports leg cramps w/ doses over 20mg  daily.   Propoxyphene Hcl Palpitations    darvon     Objective/Physical Exam Neurovascular status intact.  Incision nicely healed.  Minimal edema noted.  There are some very mild tenderness with palpation throughout the foot  Radiographic Exam LT foot 07/01/2022:  Orthopedic stable noted crossing the second TMT which appears stable with routine healing.  Plate and screw fixation of the third TMT noted which is in good anatomical alignment.  Radiolucency throughout the calcaneus noted from the harvesting of the bone autograft.  No acute fractures identified.  Assessment: 1. s/p third TMT revisional arthrodesis left with harvesting of bone  autograft left calcaneus. DOS: 04/11/2022  -Patient evaluated.  X-rays reviewed - Continue WB in tennis shoes.  Patient may slowly wean off of the cane.  Currently no plan for physical therapy.  She is able to ambulate just fine with exception of achiness to the foot which is causing some slight compensatory pain to the right lower extremity. -Slowly increase activity.  Continue compression sleeve as needed -Return to clinic 3 months follow-up x-ray   Felecia Shelling, DPM [facility]  Dr. Felecia Shelling, DPM    [address] [place],  [place] [address]                Office [phone]  Fax [phone]

## 2022-07-03 ENCOUNTER — Ambulatory Visit: Payer: Medicare Other | Admitting: Neurology

## 2022-07-15 ENCOUNTER — Encounter: Payer: Self-pay | Admitting: Nurse Practitioner

## 2022-07-15 DIAGNOSIS — Z9889 Other specified postprocedural states: Secondary | ICD-10-CM | POA: Insufficient documentation

## 2022-07-15 NOTE — Assessment & Plan Note (Signed)
Lauren Briggs is experiencing significant stress due to personal issues, which may be impacting her immune function and overall health. She has noted increased stress due to her pet's illness and lack of contact with family members. Plan: - No specific plan discussed for stress management during this visit

## 2022-07-15 NOTE — Assessment & Plan Note (Signed)
History of fatty liver.  She is currently working on diet and exercise to help manage this.  Labs reviewed.

## 2022-07-15 NOTE — Assessment & Plan Note (Signed)
Lauren Briggs is currently managing her blood sugars with Rybelsus, and reports good control with a recent HbA1c of 5.6%. She has previously tried Ozempic but switched due to side effects. Plan: - Continue current management with Rybelsus. - Monitor blood glucose levels as per current treatment plan and follow up as needed to ensure continued control of diabetes.

## 2022-07-15 NOTE — Assessment & Plan Note (Signed)
Continue working closely with [facility].

## 2022-07-15 NOTE — Assessment & Plan Note (Signed)
Lauren Briggs reports ongoing pain and lack of healing in her foot following surgery in March. She has expressed concerns about the slow healing process and has an upcoming appointment with her surgeon in two weeks. No signs of infection are present today.  Plan: - Continue to monitor the healing process. - Lauren Briggs is advised to follow up with her surgeon as scheduled to reassess the wound and discuss further management options.

## 2022-07-15 NOTE — Assessment & Plan Note (Signed)
Chronic hypertension currently managed with lisinopril-hydrochlorothiazide.  Blood pressure is stable today.  No alarm symptoms are present.  Labs pending.  Refills have been provided. 

## 2022-07-15 NOTE — Assessment & Plan Note (Signed)
Chronic hyperlipidemia in the setting of hypertension and prediabetes.  She is currently utilizing Zetia, fenofibrate, and simvastatin for control.  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  Plan: Continue current medications. Labs pending.   Continue to work on diet and exercise 

## 2022-07-24 ENCOUNTER — Encounter: Payer: Self-pay | Admitting: Internal Medicine

## 2022-08-17 ENCOUNTER — Other Ambulatory Visit (HOSPITAL_BASED_OUTPATIENT_CLINIC_OR_DEPARTMENT_OTHER): Payer: Self-pay | Admitting: Nurse Practitioner

## 2022-08-17 DIAGNOSIS — I10 Essential (primary) hypertension: Secondary | ICD-10-CM

## 2022-08-24 ENCOUNTER — Other Ambulatory Visit: Payer: Self-pay | Admitting: Internal Medicine

## 2022-08-29 DIAGNOSIS — G5621 Lesion of ulnar nerve, right upper limb: Secondary | ICD-10-CM | POA: Insufficient documentation

## 2022-09-11 ENCOUNTER — Ambulatory Visit (INDEPENDENT_AMBULATORY_CARE_PROVIDER_SITE_OTHER): Payer: Medicare Other | Admitting: Podiatry

## 2022-09-11 ENCOUNTER — Ambulatory Visit (INDEPENDENT_AMBULATORY_CARE_PROVIDER_SITE_OTHER): Payer: Medicare Other

## 2022-09-11 ENCOUNTER — Ambulatory Visit: Payer: Medicare Other

## 2022-09-11 DIAGNOSIS — M778 Other enthesopathies, not elsewhere classified: Secondary | ICD-10-CM

## 2022-09-11 DIAGNOSIS — M19071 Primary osteoarthritis, right ankle and foot: Secondary | ICD-10-CM | POA: Diagnosis not present

## 2022-09-11 DIAGNOSIS — I639 Cerebral infarction, unspecified: Secondary | ICD-10-CM | POA: Insufficient documentation

## 2022-09-11 NOTE — Progress Notes (Signed)
Chief Complaint  Patient presents with   Foot Pain    Right foot pain x couple of months. No injuries Left foot is feeling feeling better but taking time.     Subjective:  Patient presents today status post revisional third TMT arthrodesis with harvesting of bone autograft left calcaneus.  DOS: 04/11/2022.  Patient states that she continues to improve.  She says that the pain is much better since prior to surgery.    Patient states that her right foot has progressively become more symptomatic and painful over the past year.  Most recently it has been exacerbated with increased walking.  She is concerned for the same arthritic condition to the right foot.  It is now painful on a daily basis.  Past Medical History:  Diagnosis Date   Acute cystitis with hematuria 01/20/2022   Allergy    Arthritis    Back pain    Complication of anesthesia    HX Back CHIPPED TOOTH S/P EXTUBATION    Hyperlipidemia    Hypertension    Low back pain radiating to right leg    Mixed hypercholesterolemia and 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   Neuromuscular disorder (HCC)    Pre-diabetes    Prediabetes 11/10/2018   Reactive depression (situational) 03/11/2022   Shortness of breath dyspnea     Past Surgical History:  Procedure Laterality Date   ABDOMINAL HYSTERECTOMY     PARTIAL   ANTERIOR LAT LUMBAR FUSION N/A 08/14/2018   Procedure: extreme lateral interbody fusion - Lumbar two-Lumbar three  cortical screws and removal pedicle screws Lumbar -Sacral one;  Surgeon: Donalee Citrin, MD;  Location: [facility] OR;  Service: Neurosurgery;  Laterality: N/A;   APPENDECTOMY     BACK SURGERY     times 4   CARPAL TUNNEL RELEASE     BIL    CESAREAN SECTION     PARTIAL HYST   CLOSED REDUCTION ULNAR SHAFT     COLONOSCOPY     FOOT SURGERY     LT FOOT CYST REMOVED    FOOT SURGERY Left 04/11/2022   HARDWARE REMOVAL N/A 08/14/2018   Procedure: Removal pedicle screws Lumbar -Sacral one with cortical screw fixation lumbar  two-three;  Surgeon: Donalee Citrin, MD;  Location: [facility] OR;  Service: Neurosurgery;  Laterality: N/A;   NASAL SINUS SURGERY     1994    radial head Right 01/14/2016   at [facility]   RADIAL HEAD ARTHROPLASTY Right 01/26/2016   Procedure: RIGHT RADIAL HEAD REPLACEMENT ARTHROPLASTY WITH LIGAMENT REPAIRS;  Surgeon: Dairl Ponder, MD;  Location: [facility];  Service: Orthopedics;  Laterality: Right;  Right elbow raial head replacement with possible ligament repairs as needed    UPPER GASTROINTESTINAL ENDOSCOPY      Allergies  Allergen Reactions   Versed [Midazolam] Other (See Comments)    Stroke   Iodine Nausea And Vomiting    During a myelogram    Lipitor [Atorvastatin] Other (See Comments)    headaches   Zocor [Simvastatin] Other (See Comments)    Reports leg cramps w/ doses over 20mg  daily.   Propoxyphene Hcl Palpitations    darvon     Objective/Physical Exam Neurovascular status intact.  Incision nicely healed.  Minimal edema noted.  There are some very mild tenderness with palpation throughout the foot There is significant tenderness and pain with palpation to the right midfoot now.  Tenderness is specifically associated to the second third TMT of the right foot.    Radiographic Exam LT foot  09/11/2022:  Orthopedic stable noted crossing the second TMT which appears stable with routine healing.  Plate and screw fixation of the third TMT noted which is in good anatomical alignment.  Radiolucency throughout the calcaneus noted from the harvesting of the bone autograft.  No acute fractures identified.  Impression: Stable with routine healing  Radiographic exam RT foot 09/11/2022 Normal osseous mineralization.  Degenerative changes noted throughout the second and third TMT of the right midfoot.  This correlates clinically with the patient's associated tenderness and pain.  No acute fractures identified.  Assessment: 1. s/p third TMT revisional arthrodesis left with harvesting  of bone autograft left calcaneus. DOS: 04/11/2022; stable with routine healing 2.  RT midfoot DJD/arthritis  -Patient evaluated.  X-rays reviewed - Continue WB in tennis shoes.   -The patient has increased pain and tenderness associated to the right foot.  She is concerned that her arthritis in the right foot is progressing and she may need surgery to the right foot.   -Order placed for CT scan RT foot wo contrast -Will plan to discuss results via telephone and determine a treatment plan which may include surgery pending findings of the CT scan   Felecia Shelling, DPM [facility]  Dr. Felecia Shelling, DPM    [address] [place], [place] [address]                Office [phone]  Fax [phone]

## 2022-09-24 ENCOUNTER — Ambulatory Visit
Admission: RE | Admit: 2022-09-24 | Discharge: 2022-09-24 | Disposition: A | Discharge status: Home / Self Care | Payer: Medicare Other | Source: Ambulatory Visit | Attending: Podiatry | Admitting: Podiatry

## 2022-09-24 DIAGNOSIS — M19071 Primary osteoarthritis, right ankle and foot: Secondary | ICD-10-CM

## 2022-09-25 ENCOUNTER — Ambulatory Visit (INDEPENDENT_AMBULATORY_CARE_PROVIDER_SITE_OTHER): Payer: Medicare Other | Admitting: Family Medicine

## 2022-09-25 ENCOUNTER — Other Ambulatory Visit: Payer: Self-pay | Admitting: Nurse Practitioner

## 2022-09-25 ENCOUNTER — Encounter: Payer: Self-pay | Admitting: Nurse Practitioner

## 2022-09-25 VITALS — BP 110/70 | HR 64 | Ht 64.0 in | Wt 218.2 lb

## 2022-09-25 DIAGNOSIS — K760 Fatty (change of) liver, not elsewhere classified: Secondary | ICD-10-CM

## 2022-09-25 DIAGNOSIS — I10 Essential (primary) hypertension: Secondary | ICD-10-CM

## 2022-09-25 DIAGNOSIS — R350 Frequency of micturition: Secondary | ICD-10-CM

## 2022-09-25 DIAGNOSIS — R7303 Prediabetes: Secondary | ICD-10-CM

## 2022-09-25 DIAGNOSIS — N390 Urinary tract infection, site not specified: Secondary | ICD-10-CM

## 2022-09-25 DIAGNOSIS — G629 Polyneuropathy, unspecified: Secondary | ICD-10-CM

## 2022-09-25 DIAGNOSIS — Z6841 Body Mass Index (BMI) 40.0 and over, adult: Secondary | ICD-10-CM

## 2022-09-25 DIAGNOSIS — E782 Mixed hyperlipidemia: Secondary | ICD-10-CM

## 2022-09-25 LAB — POCT URINALYSIS DIP (PROADVANTAGE DEVICE)
Blood, UA: NEGATIVE
Glucose, UA: NEGATIVE mg/dL
Ketones, POC UA: NEGATIVE mg/dL
Nitrite, UA: POSITIVE — AB
Protein Ur, POC: NEGATIVE mg/dL
Specific Gravity, Urine: 1.01
Urobilinogen, Ur: 0.2
pH, UA: 6.5 (ref 5.0–8.0)

## 2022-09-25 MED ORDER — SULFAMETHOXAZOLE-TRIMETHOPRIM 800-160 MG PO TABS: 1.0000 | ORAL_TABLET | Freq: Two times a day (BID) | ORAL | 0 refills | Status: DC

## 2022-09-25 MED ORDER — FLUCONAZOLE 150 MG PO TABS
150.0000 mg | ORAL_TABLET | Freq: Once | ORAL | 0 refills | Status: AC
Start: 2022-09-25 — End: 2022-09-25

## 2022-09-25 NOTE — Progress Notes (Signed)
   Subjective:    Patient ID: Lauren Briggs, female    DOB: 1955-03-12, 67 y.o.   MRN: [id number]  HPI She complains of frequency, urgency and dysuria.  She has a previous history of difficulty with UTIs and apparently was treated with Macrobid for an extended period of time.   Review of Systems     Objective:    Physical Exam Alert and in no distress.  Urine dipstick was positive.       Assessment & Plan:  Urinary frequency - Plan: POCT Urinalysis DIP (Proadvantage Device), sulfamethoxazole-trimethoprim (BACTRIM DS) 800-160 MG tablet, fluconazole (DIFLUCAN) 150 MG tablet I will treat her for 10 days with a different antibiotic and also give Diflucan just to be safe.  She will keep [place] informed as to how she is doing.

## 2022-10-02 ENCOUNTER — Ambulatory Visit (INDEPENDENT_AMBULATORY_CARE_PROVIDER_SITE_OTHER): Payer: Medicare Other | Admitting: Podiatry

## 2022-10-02 DIAGNOSIS — M19071 Primary osteoarthritis, right ankle and foot: Secondary | ICD-10-CM

## 2022-10-02 DIAGNOSIS — M960 Pseudarthrosis after fusion or arthrodesis: Secondary | ICD-10-CM | POA: Diagnosis not present

## 2022-10-02 MED ORDER — BETAMETHASONE SOD PHOS & ACET 6 (3-3) MG/ML IJ SUSP
3.0000 mg | Freq: Once | INTRAMUSCULAR | Status: AC
Start: 2022-10-02 — End: 2022-10-02
  Administered 2022-10-02: 3 mg via INTRA_ARTICULAR

## 2022-10-02 NOTE — Progress Notes (Signed)
Chief Complaint  Patient presents with   Foot Pain    Pt presents today  to discuss results  findings of the CT scan    Subjective:  Patient presents today status post revisional third TMT arthrodesis with harvesting of bone autograft left calcaneus.  DOS: 04/11/2022.  Left foot continues to demonstrate stability and improvement.  Last visit we discussed her pain and tenderness associated to the right midfoot.  CT scan was ordered.  She presents to review the CT scan results and discuss further treatment options  Past Medical History:  Diagnosis Date   Acute cystitis with hematuria 01/20/2022   Allergy    Arthritis    Back pain    Complication of anesthesia    HX Back CHIPPED TOOTH S/P EXTUBATION    Hyperlipidemia    Hypertension    Low back pain radiating to right leg    Mixed hypercholesterolemia and 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   Neuromuscular disorder (HCC)    Pre-diabetes    Prediabetes 11/10/2018   Reactive depression (situational) 03/11/2022   Shortness of breath dyspnea     Past Surgical History:  Procedure Laterality Date   ABDOMINAL HYSTERECTOMY     PARTIAL   ANTERIOR LAT LUMBAR FUSION N/A 08/14/2018   Procedure: extreme lateral interbody fusion - Lumbar two-Lumbar three  cortical screws and removal pedicle screws Lumbar -Sacral one;  Surgeon: Donalee Citrin, MD;  Location: [facility] OR;  Service: Neurosurgery;  Laterality: N/A;   APPENDECTOMY     BACK SURGERY     times 4   CARPAL TUNNEL RELEASE     BIL    CESAREAN SECTION     PARTIAL HYST   CLOSED REDUCTION ULNAR SHAFT     COLONOSCOPY     FOOT SURGERY     LT FOOT CYST REMOVED    FOOT SURGERY Left 04/11/2022   HARDWARE REMOVAL N/A 08/14/2018   Procedure: Removal pedicle screws Lumbar -Sacral one with cortical screw fixation lumbar two-three;  Surgeon: Donalee Citrin, MD;  Location: [facility] OR;  Service: Neurosurgery;  Laterality: N/A;   NASAL SINUS SURGERY     1994    radial head Right 01/14/2016   at [facility]   RADIAL  HEAD ARTHROPLASTY Right 01/26/2016   Procedure: RIGHT RADIAL HEAD REPLACEMENT ARTHROPLASTY WITH LIGAMENT REPAIRS;  Surgeon: Dairl Ponder, MD;  Location: [facility];  Service: Orthopedics;  Laterality: Right;  Right elbow raial head replacement with possible ligament repairs as needed    UPPER GASTROINTESTINAL ENDOSCOPY      Allergies  Allergen Reactions   Versed [Midazolam] Other (See Comments)    Stroke   Iodine Nausea And Vomiting    During a myelogram    Lipitor [Atorvastatin] Other (See Comments)    headaches   Zocor [Simvastatin] Other (See Comments)    Reports leg cramps w/ doses over 20mg  daily.   Propoxyphene Hcl Palpitations    darvon     Objective/Physical Exam Neurovascular status intact.  Incision nicely healed.  Minimal edema noted.  There are some very mild tenderness with palpation throughout the foot There is significant tenderness and pain with palpation to the right midfoot now.  Tenderness is specifically associated to the second third TMT of the right foot.    Radiographic Exam LT foot 09/11/2022:  Orthopedic stable noted crossing the second TMT which appears stable with routine healing.  Plate and screw fixation of the third TMT noted which is in good anatomical alignment.  Radiolucency throughout the calcaneus noted  from the harvesting of the bone autograft.  No acute fractures identified.  Impression: Stable with routine healing  Radiographic exam RT foot 09/11/2022 Normal osseous mineralization.  Degenerative changes noted throughout the second and third TMT of the right midfoot.  This correlates clinically with the patient's associated tenderness and pain.  No acute fractures identified.  CT FOOT RIGHT WO CONTRAST 09/24/2022 Pending final read  Assessment: 1. s/p third TMT revisional arthrodesis left with harvesting of bone autograft left calcaneus. DOS: 04/11/2022; stable with routine healing 2.  RT midfoot DJD/arthritis  -Patient  evaluated.  CT scan reviewed personally by myself today.  There are arthritic degenerative changes noted specifically to the second and third TMT with osseous cystic changes also noted around the joint. -For now we are going to hold off on any surgical intervention at this time.  Pursue conservative treatment.  Injection of 0.5 cc Celestone Soluspan injected into the right midfoot around the second TMT - Continue WB in tennis shoes.   -RTC PRN   Felecia Shelling, DPM [facility]  Dr. Felecia Shelling, DPM    [address] [place], [place] [address]                Office [phone]  Fax [phone]

## 2022-11-05 ENCOUNTER — Ambulatory Visit (INDEPENDENT_AMBULATORY_CARE_PROVIDER_SITE_OTHER): Payer: Medicare Other | Admitting: Podiatry

## 2022-11-05 VITALS — BP 166/68 | HR 59

## 2022-11-05 DIAGNOSIS — M19071 Primary osteoarthritis, right ankle and foot: Secondary | ICD-10-CM

## 2022-11-07 ENCOUNTER — Encounter: Payer: Self-pay | Admitting: Podiatry

## 2022-11-08 ENCOUNTER — Telehealth: Payer: Self-pay | Admitting: Podiatry

## 2022-11-08 MED ORDER — MELOXICAM 15 MG PO TABS: 15.0000 mg | ORAL_TABLET | Freq: Every day | ORAL | 1 refills | Status: DC

## 2022-11-08 MED ORDER — METHYLPREDNISOLONE 4 MG PO TBPK: ORAL_TABLET | ORAL | 0 refills | Status: DC

## 2022-11-08 NOTE — Telephone Encounter (Signed)
Sent. - Dr. Margarito Dehaas

## 2022-11-08 NOTE — Telephone Encounter (Signed)
Pt stated that she was suppose to give her 2 prescriptions yesterday when she saw you, can you send those in for her

## 2022-11-11 NOTE — Progress Notes (Signed)
Chief Complaint  Patient presents with   Foot Pain    "It's not any better.  I just need to decide when is a good time for surgery."    Subjective:  Patient presents today status post revisional third TMT arthrodesis with harvesting of bone autograft left calcaneus.  DOS: 04/11/2022.  Patient states that her left foot is doing very well.  She is ambulating on her left foot with minimal pain or tenderness  Patient continues to have chronic pain and tenderness associated to the right foot now.  This has been ongoing for several years despite conservative treatment.  Patient states that now would be a good time to pursue her right foot surgery especially since conservative care has failed to alleviate any of her symptoms.  Past Medical History:  Diagnosis Date   Acute cystitis with hematuria 01/20/2022   Allergy    Arthritis    Back pain    Complication of anesthesia    HX Back CHIPPED TOOTH S/P EXTUBATION    Hyperlipidemia    Hypertension    Low back pain radiating to right leg    Mixed hypercholesterolemia and 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   Neuromuscular disorder (HCC)    Pre-diabetes    Prediabetes 11/10/2018   Reactive depression (situational) 03/11/2022   Shortness of breath dyspnea     Past Surgical History:  Procedure Laterality Date   ABDOMINAL HYSTERECTOMY     PARTIAL   ANTERIOR LAT LUMBAR FUSION N/A 08/14/2018   Procedure: extreme lateral interbody fusion - Lumbar two-Lumbar three  cortical screws and removal pedicle screws Lumbar -Sacral one;  Surgeon: Donalee Citrin, MD;  Location: [facility] OR;  Service: Neurosurgery;  Laterality: N/A;   APPENDECTOMY     BACK SURGERY     times 4   CARPAL TUNNEL RELEASE     BIL    CESAREAN SECTION     PARTIAL HYST   CLOSED REDUCTION ULNAR SHAFT     COLONOSCOPY     FOOT SURGERY     LT FOOT CYST REMOVED    FOOT SURGERY Left 04/11/2022   HARDWARE REMOVAL N/A 08/14/2018   Procedure: Removal pedicle screws Lumbar -Sacral one with  cortical screw fixation lumbar two-three;  Surgeon: Donalee Citrin, MD;  Location: [facility] OR;  Service: Neurosurgery;  Laterality: N/A;   NASAL SINUS SURGERY     1994    radial head Right 01/14/2016   at [facility]   RADIAL HEAD ARTHROPLASTY Right 01/26/2016   Procedure: RIGHT RADIAL HEAD REPLACEMENT ARTHROPLASTY WITH LIGAMENT REPAIRS;  Surgeon: Dairl Ponder, MD;  Location: [facility];  Service: Orthopedics;  Laterality: Right;  Right elbow raial head replacement with possible ligament repairs as needed    UPPER GASTROINTESTINAL ENDOSCOPY      Allergies  Allergen Reactions   Versed [Midazolam] Other (See Comments)    Stroke   Iodine Nausea And Vomiting    During a myelogram    Lipitor [Atorvastatin] Other (See Comments)    headaches   Zocor [Simvastatin] Other (See Comments)    Reports leg cramps w/ doses over 20mg  daily.   Propoxyphene Hcl Palpitations    darvon     Objective/Physical Exam Neurovascular status intact.  Incision nicely healed.  No edema to the left foot.  No tenderness or pain with palpation as well. There is tenderness with palpation and range of motion of the right midfoot.  Pain is mostly isolated around the second and third TMT similar to the left foot preoperatively.  Radiographic Exam LT foot 09/11/2022:  Orthopedic stable noted crossing the second TMT which appears stable with routine healing.  Plate and screw fixation of the third TMT noted which is in good anatomical alignment.  Radiolucency throughout the calcaneus noted from the harvesting of the bone autograft.  No acute fractures identified.  Impression: Stable with routine healing  Radiographic exam RT foot 09/11/2022 Normal osseous mineralization.  Degenerative changes noted throughout the second and third TMT of the right midfoot.  This correlates clinically with the patient's associated tenderness and pain.  No acute fractures identified.  CT FOOT RIGHT WO CONTRAST  09/24/2022 FINDINGS: Bones/Joint/Cartilage Generalized osteopenia. No fracture or dislocation. Normal alignment. Small joint effusion. Synovial mineralization along the posterolateral ankle joint. Small plantar calcaneal spur.  Mild osteoarthritis of the talonavicular joint. Mild osteoarthritisof the tibiotalar joint. Mild osteoarthritis of the first TMT joint. Severe osteoarthritis of the second TMT joint. Moderate osteoarthritis of the third TMT joint. Mild osteoarthritis of the fourth TMT joint. Mild osteoarthritis of the navicular-medial cuneiform joint. Mild osteoarthritis of the navicular-middle cuneiform joint. Mild osteoarthritis of the first MTP joint.  Assessment: 1. s/p third TMT revisional arthrodesis left with harvesting of bone autograft left calcaneus. DOS: 04/11/2022; stable with routine healing 2.  RT midfoot DJD/arthritis as described above  -Patient evaluated.  CT scan reviewed  -Unfortunately patient has not had any alleviation of her  arthritic symptoms to the right foot despite conservative care.  We have tried arch supports/orthotics, shoe gear modifications, oral anti-inflammatories, steroid injections and she continues to have pain and tenderness associated to the foot. -Patient is doing very well with her left foot.  She says that the pain has mostly resolved and she feels significantly better. -Today we discussed surgical intervention to the right foot.  Patient states that now is a good time to pursue surgery.  Risk benefits advantages and disadvantages of the procedure were explained in detail to the patient.  No guarantees were expressed or implied.  Patient understands that she will be strictly nonweightbearing for approximately 6-8 weeks postoperatively similar to the left foot surgery.  All patient questions answered.  The patient consents to the plan to proceed with surgery -Authorization for surgery was initiated today.  Surgery will consist of second and third TMT  arthrodesis right -Return to clinic 1 week postop  Felecia Shelling, DPM [facility]  Dr. Felecia Shelling, DPM    [address] [place], [place] [address]                Office [phone]  Fax [phone]

## 2022-11-12 ENCOUNTER — Encounter: Payer: Self-pay | Admitting: Podiatry

## 2022-11-13 NOTE — Telephone Encounter (Signed)
Lauren Briggs can you update this patient on surgery date? Thanks! -Dr. Logan Bores

## 2022-11-14 ENCOUNTER — Ambulatory Visit (INDEPENDENT_AMBULATORY_CARE_PROVIDER_SITE_OTHER): Payer: Medicare Other | Admitting: Nurse Practitioner

## 2022-11-14 ENCOUNTER — Encounter: Payer: Self-pay | Admitting: Nurse Practitioner

## 2022-11-14 VITALS — BP 130/70 | HR 60 | Ht 64.0 in | Wt 211.2 lb

## 2022-11-14 DIAGNOSIS — E782 Mixed hyperlipidemia: Secondary | ICD-10-CM

## 2022-11-14 DIAGNOSIS — F418 Other specified anxiety disorders: Secondary | ICD-10-CM

## 2022-11-14 DIAGNOSIS — Z9889 Other specified postprocedural states: Secondary | ICD-10-CM

## 2022-11-14 DIAGNOSIS — I10 Essential (primary) hypertension: Secondary | ICD-10-CM

## 2022-11-14 DIAGNOSIS — R7303 Prediabetes: Secondary | ICD-10-CM

## 2022-11-14 DIAGNOSIS — Z6841 Body Mass Index (BMI) 40.0 and over, adult: Secondary | ICD-10-CM

## 2022-11-14 DIAGNOSIS — F4323 Adjustment disorder with mixed anxiety and depressed mood: Secondary | ICD-10-CM

## 2022-11-14 MED ORDER — TRAZODONE HCL 50 MG PO TABS
50.0000 mg | ORAL_TABLET | Freq: Every day | ORAL | 3 refills | Status: DC
Start: 2022-11-14 — End: 2023-10-28

## 2022-11-14 MED ORDER — EZETIMIBE 10 MG PO TABS
10.0000 mg | ORAL_TABLET | Freq: Every day | ORAL | 3 refills | Status: DC
Start: 2022-11-14 — End: 2023-10-28

## 2022-11-14 MED ORDER — ESCITALOPRAM OXALATE 10 MG PO TABS
10.0000 mg | ORAL_TABLET | Freq: Every day | ORAL | 3 refills | Status: DC
Start: 2022-11-14 — End: 2023-10-31

## 2022-11-14 MED ORDER — LISINOPRIL-HYDROCHLOROTHIAZIDE 20-12.5 MG PO TABS
1.0000 | ORAL_TABLET | Freq: Every day | ORAL | 3 refills | Status: DC
Start: 2022-11-14 — End: 2023-10-28

## 2022-11-14 MED ORDER — SIMVASTATIN 20 MG PO TABS
20.0000 mg | ORAL_TABLET | Freq: Every day | ORAL | 3 refills | Status: DC
Start: 2022-11-14 — End: 2023-10-28

## 2022-11-14 NOTE — Assessment & Plan Note (Signed)
Patient is on Lisinopril-HCTZ with one refill left. BP stable today. Labs pending.  -Refill Lisinopril-HCTZ prescription.

## 2022-11-14 NOTE — Assessment & Plan Note (Signed)
Patient has been on Simvastatin 20mg  and Ezetimibe (Zetia). She is due for a cholesterol labs. No alarm symptoms. She has successfully lost 24 lbs since February. -Continue Simvastatin 20mg  and Ezetimibe. -Order cholesterol test today.

## 2022-11-14 NOTE — Assessment & Plan Note (Signed)
Family dynamics and estrangement from her son and grandsons has contributed to increased stressors recently. She continues on escitalopram with good success and is seeing a Veterinary surgeon. She reports improvement in her mood and ability to manage the situation. No alarm symptoms present.  -Continue escitalopram and therapy

## 2022-11-14 NOTE — Patient Instructions (Signed)
You are doing great!

## 2022-11-14 NOTE — Assessment & Plan Note (Signed)
Patient has had multiple surgeries on her feet due to severe arthritis. She is planning another surgery on her right foot. -Continue current pain management plan.

## 2022-11-14 NOTE — Progress Notes (Signed)
Lauren Clamp, DNP, AGNP-c [facility]  [address] [place], [place] [address] [phone]  ESTABLISHED PATIENT- Chronic Health and/or Follow-Up Visit  Blood pressure 130/70, pulse 60, height 5\' 4"  (1.626 m), weight 211 lb 3.2 oz (95.8 kg).    Lauren Briggs is a 67 y.o. year old female presenting today for evaluation and management of chronic conditions.   History of Present Illness Lauren Briggs presents today for a routine check-up and medication refills. She reports a significant weight loss of 24 pounds, which she attributes to  Zepbound, started in February along with diet and exercise. She notes that the weight loss has been gradual, and she feels better overall.    She continues to experience foot pain due to severe arthritis. She has undergone two surgeries on the left foot, the most recent in March, due to a non-healing metatarsal following the first surgery. She reports improvement in pain in the left foot post-surgery but now reports increasing pain in the right foot. She is scheduled for a surgical consultation for the right foot in the near future and is eager to have this done.  She also reports ongoing emotional stress related to family issues, specifically estrangement from her son, Lauren Briggs, and her grandsons Lauren Briggs and Lauren Briggs. She has been seeking counseling for this issue and reports some improvement in her emotional state. She continues to take escitalopram (Lexapro) for this issue and feels it is helpful.  [name] is proactive in managing her health, scheduling regular mammograms and keeping up with her medication regimen. She expresses a desire to travel and lead an active lifestyle once her foot issues are resolved. She and her husband Lauren Briggs have plans to go on river cruises in [place] next summer.  All ROS negative with exception of what is listed above.   PHYSICAL EXAM Physical Exam Vitals and nursing note reviewed.  Constitutional:      General: She is  not in acute distress.    Appearance: Normal appearance.  HENT:     Head: Normocephalic.  Eyes:     Conjunctiva/sclera: Conjunctivae normal.  Neck:     Vascular: No carotid bruit.  Cardiovascular:     Rate and Rhythm: Normal rate and regular rhythm.     Pulses: Normal pulses.     Heart sounds: Normal heart sounds. No murmur heard. Pulmonary:     Effort: Pulmonary effort is normal.     Breath sounds: Normal breath sounds.  Musculoskeletal:     Right lower leg: No edema.     Left lower leg: No edema.  Skin:    General: Skin is warm and dry.     Capillary Refill: Capillary refill takes less than 2 seconds.  Neurological:     General: No focal deficit present.     Mental Status: She is alert and oriented to person, place, and time.     Motor: No weakness.  Psychiatric:        Mood and Affect: Mood normal.      PLAN Problem List Items Addressed This Visit     Mixed hypercholesterolemia and hypertriglyceridemia    Patient has been on Simvastatin 20mg  and Ezetimibe (Zetia). She is due for a cholesterol labs. No alarm symptoms. She has successfully lost 24 lbs since February. -Continue Simvastatin 20mg  and Ezetimibe. -Order cholesterol test today.      Relevant Medications   simvastatin (ZOCOR) 20 MG tablet   lisinopril-hydrochlorothiazide (ZESTORETIC) 20-12.5 MG tablet   ezetimibe (ZETIA) 10 MG tablet  Essential hypertension    Patient is on Lisinopril-HCTZ with one refill left. BP stable today. Labs pending.  -Refill Lisinopril-HCTZ prescription.      Relevant Medications   simvastatin (ZOCOR) 20 MG tablet   lisinopril-hydrochlorothiazide (ZESTORETIC) 20-12.5 MG tablet   ezetimibe (ZETIA) 10 MG tablet   Other Relevant Orders   Hemoglobin A1c   Lipid panel   Comprehensive metabolic panel   Body mass index (BMI) 40.0-44.9, adult (HCC)    Weight loss of more than 24lbs since Feb/March of this year with the help of diet, exercise, and Zepbound. She is doing very well  on her regimen. Once her foot surgery is complete and the area healed, I am hopeful she will see an increase in her weight loss with increased activity. Patient praised for her efforts and success. -Continue to work on [employer] and increased activity.       Adjustment disorder with mixed anxiety and depressed mood    Family dynamics and estrangement from her son and grandsons has contributed to increased stressors recently. She continues on escitalopram with good success and is seeing a Veterinary surgeon. She reports improvement in her mood and ability to manage the situation. No alarm symptoms present.  -Continue escitalopram and therapy       Hyperlipidemia    Patient has been on Simvastatin 20mg  and Ezetimibe (Zetia). She is due for a cholesterol labs. No alarm symptoms. She has successfully lost 24 lbs since February. -Continue Simvastatin 20mg  and Ezetimibe. -Order cholesterol test today.       Relevant Medications   simvastatin (ZOCOR) 20 MG tablet   lisinopril-hydrochlorothiazide (ZESTORETIC) 20-12.5 MG tablet   ezetimibe (ZETIA) 10 MG tablet   Other Relevant Orders   Hemoglobin A1c   Lipid panel   Comprehensive metabolic panel   S/P foot joint surgery    Patient has had multiple surgeries on her feet due to severe arthritis. She is planning another surgery on her right foot. -Continue current pain management plan.      Prediabetes - Primary   Relevant Orders   Hemoglobin A1c   Lipid panel   Comprehensive metabolic panel   Other Visit Diagnoses     Other specified anxiety disorders       Relevant Medications   escitalopram (LEXAPRO) 10 MG tablet   traZODone (DESYREL) 50 MG tablet   Situational anxiety       Relevant Medications   escitalopram (LEXAPRO) 10 MG tablet   traZODone (DESYREL) 50 MG tablet       Return in about 6 months (around 05/15/2023) for Med Management 30.  Lauren Clamp, DNP, AGNP-c

## 2022-11-14 NOTE — Assessment & Plan Note (Signed)
Weight loss of more than 24lbs since Feb/March of this year with the help of diet, exercise, and Zepbound. She is doing very well on her regimen. Once her foot surgery is complete and the area healed, I am hopeful she will see an increase in her weight loss with increased activity. Patient praised for her efforts and success. -Continue to work on [employer] and increased activity.

## 2022-11-15 LAB — COMPREHENSIVE METABOLIC PANEL
ALT: 18 [IU]/L (ref 0–32)
AST: 17 [IU]/L (ref 0–40)
Albumin: 4.6 g/dL (ref 3.9–4.9)
Alkaline Phosphatase: 48 [IU]/L (ref 44–121)
BUN/Creatinine Ratio: 30 — ABNORMAL HIGH (ref 12–28)
BUN: 26 mg/dL (ref 8–27)
Bilirubin Total: 0.4 mg/dL (ref 0.0–1.2)
CO2: 20 mmol/L (ref 20–29)
Calcium: 10.2 mg/dL (ref 8.7–10.3)
Chloride: 101 mmol/L (ref 96–106)
Creatinine, Ser: 0.87 mg/dL (ref 0.57–1.00)
Globulin, Total: 2.1 g/dL (ref 1.5–4.5)
Glucose: 94 mg/dL (ref 70–99)
Potassium: 4.4 mmol/L (ref 3.5–5.2)
Sodium: 139 mmol/L (ref 134–144)
Total Protein: 6.7 g/dL (ref 6.0–8.5)
eGFR: 73 mL/min/{1.73_m2} (ref >59)

## 2022-11-15 LAB — HEMOGLOBIN A1C
Est. average glucose Bld gHb Est-mCnc: 120 mg/dL
Hgb A1c MFr Bld: 5.8 % — ABNORMAL HIGH (ref 4.8–5.6)

## 2022-11-15 LAB — LIPID PANEL
Chol/HDL Ratio: 3.8 ratio (ref 0.0–4.4)
Cholesterol, Total: 251 mg/dL — ABNORMAL HIGH (ref 100–199)
HDL: 66 mg/dL (ref >39)
LDL Chol Calc (NIH): 161 mg/dL — ABNORMAL HIGH (ref 0–99)
Triglycerides: 135 mg/dL (ref 0–149)
VLDL Cholesterol Cal: 24 mg/dL (ref 5–40)

## 2023-01-02 ENCOUNTER — Encounter: Payer: Self-pay | Admitting: Podiatry

## 2023-01-02 ENCOUNTER — Telehealth: Payer: Self-pay | Admitting: Podiatry

## 2023-01-06 ENCOUNTER — Encounter: Payer: Self-pay | Admitting: Podiatry

## 2023-01-06 ENCOUNTER — Telehealth: Payer: Self-pay | Admitting: Urology

## 2023-01-06 ENCOUNTER — Ambulatory Visit (INDEPENDENT_AMBULATORY_CARE_PROVIDER_SITE_OTHER): Payer: Medicare Other | Admitting: Podiatry

## 2023-01-06 DIAGNOSIS — M722 Plantar fascial fibromatosis: Secondary | ICD-10-CM | POA: Diagnosis not present

## 2023-01-06 NOTE — Telephone Encounter (Signed)
Spoke with pt, she would like to cxl her sx with Dr. Logan Bores on 01/30/23 stated that they spoke at her appt with him today, she is going to try orthotics first and then if sx needs to be reschedule she will call back. I have informed Aram Beecham with [facility] and Dr. Logan Bores of this change.

## 2023-01-06 NOTE — Progress Notes (Signed)
Chief Complaint  Patient presents with   Routine Post Op    Patient states that since her last visit her feet have been annoying , and patient wants to talk to doctor about her right foot about surgery . Patient states her left foot is like a stone bruise patient states . When it gets bad enough she takes tylenol .    Subjective:  Patient presents today status post revisional third TMT arthrodesis with harvesting of bone autograft left calcaneus.  DOS: 04/11/2022.  Patient states that her left foot is doing very well.  She is ambulating on her left foot with minimal pain or tenderness  Patient continues to have chronic pain and tenderness associated to the right foot now.  This has been ongoing for several years despite conservative treatment.  Patient states that now would be a good time to pursue her right foot surgery especially since conservative care has failed to alleviate any of her symptoms.  Past Medical History:  Diagnosis Date   Acute cystitis with hematuria 01/20/2022   Allergy    Arthritis    Back pain    Complication of anesthesia    HX Back CHIPPED TOOTH S/P EXTUBATION    Hyperlipidemia    Hypertension    Low back pain radiating to right leg    Mixed hypercholesterolemia and 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   Neuromuscular disorder (HCC)    Pre-diabetes    Prediabetes 11/10/2018   Reactive depression (situational) 03/11/2022   Shortness of breath dyspnea     Past Surgical History:  Procedure Laterality Date   ABDOMINAL HYSTERECTOMY     PARTIAL   ANTERIOR LAT LUMBAR FUSION N/A 08/14/2018   Procedure: extreme lateral interbody fusion - Lumbar two-Lumbar three  cortical screws and removal pedicle screws Lumbar -Sacral one;  Surgeon: Donalee Citrin, MD;  Location: [facility] OR;  Service: Neurosurgery;  Laterality: N/A;   APPENDECTOMY     BACK SURGERY     times 4   CARPAL TUNNEL RELEASE     BIL    CESAREAN SECTION     PARTIAL HYST   CLOSED REDUCTION ULNAR SHAFT      COLONOSCOPY     FOOT SURGERY     LT FOOT CYST REMOVED    FOOT SURGERY Left 04/11/2022   HARDWARE REMOVAL N/A 08/14/2018   Procedure: Removal pedicle screws Lumbar -Sacral one with cortical screw fixation lumbar two-three;  Surgeon: Donalee Citrin, MD;  Location: [facility] OR;  Service: Neurosurgery;  Laterality: N/A;   NASAL SINUS SURGERY     1994    radial head Right 01/14/2016   at [facility]   RADIAL HEAD ARTHROPLASTY Right 01/26/2016   Procedure: RIGHT RADIAL HEAD REPLACEMENT ARTHROPLASTY WITH LIGAMENT REPAIRS;  Surgeon: Dairl Ponder, MD;  Location:  SURGERY CENTER;  Service: Orthopedics;  Laterality: Right;  Right elbow raial head replacement with possible ligament repairs as needed    UPPER GASTROINTESTINAL ENDOSCOPY      Allergies  Allergen Reactions   Versed [Midazolam] Other (See Comments)    Stroke   Iodine Nausea And Vomiting    During a myelogram    Lipitor [Atorvastatin] Other (See Comments)    headaches   Zocor [Simvastatin] Other (See Comments)    Reports leg cramps w/ doses over 20mg  daily.   Propoxyphene Hcl Palpitations    darvon     Objective/Physical Exam Neurovascular status intact.  Incision nicely healed.  No edema to the left foot.  No tenderness or pain with palpation  as well. There is tenderness with palpation and range of motion of the right midfoot.  Pain is mostly isolated around the second and third TMT similar to the left foot preoperatively.  Radiographic Exam LT foot 09/11/2022:  Orthopedic stable noted crossing the second TMT which appears stable with routine healing.  Plate and screw fixation of the third TMT noted which is in good anatomical alignment.  Radiolucency throughout the calcaneus noted from the harvesting of the bone autograft.  No acute fractures identified.  Impression: Stable with routine healing  Radiographic exam RT foot 09/11/2022 Normal osseous mineralization.  Degenerative changes noted throughout the second and third TMT of  the right midfoot.  This correlates clinically with the patient's associated tenderness and pain.  No acute fractures identified.  CT FOOT RIGHT WO CONTRAST 09/24/2022 FINDINGS: Bones/Joint/Cartilage Generalized osteopenia. No fracture or dislocation. Normal alignment. Small joint effusion. Synovial mineralization along the posterolateral ankle joint. Small plantar calcaneal spur.  Mild osteoarthritis of the talonavicular joint. Mild osteoarthritisof the tibiotalar joint. Mild osteoarthritis of the first TMT joint. Severe osteoarthritis of the second TMT joint. Moderate osteoarthritis of the third TMT joint. Mild osteoarthritis of the fourth TMT joint. Mild osteoarthritis of the navicular-medial cuneiform joint. Mild osteoarthritis of the navicular-middle cuneiform joint. Mild osteoarthritis of the first MTP joint.  Assessment: 1. s/p third TMT revisional arthrodesis left with harvesting of bone autograft left calcaneus. DOS: 04/11/2022; stable with routine healing 2.  RT midfoot DJD/arthritis as described above 3.  Plantar fasciitis bilateral  -Patient evaluated. -Last visit on 08/05/2022 we had scheduled midfoot arthrodesis of the right lower extremity.  She says that recently is only hurts occasionally and is tolerable with OTC anti-inflammatories or Tylenol. -The patient has had increased heel pain and tenderness now for a few months now. -Patient declined anti-inflammatory or cortisone injections -I do believe the patient would benefit from custom molded orthotics.  This would not only help support the medial longitudinal arch of the midfoot but also help to alleviate pressure from the heels.  This was discussed with the patient and she would like to pursue this option -Appointment with orthotics department for custom insoles -Return to clinic about 6 weeks after wearing her orthotics  Felecia Shelling, DPM [facility]  Dr. Felecia Shelling, DPM    [address] [place], [place] [address]                Office [phone]  Fax [phone]

## 2023-01-10 ENCOUNTER — Telehealth: Payer: Medicare Other | Admitting: Nurse Practitioner

## 2023-01-10 ENCOUNTER — Encounter: Payer: Self-pay | Admitting: Nurse Practitioner

## 2023-01-10 VITALS — Wt 220.0 lb

## 2023-01-10 DIAGNOSIS — H269 Unspecified cataract: Secondary | ICD-10-CM | POA: Diagnosis not present

## 2023-01-10 DIAGNOSIS — N3090 Cystitis, unspecified without hematuria: Secondary | ICD-10-CM | POA: Diagnosis not present

## 2023-01-10 DIAGNOSIS — N390 Urinary tract infection, site not specified: Secondary | ICD-10-CM

## 2023-01-10 DIAGNOSIS — R350 Frequency of micturition: Secondary | ICD-10-CM

## 2023-01-10 LAB — POCT URINALYSIS DIP (CLINITEK)
Blood, UA: NEGATIVE
Glucose, UA: 250 mg/dL — AB
Nitrite, UA: POSITIVE — AB
Spec Grav, UA: 1.02 (ref 1.010–1.025)
Urobilinogen, UA: 2 U/dL — AB
pH, UA: 6 (ref 5.0–8.0)

## 2023-01-10 MED ORDER — PHENAZOPYRIDINE HCL 200 MG PO TABS: 200.0000 mg | ORAL_TABLET | Freq: Three times a day (TID) | ORAL | 0 refills | Status: DC | PRN

## 2023-01-10 MED ORDER — NITROFURANTOIN MONOHYD MACRO 100 MG PO CAPS: 100.0000 mg | ORAL_CAPSULE | Freq: Two times a day (BID) | ORAL | 0 refills | Status: DC

## 2023-01-15 LAB — URINE CULTURE

## 2023-01-21 DIAGNOSIS — N3090 Cystitis, unspecified without hematuria: Secondary | ICD-10-CM | POA: Insufficient documentation

## 2023-01-21 DIAGNOSIS — N390 Urinary tract infection, site not specified: Secondary | ICD-10-CM | POA: Insufficient documentation

## 2023-01-21 DIAGNOSIS — H269 Unspecified cataract: Secondary | ICD-10-CM | POA: Insufficient documentation

## 2023-01-21 NOTE — Assessment & Plan Note (Signed)
 Recurrent UTIs with symptoms of urgency, spasms, and discomfort since Wednesday. 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 Previous treatments with nitrofurantoin  and sulfamethoxazole  have been effective. Patient prefers nitrofurantoin  due to smaller pill size and effectiveness. Discussed risks of yeast infections with antibiotic use and need for culture to confirm treatment. - Prescribe nitrofurantoin  - Order urine culture - Prescribe Pyridium  for symptom relief - Advise to report if symptoms do not improve or if culture results suggest a different treatment

## 2023-01-21 NOTE — Assessment & Plan Note (Signed)
 Diagnosed with cataracts affecting vision. Scheduled for left eye cataract surgery on Monday, with right eye surgery two weeks later. Discussed procedure, including light sedation, intraoperative communication, and need for eye movement. Patient is comfortable with the plan. Informed consent obtained, including risks and benefits. - Proceed with cataract surgery on Monday - Schedule follow-up cataract surgery for the right eye two weeks later

## 2023-01-27 ENCOUNTER — Encounter: Payer: Self-pay | Admitting: Nurse Practitioner

## 2023-01-28 ENCOUNTER — Ambulatory Visit: Payer: Medicare Other | Admitting: Nurse Practitioner

## 2023-01-28 ENCOUNTER — Encounter: Payer: Self-pay | Admitting: Nurse Practitioner

## 2023-01-28 VITALS — BP 124/80 | HR 66 | Wt 220.4 lb

## 2023-01-28 DIAGNOSIS — N3090 Cystitis, unspecified without hematuria: Secondary | ICD-10-CM | POA: Diagnosis not present

## 2023-01-28 DIAGNOSIS — R309 Painful micturition, unspecified: Secondary | ICD-10-CM | POA: Diagnosis not present

## 2023-01-28 DIAGNOSIS — R3915 Urgency of urination: Secondary | ICD-10-CM | POA: Diagnosis not present

## 2023-01-28 LAB — POCT URINALYSIS DIP (CLINITEK)
Blood, UA: NEGATIVE
Glucose, UA: 250 mg/dL — AB
Nitrite, UA: POSITIVE — AB
Spec Grav, UA: 1.015 (ref 1.010–1.025)
Urobilinogen, UA: 4 U/dL — AB
pH, UA: 6 (ref 5.0–8.0)

## 2023-01-28 MED ORDER — AMOXICILLIN-POT CLAVULANATE 875-125 MG PO TABS: 1.0000 | ORAL_TABLET | Freq: Two times a day (BID) | ORAL | 0 refills | Status: DC

## 2023-01-28 MED ORDER — FLUCONAZOLE 150 MG PO TABS: 150.0000 mg | ORAL_TABLET | Freq: Once | ORAL | 2 refills | Status: AC

## 2023-01-28 MED ORDER — NITROFURANTOIN MONOHYD MACRO 100 MG PO CAPS: 100.0000 mg | ORAL_CAPSULE | Freq: Every day | ORAL | 6 refills | Status: DC

## 2023-01-28 NOTE — Patient Instructions (Addendum)
 I have sent in Augmentin  for you to take for the UTI. The culture shows that this will work. I sent in a 10 day supply for you so we can hopefully get this taken care of.   I sent in diflucan  in case you have yeast symptoms.   I also sent the referral to urogynecology.    I also sent in a daily prophylactic medication of Macrobid  to help prevent this from coming back.

## 2023-01-28 NOTE — Progress Notes (Signed)
  Camie FORBES Doing, DNP, AGNP-c [facility] [address] [place], [place] [address] [phone]   ACUTE VISIT- ESTABLISHED PATIENT  Blood pressure 124/80, pulse 66, weight 220 lb 6.4 oz (100 kg).  Subjective:  HPI Lauren Briggs is a 68 y.o. female presents to day for evaluation of acute concern(s).   Lauren Briggs reports return of urinary symptoms including dysuria, spasms, increase urgency, and suprapubic pressure that started in the last 24 hours. She has had similar symptoms with previous UTI's. She expresses concern over bladder prolapse as contributing to repeated infections.   ROS negative except for what is listed in HPI. History, Medications, Surgery, SDOH, and Family History reviewed and updated as appropriate.  Objective:  Physical Exam Vitals and nursing note reviewed.  Constitutional:      General: She is not in acute distress.    Appearance: Normal appearance.  HENT:     Head: Normocephalic.  Eyes:     Conjunctiva/sclera: Conjunctivae normal.  Cardiovascular:     Rate and Rhythm: Normal rate and regular rhythm.     Pulses: Normal pulses.     Heart sounds: Normal heart sounds.  Abdominal:     Tenderness: There is abdominal tenderness in the suprapubic area. There is no right CVA tenderness or left CVA tenderness.  Musculoskeletal:     Right lower leg: No edema.     Left lower leg: No edema.  Skin:    General: Skin is warm and dry.  Neurological:     Mental Status: She is alert and oriented to person, place, and time.          Assessment & Plan:   Problem List Items Addressed This Visit     Recurrent bacterial cystitis - Primary   UA positive for leukocytes with symptoms consistent with UTI. Vaginal atrophy and bladder prolapse likely contributing to the recurrence despite consistent care. 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 She is  in agreement.  - Start augmentin  for UTI - When augmentin  complete, start macrobid  daily for prevention - follow-up for repeat urinalysis if symptoms do not go away      Relevant Medications   amoxicillin -clavulanate (AUGMENTIN ) 875-125 MG tablet   nitrofurantoin , macrocrystal-monohydrate, (MACROBID ) 100 MG capsule   Other Relevant Orders   Ambulatory referral to Urogynecology   Other Visit Diagnoses       Urgency of urination       Relevant Orders   POCT URINALYSIS DIP (CLINITEK) (Completed)   Urine Culture (Completed)     Pain with urination       Relevant Orders   POCT URINALYSIS DIP (CLINITEK) (Completed)   Urine Culture (Completed)         Camie FORBES Doing, DNP, AGNP-c

## 2023-01-29 ENCOUNTER — Ambulatory Visit: Payer: Medicare Other

## 2023-01-29 NOTE — Progress Notes (Signed)
 Orthotics   Patient was present and evaluated for Custom molded foot orthotics. Patient will benefit from CFO's to provide total contact to BIL MLA's helping to balance and distribute body weight more evenly across BIL feet helping to reduce plantar pressure and pain. Orthotic will also encourage FF / RF alignment  Patient was scanned today and will return for fitting upon receipt

## 2023-01-30 LAB — URINE CULTURE

## 2023-02-05 ENCOUNTER — Encounter: Payer: Medicare Other | Admitting: Podiatry

## 2023-02-05 ENCOUNTER — Encounter: Payer: Self-pay | Admitting: Nurse Practitioner

## 2023-02-05 NOTE — Assessment & Plan Note (Signed)
 UA positive for leukocytes with symptoms consistent with UTI. Vaginal atrophy and bladder prolapse likely contributing to the recurrence despite consistent care. 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 She is in agreement.  - Start augmentin  for UTI - When augmentin  complete, start macrobid  daily for prevention - follow-up for repeat urinalysis if symptoms do not go away

## 2023-02-12 ENCOUNTER — Encounter: Payer: Medicare Other | Admitting: Podiatry

## 2023-02-22 DIAGNOSIS — M5124 Other intervertebral disc displacement, thoracic region: Secondary | ICD-10-CM | POA: Insufficient documentation

## 2023-02-24 ENCOUNTER — Encounter: Payer: Self-pay | Admitting: Podiatry

## 2023-02-24 ENCOUNTER — Ambulatory Visit (INDEPENDENT_AMBULATORY_CARE_PROVIDER_SITE_OTHER): Payer: Medicare Other | Admitting: Podiatry

## 2023-02-24 DIAGNOSIS — M7732 Calcaneal spur, left foot: Secondary | ICD-10-CM

## 2023-02-24 DIAGNOSIS — M722 Plantar fascial fibromatosis: Secondary | ICD-10-CM

## 2023-02-24 MED ORDER — METHYLPREDNISOLONE 4 MG PO TBPK: ORAL_TABLET | ORAL | 0 refills | Status: DC

## 2023-02-24 MED ORDER — MELOXICAM 15 MG PO TABS: 15.0000 mg | ORAL_TABLET | Freq: Every day | ORAL | 1 refills | Status: DC

## 2023-02-24 MED ORDER — BETAMETHASONE SOD PHOS & ACET 6 (3-3) MG/ML IJ SUSP
3.0000 mg | Freq: Once | INTRAMUSCULAR | Status: AC
  Administered 2023-02-24: 3 mg via INTRA_ARTICULAR

## 2023-02-24 NOTE — Progress Notes (Signed)
   Chief Complaint  Patient presents with   Foot Pain    Patient states her left heel is bugging her, patient had XR almost 6 months ago but would like to know if she needs another XR for left heel    Subjective: 68 y.o. female presenting today for evaluation of left heel pain ongoing gradually for about 6 months.  No history of injury.  She does not do anything for treatment.  She does have custom orthotics pending dispensable.   Past Medical History:  Diagnosis Date   Acute cystitis with hematuria 01/20/2022   Allergy    Arthritis    Back pain    Complication of anesthesia    HX Back CHIPPED TOOTH S/P EXTUBATION    Hyperlipidemia    Hypertension    Low back pain radiating to right leg    Mixed hypercholesterolemia and 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   Neuromuscular disorder (HCC)    Pre-diabetes    Prediabetes 11/10/2018   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  Dermatology: Skin is warm, dry and supple bilateral lower extremities. Negative for open lesions or macerations bilateral.   Vascular: Dorsalis Pedis and Posterior Tibial pulses palpable bilateral.  Capillary fill time is immediate to all digits.  Neurological: Grossly intact via light touch  Musculoskeletal: Tenderness to palpation to the plantar aspect of the left heel along the plantar fascia. All other joints range of motion within normal limits bilateral. Strength 5/5 in all groups bilateral.   Radiographic exam 09/11/2022: Orthopedic hardware noted with a compression staple to the second TMT and plate and screw fixation to the third TMT without complication.  Plantar heel spur noted with calcification along the plantar fascia  Assessment: 1. Plantar fasciitis left foot 2.  Plantar heel spur left  Plan of Care:  -Patient evaluated.  Prior  x-rays reviewed -Injection of 0.5 cc Celestone Soluspan injected in the plantar fascia left -Prescription for Medrol Dosepak -Prescription for meloxicam 15 mg daily after completion of the Dosepak -Plantar fascia brace dispensed.  Wear daily -Custom orthotics are pending dispensable. -Return to clinic as needed   Felecia Shelling, DPM [facility]  Dr. Felecia Shelling, DPM    [address] [place], [place] [address]                Office [phone]  Fax [phone]

## 2023-02-25 ENCOUNTER — Encounter: Payer: Self-pay | Admitting: Obstetrics and Gynecology

## 2023-02-25 ENCOUNTER — Ambulatory Visit (INDEPENDENT_AMBULATORY_CARE_PROVIDER_SITE_OTHER): Payer: Medicare Other | Admitting: Obstetrics and Gynecology

## 2023-02-25 VITALS — BP 117/55 | HR 63 | Ht 62.0 in | Wt 221.0 lb

## 2023-02-25 DIAGNOSIS — Z87440 Personal history of urinary (tract) infections: Secondary | ICD-10-CM | POA: Diagnosis not present

## 2023-02-25 DIAGNOSIS — R351 Nocturia: Secondary | ICD-10-CM | POA: Diagnosis not present

## 2023-02-25 DIAGNOSIS — R350 Frequency of micturition: Secondary | ICD-10-CM

## 2023-02-25 DIAGNOSIS — N390 Urinary tract infection, site not specified: Secondary | ICD-10-CM | POA: Insufficient documentation

## 2023-02-25 LAB — POCT URINALYSIS DIPSTICK
Bilirubin, UA: NEGATIVE
Blood, UA: NEGATIVE
Glucose, UA: NEGATIVE
Ketones, UA: NEGATIVE
Leukocytes, UA: NEGATIVE
Nitrite, UA: NEGATIVE
Protein, UA: NEGATIVE
Spec Grav, UA: 1.02 (ref 1.010–1.025)
Urobilinogen, UA: 0.2 U/dL
pH, UA: 6 (ref 5.0–8.0)

## 2023-02-25 NOTE — Assessment & Plan Note (Signed)
-   Advised to stop oxybutynin  due to its association with cognitive dysfunction and she has memory issues due to her stroke.  - Discussed starting another medication but she would like to stop for now and see what her symptoms are like. If she has increased nocturia, after stopping, can consider trospium or gemtesa. She is on a beta blocker so would not recommend Myrbetriq.

## 2023-02-25 NOTE — Patient Instructions (Addendum)
 For prevention of UTI - 1. Get supplement with D-mannose and cranberry and take daily - 2. Use vaginal estrogen cream twice a week - 3. Continue macrobid  once a day for 6 months  If you have symptoms of a UTI, call office to give a urine sample for culture.   Stop oxybutynin . If your nighttime symptoms return, then we can call in another medication.

## 2023-02-25 NOTE — Progress Notes (Signed)
 New Patient Evaluation and Consultation  Referring Provider: Early, Camie BRAVO, NP PCP: Oris Camie BRAVO, NP Date of Service: 02/25/2023  SUBJECTIVE Chief Complaint: New Patient (Initial Visit) Lauren Briggs is a 68 y.o. female here for a consult. Bacterial cystitis. )  History of Present Illness: Lauren Briggs is a 68 y.o. White or Caucasian female seen in consultation at the request of NP Early for evaluation of recurrent UTI.    Review of records significant for: Urine cultures:  01/28/23- >100,000 E. Coli (intermittent to levofloxacin) 01/10/23- 25- 50,000 E.Coli (intermittent to levofloxacin) 11/17/22- >100,000 E.Coli (pan sensitive)  Urinary Symptoms: Leaks urine with with a full bladder and when she has a UTI Does not leak every day. Usually only if she waits too long.  Pad use: 1 liners/ mini-pads per day.   Patient is bothered by UI symptoms.  Day time voids 3-5.  Nocturia: 1-2 times per night to void. Voiding dysfunction:  empties bladder well.  Patient does not use a catheter to empty bladder.  When urinating, patient feels dribbling after finishing Drinks: 1 cup coffee in AM, otherwise 1-4 bottles water per day, occasional tea or milk Has been on oxybutynin  15mg  XL for about over a year. Helps with her nighttime urination (used to wake 3-4 times per night). She complains of dry mouth symptoms.   UTIs: 7 UTI's in the last year.  When she has infections, she has discomfort, increased frequency and incontinence.  Denies history of blood in urine and kidney or bladder stones See above for recent cultures.  Started on macrobid  100mg  daily for about a a week.  She has a prescription for estrace  but has not used it in a few weeks. Previously was using about once a week.   Pelvic Organ Prolapse Symptoms:                  Patient Denies a feeling of a bulge the vaginal area.  Bowel Symptom: Bowel movements: 1 time(s) per day Stool consistency: soft  Straining: no.  Splinting:  no.  Incomplete evacuation: no.  Patient Denies accidental bowel leakage / fecal incontinence Bowel regimen: diet, stool softener, and suppository Pt reports that she was on zepbound  but caused too much constipation so stopped. No longer needs suppositories.   HM Colonoscopy          Upcoming     Colonoscopy (Every 10 Years) Next due on 02/10/2028    02/09/2018  COLONOSCOPY   Only the first 1 history entries have been loaded, but more history exists.                Sexual Function Sexually active: no.  Sexual orientation: Straight Pain with sex: Yes, at the vaginal opening, has discomfort due to dryness Husband is impotent but they tried recently and it was painful.   Pelvic Pain Denies pelvic pain  She has a history of lichen sclerosus and treats with emu oil soap. Also uses a steroid cream (betamethasone ) as needed.   Past Medical History:  Past Medical History:  Diagnosis Date   Acute cystitis with hematuria 01/20/2022   Allergy    Arthritis    Back pain    Complication of anesthesia    HX Back CHIPPED TOOTH S/P EXTUBATION    Hyperlipidemia    Hypertension    Ischemic stroke (HCC)    occured during back surgery   Low back pain radiating to right leg    Mixed hypercholesterolemia and 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  Pre-diabetes    Prediabetes 11/10/2018   Reactive depression (situational) 03/11/2022   Shortness of breath dyspnea    Urinary urgency 04/10/2022     Past Surgical History:   Past Surgical History:  Procedure Laterality Date   ANTERIOR LAT LUMBAR FUSION N/A 08/14/2018   Procedure: extreme lateral interbody fusion - Lumbar two-Lumbar three  cortical screws and removal pedicle screws Lumbar -Sacral one;  Surgeon: Onetha Kuba, MD;  Location[facility];  Service: Neurosurgery;  Laterality: N/A;   APPENDECTOMY     BACK SURGERY     times 6   CARPAL TUNNEL RELEASE     BIL    CESAREAN SECTION     PARTIAL HYST   CLOSED REDUCTION ULNAR SHAFT      COLONOSCOPY     FOOT SURGERY     LT FOOT CYST REMOVED    FOOT SURGERY Left 04/11/2022   HARDWARE REMOVAL N/A 08/14/2018   Procedure: Removal pedicle screws Lumbar -Sacral one with cortical screw fixation lumbar two-three;  Surgeon: Onetha Kuba, MD;  Location: [facility] OR;  Service: Neurosurgery;  Laterality: N/A;   NASAL SINUS SURGERY     1994    radial head Right 01/14/2016   at [facility]   RADIAL HEAD ARTHROPLASTY Right 01/26/2016   Procedure: RIGHT RADIAL HEAD REPLACEMENT ARTHROPLASTY WITH LIGAMENT REPAIRS;  Surgeon: Donnice Robinsons, MD;  Location: [facility];  Service: Orthopedics;  Laterality: Right;  Right elbow raial head replacement with possible ligament repairs as needed    SUPRACERVICAL ABDOMINAL HYSTERECTOMY     at time of c-section   UPPER GASTROINTESTINAL ENDOSCOPY       Past OB/GYN History: OB History  Gravida Para Term Preterm AB Living  2 2 2   2   SAB IAB Ectopic Multiple Live Births      2    # Outcome Date GA Lbr Len/2nd Weight Sex Type Anes PTL Lv  2 Term      CS-LTranv     1 Term      CS-LTranv      S/p supracervical hysterectomy Last pap smear was 10/2021- negative.    Medications: Patient has a current medication list which includes the following prescription(s): aspirin  ec, betamethasone  valerate ointment, coq-10, escitalopram , estradiol , ezetimibe , fenofibrate  micronized, lisinopril -hydrochlorothiazide , meloxicam , methylprednisolone , nitrofurantoin  (macrocrystal-monohydrate), oxybutynin , propranolol , simvastatin , zepbound , and trazodone .   Allergies: Patient is allergic to versed  [midazolam ], iodine, iodinated contrast media, lipitor [atorvastatin ], zocor  [simvastatin ], and propoxyphene hcl.   Social History:  Social History   Tobacco Use   Smoking status: Former    Current packs/day: 0.00    Types: Cigarettes    Start date: 1978    Quit date: 1981    Years since quitting: 44.1   Smokeless tobacco: Former    Quit date: 01/22/1979  Vaping  Use   Vaping status: Never Used  Substance Use Topics   Alcohol use: Yes    Comment: occasional bourbon and ginger ale    Drug use: No    Relationship status: married Patient lives with her husband.   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 Regular exercise: No History of abuse: No  Family History:   Family History  Problem Relation Age of Onset   Dementia Mother    Heart attack Mother    Hypertension Mother    Heart disease Mother    Hyperlipidemia Mother    Heart attack Father 70   Heart disease Father    Sudden death Father    Hyperlipidemia  Father    Heart attack Maternal Grandmother 21   Stroke Maternal Grandmother    Colon cancer Neg Hx    Colon polyps Neg Hx    Esophageal cancer Neg Hx    Stomach cancer Neg Hx    Rectal cancer Neg Hx      Review of Systems: Review of Systems  Constitutional:  Positive for weight loss. Negative for fever and malaise/fatigue.  Respiratory:  Negative for cough, shortness of breath and wheezing.   Cardiovascular:  Negative for chest pain, palpitations and leg swelling.  Gastrointestinal:  Negative for abdominal pain and blood in stool.  Genitourinary:  Negative for dysuria.  Musculoskeletal:  Negative for myalgias.  Skin:  Negative for rash.  Neurological:  Negative for dizziness and headaches.  Endo/Heme/Allergies:  Does not bruise/bleed easily.  Psychiatric/Behavioral:  Negative for depression. The patient is not nervous/anxious.      OBJECTIVE Physical Exam: Vitals:   02/25/23 1301  BP: (!) 117/55  Pulse: 63  Weight: 221 lb (100.2 kg)  Height: 5' 2 (1.575 m)    Physical Exam Vitals reviewed. Exam conducted with a chaperone present.  Constitutional:      General: She is not in acute distress. Pulmonary:     Effort: Pulmonary effort is normal.  Abdominal:     General: There is no distension.     Palpations: Abdomen is soft.     Tenderness: There is no abdominal tenderness. There is no rebound.   Musculoskeletal:        General: No swelling. Normal range of motion.  Skin:    General: Skin is warm and dry.     Findings: No rash.  Neurological:     Mental Status: She is alert and oriented to person, place, and time.  Psychiatric:        Mood and Affect: Mood normal.        Behavior: Behavior normal.      GU / Detailed Urogynecologic Evaluation:  Pelvic Exam: Normal external female genitalia; Bartholin's and Skene's glands normal in appearance; urethral meatus normal in appearance, no urethral masses or discharge.   CST: negative  Speculum exam reveals normal vaginal mucosa with atrophy. Cervix normal appearance and flat. Uterus absent. Adnexa  not palpable .    Pelvic floor strength II/V  Pelvic floor musculature: Right levator non-tender, Right obturator non-tender, Left levator non-tender, Left obturator non-tender  POP-Q:   POP-Q  -3                                            Aa   -3                                           Ba  -6                                              C   3  Gh  6                                            Pb  6                                            tvl   -1.5                                            Ap  -1.5                                            Bp  -5.5                                              D      Rectal Exam:  Normal rectum  Post-Void Residual (PVR) by Bladder Scan: In order to evaluate bladder emptying, we discussed obtaining a postvoid residual and patient agreed to this procedure.  Procedure: The ultrasound unit was placed on the patient's abdomen in the suprapubic region after the patient had voided.    Post Void Residual - 02/25/23 1319       Post Void Residual   Post Void Residual 43 mL              Laboratory Results: Lab Results  Component Value Date   COLORU Yellow 02/25/2023   CLARITYU Clear 02/25/2023   GLUCOSEUR Negative  02/25/2023   BILIRUBINUR Negative 02/25/2023   KETONESU Negative 02/25/2023   SPECGRAV 1.020 02/25/2023   RBCUR Negative 02/25/2023   PHUR 6.0 02/25/2023   PROTEINUR Negative 02/25/2023   UROBILINOGEN 0.2 02/25/2023   LEUKOCYTESUR Negative 02/25/2023    Lab Results  Component Value Date   CREATININE 0.87 11/14/2022   CREATININE 1.04 (H) 06/19/2022   CREATININE 1.82 (H) 05/30/2022    Lab Results  Component Value Date   HGBA1C 5.8 (H) 11/14/2022    Lab Results  Component Value Date   HGB 11.5 05/30/2022     ASSESSMENT AND PLAN Ms. Pounds is a 68 y.o. with:  1. Recurrent urinary tract infection   2. Nocturia   3. Urinary frequency     Recurrent urinary tract infection Assessment & Plan: - For treatment of recurrent urinary tract infections, we discussed management of recurrent UTIs including prophylaxis with a daily low dose antibiotic, transvaginal estrogen therapy, D-mannose, and cranberry supplements.  - She already has a prescription for estrace  so recommended she restart using twice a week consistently.  - Continue with macrobid  100mg  daily x 6 months - If she has UTI symptoms, she should obtain urine sample for culture at our office or PCP (or urgent care if the weekend) in order to track the bacteria.    Nocturia Assessment & Plan: - Advised to stop oxybutynin  due to its association with cognitive dysfunction and she has memory issues due to her  stroke.  - Discussed starting another medication but she would like to stop for now and see what her symptoms are like. If she has increased nocturia, after stopping, can consider trospium or gemtesa. She is on a beta blocker so would not recommend Myrbetriq.    Urinary frequency -     POCT urinalysis dipstick  Return 6 months or sooner if needed  Lauren LOISE Caper, MD

## 2023-02-25 NOTE — Assessment & Plan Note (Signed)
>>  ASSESSMENT AND PLAN FOR RECURRENT URINARY TRACT INFECTION WRITTEN ON 02/25/2023  3:18 PM BY SCHROEDER, Ezzie Holstein, MD  - For treatment of recurrent urinary tract infections, we discussed management of recurrent UTIs including prophylaxis with a daily low dose antibiotic, transvaginal estrogen therapy, D-mannose, and cranberry supplements.  - She already has a prescription for estrace  so recommended she restart using twice a week consistently.  - Continue with macrobid  100mg  daily x 6 months - If she has UTI symptoms, she should obtain urine sample for culture at our office or PCP (or urgent care if the weekend) in order to track the bacteria.

## 2023-02-25 NOTE — Assessment & Plan Note (Addendum)
-   For treatment of recurrent urinary tract infections, we discussed management of recurrent UTIs including prophylaxis with a daily low dose antibiotic, transvaginal estrogen therapy, D-mannose, and cranberry supplements.  - She already has a prescription for estrace  so recommended she restart using twice a week consistently.  - Continue with macrobid  100mg  daily x 6 months - If she has UTI symptoms, she should obtain urine sample for culture at our office or PCP (or urgent care if the weekend) in order to track the bacteria.

## 2023-02-26 ENCOUNTER — Encounter: Payer: Medicare Other | Admitting: Podiatry

## 2023-03-05 ENCOUNTER — Ambulatory Visit: Payer: Medicare Other | Admitting: Podiatry

## 2023-03-10 ENCOUNTER — Encounter: Payer: Self-pay | Admitting: Nurse Practitioner

## 2023-03-12 ENCOUNTER — Ambulatory Visit (INDEPENDENT_AMBULATORY_CARE_PROVIDER_SITE_OTHER): Payer: Medicare Other

## 2023-03-12 DIAGNOSIS — M2142 Flat foot [pes planus] (acquired), left foot: Secondary | ICD-10-CM | POA: Diagnosis not present

## 2023-03-12 DIAGNOSIS — M2141 Flat foot [pes planus] (acquired), right foot: Secondary | ICD-10-CM

## 2023-03-12 DIAGNOSIS — M19071 Primary osteoarthritis, right ankle and foot: Secondary | ICD-10-CM | POA: Diagnosis not present

## 2023-03-12 DIAGNOSIS — M722 Plantar fascial fibromatosis: Secondary | ICD-10-CM

## 2023-03-12 NOTE — Progress Notes (Signed)
 Patient presents today to pick up custom molded foot orthotics, diagnosed with PF by Dr. Logan Bores.   Orthotics were dispensed and fit was satisfactory. Reviewed instructions for break-in and wear. Written instructions given to patient.  Patient will follow up as needed.  Addison Bailey

## 2023-03-22 DEATH — deceased

## 2023-04-28 ENCOUNTER — Ambulatory Visit: Payer: Medicare Other | Admitting: Obstetrics and Gynecology

## 2023-04-28 ENCOUNTER — Ambulatory Visit (INDEPENDENT_AMBULATORY_CARE_PROVIDER_SITE_OTHER): Admitting: Podiatry

## 2023-04-28 DIAGNOSIS — M722 Plantar fascial fibromatosis: Secondary | ICD-10-CM

## 2023-04-28 DIAGNOSIS — M19071 Primary osteoarthritis, right ankle and foot: Secondary | ICD-10-CM | POA: Diagnosis not present

## 2023-04-28 MED ORDER — BETAMETHASONE SOD PHOS & ACET 6 (3-3) MG/ML IJ SUSP
3.0000 mg | Freq: Once | INTRAMUSCULAR | Status: AC
  Administered 2023-04-28: 3 mg via INTRA_ARTICULAR

## 2023-04-28 MED ORDER — MELOXICAM 15 MG PO TABS: 15.0000 mg | ORAL_TABLET | Freq: Every day | ORAL | 1 refills | Status: DC

## 2023-04-28 NOTE — Progress Notes (Signed)
   Chief Complaint  Patient presents with   Foot Pain    Patient is here for right foot pain HX of bilateral PF    Subjective: 68 y.o. 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  Dermatology: Skin is warm, dry and supple bilateral lower extremities. Negative for open lesions or macerations bilateral.   Vascular: Dorsalis Pedis and Posterior Tibial pulses palpable bilateral.  Capillary fill time is immediate to all digits.  Neurological: Grossly intact via light touch  Musculoskeletal: Tenderness to palpation to the plantar aspect of the right heel along the plantar fascia. All other joints range of motion within normal limits bilateral. Strength 5/5 in all groups bilateral.    Assessment: 1. Plantar fasciitis right 2. H/o third TMT revisional arthrodesis left with harvesting of bone autograft left calcaneus. DOS: 3/21/202; stable with routine healing 3.  RT midfoot DJD/arthritis   Plan of Care:  -Patient evaluated -Injection of 0.5 cc Celestone Soluspan injected in the plantar fascia right -Refill prescription for meloxicam 15 mg daily as needed -Patient currently has a pair of orthotics but they were too bulky in her shoes.  She will set up an appointment with our orthotics department for orthotics modification -Return to clinic with me as needed  Felecia Shelling, DPM [facility]  Dr. Felecia Shelling, DPM    [address] [place], [place] [address]                Office [phone]  Fax [phone]

## 2023-05-02 ENCOUNTER — Encounter: Payer: Self-pay | Admitting: Nurse Practitioner

## 2023-05-02 DIAGNOSIS — J0120 Acute ethmoidal sinusitis, unspecified: Secondary | ICD-10-CM

## 2023-05-02 MED ORDER — FLUCONAZOLE 150 MG PO TABS: ORAL_TABLET | ORAL | 2 refills | Status: AC

## 2023-05-02 MED ORDER — AMOXICILLIN-POT CLAVULANATE 875-125 MG PO TABS: 1.0000 | ORAL_TABLET | Freq: Two times a day (BID) | ORAL | 0 refills | Status: DC

## 2023-05-19 ENCOUNTER — Ambulatory Visit: Admitting: Podiatry

## 2023-05-20 ENCOUNTER — Ambulatory Visit: Payer: Medicare Other

## 2023-05-20 DIAGNOSIS — Z0000 Encounter for general adult medical examination without abnormal findings: Secondary | ICD-10-CM

## 2023-05-20 NOTE — Patient Instructions (Signed)
 Lauren Briggs , Thank you for taking time to come for your Medicare Wellness Visit. I appreciate your ongoing commitment to your health goals. Please review the following plan we discussed and let me know if I can assist you in the future.   Referrals/Orders/Follow-Ups/Clinician Recommendations: none  This is a list of the screening recommended for you and due dates:  Health Maintenance  Topic Date Due   Pneumonia Vaccine (1 of 1 - PCV) Never done   DEXA scan (bone density measurement)  Never done   COVID-19 Vaccine (5 - 2024-25 season) 09/22/2022   Mammogram  11/27/2023   Medicare Annual Wellness Visit  05/19/2024   DTaP/Tdap/Td vaccine (2 - Td or Tdap) 08/31/2024   Colon Cancer Screening  02/10/2028   HPV Vaccine  Aged Out   Meningitis B Vaccine  Aged Out   Flu Shot  Discontinued   Hepatitis C Screening  Discontinued   Zoster (Shingles) Vaccine  Discontinued    Advanced directives: (Copy Requested) Please bring a copy of your health care power of attorney and living will to the office to be added to your chart at your convenience. You can mail to [facility] [address] [place], [place] [address] or email to [email or web address]  Next Medicare Annual Wellness Visit scheduled for next year: Yes  insert Preventive Care attachment Insert FALL PREVENTION attachment if needed

## 2023-05-20 NOTE — Progress Notes (Signed)
 Subjective:   Lauren Briggs is a 68 y.o. who presents for a Medicare Wellness preventive visit.  Visit Complete: Virtual I connected with  Matthew Songster on 05/20/23 by a audio enabled telemedicine application and verified that I am speaking with the correct person using two identifiers.  Patient Location: Home  Provider Location: Office/Clinic  I discussed the limitations of evaluation and management by telemedicine. The patient expressed understanding and agreed to proceed.  Vital Signs: Because this visit was a virtual/telehealth visit, some criteria may be missing or patient reported. Any vitals not documented were not able to be obtained and vitals that have been documented are patient reported.  VideoError- Librarian, academic were attempted between this provider and patient, however failed, due to patient having technical difficulties OR patient did not have access to video capability.  We continued and completed visit with audio only.   Persons Participating in Visit: Patient.  AWV Questionnaire: Yes: Patient Medicare AWV questionnaire was completed by the patient on 05/18/2023; I have confirmed that all information answered by patient is correct and no changes since this date.  Cardiac Risk Factors include: advanced age (>53men, >72 women);hypertension     Objective:    Today's Vitals   05/20/23 1046  PainSc: 1    There is no height or weight on file to calculate BMI.     05/20/2023   11:02 AM 05/14/2022   10:26 AM 12/23/2019    8:00 AM 12/20/2019    9:29 AM 08/14/2018    6:22 AM 08/10/2018    1:13 PM 01/26/2016   11:58 AM  Advanced Directives  Does Patient Have a Medical Advance Directive? Yes Yes Yes Yes Yes Yes Yes  Type of Estate agent of [place];Living will Healthcare Power of [place];Living will Healthcare Power of [place];Living will Healthcare Power of [place];Living will Healthcare Power of [employer]  Power of [employer] Power of Attorney  Does patient want to make changes to medical advance directive?   No - Patient declined No - Patient declined No - Patient declined No - Patient declined No - Patient declined  Copy of Healthcare Power of Attorney in Chart? No - copy requested No - copy requested Yes - validated most recent copy scanned in chart (See row information) Yes - validated most recent copy scanned in chart (See row information) No - copy requested No - copy requested No - copy requested  Would patient like information on creating a medical advance directive?     No - Patient declined No - Patient declined     Current Medications (verified) Outpatient Encounter Medications as of 05/20/2023  Medication Sig   aspirin  EC 81 MG tablet Take 1 tablet (81 mg total) by mouth daily. Swallow whole.   betamethasone  valerate ointment (VALISONE ) 0.1 %    Coenzyme Q10 (COQ-10) 100 MG CAPS Take 100 mg by mouth in the morning and at bedtime. otc   escitalopram  (LEXAPRO ) 10 MG tablet Take 1 tablet (10 mg total) by mouth at bedtime.   estradiol  (ESTRACE ) 1 MG tablet Take 1 mg by mouth as directed. 1 tab every other day   ezetimibe  (ZETIA ) 10 MG tablet Take 1 tablet (10 mg total) by mouth daily.   fenofibrate  micronized (LOFIBRA) 134 MG capsule TAKE 1 CAPSULE(134 MG) BY MOUTH DAILY BEFORE BREAKFAST   fluconazole  (DIFLUCAN ) 150 MG tablet Take 1 tablet at the first sign of yeast infection and 1 tablet three days later.  lisinopril -hydrochlorothiazide  (ZESTORETIC ) 20-12.5 MG tablet Take 1 tablet by mouth daily.   meloxicam  (MOBIC ) 15 MG tablet Take 1 tablet (15 mg total) by mouth daily.   nitrofurantoin , macrocrystal-monohydrate, (MACROBID ) 100 MG capsule Take 1 capsule (100 mg total) by mouth at bedtime. Start after completing full dose of Augmentin .   propranolol  (INDERAL ) 20 MG tablet Take 1 tablet (20 mg total) by mouth 3 (three) times daily as needed.   simvastatin  (ZOCOR ) 20 MG tablet  Take 1 tablet (20 mg total) by mouth daily at 6 PM.   traZODone  (DESYREL ) 50 MG tablet Take 1 tablet (50 mg total) by mouth at bedtime.   amoxicillin -clavulanate (AUGMENTIN ) 875-125 MG tablet Take 1 tablet by mouth 2 (two) times daily.   methylPREDNISolone  (MEDROL  DOSEPAK) 4 MG TBPK tablet 6 day dose pack - take as directed   oxybutynin  (DITROPAN  XL) 15 MG 24 hr tablet Take 1 tablet (15 mg total) by mouth at bedtime.   tirzepatide (ZEPBOUND) 7.5 MG/0.5ML Pen Inject 7.5 mg into the skin once a week.   No facility-administered encounter medications on file as of 05/20/2023.    Allergies (verified) Versed  [midazolam ], Iodine, Iodinated contrast media, Lipitor [atorvastatin ], Zocor  [simvastatin ], and Propoxyphene hcl   History: Past Medical History:  Diagnosis Date   Acute cystitis with hematuria 01/20/2022   Allergy    Anxiety 01-15-21   Arthritis    Back pain    Complication of anesthesia    HX Back CHIPPED TOOTH S/P EXTUBATION    Hyperlipidemia    Hypertension    Ischemic stroke (HCC)    occured during back surgery   Low back pain radiating to right leg    Mixed hypercholesterolemia and 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   Pre-diabetes    Prediabetes 11/10/2018   Reactive depression (situational) 03/11/2022   Shortness of breath dyspnea    Urinary urgency 04/10/2022   Past Surgical History:  Procedure Laterality Date   ABDOMINAL HYSTERECTOMY  1985   ANTERIOR LAT LUMBAR FUSION N/A 08/14/2018   Procedure: extreme lateral interbody fusion - Lumbar two-Lumbar three  cortical screws and removal pedicle screws Lumbar -Sacral one;  Surgeon: Gearl Keens, MD;  Location: [facility] OR;  Service: Neurosurgery;  Laterality: N/A;   APPENDECTOMY     BACK SURGERY     times 6   CARPAL TUNNEL RELEASE     BIL    CESAREAN SECTION     PARTIAL HYST   CLOSED REDUCTION ULNAR SHAFT     COLONOSCOPY     FOOT SURGERY     LT FOOT CYST REMOVED    FOOT SURGERY Left 04/11/2022   HARDWARE REMOVAL N/A  08/14/2018   Procedure: Removal pedicle screws Lumbar -Sacral one with cortical screw fixation lumbar two-three;  Surgeon: Gearl Keens, MD;  Location: [facility] OR;  Service: Neurosurgery;  Laterality: N/A;   JOINT REPLACEMENT  broken elbow -   replace radial head of R elbow   NASAL SINUS SURGERY     1994    radial head Right 01/14/2016   at [facility]   RADIAL HEAD ARTHROPLASTY Right 01/26/2016   Procedure: RIGHT RADIAL HEAD REPLACEMENT ARTHROPLASTY WITH LIGAMENT REPAIRS;  Surgeon: [place] Hurter, MD;  Location: [facility];  Service: Orthopedics;  Laterality: Right;  Right elbow raial head replacement with possible ligament repairs as needed    SPINE SURGERY  6 spine surgeries   Fusion L1-S1   SUPRACERVICAL ABDOMINAL HYSTERECTOMY     at time of c-section   UPPER GASTROINTESTINAL ENDOSCOPY  Family History  Problem Relation Age of Onset   Dementia Mother    Heart attack Mother    Hypertension Mother    Heart disease Mother    Hyperlipidemia Mother    Heart attack Father 61   Heart disease Father    Sudden death Father    Hyperlipidemia Father    Heart attack Maternal Grandmother 46   Stroke Maternal Grandmother    Colon cancer Neg Hx    Colon polyps Neg Hx    Esophageal cancer Neg Hx    Stomach cancer Neg Hx    Rectal cancer Neg Hx    Social History   Socioeconomic History   Marital status: Married    Spouse name: keith   Number of children: Not on file   Years of education: Not on file   Highest education level: Associate degree: occupational, Scientist, product/process development, or vocational program  Occupational History   Occupation: retired IT consultant  Tobacco Use   Smoking status: Former    Current packs/day: 0.00    Types: Cigarettes    Start date: 1978    Quit date: 1981    Years since quitting: 44.3   Smokeless tobacco: Former    Quit date: 01/22/1979  Vaping Use   Vaping status: Never Used  Substance and Sexual Activity   Alcohol use: Yes    Comment: occasional bourbon  and ginger ale    Drug use: No   Sexual activity: Not Currently  Other Topics Concern   Not on file  Social History Narrative   Not on file   Social Drivers of Health   Financial Resource Strain: Low Risk  (05/18/2023)   Overall Financial Resource Strain (CARDIA)    Difficulty of Paying Living Expenses: Not hard at all  Food Insecurity: No Food Insecurity (05/18/2023)   Hunger Vital Sign    Worried About Running Out of Food in the Last Year: Never true    Ran Out of Food in the Last Year: Never true  Transportation Needs: No Transportation Needs (05/18/2023)   PRAPARE - Administrator, Civil Service (Medical): No    Lack of Transportation (Non-Medical): No  Physical Activity: Unknown (05/18/2023)   Exercise Vital Sign    Days of Exercise per Week: 0 days    Minutes of Exercise per Session: Not on file  Stress: No Stress Concern Present (05/18/2023)   [employer] of Occupational Health - Occupational Stress Questionnaire    Feeling of Stress : Only a little  Social Connections: Moderately Isolated (05/18/2023)   Social Connection and Isolation Panel [NHANES]    Frequency of Communication with Friends and Family: More than three times a week    Frequency of Social Gatherings with Friends and Family: Once a week    Attends Religious Services: Never    Database administrator or Organizations: No    Attends Engineer, structural: Not on file    Marital Status: Married    Tobacco Counseling Counseling given: Not Answered    Clinical Intake:  Pre-visit preparation completed: Yes  Pain : 0-10 Pain Score: 1  Pain Type: Chronic pain Pain Location: Back Pain Descriptors / Indicators: Aching Pain Onset: More than a month ago Pain Frequency: Constant     Nutritional Risks: Nausea/ vomitting/ diarrhea (diarrhea and vomiting this weekend, resolved) Diabetes: No  Lab Results  Component Value Date   HGBA1C 5.8 (H) 11/14/2022   HGBA1C 5.6 05/30/2022    HGBA1C 6.0 (H) 12/04/2021  How often do you need to have someone help you when you read instructions, pamphlets, or other written materials from your doctor or pharmacy?: 1 - Never  Interpreter Needed?: No  Information entered by :: NAllen LPN   Activities of Daily Living     05/18/2023    8:57 AM  In your present state of health, do you have any difficulty performing the following activities:  Hearing? 0  Vision? 0  Difficulty concentrating or making decisions? 0  Walking or climbing stairs? 0  Dressing or bathing? 0  Doing errands, shopping? 0  Preparing Food and eating ? N  Using the Toilet? N  In the past six months, have you accidently leaked urine? Y  Do you have problems with loss of bowel control? N  Managing your Medications? N  Managing your Finances? N  Housekeeping or managing your Housekeeping? N    Patient Care Team: Early, Adriane Albe, NP as PCP - General (Nurse Practitioner) Gearl Keens, MD as Consulting Physician (Neurosurgery)  Indicate any recent Medical Services you may have received from other than [facility] providers in the past year (date may be approximate).     Assessment:   This is a routine wellness examination for Lauren Briggs.  Hearing/Vision screen Hearing Screening - Comments:: Denies hearing issues Vision Screening - Comments:: Regular eye exams, [facility]   Goals Addressed             This Visit's Progress    Patient Stated       05/20/2023, wants to lose weight       Depression Screen     05/20/2023   11:05 AM 11/14/2022   11:13 AM 05/14/2022   10:28 AM 12/04/2021    1:53 PM 08/03/2021    9:15 AM 05/04/2021    9:07 AM 03/25/2021    9:45 PM  PHQ 2/9 Scores  PHQ - 2 Score 0 0 0 0 0 0 4  PHQ- 9 Score 1 0 0 1 0  16  Exception Documentation      Medical reason     Fall Risk     05/18/2023    8:57 AM 11/14/2022   11:12 AM 05/11/2022    9:57 AM 12/04/2021    1:56 PM 08/03/2021    9:15 AM  Fall Risk   Falls in the past year? 0 0 0  0 0  Number falls in past yr: 0 0   0  Injury with Fall? 0 0 0  0  Risk for fall due to : Medication side effect No Fall Risks Medication side effect;Impaired mobility;Impaired balance/gait  No Fall Risks  Follow up Falls prevention discussed;Falls evaluation completed Falls evaluation completed Falls prevention discussed;Education provided;Falls evaluation completed  Falls evaluation completed    MEDICARE RISK AT HOME:  Medicare Risk at Home Any stairs in or around the home?: (Patient-Rptd) Yes If so, are there any without handrails?: (Patient-Rptd) No Home free of loose throw rugs in walkways, pet beds, electrical cords, etc?: (Patient-Rptd) Yes Adequate lighting in your home to reduce risk of falls?: (Patient-Rptd) Yes Life alert?: (Patient-Rptd) No Use of a cane, walker or w/c?: (Patient-Rptd) No Grab bars in the bathroom?: (Patient-Rptd) Yes Shower chair or bench in shower?: (Patient-Rptd) Yes Elevated toilet seat or a handicapped toilet?: (Patient-Rptd) No  TIMED UP AND GO:  Was the test performed?  No  Cognitive Function: 6CIT completed    06/07/2021    1:21 PM 01/03/2021    8:51 AM  MMSE -  Mini Mental State Exam  Orientation to time 5 5  Orientation to Place 5 5  Registration 3 3  Attention/ Calculation 4 5  Recall 2 1  Language- name 2 objects 2 2  Language- repeat 1 1  Language- follow 3 step command 3 3  Language- read & follow direction 1 1  Write a sentence 1 1  Copy design 1 1  Total score 28 28        05/20/2023   11:06 AM 05/14/2022   10:30 AM  6CIT Screen  What Year? 0 points 0 points  What month? 0 points 0 points  What time? 0 points 0 points  Count back from 20 0 points 0 points  Months in reverse 0 points 0 points  Repeat phrase 0 points 4 points  Total Score 0 points 4 points    Immunizations Immunization History  Administered Date(s) Administered   PFIZER(Purple Top)SARS-COV-2 Vaccination 03/25/2019, 04/24/2019, 11/17/2019, 11/20/2020    Tdap 09/01/2014    Screening Tests Health Maintenance  Topic Date Due   Pneumonia Vaccine 46+ Years old (1 of 1 - PCV) Never done   DEXA SCAN  Never done   COVID-19 Vaccine (5 - 2024-25 season) 09/22/2022   MAMMOGRAM  11/27/2023   Medicare Annual Wellness (AWV)  05/19/2024   DTaP/Tdap/Td (2 - Td or Tdap) 08/31/2024   Colonoscopy  02/10/2028   HPV VACCINES  Aged Out   Meningococcal B Vaccine  Aged Out   INFLUENZA VACCINE  Discontinued   Hepatitis C Screening  Discontinued   Zoster Vaccines- Shingrix  Discontinued    Health Maintenance  Health Maintenance Due  Topic Date Due   Pneumonia Vaccine 23+ Years old (1 of 1 - PCV) Never done   DEXA SCAN  Never done   COVID-19 Vaccine (5 - 2024-25 season) 09/22/2022   Health Maintenance Items Addressed: Due for pneumonia vaccine and declines covid.  Additional Screening:  Vision Screening: Recommended annual ophthalmology exams for early detection of glaucoma and other disorders of the eye.  Dental Screening: Recommended annual dental exams for proper oral hygiene  Community Resource Referral / Chronic Care Management: CRR required this visit?  No   CCM required this visit?  No     Plan:     I have personally reviewed and noted the following in the patient's chart:   Medical and social history Use of alcohol, tobacco or illicit drugs  Current medications and supplements including opioid prescriptions. Patient is not currently taking opioid prescriptions. Functional ability and status Nutritional status Physical activity Advanced directives List of other physicians Hospitalizations, surgeries, and ER visits in previous 12 months Vitals Screenings to include cognitive, depression, and falls Referrals and appointments  In addition, I have reviewed and discussed with patient certain preventive protocols, quality metrics, and best practice recommendations. A written personalized care plan for preventive services as well  as general preventive health recommendations were provided to patient.     Areatha Beecham, LPN   1/61/0960   After Visit Summary: (MyChart) Due to this being a telephonic visit, the after visit summary with patients personalized plan was offered to patient via MyChart   Notes: Nothing significant to report at this time.

## 2023-05-22 ENCOUNTER — Encounter: Payer: Self-pay | Admitting: Nurse Practitioner

## 2023-05-22 ENCOUNTER — Ambulatory Visit: Payer: Medicare Other | Admitting: Nurse Practitioner

## 2023-05-22 VITALS — BP 126/82 | HR 78 | Ht 63.5 in | Wt 231.4 lb

## 2023-05-22 DIAGNOSIS — Z6841 Body Mass Index (BMI) 40.0 and over, adult: Secondary | ICD-10-CM | POA: Diagnosis not present

## 2023-05-22 DIAGNOSIS — E782 Mixed hyperlipidemia: Secondary | ICD-10-CM | POA: Diagnosis not present

## 2023-05-22 DIAGNOSIS — I10 Essential (primary) hypertension: Secondary | ICD-10-CM | POA: Diagnosis not present

## 2023-05-22 DIAGNOSIS — R7303 Prediabetes: Secondary | ICD-10-CM | POA: Diagnosis not present

## 2023-05-22 DIAGNOSIS — F418 Other specified anxiety disorders: Secondary | ICD-10-CM

## 2023-05-22 DIAGNOSIS — K760 Fatty (change of) liver, not elsewhere classified: Secondary | ICD-10-CM

## 2023-05-22 DIAGNOSIS — F4323 Adjustment disorder with mixed anxiety and depressed mood: Secondary | ICD-10-CM

## 2023-05-22 LAB — LIPID PANEL

## 2023-05-22 MED ORDER — PROPRANOLOL HCL 20 MG PO TABS: 20.0000 mg | ORAL_TABLET | Freq: Three times a day (TID) | ORAL | 3 refills | Status: AC | PRN

## 2023-05-22 MED ORDER — FENOFIBRATE 134 MG PO CAPS: 134.0000 mg | ORAL_CAPSULE | Freq: Every day | ORAL | 3 refills | Status: AC

## 2023-05-22 NOTE — Patient Instructions (Addendum)
 I recommend getting the bone density scan when you get your next mammogram.   If you need any refills, please let me know. I sent refills of the propranolol  and fenofibrate  because they looked like they are due for refills. If you don't need these you can tell the pharmacy to hold them.

## 2023-05-22 NOTE — Progress Notes (Unsigned)
 Dell Fennel, DNP, AGNP-c [facility]  [address] [place], [place] [address] [phone]  ESTABLISHED PATIENT- Chronic Health and/or Follow-Up Visit  Blood pressure 126/82, pulse 78, height 5' 3.5" (1.613 m), weight 231 lb 6.4 oz (105 kg).    Lauren Briggs is a 68 y.o. year old female presenting today for evaluation and management of chronic conditions.   History of Present Illness Lauren Briggs is a 68 year old female who presents with a history of foot injury and elbow surgeries.   She has a history of spinal issues, specifically a solid fusion from L1 to S1. She recalls a significant episode of pain when L5-S1 gave way, describing it as extremely severe.  She discontinued Zepbound due to severe constipation, stating that the side effects were intolerable. She reports weight gain and a recent ice cream binge, which she has since stopped. She is working to get back on track with this and continues to see weight management. She is currently taking Zetia  for cholesterol and an 81 mg aspirin  daily. She was previously on propranolol  for anxiety related to family stress but is considering discontinuing it.  She has a history of cataract surgery, which she describes as a positive experience with improved vision post-surgery.  Her father died suddenly at age 54, possibly from an aortic rupture. She has a strained relationship with her son and daughter-in-law, which has been a source of emotional distress.  No chest pain, shortness of breath, or dizziness.  All ROS negative with exception of what is listed above.   PHYSICAL EXAM Physical Exam Vitals and nursing note reviewed.  Constitutional:      Appearance: Normal appearance. She is obese.  HENT:     Head: Normocephalic.  Eyes:     Conjunctiva/sclera: Conjunctivae normal.  Neck:     Vascular: No carotid bruit.  Cardiovascular:     Rate and Rhythm: Normal rate and regular rhythm.     Pulses: Normal pulses.      Heart sounds: Normal heart sounds.  Pulmonary:     Effort: Pulmonary effort is normal.     Breath sounds: Normal breath sounds.  Abdominal:     General: Bowel sounds are normal.     Palpations: Abdomen is soft.  Musculoskeletal:     Cervical back: Normal range of motion and neck supple.     Right lower leg: No edema.     Left lower leg: No edema.  Lymphadenopathy:     Cervical: No cervical adenopathy.  Skin:    General: Skin is warm and dry.     Capillary Refill: Capillary refill takes less than 2 seconds.  Neurological:     Mental Status: She is alert and oriented to person, place, and time.  Psychiatric:        Mood and Affect: Mood normal.        Behavior: Behavior normal.      PLAN Problem List Items Addressed This Visit     Mixed hypercholesterolemia and hypertriglyceridemia   Currently managed with zetia  and simvastatin . No alarm concerns at this time. Recommend diet and exercise monitoring. Exercise has been limited by foot surgery, but this is well healed. Walking 15-4m a day would be very beneficial.       Relevant Medications   fenofibrate  micronized (LOFIBRA) 134 MG capsule   propranolol  (INDERAL ) 20 MG tablet   Other Relevant Orders   CBC with Differential/Platelet (Completed)   CMP14+EGFR (Completed)   Hemoglobin A1c (Completed)  Lipid panel (Completed)   Essential hypertension - Primary   BP stable today. Continue lisinopril -hydrochlorothiazide  and propranolol  for management.       Relevant Medications   fenofibrate  micronized (LOFIBRA) 134 MG capsule   propranolol  (INDERAL ) 20 MG tablet   Other Relevant Orders   CBC with Differential/Platelet (Completed)   CMP14+EGFR (Completed)   Hemoglobin A1c (Completed)   Lipid panel (Completed)   Prediabetes   Recently stopped tirzepatide for weight management and blood sugar management. Significant increase in appetite resulted, however, the side effects of the medication were too bothersome. Recommend  close monitoring of diet with healthy options and daily walking 15-20 minutes to help build up stamina.       Relevant Orders   CBC with Differential/Platelet (Completed)   CMP14+EGFR (Completed)   Hemoglobin A1c (Completed)   Lipid panel (Completed)   Body mass index (BMI) 40.0-44.9, adult (HCC)   Currently working with weight management. Suggest close monitoring of diet, but avoidance of complete elimination of any specific food type to help maintain overall health. Now that foot is better, recommend starting walking about 15-20 minutes a day to increase strength and add time and distance as tolerated.       Relevant Orders   CBC with Differential/Platelet (Completed)   CMP14+EGFR (Completed)   Hemoglobin A1c (Completed)   Lipid panel (Completed)   Adjustment disorder with mixed anxiety and depressed mood   Ongoing depression exacerbated by family estrangement. Engaged in regular counseling and advised to write letters for emotional distress.      Fatty liver   Diet and exercise management recommended. Unable to tolerate tirzepatide any longer due to side effects. Will work on increased activity.       Relevant Orders   CBC with Differential/Platelet (Completed)   CMP14+EGFR (Completed)   Hemoglobin A1c (Completed)   Lipid panel (Completed)   Other Visit Diagnoses       Situational anxiety       Relevant Medications   propranolol  (INDERAL ) 20 MG tablet       Return in about 6 months (around 11/22/2023) for CPE. Time: 46 minutes, >50% spent counseling, care coordination, chart review, and documentation.   Dell Fennel, DNP, AGNP-c

## 2023-05-23 LAB — CMP14+EGFR
ALT: 17 IU/L (ref 0–32)
AST: 17 IU/L (ref 0–40)
Albumin: 4.3 g/dL (ref 3.9–4.9)
Alkaline Phosphatase: 57 IU/L (ref 44–121)
BUN/Creatinine Ratio: 40 — ABNORMAL HIGH (ref 12–28)
BUN: 38 mg/dL — ABNORMAL HIGH (ref 8–27)
Bilirubin Total: 0.4 mg/dL (ref 0.0–1.2)
CO2: 23 mmol/L (ref 20–29)
Calcium: 9.8 mg/dL (ref 8.7–10.3)
Chloride: 103 mmol/L (ref 96–106)
Creatinine, Ser: 0.96 mg/dL (ref 0.57–1.00)
Globulin, Total: 2.1 g/dL (ref 1.5–4.5)
Glucose: 106 mg/dL — ABNORMAL HIGH (ref 70–99)
Potassium: 4.9 mmol/L (ref 3.5–5.2)
Sodium: 138 mmol/L (ref 134–144)
Total Protein: 6.4 g/dL (ref 6.0–8.5)
eGFR: 65 mL/min/{1.73_m2} (ref >59)

## 2023-05-23 LAB — CBC WITH DIFFERENTIAL/PLATELET
Basophils Absolute: 0.1 10*3/uL (ref 0.0–0.2)
Basos: 1 %
EOS (ABSOLUTE): 0.3 10*3/uL (ref 0.0–0.4)
Eos: 5 %
Hematocrit: 37.1 % (ref 34.0–46.6)
Hemoglobin: 12.1 g/dL (ref 11.1–15.9)
Immature Grans (Abs): 0 10*3/uL (ref 0.0–0.1)
Immature Granulocytes: 0 %
Lymphocytes Absolute: 2.5 10*3/uL (ref 0.7–3.1)
Lymphs: 44 %
MCH: 29.6 pg (ref 26.6–33.0)
MCHC: 32.6 g/dL (ref 31.5–35.7)
MCV: 91 fL (ref 79–97)
Monocytes Absolute: 0.5 10*3/uL (ref 0.1–0.9)
Monocytes: 8 %
Neutrophils Absolute: 2.4 10*3/uL (ref 1.4–7.0)
Neutrophils: 42 %
Platelets: 287 10*3/uL (ref 150–450)
RBC: 4.09 x10E6/uL (ref 3.77–5.28)
RDW: 13.2 % (ref 11.7–15.4)
WBC: 5.8 10*3/uL (ref 3.4–10.8)

## 2023-05-23 LAB — HEMOGLOBIN A1C
Est. average glucose Bld gHb Est-mCnc: 148 mg/dL
Hgb A1c MFr Bld: 6.8 % — ABNORMAL HIGH (ref 4.8–5.6)

## 2023-05-23 LAB — LIPID PANEL
Chol/HDL Ratio: 5 ratio — ABNORMAL HIGH (ref 0.0–4.4)
Cholesterol, Total: 212 mg/dL — ABNORMAL HIGH (ref 100–199)
HDL: 42 mg/dL (ref >39)
LDL Chol Calc (NIH): 112 mg/dL — ABNORMAL HIGH (ref 0–99)
Triglycerides: 336 mg/dL — ABNORMAL HIGH (ref 0–149)
VLDL Cholesterol Cal: 58 mg/dL — ABNORMAL HIGH (ref 5–40)

## 2023-05-26 NOTE — Assessment & Plan Note (Signed)
 BP stable today. Continue lisinopril -hydrochlorothiazide  and propranolol  for management.

## 2023-05-26 NOTE — Assessment & Plan Note (Signed)
 Currently managed with zetia  and simvastatin . No alarm concerns at this time. Recommend diet and exercise monitoring. Exercise has been limited by foot surgery, but this is well healed. Walking 15-45m a day would be very beneficial.

## 2023-05-26 NOTE — Assessment & Plan Note (Signed)
 Currently working with weight management. Suggest close monitoring of diet, but avoidance of complete elimination of any specific food type to help maintain overall health. Now that foot is better, recommend starting walking about 15-20 minutes a day to increase strength and add time and distance as tolerated.

## 2023-05-26 NOTE — Assessment & Plan Note (Signed)
 Recently stopped tirzepatide for weight management and blood sugar management. Significant increase in appetite resulted, however, the side effects of the medication were too bothersome. Recommend close monitoring of diet with healthy options and daily walking 15-20 minutes to help build up stamina.

## 2023-05-26 NOTE — Assessment & Plan Note (Signed)
 Ongoing depression exacerbated by family estrangement. Engaged in regular counseling and advised to write letters for emotional distress.

## 2023-05-26 NOTE — Assessment & Plan Note (Signed)
 Diet and exercise management recommended. Unable to tolerate tirzepatide any longer due to side effects. Will work on increased activity.

## 2023-05-28 ENCOUNTER — Ambulatory Visit (INDEPENDENT_AMBULATORY_CARE_PROVIDER_SITE_OTHER): Admitting: Podiatry

## 2023-05-28 ENCOUNTER — Encounter: Payer: Self-pay | Admitting: Podiatry

## 2023-05-28 ENCOUNTER — Ambulatory Visit (INDEPENDENT_AMBULATORY_CARE_PROVIDER_SITE_OTHER)

## 2023-05-28 VITALS — Ht 63.5 in | Wt 231.0 lb

## 2023-05-28 DIAGNOSIS — M722 Plantar fascial fibromatosis: Secondary | ICD-10-CM | POA: Diagnosis not present

## 2023-05-28 DIAGNOSIS — M19071 Primary osteoarthritis, right ankle and foot: Secondary | ICD-10-CM | POA: Diagnosis not present

## 2023-05-28 MED ORDER — BETAMETHASONE SOD PHOS & ACET 6 (3-3) MG/ML IJ SUSP
3.0000 mg | Freq: Once | INTRAMUSCULAR | Status: AC
  Administered 2023-05-28: 3 mg via INTRA_ARTICULAR

## 2023-05-28 NOTE — Progress Notes (Signed)
   Chief Complaint  Patient presents with   Foot Pain    Right foot arthritis     Subjective: 68 y.o. female presenting today for follow-up evaluation of right foot arthritis.  She continues to have pain and tenderness.  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  Dermatology: Skin is warm, dry and supple bilateral lower extremities. Negative for open lesions or macerations bilateral.   Vascular: Dorsalis Pedis and Posterior Tibial pulses palpable bilateral.  Capillary fill time is immediate to all digits.  Neurological: Grossly intact via light touch  Musculoskeletal: Tenderness to palpation to the dorsal aspect of the right midfoot all other joints range of motion within normal limits bilateral. Strength 5/5 in all groups bilateral.   Radiographic exam RT foot 05/28/2023: Normal osseous mineralization.  No acute fractures identified.  Degenerative changes noted throughout the TMT specifically the 2nd and 3rd of the right foot.  Assessment: 1. Plantar fasciitis right 2. H/o third TMT revisional arthrodesis left with harvesting of bone autograft left calcaneus. DOS: 3/21/202; stable with routine healing 3.  RT midfoot DJD/arthritis   Plan of Care:  -Patient evaluated -Injection of 0.5 cc Celestone  Soluspan injected in the plantar  fascia right - Continue meloxicam  15 mg daily as needed -Patient currently has a pair of orthotics but they were too bulky in her shoes.  She will set up an appointment with our orthotics department for orthotics modification -Return to clinic with me as needed  Dot Gazella, DPM [facility]  Dr. Dot Gazella, DPM    [address] [place], [place] [address]                Office [phone]  Fax [phone]

## 2023-05-29 ENCOUNTER — Encounter: Payer: Self-pay | Admitting: Nurse Practitioner

## 2023-06-09 ENCOUNTER — Ambulatory Visit: Admitting: Podiatry

## 2023-08-13 ENCOUNTER — Ambulatory Visit (INDEPENDENT_AMBULATORY_CARE_PROVIDER_SITE_OTHER): Admitting: Podiatry

## 2023-08-13 DIAGNOSIS — M19071 Primary osteoarthritis, right ankle and foot: Secondary | ICD-10-CM | POA: Diagnosis not present

## 2023-08-13 MED ORDER — BETAMETHASONE SOD PHOS & ACET 6 (3-3) MG/ML IJ SUSP: 3.0000 mg | Freq: Once | INTRAMUSCULAR | Status: DC

## 2023-08-13 NOTE — Progress Notes (Signed)
   Chief Complaint  Patient presents with   Arthritis    Left foot. Arthritic pain is flaring up again. Patient would like another injection. She is doing her best to avoid surgery.     Subjective: 68 y.o. female presenting today for follow-up evaluation of right foot arthritis.  She continues to have pain and tenderness.  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  Dermatology: Skin is warm, dry and supple bilateral lower extremities. Negative for open lesions or macerations bilateral.   Vascular: Dorsalis Pedis and Posterior Tibial pulses palpable bilateral.  Capillary fill time is immediate to all digits.  Neurological: Grossly intact via light touch  Musculoskeletal: Tenderness to palpation to the dorsal aspect of the right midfoot all other joints range of motion within normal limits bilateral. Strength 5/5 in all groups bilateral.   Radiographic exam RT foot 05/28/2023: Normal osseous mineralization.  No acute fractures identified.  Degenerative changes noted throughout the TMT specifically the 2nd and 3rd of the right foot.  Assessment: 1. Plantar fasciitis right; currently minimally symptomatic 2. H/o third TMT revisional arthrodesis left with harvesting of bone autograft left calcaneus. DOS: 3/21/202; stable with routine healing 3.   RT midfoot DJD/arthritis   Plan of Care:  -Patient evaluated -Injection of 0.5 cc Celestone  Soluspan injected in the right dorsal midtarsal joint - Continue meloxicam  15 mg daily as needed -Return to clinic with me as needed  Thresa EMERSON Sar, DPM [facility]  Dr. Thresa EMERSON Sar, DPM    [address] [place], [place] [address]                Office [phone]  Fax [phone]

## 2023-08-14 NOTE — Telephone Encounter (Signed)
 Error

## 2023-08-26 ENCOUNTER — Ambulatory Visit: Payer: Medicare Other | Admitting: Obstetrics and Gynecology

## 2023-08-26 ENCOUNTER — Encounter: Payer: Self-pay | Admitting: Obstetrics and Gynecology

## 2023-08-26 ENCOUNTER — Ambulatory Visit (INDEPENDENT_AMBULATORY_CARE_PROVIDER_SITE_OTHER): Admitting: Obstetrics and Gynecology

## 2023-08-26 VITALS — BP 137/60 | HR 77

## 2023-08-26 DIAGNOSIS — Z87440 Personal history of urinary (tract) infections: Secondary | ICD-10-CM

## 2023-08-26 DIAGNOSIS — R351 Nocturia: Secondary | ICD-10-CM | POA: Diagnosis not present

## 2023-08-26 DIAGNOSIS — N390 Urinary tract infection, site not specified: Secondary | ICD-10-CM | POA: Insufficient documentation

## 2023-08-26 NOTE — Assessment & Plan Note (Addendum)
-   complete prophylactic antibiotics - continue with estrace  cream 3 times a week - call with any UTI concerns

## 2023-08-26 NOTE — Progress Notes (Signed)
 Lauren Briggs  SUBJECTIVE  History of Present Illness: Lauren Briggs is a 68 y.o. female seen in follow-up for recurrent UTI. Plan at last Briggs was to start vaginal estrace  cream and 6 months of macrobid  prophylaxis.   Has not had any UTIs. Has been consistent with the estrace  use three times a week. Has about 2 weeks left of macrobid . No current symptoms.   Not waking up at night frequently even though she stopped the oxybutynin .   Past Medical History: Patient  has a past medical history of Acute cystitis with hematuria (01/20/2022), Allergy, Anxiety (01-15-21), Arthritis, Back pain, Complication of anesthesia, Hyperlipidemia, Hypertension, Ischemic stroke (HCC), Low back pain radiating to right leg, Mixed hypercholesterolemia and hypertriglyceridemia (04/20/2012), Pre-diabetes, Prediabetes (11/10/2018), Reactive depression (situational) (03/11/2022), Shortness of breath dyspnea, and Urinary urgency (04/10/2022).   Past Surgical History: She  has a past surgical history that includes Nasal sinus surgery; Appendectomy; Carpal tunnel release; Closed reduction ulnar shaft; Cesarean section; Supracervical abdominal hysterectomy; Foot surgery; Back surgery; radial head (Right, 01/14/2016); Radial head arthroplasty (Right, 01/26/2016); Colonoscopy; Upper gastrointestinal endoscopy; Anterior lat lumbar fusion (N/A, 08/14/2018); Hardware Removal (N/A, 08/14/2018); Foot surgery (Left, 04/11/2022); Abdominal hysterectomy (1985); Joint replacement (broken elbow -); and Spine surgery (6 spine surgeries).   Medications: She has a current medication list which includes the following prescription(s): aspirin  ec, betamethasone  valerate ointment, escitalopram , estradiol , estradiol , ezetimibe , fenofibrate  micronized, fluconazole , hydrocodone -acetaminophen , lisinopril -hydrochlorothiazide , nitrofurantoin  (macrocrystal-monohydrate), propranolol , simvastatin , mounjaro, and trazodone , and the  following Facility-Administered Medications: betamethasone  acetate-betamethasone  sodium phosphate .   Allergies: Patient is allergic to versed  [midazolam ], iodine, iodinated contrast media, lipitor [atorvastatin ], zocor  [simvastatin ], and propoxyphene hcl.   Social History: Patient  reports that she quit smoking about 44 years ago. Her smoking use included cigarettes. She started smoking about 47 years ago. She quit smokeless tobacco use about 44 years ago. She reports current alcohol use. She reports that she does not use drugs.     OBJECTIVE     Physical Exam: Vitals:   08/26/23 1253  BP: 137/60  Pulse: 77   Gen: No apparent distress, A&O x 3.  Detailed Urogynecologic Evaluation:  Deferred.    ASSESSMENT AND PLAN    Lauren Briggs is a 68 y.o. with:  1. Recurrent urinary tract infection   2. Nocturia     Recurrent urinary tract infection Assessment & Plan: - complete prophylactic antibiotics - continue with estrace  cream 3 times a week - call with any UTI concerns   Nocturia Assessment & Plan: - Improved, no need for any medication at this time.    Follow up 6 months or sooner if needed   Rosaline LOISE Caper, MD  Time spent: I spent 15 minutes dedicated to the care of this patient on the date of this encounter to include pre-Briggs review of records, face-to-face time with the patient post Briggs documentation.

## 2023-08-26 NOTE — Assessment & Plan Note (Addendum)
-   Improved, no need for any medication at this time.

## 2023-09-05 ENCOUNTER — Other Ambulatory Visit: Payer: Self-pay | Admitting: Nurse Practitioner

## 2023-09-05 DIAGNOSIS — N3090 Cystitis, unspecified without hematuria: Secondary | ICD-10-CM

## 2023-09-05 NOTE — Telephone Encounter (Signed)
 Is she on this long term?

## 2023-09-19 ENCOUNTER — Other Ambulatory Visit: Payer: Self-pay | Admitting: Neurosurgery

## 2023-09-25 ENCOUNTER — Encounter: Payer: Self-pay | Admitting: Nurse Practitioner

## 2023-09-25 ENCOUNTER — Ambulatory Visit: Admitting: Nurse Practitioner

## 2023-09-25 ENCOUNTER — Encounter: Payer: Self-pay | Admitting: Neurosurgery

## 2023-09-25 VITALS — BP 124/82 | HR 78 | Wt 218.0 lb

## 2023-09-25 DIAGNOSIS — Z6841 Body Mass Index (BMI) 40.0 and over, adult: Secondary | ICD-10-CM

## 2023-09-25 DIAGNOSIS — M5124 Other intervertebral disc displacement, thoracic region: Secondary | ICD-10-CM | POA: Diagnosis not present

## 2023-09-25 DIAGNOSIS — R7303 Prediabetes: Secondary | ICD-10-CM

## 2023-09-25 DIAGNOSIS — E782 Mixed hyperlipidemia: Secondary | ICD-10-CM | POA: Diagnosis not present

## 2023-09-25 DIAGNOSIS — I10 Essential (primary) hypertension: Secondary | ICD-10-CM

## 2023-09-25 DIAGNOSIS — K760 Fatty (change of) liver, not elsewhere classified: Secondary | ICD-10-CM | POA: Diagnosis not present

## 2023-09-25 DIAGNOSIS — M5134 Other intervertebral disc degeneration, thoracic region: Secondary | ICD-10-CM

## 2023-09-25 NOTE — Assessment & Plan Note (Signed)
 Working on [employer] with 15lb weight loss in the last few months since starting Mounjaro. No concerns present at this time. Continue with dietary changes and begin exercise as soon as able following back surgery.

## 2023-09-25 NOTE — Assessment & Plan Note (Signed)
 Managing blood sugar levels with weight management, dietary control, and Mounjaro. She is doing very well with this. Will continue Mounjaro at 5mg  once a week and increase as she can tolerate.

## 2023-09-25 NOTE — Assessment & Plan Note (Signed)
 Currently managed with zetia  and simvastatin . No alarm concerns at this time. Recommend diet and exercise monitoring. Exercise has been limited by back pain and impending surgery. Will continue to monitor and make changes as necessary.

## 2023-09-25 NOTE — Assessment & Plan Note (Signed)
 The herniation is causing significant pain radiating around the shoulders. Previous treatments included nerve block injections, which provided limited relief. The herniation is likely due to stress from previous lumbar fusion. Chronic back pain persists post lumbar spinal fusion from L1 to S1. Pain management is challenging due to high tolerance to pain medications. Current regimen includes 7.5/325 mg pain medication every six hours, which provides some relief.She is aware of the need to stop Mounjaro one week prior to surgery.  - Proceed with scheduled surgery on November 10, 2023. - Stop Mounjaro one week prior to surgery. - Complete preoperative CT scan as ordered by the surgeon. - Continue current pain management regimen until surgery.

## 2023-09-25 NOTE — Patient Instructions (Signed)
 Let me know when you are in of refills of the Mounjaro and if you want to stay on the 5mg  or move on up to 7.5mg .   Best of luck on your surgery!!

## 2023-09-25 NOTE — Assessment & Plan Note (Signed)
 Obesity is being managed with Mounjaro, currently at a dose of 5 mg. I will take over management of this for her. She experiences waves of nausea, particularly on the second day post-injection, which is managed with ginger ale. She has poor tolerance to Ozempic . Constipation is present but managed with a high-fiber diet. Weight loss progress is noted with a reduction of approximately 15 pounds since starting Mounjaro. There is a prescription for 7.5 mg on file, but she is currently well-managed on 5 mg. She will let me know if she needs ongoing refills on the 5mg  dose once she has depleted her supply.  - Continue Mounjaro 5 mg as tolerated. - Monitor for need to increase to 7.5 mg if necessary. - Manage nausea with ginger ale and dietary adjustments. - Ensure high fiber intake to manage constipation. - Monitor weight loss progress at follow-up visits.

## 2023-09-25 NOTE — Progress Notes (Signed)
 Catheline Doing, DNP, AGNP-c [facility]  [address] [place], [place] [address] [phone]  ESTABLISHED PATIENT- Chronic Health and/or Follow-Up Visit on 09/25/2023  Blood pressure 124/82, pulse 78, weight 218 lb (98.9 kg).   HPI: History of Present Illness Lauren Briggs is a 68 year old female who presents for chronic management of weight loss with Mounjaro.  She has been on a 5 mg dose of Mounjaro for weight management, experiencing waves of nausea, particularly on the second day post-administration. She manages the nausea with ginger ale and finds pre-injection meals helpful. She previously tried Ozempic  but did not tolerate it well. She has lost approximately 15 pounds since starting Mounjaro, with her current weight at 218 pounds. She notes a significant reduction in food intake and is surprised by how little she needs to eat now compared to before. Her sister, also on Mounjaro, has experienced similar side effects and weight loss. She was previously seeing weight management with [facility], but her provider has left that practice and she asks for me to take over.   She is planning a trip to [facility] with her husband and has arranged for a scooter to accommodate her mobility needs due to her history of back issues. She is scheduled for back surgery on October 20th, 2025, and has been told she has a ruptured T11-T12 disc. She experiences pain radiating around her shoulders and back. Nerve block injections have provided limited relief, but she is managing with medication from the spinal specialist until she can have surgery. She has a history of lumbar fusion from L1 to S1 and regularly sees her back specialist.  Her social history includes a past of extensive gardening and canning, and she enjoys traditional foods like macaroni and cheese with canned tomatoes. She is planning to attend a [employer] concert in November, a month after her surgery.  All ROS  negative with exception of what is listed above.    PHYSICAL EXAM Physical Exam Vitals and nursing note reviewed.  Constitutional:      Appearance: Normal appearance.  HENT:     Head: Normocephalic.  Eyes:     Pupils: Pupils are equal, round, and reactive to light.  Neck:     Vascular: No carotid bruit.  Cardiovascular:     Rate and Rhythm: Normal rate and regular rhythm.     Pulses: Normal pulses.     Heart sounds: Normal heart sounds.  Pulmonary:     Effort: Pulmonary effort is normal.     Breath sounds: Normal breath sounds.  Abdominal:     General: Bowel sounds are normal. There is no distension.     Palpations: Abdomen is soft.     Tenderness: There is no abdominal tenderness. There is no guarding.  Musculoskeletal:     Cervical back: Normal range of motion.     Right lower leg: No edema.     Left lower leg: No edema.     Comments: Limited ROM in the spine.   Skin:    General: Skin is warm.     Capillary Refill: Capillary refill takes less than 2 seconds.  Neurological:     General: No focal deficit present.     Mental Status: She is alert and oriented to person, place, and time.  Psychiatric:        Mood and Affect: Mood normal.        Behavior: Behavior normal.     PLAN Problem List Items Addressed This Visit  Mixed hypercholesterolemia and hypertriglyceridemia   Currently managed with zetia  and simvastatin . No alarm concerns at this time. Recommend diet and exercise monitoring. Exercise has been limited by back pain and impending surgery. Will continue to monitor and make changes as necessary.       Essential hypertension   BP stable today. Continue lisinopril -hydrochlorothiazide  and propranolol  for management.       Prediabetes   Managing blood sugar levels with weight management, dietary control, and Mounjaro. She is doing very well with this. Will continue Mounjaro at 5mg  once a week and increase as she can tolerate.       Body mass index (BMI)  40.0-44.9, adult (HCC)   Obesity is being managed with Mounjaro, currently at a dose of 5 mg. I will take over management of this for her. She experiences waves of nausea, particularly on the second day post-injection, which is managed with ginger ale. She has poor tolerance to Ozempic . Constipation is present but managed with a high-fiber diet. Weight loss progress is noted with a reduction of approximately 15 pounds since starting Mounjaro. There is a prescription for 7.5 mg on file, but she is currently well-managed on 5 mg. She will let me know if she needs ongoing refills on the 5mg  dose once she has depleted her supply.  - Continue Mounjaro 5 mg as tolerated. - Monitor for need to increase to 7.5 mg if necessary. - Manage nausea with ginger ale and dietary adjustments. - Ensure high fiber intake to manage constipation. - Monitor weight loss progress at follow-up visits.      Fatty liver   Working on weight management with 15lb weight loss in the last few months since starting Mounjaro. No concerns present at this time. Continue with dietary changes and begin exercise as soon as able following back surgery.       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 Previous treatments included nerve block injections, which provided limited relief. The herniation is likely due to stress from previous lumbar fusion. Chronic back pain persists post lumbar spinal fusion from L1 to S1. Pain management is challenging due to high tolerance to pain medications. Current regimen includes 7.5/325 mg pain medication every six hours, which provides some relief.She is aware of the need to stop Mounjaro one week prior to surgery.  - Proceed with scheduled surgery on November 10, 2023. - Stop Mounjaro one week prior to surgery. - Complete preoperative CT scan as ordered by the surgeon. - Continue current  pain management regimen until surgery.          Return for already scheduled in November.  SaraBeth Hellen Shanley, DNP, AGNP-c Time: 35  minutes, >50% spent counseling, care coordination, chart review, and documentation.  SABRAtime

## 2023-09-25 NOTE — Assessment & Plan Note (Signed)
 BP stable today. Continue lisinopril -hydrochlorothiazide  and propranolol  for management.

## 2023-09-29 ENCOUNTER — Other Ambulatory Visit: Payer: Self-pay | Admitting: Podiatry

## 2023-10-02 ENCOUNTER — Other Ambulatory Visit: Payer: Self-pay | Admitting: Neurosurgery

## 2023-10-02 DIAGNOSIS — M5440 Lumbago with sciatica, unspecified side: Secondary | ICD-10-CM

## 2023-10-02 DIAGNOSIS — M47814 Spondylosis without myelopathy or radiculopathy, thoracic region: Secondary | ICD-10-CM

## 2023-10-08 ENCOUNTER — Ambulatory Visit
Admission: RE | Admit: 2023-10-08 | Discharge: 2023-10-08 | Disposition: A | Discharge status: Home / Self Care | Source: Ambulatory Visit | Attending: Neurosurgery | Admitting: Neurosurgery

## 2023-10-08 DIAGNOSIS — M5440 Lumbago with sciatica, unspecified side: Secondary | ICD-10-CM

## 2023-10-08 DIAGNOSIS — M47814 Spondylosis without myelopathy or radiculopathy, thoracic region: Secondary | ICD-10-CM

## 2023-10-13 ENCOUNTER — Encounter: Payer: Self-pay | Admitting: Podiatry

## 2023-10-13 ENCOUNTER — Ambulatory Visit (INDEPENDENT_AMBULATORY_CARE_PROVIDER_SITE_OTHER): Admitting: Podiatry

## 2023-10-13 ENCOUNTER — Ambulatory Visit: Admitting: Nurse Practitioner

## 2023-10-13 VITALS — Ht 63.5 in | Wt 218.0 lb

## 2023-10-13 DIAGNOSIS — M19071 Primary osteoarthritis, right ankle and foot: Secondary | ICD-10-CM

## 2023-10-13 MED ORDER — BETAMETHASONE SOD PHOS & ACET 6 (3-3) MG/ML IJ SUSP
3.0000 mg | Freq: Once | INTRAMUSCULAR | Status: AC
  Administered 2023-10-13: 3 mg via INTRA_ARTICULAR

## 2023-10-13 NOTE — Progress Notes (Signed)
   Chief Complaint  Patient presents with   Foot Pain    Pt is here due to right foot pain states she would like an injection to the right foot for pain.    Subjective: 68 y.o. female presenting today for follow-up evaluation of right foot arthritis.  She continues to have pain and tenderness.  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  Dermatology: Skin is warm, dry and supple bilateral lower extremities. Negative for open lesions or macerations bilateral.   Vascular: Dorsalis Pedis and Posterior Tibial pulses palpable bilateral.  Capillary fill time is immediate to all digits.  Neurological: Grossly intact via light touch  Musculoskeletal: Tenderness to palpation to the dorsal aspect of the right midfoot all other joints range of motion within normal limits bilateral. Strength 5/5 in all groups bilateral.   Radiographic exam RT foot 05/28/2023: Normal osseous mineralization.  No acute fractures identified.  Degenerative changes noted throughout the TMT specifically the 2nd and 3rd of the right foot.  Assessment: 1. Plantar fasciitis right; currently minimally symptomatic 2. H/o third TMT revisional arthrodesis left with harvesting of bone autograft left calcaneus. DOS: 3/21/202; stable with routine healing 3.  RT midfoot  DJD/arthritis   Plan of Care:  -Patient evaluated -Injection of 0.5 cc Celestone  Soluspan injected in the right dorsal midtarsal joint - Continue meloxicam  15 mg daily as needed -Return to clinic with me as needed  Thresa EMERSON Sar, DPM [facility]  Dr. Thresa EMERSON Sar, DPM    [address] [place], [place] [address]                Office [phone]  Fax [phone]

## 2023-10-22 ENCOUNTER — Ambulatory Visit: Admitting: Podiatry

## 2023-10-28 ENCOUNTER — Other Ambulatory Visit: Payer: Self-pay | Admitting: Nurse Practitioner

## 2023-10-28 DIAGNOSIS — E782 Mixed hyperlipidemia: Secondary | ICD-10-CM

## 2023-10-28 DIAGNOSIS — F418 Other specified anxiety disorders: Secondary | ICD-10-CM

## 2023-10-28 DIAGNOSIS — I10 Essential (primary) hypertension: Secondary | ICD-10-CM

## 2023-10-28 NOTE — Telephone Encounter (Signed)
 Last apt 09/25/23.

## 2023-10-30 ENCOUNTER — Other Ambulatory Visit: Payer: Self-pay | Admitting: Nurse Practitioner

## 2023-10-30 DIAGNOSIS — F418 Other specified anxiety disorders: Secondary | ICD-10-CM

## 2023-10-30 NOTE — Telephone Encounter (Signed)
 Last apt 09/25/23.

## 2023-11-04 ENCOUNTER — Encounter (HOSPITAL_COMMUNITY): Payer: Self-pay

## 2023-11-04 NOTE — Pre-Procedure Instructions (Signed)
 Surgical Instructions   Your procedure is scheduled on November 10, 2023. Report to [facility] Main Entrance A at 5:30 A.M., then check in with the Admitting office. Any questions or running late day of surgery: call [phone]  Questions prior to your surgery date: call [phone], Monday-Friday, 8am-4pm. If you experience any cold or flu symptoms such as cough, fever, chills, shortness of breath, etc. between now and your scheduled surgery, please notify us  at the above number.     Remember:  Do not eat or drink after midnight the night before your surgery    Take these medicines the morning of surgery with A SIP OF WATER: estradiol  (ESTRACE )  fenofibrate  micronized (LOFIBRA)    May take these medicines IF NEEDED: fluconazole  (DIFLUCAN )  HYDROcodone -acetaminophen  (NORCO)    Follow your surgeon's instructions on when to stop Aspirin .  If no instructions were given by your surgeon then you will need to call the office to get those instructions.     One week prior to surgery, STOP taking any Aleve, Naproxen, Ibuprofen, Motrin, Advil, Goody's, BC's, all herbal medications, fish oil, and non-prescription vitamins. This includes your medication: meloxicam  (MOBIC )    WHAT DO I DO ABOUT MY DIABETES MEDICATION?   STOP taking your tirzepatide [facility]) one week prior to surgery. DO NOT take any doses after October 12th.      HOW TO MANAGE YOUR DIABETES BEFORE AND AFTER SURGERY  Why is it important to control my blood sugar before and after surgery? Improving blood sugar levels before and after surgery helps healing and can limit problems. A way of improving blood sugar control is eating a healthy diet by:  Eating less sugar and carbohydrates  Increasing activity/exercise  Talking with your doctor about reaching your blood sugar goals High blood sugars (greater than 180 mg/dL) can raise your risk of infections and slow your recovery, so you will need to focus on controlling  your diabetes during the weeks before surgery. Make sure that the doctor who takes care of your diabetes knows about your planned surgery including the date and location.  How do I manage my blood sugar before surgery? Check your blood sugar at least 4 times a day, starting 2 days before surgery, to make sure that the level is not too high or low.  Check your blood sugar the morning of your surgery when you wake up and every 2 hours until you get to the Short Stay unit.  If your blood sugar is less than 70 mg/dL, you will need to treat for low blood sugar: Do not take insulin . Treat a low blood sugar (less than 70 mg/dL) with  cup of clear juice (cranberry or apple), 4 glucose tablets, OR glucose gel. Recheck blood sugar in 15 minutes after treatment (to make sure it is greater than 70 mg/dL). If your blood sugar is not greater than 70 mg/dL on recheck, call [phone] for further instructions. Report your blood sugar to the short stay nurse when you get to Short Stay.  If you are admitted to the hospital after surgery: Your blood sugar will be checked by the staff and you will probably be given insulin  after surgery (instead of oral diabetes medicines) to make sure you have good blood sugar levels. The goal for blood sugar control after surgery is 80-180 mg/dL.                      Do NOT Smoke (Tobacco/Vaping) for 24 hours prior  to your procedure.  If you use a CPAP at night, you may bring your mask/headgear for your overnight stay.   You will be asked to remove any contacts, glasses, piercing's, hearing aid's, dentures/partials prior to surgery. Please bring cases for these items if needed.    Patients discharged the day of surgery will not be allowed to drive home, and someone needs to stay with them for 24 hours.  SURGICAL WAITING ROOM VISITATION Patients may have no more than 2 support people in the waiting area - these visitors may rotate.   Pre-op nurse will coordinate an  appropriate time for 1 ADULT support person, who may not rotate, to accompany patient in pre-op.  Children under the age of 56 must have an adult with them who is not the patient and must remain in the main waiting area with an adult.  If the patient needs to stay at the hospital during part of their recovery, the visitor guidelines for inpatient rooms apply.  Please refer to the [facility] website for the visitor guidelines for any additional information.   If you received a COVID test during your pre-op visit  it is requested that you wear a mask when out in public, stay away from anyone that may not be feeling well and notify your surgeon if you develop symptoms. If you have been in contact with anyone that has tested positive in the last 10 days please notify you surgeon.      Pre-operative 4 CHG Bathing Instructions   You can play a key role in reducing the risk of infection after surgery. Your skin needs to be as free of germs as possible. You can reduce the number of germs on your skin by washing with CHG (chlorhexidine  gluconate) soap before surgery. CHG is an antiseptic soap that kills germs and continues to kill germs even after washing.   DO NOT use if you have an allergy to chlorhexidine /CHG or antibacterial soaps. If your skin becomes reddened or irritated, stop using the CHG and notify one of our RNs at [phone].   Please shower with the CHG soap starting 4 days before surgery using the following schedule:     Please keep in mind the following:  DO NOT shave, including legs and underarms, starting the day of your first shower.   You may shave your face at any point before/day of surgery.  Place clean sheets on your bed the day you start using CHG soap. Use a clean washcloth (not used since being washed) for each shower. DO NOT sleep with pets once you start using the CHG.   CHG Shower Instructions:  Wash your face and private area with normal soap. If you choose to  wash your hair, wash first with your normal shampoo.  After you use shampoo/soap, rinse your hair and body thoroughly to remove shampoo/soap residue.  Turn the water OFF and apply  bottle of CHG soap to a CLEAN washcloth.  Apply CHG soap ONLY FROM YOUR NECK DOWN TO YOUR TOES (washing for 3-5 minutes)  DO NOT use CHG soap on face, private areas, open wounds, or sores.  Pay special attention to the area where your surgery is being performed.  If you are having back surgery, having someone wash your back for you may be helpful. Wait 2 minutes after CHG soap is applied, then you may rinse off the CHG soap.  Pat dry with a clean towel  Put on clean clothes/pajamas   If you  choose to wear lotion, please use ONLY the CHG-compatible lotions that are listed below.  Additional instructions for the day of surgery:  If you choose, you may shower the morning of surgery with an antibacterial soap.  DO NOT APPLY any lotions, deodorants, cologne, or perfumes.   Do not bring valuables to the hospital. [facility] is not responsible for any belongings/valuables. Do not wear nail polish, gel polish, artificial nails, or any other type of covering on natural nails (fingers and toes) Do not wear jewelry or makeup Put on clean/comfortable clothes.  Please brush your teeth.  Ask your nurse before applying any prescription medications to the skin.     CHG Compatible Lotions   Aveeno Moisturizing lotion  Cetaphil Moisturizing Cream  Cetaphil Moisturizing Lotion  Clairol Herbal Essence Moisturizing Lotion, Dry Skin  Clairol Herbal Essence Moisturizing Lotion, Extra Dry Skin  Clairol Herbal Essence Moisturizing Lotion, Normal Skin  Curel Age Defying Therapeutic Moisturizing Lotion with Alpha Hydroxy  Curel Extreme Care Body Lotion  Curel Soothing Hands Moisturizing Hand Lotion  Curel Therapeutic Moisturizing Cream, Fragrance-Free  Curel Therapeutic Moisturizing Lotion, Fragrance-Free  Curel Therapeutic  Moisturizing Lotion, Original Formula  Eucerin Daily Replenishing Lotion  Eucerin Dry Skin Therapy Plus Alpha Hydroxy Crme  Eucerin Dry Skin Therapy Plus Alpha Hydroxy Lotion  Eucerin Original Crme  Eucerin Original Lotion  Eucerin Plus Crme Eucerin Plus Lotion  Eucerin TriLipid Replenishing Lotion  Keri Anti-Bacterial Hand Lotion  Keri Deep Conditioning Original Lotion Dry Skin Formula Softly Scented  Keri Deep Conditioning Original Lotion, Fragrance Free Sensitive Skin Formula  Keri Lotion Fast Absorbing Fragrance Free Sensitive Skin Formula  Keri Lotion Fast Absorbing Softly Scented Dry Skin Formula  Keri Original Lotion  Keri Skin Renewal Lotion Keri Silky Smooth Lotion  Keri Silky Smooth Sensitive Skin Lotion  Nivea Body Creamy Conditioning Oil  Nivea Body Extra Enriched Lotion  Nivea Body Original Lotion  Nivea Body Sheer Moisturizing Lotion Nivea Crme  Nivea Skin Firming Lotion  NutraDerm 30 Skin Lotion  NutraDerm Skin Lotion  NutraDerm Therapeutic Skin Cream  NutraDerm Therapeutic Skin Lotion  ProShield Protective Hand Cream  Provon moisturizing lotion  Please read over the following fact sheets that you were given.

## 2023-11-05 ENCOUNTER — Encounter (HOSPITAL_COMMUNITY): Payer: Self-pay

## 2023-11-05 ENCOUNTER — Encounter (HOSPITAL_COMMUNITY)
Admission: RE | Admit: 2023-11-05 | Discharge: 2023-11-05 | Disposition: A | Discharge status: Home / Self Care | Source: Ambulatory Visit | Attending: Neurosurgery | Admitting: Neurosurgery

## 2023-11-05 ENCOUNTER — Other Ambulatory Visit: Payer: Self-pay

## 2023-11-05 VITALS — BP 158/74 | HR 73 | Temp 98.5°F | Resp 16 | Ht 64.0 in | Wt 212.0 lb

## 2023-11-05 DIAGNOSIS — Z01818 Encounter for other preprocedural examination: Secondary | ICD-10-CM | POA: Insufficient documentation

## 2023-11-05 DIAGNOSIS — I2510 Atherosclerotic heart disease of native coronary artery without angina pectoris: Secondary | ICD-10-CM | POA: Diagnosis not present

## 2023-11-05 DIAGNOSIS — E119 Type 2 diabetes mellitus without complications: Secondary | ICD-10-CM | POA: Diagnosis not present

## 2023-11-05 HISTORY — DX: Atherosclerotic heart disease of native coronary artery without angina pectoris: I25.10

## 2023-11-05 HISTORY — DX: Type 2 diabetes mellitus without complications: E11.9

## 2023-11-05 HISTORY — DX: Family history of other specified conditions: Z84.89

## 2023-11-05 LAB — CBC
HCT: 38.8 % (ref 36.0–46.0)
Hemoglobin: 12.3 g/dL (ref 12.0–15.0)
MCH: 29.3 pg (ref 26.0–34.0)
MCHC: 31.7 g/dL (ref 30.0–36.0)
MCV: 92.4 fL (ref 80.0–100.0)
Platelets: 383 K/uL (ref 150–400)
RBC: 4.2 MIL/uL (ref 3.87–5.11)
RDW: 12.9 % (ref 11.5–15.5)
WBC: 6.4 K/uL (ref 4.0–10.5)
nRBC: 0 % (ref 0.0–0.2)

## 2023-11-05 LAB — HEMOGLOBIN A1C
Hgb A1c MFr Bld: 5.7 % — ABNORMAL HIGH (ref 4.8–5.6)
Mean Plasma Glucose: 116.89 mg/dL

## 2023-11-05 LAB — SURGICAL PCR SCREEN
MRSA, PCR: NEGATIVE
Staphylococcus aureus: NEGATIVE

## 2023-11-05 LAB — BASIC METABOLIC PANEL WITH GFR
Anion gap: 11 (ref 5–15)
BUN: 22 mg/dL (ref 8–23)
CO2: 26 mmol/L (ref 22–32)
Calcium: 9.5 mg/dL (ref 8.9–10.3)
Chloride: 103 mmol/L (ref 98–111)
Creatinine, Ser: 1.09 mg/dL — ABNORMAL HIGH (ref 0.44–1.00)
GFR, Estimated: 55 mL/min — ABNORMAL LOW (ref >60)
Glucose, Bld: 102 mg/dL — ABNORMAL HIGH (ref 70–99)
Potassium: 3.9 mmol/L (ref 3.5–5.1)
Sodium: 140 mmol/L (ref 135–145)

## 2023-11-05 LAB — TYPE AND SCREEN
ABO/RH(D): O POS
Antibody Screen: NEGATIVE

## 2023-11-05 LAB — GLUCOSE, CAPILLARY: Glucose-Capillary: 101 mg/dL — ABNORMAL HIGH (ref 70–99)

## 2023-11-05 NOTE — Progress Notes (Signed)
 PCP - Camie Doing, NP Cardiologist - Dr. Vinie [facility]cy [facility] - last office visit 09/01/2020  PPM/ICD - Denies Device Orders - n/a Rep Notified - n/a  Chest x-ray - n/a EKG - 11/05/2023 Stress Test - Denies ECHO - Denies Cardiac Cath - Denies  Sleep Study - Denies CPAP - n/a  Pt recently diagnosed DM (was pre-DM for a while). She does not have glucose meter at home and does not know normal fasting range. CBG 101 at pre-op appointment. A1c result pending.  Last dose of GLP1 agonist- Last dose of Mounjaro 10/4 GLP1 instructions: Pt instructed to continue holding medication until after surgery  Blood Thinner Instructions: n/a Aspirin  Instructions: Pt has already stopped ASA. Last dose was 10/11  NPO after midnight  COVID TEST- n/a   Anesthesia review: Yes. Hx of HTN, CAD, DM2 and CVA after receiving too much versed  during induction in 2021 for spine surgery. Pt is taking diflucan  and macrobid  for UTI which she states is much better.  Patient denies shortness of breath, fever, cough and chest pain at PAT appointment. Pt denies any respiratory illness/infection in the last two months   All instructions explained to the patient, with a verbal understanding of the material. Patient agrees to go over the instructions while at home for a better understanding. Patient also instructed to self quarantine after being tested for COVID-19. The opportunity to ask questions was provided.

## 2023-11-06 NOTE — Progress Notes (Signed)
 Anesthesia Chart Review:  Case: [id number] Date/Time: 11/10/23 0715   Procedure: LAMINECTOMY WITH POSTERIOR LATERAL ARTHRODESIS LEVEL 3 (Back) - Exploration and extension of fusion - T10-T11 - T11-T12 - T12-L1 with decompressive laminectomy T11-12   Anesthesia type: General   Diagnosis: Thoracic spondylosis [M47.814]   Pre-op diagnosis: Thoracic spondylosis   [facility] ROOM 21 / [facility]   Surgeons: Onetha Kuba, MD       DISCUSSION: Patient is a 68 year old female scheduled for the above procedure.  History includes former smoker (quit 1981), HTN, HLD, DM2, CVA (believed to have occurred ~ 12/2019 post-op, but diagnosed by 10/19/2020 MRI/MRA: left PCA occlusion with remote left PCA territory & right lenticulostriate  infarcts), dyspnea, spinal surgery (L3-4 microdiscectomy 05/07/2006; L3-5 XLIF 09/27/2009; L5-S1 PLIF 04/22/2012, s/p revision with iliac crest bone graft 07/11/2014; L2-3 anterolateral fusion, posterolateral arthrodesis 08/14/2018; exploration L2-3 fusion, L1-2 PLIF 12/22/2019) right radial head fracture (s/p radial head replacement 01/26/2016), right cubital tunnel release (09/11/2022).   She reported a prior anesthesia complication of a chipped front tooth. She also had persistent memory issues following 12/22/2019 PLIF and was later found to have remote infarct on brain MRI 10/19/2020. (See below) She attributes CVA to the amount of Versed  administered. Per anesthesia records, she received Versed  2 mg on 12/22/2019, 08/14/2018, 07/11/2014, 04/22/2012. With subsequent surgery she declined use of Versed  and has it listed on her allergy/contraindication list.   As above, prior history of CVA. She had reported symptoms began after 12/22/2019 back surgery. No immediate post procedure anesthesia complications noted, and Discharge Summary described hospitalization as routine. However, out-patient notes soon after do mention she noted some difficulty with memory and word-finding after surgery. Neurology  notes also mention some temporary right eye visual changes. It appears Dr. Onetha ultimately ordered a MRI/MRA of the brain which was done on 10/19/2020 and showed remote left PCA territory & right lenticulostriate infarcts, as well as left PCA occlusion with moderate atherosclerotic changes in various intracranial vessels. She subsequently had neurology evaluation by Dr. Eather Popp and an EEG, TTE, and carotid US . December EEG was normal. January 2023 TTE showed LVEF 65-70%, no RWMA, mild LVH, normal diastolic parameters, normal RV systolic function, trivial TR, no atrial level shunt detected by color flow Doppler. January 2023 carotid US  showed near normal/only minimal bilateral ICA plaque. Per Dr. Bucky noted, likely left PCA infarct secondary to intracranial atherosclerosis. Abnormal MRI also showing remote age right basal ganglia infarct and MRA showing diffuse intracranial atherosclerotic changes. Vascular risk factor modification recommended. He also referred her for a sleep study.    She is not currently followed by cardiology, but she was referred to Dr. Dorn Lesches in 2020 for risk factor modification for HTN, HLD, elevated CAC score (69, 81st percentile 11/2018), and family history of CD. He referred her to Dr. Vinie Maxcy [facility]. She was started on Vascepa , Zetia , fenofibrate , and simvastatin  and was working with [facility]. Last visit 09/01/2020. She notified his office a weeks weeks later that she would have to stop Vascepa  because it was cost prohibitive under new tier coverage ($560/3 months). She is currently on simvastatin  20 mg daily per primary care. She is not currently taking Zetia . Her last Lipid panel was in May 2025 with primary care. She denied SOB and chest pain at PAT RN viist.    She had primary care follow-up with PCP Early, Camie BRAVO, NP on 09/25/2023. HTN stable. Prediabetes managed with weight management/Mounjaro and  felt to be controlled. HLD  managed with Zetia  and simvastatin . Exercise was currently limited due to her back pain. Encouraged to resume following recovery from back surgery. In regards to preoperative evaluation, she wrote: - Proceed with scheduled surgery on November 10, 2023. - Stop Mounjaro one week prior to surgery. - Complete preoperative CT scan as ordered by the surgeon. - Continue current pain management regimen until surgery.   A1c 5.7%. Mounjaro is on hold, last dose 10/25/2023.   She reported last ASA 11/01/2023.    Anesthesia team to evaluate on the day of surgery.   VS: BP (!) 158/74   Pulse 73   Temp 36.9 C   Resp 16   Ht 5' 4 (1.626 m)   Wt 96.2 kg   SpO2 98%   BMI 36.39 kg/m    PROVIDERS: Early, Camie BRAVO, NP is PCP  Marilynne Browning, MD is Uro-Gyn. Seen on 08/26/2023 for recurrent UTI. Advised to complete prophylactic antibiotics, continue estrace  cream 3x/week.  Rosemarie Rothman, MD is neurologist, last visit 12/17/2021. Mona Vinie BROCKS, MD is cardiologist ([facility]), last visit 09/18/2020.   LABS: Labs reviewed: Acceptable for surgery. LFTs normal on 05/22/2023. (all labs ordered are listed, but only abnormal results are displayed)  Labs Reviewed  HEMOGLOBIN A1C - Abnormal; Notable for the following components:      Result Value   Hgb A1c MFr Bld 5.7 (*)    All other components within normal limits  BASIC METABOLIC PANEL WITH GFR - Abnormal; Notable for the following components:   Glucose, Bld 102 (*)    Creatinine, Ser 1.09 (*)    GFR, Estimated 55 (*)    All other components within normal limits  GLUCOSE, CAPILLARY - Abnormal; Notable for the following components:   Glucose-Capillary 101 (*)    All other components within normal limits  SURGICAL PCR SCREEN  CBC  TYPE AND SCREEN    OTHER: EEG 01/03/2021: Summary  Normal electroencephalogram, awake, asleep and with activation procedures. There are no focal lateralizing or epileptiform features.    IMAGES: CT  T/L-spine 10/08/2023: IMPRESSION: 1. L1-3 PLIF without hardware adverse features. 2. L3-4 interbody and interspinous fusion without adverse features. 3. L4-5 and L5-S1 interbody fusion with posterior decompression. 4. No thoracic or lumbar spinal canal stenosis.  MRI L-spine 07/24/2023 (Canopy/PACS): IMPRESSION: 1. Interval posterior decompression and PLIF at L1-2 without complicating features. The spinal canal and foramina are widely patent. 2. Stable appearance following previous posterior decompression and fusion from L2-3 through L5-S1. The spinal canal and foramina remain widely patent at these levels. 3. Interval significant disc and endplate degeneration at T11-12 with mild spinal stenosis and mild to moderate foraminal narrowing bilaterally. This level is not imaged in the axial plane and potentially symptomatic. 4. No acute findings.   MRI T-spine 07/24/2023 (Canopy/PACS): IMPRESSION: 1. Focally advanced disc degeneration at T11-12 with moderate spinal stenosis and moderate right and mild left neural foraminal stenosis. 2. Mild disc and moderate facet degeneration elsewhere without stenosis.   MRI Brain 10/19/2020: IMPRESSION: 1. No acute intracranial abnormality. 2. Remote right lenticulostriate and left PCA territory infarcts.  MRA Brain 10/19/2020: IMPRESSION: 1. Left PCA occlusion at the proximal P2 segment. Evidence for associated left PCA territory infarct. 2. Extensive atheromatous irregularity throughout the right PCA with associated moderate to severe right P2 and P3 stenoses. 3. Additional moderately advanced intracranial atherosclerotic disease with associated mild to moderate left M1 stenoses, moderate proximal left A2 stenosis, with moderate to severe right M2  and M3 stenoses as above.    EKG: 11/05/2023: Sinus rhythm with Premature atrial complexes Possible Inferior infarct , age undetermined Abnormal ECG When compared with ECG of 04-Jul-2014  09:48, PREVIOUS ECG IS PRESENT PAC new Confirmed by Delford Coy [phone]) on 11/05/2023 8:55:37 PM   CV: Echo 01/30/2021: IMPRESSIONS   1. Left ventricular ejection fraction, by estimation, is 65 to 70%. The  left ventricle has normal function. The left ventricle has no regional  wall motion abnormalities. There is mild left ventricular hypertrophy.  Left ventricular diastolic parameters  were normal.   2. Right ventricular systolic function is normal. The right ventricular  size is normal.   3. The mitral valve is normal in structure. No evidence of mitral valve  regurgitation.   4. The aortic valve is tricuspid. Aortic valve regurgitation is not  visualized.   5. The inferior vena cava is normal in size with greater than 50%  respiratory variability, suggesting right atrial pressure of 3 mmHg.   US  Carotid 01/30/2021: Summary:  - Right Carotid: The extracranial vessels were near-normal with only minimal wall thickening or plaque.  - Left Carotid: The extracranial vessels were near-normal with only minimal wall thickening or plaque.  - Vertebrals:  Bilateral vertebral arteries demonstrate antegrade flow.  - Subclavians: Normal flow hemodynamics were seen in bilateral subclavian arteries.   CT Coronary Calcium  Score 12/21/2018: IMPRESSION: Coronary calcium  score of 69. This was 84 percentile for age and sex matched control.    Past Medical History:  Diagnosis Date   Acute cystitis with hematuria 01/20/2022   Allergy    Anxiety 01-15-21   Arthritis    Back pain    Complication of anesthesia    HX Back CHIPPED TOOTH S/P EXTUBATION    Complication of anesthesia    Pt received too much versed  during induction which led to ischemic stroke   Coronary artery disease    Diabetes mellitus without complication (HCC)    Family history of adverse reaction to anesthesia    Mother had N/V   Hyperlipidemia    Hypertension    Ischemic stroke (HCC)    occured during back surgery    Low back pain radiating to right leg    Mixed hypercholesterolemia and 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   Pre-diabetes    Prediabetes 11/10/2018   Reactive depression (situational) 03/11/2022   Shortness of breath dyspnea    Urinary urgency 04/10/2022    Past Surgical History:  Procedure Laterality Date   ANTERIOR LAT LUMBAR FUSION N/A 08/14/2018   Procedure: extreme lateral interbody fusion - Lumbar two-Lumbar three  cortical screws and removal pedicle screws Lumbar -Sacral one;  Surgeon: Onetha Kuba, MD;  Location: [facility] OR;  Service: Neurosurgery;  Laterality: N/A;   APPENDECTOMY  1977   CARPAL TUNNEL RELEASE     BIL    CATARACT EXTRACTION W/ INTRAOCULAR LENS IMPLANT Bilateral    CESAREAN SECTION  1982   CLOSED REDUCTION ULNAR SHAFT     COLONOSCOPY     FOOT SURGERY Left 04/11/2022   FOOT SURGERY Left    Fusion   HARDWARE REMOVAL N/A 08/14/2018   Procedure: Removal pedicle screws Lumbar -Sacral one with cortical screw fixation lumbar two-three;  Surgeon: Onetha Kuba, MD;  Location: [facility] OR;  Service: Neurosurgery;  Laterality: N/A;   NASAL SINUS SURGERY  1994   radial head Right 01/14/2016   at [facility]   RADIAL HEAD ARTHROPLASTY Right 01/26/2016   Procedure: RIGHT RADIAL HEAD REPLACEMENT ARTHROPLASTY WITH LIGAMENT REPAIRS;  Surgeon: Donnice Robinsons, MD;  Location: [facility];  Service: Orthopedics;  Laterality: Right;  Right elbow raial head replacement with possible ligament repairs as needed    SPINE SURGERY  6 spine surgeries   Fusion L1-S1   SUPRACERVICAL ABDOMINAL HYSTERECTOMY  1985   at time of c-section   ULNAR NERVE REPAIR Right    UPPER GASTROINTESTINAL ENDOSCOPY      MEDICATIONS:  aspirin  EC 81 MG tablet   Cholecalciferol (VITAMIN D3) 125 MCG (5000 UT) TABS   Coenzyme Q10 (CO Q-10) 100 MG CAPS   escitalopram  (LEXAPRO ) 10 MG tablet   estradiol  (ESTRACE ) 0.1 MG/GM vaginal cream   estradiol  (ESTRACE ) 1 MG tablet   ezetimibe  (ZETIA ) 10 MG tablet    fenofibrate  micronized (LOFIBRA) 134 MG capsule   fluconazole  (DIFLUCAN ) 150 MG tablet   HYDROcodone -acetaminophen  (NORCO) 10-325 MG tablet   lisinopril -hydrochlorothiazide  (ZESTORETIC ) 20-12.5 MG tablet   meloxicam  (MOBIC ) 15 MG tablet   Multiple Vitamins-Minerals (CENTRUM SILVER 50+WOMEN) TABS   nitrofurantoin , macrocrystal-monohydrate, (MACROBID ) 100 MG capsule   propranolol  (INDERAL ) 20 MG tablet   simvastatin  (ZOCOR ) 20 MG tablet   tirzepatide (MOUNJARO) 7.5 MG/0.5ML Pen   traZODone  (DESYREL ) 50 MG tablet   No current facility-administered medications for this encounter.    Isaiah Ruder, PA-C Surgical Short Stay/Anesthesiology [facility] Phone [phone] [facility] Phone [phone] 11/06/2023 3:33 PM

## 2023-11-06 NOTE — Anesthesia Preprocedure Evaluation (Addendum)
 Anesthesia Evaluation  Patient identified by MRN, date of birth, ID band Patient awake    Reviewed: Allergy & Precautions, NPO status , Patient's Chart, lab work & pertinent test results, reviewed documented beta blocker date and time   History of Anesthesia Complications Negative for: history of anesthetic complications  Airway Mallampati: III  TM Distance: >3 FB Neck ROM: Full    Dental no notable dental hx.    Pulmonary neg sleep apnea, former smoker   Pulmonary exam normal breath sounds clear to auscultation       Cardiovascular hypertension, + CAD  Normal cardiovascular exam Rhythm:Regular Rate:Normal     Neuro/Psych  PSYCHIATRIC DISORDERS Anxiety Depression    Thoracic spondylosis CVA    GI/Hepatic negative GI ROS, Neg liver ROS,,,  Endo/Other  diabetes, Type 2    Renal/GU negative Renal ROS     Musculoskeletal  (+) Arthritis ,    Abdominal   Peds  Hematology negative hematology ROS (+)   Anesthesia Other Findings   Reproductive/Obstetrics                              Anesthesia Physical Anesthesia Plan  ASA: 3  Anesthesia Plan: General   Post-op Pain Management: Tylenol  PO (pre-op)*   Induction: Intravenous  PONV Risk Score and Plan: 3 and Ondansetron , Dexamethasone  and Treatment may vary due to age or medical condition  Airway Management Planned: Oral ETT  Additional Equipment: Arterial line  Intra-op Plan:   Post-operative Plan: Extubation in OR  Informed Consent: I have reviewed the patients History and Physical, chart, labs and discussed the procedure including the risks, benefits and alternatives for the proposed anesthesia with the patient or authorized representative who has indicated his/her understanding and acceptance.     Dental advisory given  Plan Discussed with: CRNA  Anesthesia Plan Comments: (See PAT note written 11/06/2023 by Allison Zelenak,  PA-C.  She reported a prior anesthesia complication of a chipped front tooth. She also had persistent memory issues following 12/22/2019 PLIF and was later found to have remote infarct on brain MRI 10/19/2020 with evidence of left PCA occlusion. Per Dr. Bucky notes suggest infarcts likely secondary to intracranial atherosclerosis. She reportedly attributes CVA to the amount of Versed  administered. Per anesthesia records, she received Versed  2 mg on 12/22/2019, 08/14/2018, 07/11/2014, 04/22/2012. With subsequent surgery she declined use of Versed  and has it listed on her allergy/contraindication list.  )         Anesthesia Quick Evaluation

## 2023-11-10 ENCOUNTER — Encounter (HOSPITAL_COMMUNITY): Admission: RE | Disposition: A | Payer: Self-pay | Source: Home / Self Care | Attending: Neurosurgery

## 2023-11-10 ENCOUNTER — Inpatient Hospital Stay (HOSPITAL_COMMUNITY): Payer: Self-pay | Admitting: Vascular Surgery

## 2023-11-10 ENCOUNTER — Inpatient Hospital Stay (HOSPITAL_COMMUNITY)
Admission: RE | Admit: 2023-11-10 | Discharge: 2023-11-11 | DRG: 448 | Disposition: A | Discharge status: Home / Self Care | Attending: Neurosurgery | Admitting: Neurosurgery

## 2023-11-10 ENCOUNTER — Inpatient Hospital Stay (HOSPITAL_COMMUNITY)

## 2023-11-10 ENCOUNTER — Inpatient Hospital Stay (HOSPITAL_COMMUNITY): Payer: Self-pay | Admitting: Anesthesiology

## 2023-11-10 ENCOUNTER — Encounter (HOSPITAL_COMMUNITY): Payer: Self-pay | Admitting: Neurosurgery

## 2023-11-10 DIAGNOSIS — I10 Essential (primary) hypertension: Secondary | ICD-10-CM

## 2023-11-10 DIAGNOSIS — Z981 Arthrodesis status: Secondary | ICD-10-CM

## 2023-11-10 DIAGNOSIS — I2510 Atherosclerotic heart disease of native coronary artery without angina pectoris: Secondary | ICD-10-CM | POA: Diagnosis present

## 2023-11-10 DIAGNOSIS — M5104 Intervertebral disc disorders with myelopathy, thoracic region: Secondary | ICD-10-CM | POA: Diagnosis present

## 2023-11-10 DIAGNOSIS — Z83438 Family history of other disorder of lipoprotein metabolism and other lipidemia: Secondary | ICD-10-CM

## 2023-11-10 DIAGNOSIS — Z791 Long term (current) use of non-steroidal anti-inflammatories (NSAID): Secondary | ICD-10-CM | POA: Diagnosis not present

## 2023-11-10 DIAGNOSIS — M4804 Spinal stenosis, thoracic region: Secondary | ICD-10-CM

## 2023-11-10 DIAGNOSIS — Z7982 Long term (current) use of aspirin: Secondary | ICD-10-CM | POA: Diagnosis not present

## 2023-11-10 DIAGNOSIS — M48061 Spinal stenosis, lumbar region without neurogenic claudication: Secondary | ICD-10-CM | POA: Diagnosis present

## 2023-11-10 DIAGNOSIS — Z823 Family history of stroke: Secondary | ICD-10-CM | POA: Diagnosis not present

## 2023-11-10 DIAGNOSIS — Z87891 Personal history of nicotine dependence: Secondary | ICD-10-CM | POA: Diagnosis not present

## 2023-11-10 DIAGNOSIS — E119 Type 2 diabetes mellitus without complications: Secondary | ICD-10-CM | POA: Diagnosis present

## 2023-11-10 DIAGNOSIS — F419 Anxiety disorder, unspecified: Secondary | ICD-10-CM | POA: Diagnosis present

## 2023-11-10 DIAGNOSIS — E781 Pure hyperglyceridemia: Secondary | ICD-10-CM | POA: Diagnosis present

## 2023-11-10 DIAGNOSIS — Z90710 Acquired absence of both cervix and uterus: Secondary | ICD-10-CM

## 2023-11-10 DIAGNOSIS — Z8249 Family history of ischemic heart disease and other diseases of the circulatory system: Secondary | ICD-10-CM | POA: Diagnosis not present

## 2023-11-10 DIAGNOSIS — E7800 Pure hypercholesterolemia, unspecified: Secondary | ICD-10-CM | POA: Diagnosis present

## 2023-11-10 DIAGNOSIS — Z91041 Radiographic dye allergy status: Secondary | ICD-10-CM | POA: Diagnosis not present

## 2023-11-10 DIAGNOSIS — Z79899 Other long term (current) drug therapy: Secondary | ICD-10-CM | POA: Diagnosis not present

## 2023-11-10 DIAGNOSIS — Z8673 Personal history of transient ischemic attack (TIA), and cerebral infarction without residual deficits: Secondary | ICD-10-CM | POA: Diagnosis not present

## 2023-11-10 DIAGNOSIS — Z888 Allergy status to other drugs, medicaments and biological substances status: Secondary | ICD-10-CM | POA: Diagnosis not present

## 2023-11-10 HISTORY — PX: LAMINECTOMY WITH POSTERIOR LATERAL ARTHRODESIS LEVEL 3: SHX6337

## 2023-11-10 LAB — GLUCOSE, CAPILLARY
Glucose-Capillary: 136 mg/dL — ABNORMAL HIGH (ref 70–99)
Glucose-Capillary: 188 mg/dL — ABNORMAL HIGH (ref 70–99)

## 2023-11-10 SURGERY — LAMINECTOMY WITH POSTERIOR LATERAL ARTHRODESIS LEVEL 3
Anesthesia: General | Site: Back

## 2023-11-10 MED ORDER — ONDANSETRON HCL 4 MG/2ML IJ SOLN: 4.0000 mg | Freq: Four times a day (QID) | INTRAMUSCULAR | Status: DC | PRN

## 2023-11-10 MED ORDER — LIDOCAINE 2% (20 MG/ML) 5 ML SYRINGE
INTRAMUSCULAR | Status: DC | PRN
  Administered 2023-11-10: 100 mg via INTRAVENOUS

## 2023-11-10 MED ORDER — FENTANYL CITRATE (PF) 100 MCG/2ML IJ SOLN
INTRAMUSCULAR | Status: AC
  Filled 2023-11-10: qty 2

## 2023-11-10 MED ORDER — PHENYLEPHRINE HCL-NACL 20-0.9 MG/250ML-% IV SOLN
INTRAVENOUS | Status: DC | PRN
  Administered 2023-11-10: 20 ug/min via INTRAVENOUS

## 2023-11-10 MED ORDER — DEXAMETHASONE SOD PHOSPHATE PF 10 MG/ML IJ SOLN
INTRAMUSCULAR | Status: DC | PRN
  Administered 2023-11-10: 10 mg via INTRAVENOUS

## 2023-11-10 MED ORDER — PROPOFOL 10 MG/ML IV BOLUS
INTRAVENOUS | Status: AC
Start: 2023-11-10 — End: 2023-11-10
  Filled 2023-11-10: qty 20

## 2023-11-10 MED ORDER — EZETIMIBE 10 MG PO TABS: 10.0000 mg | ORAL_TABLET | Freq: Every day | ORAL | Status: DC

## 2023-11-10 MED ORDER — ACETAMINOPHEN 10 MG/ML IV SOLN
1000.0000 mg | Freq: Once | INTRAVENOUS | Status: DC | PRN
  Administered 2023-11-10: 1000 mg via INTRAVENOUS

## 2023-11-10 MED ORDER — OXYCODONE HCL 5 MG/5ML PO SOLN: 5.0000 mg | Freq: Once | ORAL | Status: AC | PRN

## 2023-11-10 MED ORDER — EPHEDRINE 5 MG/ML INJ
INTRAVENOUS | Status: AC
Start: 2023-11-10 — End: 2023-11-10
  Filled 2023-11-10: qty 5

## 2023-11-10 MED ORDER — PROPOFOL 10 MG/ML IV BOLUS
INTRAVENOUS | Status: DC | PRN
  Administered 2023-11-10: 50 mg via INTRAVENOUS
  Administered 2023-11-10: 150 mg via INTRAVENOUS

## 2023-11-10 MED ORDER — ALUM & MAG HYDROXIDE-SIMETH 200-200-20 MG/5ML PO SUSP: 30.0000 mL | Freq: Four times a day (QID) | ORAL | Status: DC | PRN

## 2023-11-10 MED ORDER — LACTATED RINGERS IV SOLN
INTRAVENOUS | Status: DC
Start: 2023-11-10 — End: 2023-11-10

## 2023-11-10 MED ORDER — SODIUM CHLORIDE 0.9 % IV SOLN
250.0000 mL | INTRAVENOUS | Status: DC
  Administered 2023-11-10: 250 mL via INTRAVENOUS

## 2023-11-10 MED ORDER — PHENYLEPHRINE 80 MCG/ML (10ML) SYRINGE FOR IV PUSH (FOR BLOOD PRESSURE SUPPORT)
PREFILLED_SYRINGE | INTRAVENOUS | Status: DC | PRN
Start: 2023-11-10 — End: 2023-11-10
  Administered 2023-11-10: 80 ug via INTRAVENOUS

## 2023-11-10 MED ORDER — OXYCODONE HCL 5 MG PO TABS
5.0000 mg | ORAL_TABLET | Freq: Once | ORAL | Status: AC | PRN
  Administered 2023-11-10: 5 mg via ORAL

## 2023-11-10 MED ORDER — LIDOCAINE-EPINEPHRINE 1 %-1:100000 IJ SOLN
INTRAMUSCULAR | Status: AC
Start: 2023-11-10 — End: 2023-11-10
  Filled 2023-11-10: qty 1

## 2023-11-10 MED ORDER — FENOFIBRATE 54 MG PO TABS
108.0000 mg | ORAL_TABLET | Freq: Every day | ORAL | Status: DC
  Administered 2023-11-11: 108 mg via ORAL
  Filled 2023-11-10: qty 2

## 2023-11-10 MED ORDER — FLUCONAZOLE 150 MG PO TABS
150.0000 mg | ORAL_TABLET | Freq: Every day | ORAL | Status: DC
  Administered 2023-11-10: 150 mg via ORAL
  Filled 2023-11-10: qty 1

## 2023-11-10 MED ORDER — CYCLOBENZAPRINE HCL 10 MG PO TABS
10.0000 mg | ORAL_TABLET | Freq: Three times a day (TID) | ORAL | Status: DC | PRN
  Administered 2023-11-10: 10 mg via ORAL
  Filled 2023-11-10: qty 1

## 2023-11-10 MED ORDER — FENTANYL CITRATE (PF) 250 MCG/5ML IJ SOLN
INTRAMUSCULAR | Status: DC | PRN
  Administered 2023-11-10 (×4): 50 ug via INTRAVENOUS
  Administered 2023-11-10: 100 ug via INTRAVENOUS

## 2023-11-10 MED ORDER — PROPRANOLOL HCL 20 MG PO TABS
20.0000 mg | ORAL_TABLET | Freq: Every day | ORAL | Status: DC
  Administered 2023-11-10: 20 mg via ORAL
  Filled 2023-11-10: qty 1
  Filled 2023-11-10: qty 2

## 2023-11-10 MED ORDER — ESCITALOPRAM OXALATE 10 MG PO TABS
10.0000 mg | ORAL_TABLET | Freq: Every day | ORAL | Status: DC
  Administered 2023-11-10: 10 mg via ORAL
  Filled 2023-11-10: qty 1

## 2023-11-10 MED ORDER — CO Q-10 100 MG PO CAPS: 100.0000 mg | ORAL_CAPSULE | Freq: Every day | ORAL | Status: DC

## 2023-11-10 MED ORDER — ASPIRIN 81 MG PO TBEC: 81.0000 mg | DELAYED_RELEASE_TABLET | Freq: Every day | ORAL | Status: DC

## 2023-11-10 MED ORDER — TIRZEPATIDE 7.5 MG/0.5ML ~~LOC~~ SOAJ: 7.5000 mg | SUBCUTANEOUS | Status: DC

## 2023-11-10 MED ORDER — ONDANSETRON HCL 4 MG PO TABS: 4.0000 mg | ORAL_TABLET | Freq: Four times a day (QID) | ORAL | Status: DC | PRN

## 2023-11-10 MED ORDER — ACETAMINOPHEN 325 MG PO TABS: 650.0000 mg | ORAL_TABLET | ORAL | Status: DC | PRN

## 2023-11-10 MED ORDER — THROMBIN 20000 UNITS EX SOLR
CUTANEOUS | Status: DC | PRN
  Administered 2023-11-10: 20 mL via TOPICAL

## 2023-11-10 MED ORDER — PANTOPRAZOLE SODIUM 40 MG IV SOLR: 40.0000 mg | Freq: Every day | INTRAVENOUS | Status: DC

## 2023-11-10 MED ORDER — CHLORHEXIDINE GLUCONATE CLOTH 2 % EX PADS: 6.0000 | MEDICATED_PAD | Freq: Once | CUTANEOUS | Status: DC

## 2023-11-10 MED ORDER — ORAL CARE MOUTH RINSE: 15.0000 mL | Freq: Once | OROMUCOSAL | Status: AC

## 2023-11-10 MED ORDER — OXYCODONE HCL 5 MG PO TABS: 10.0000 mg | ORAL_TABLET | ORAL | Status: DC | PRN

## 2023-11-10 MED ORDER — CEFAZOLIN SODIUM-DEXTROSE 2-4 GM/100ML-% IV SOLN
2.0000 g | INTRAVENOUS | Status: AC
  Administered 2023-11-10: 2 g via INTRAVENOUS
  Filled 2023-11-10: qty 100

## 2023-11-10 MED ORDER — EPHEDRINE SULFATE-NACL 50-0.9 MG/10ML-% IV SOSY
PREFILLED_SYRINGE | INTRAVENOUS | Status: DC | PRN
  Administered 2023-11-10 (×2): 5 mg via INTRAVENOUS
  Administered 2023-11-10: 10 mg via INTRAVENOUS

## 2023-11-10 MED ORDER — ROCURONIUM BROMIDE 10 MG/ML (PF) SYRINGE
PREFILLED_SYRINGE | INTRAVENOUS | Status: AC
  Filled 2023-11-10: qty 10

## 2023-11-10 MED ORDER — 0.9 % SODIUM CHLORIDE (POUR BTL) OPTIME
TOPICAL | Status: DC | PRN
  Administered 2023-11-10: 1000 mL

## 2023-11-10 MED ORDER — LISINOPRIL 20 MG PO TABS
20.0000 mg | ORAL_TABLET | Freq: Every day | ORAL | Status: DC
  Administered 2023-11-10: 20 mg via ORAL
  Filled 2023-11-10: qty 1

## 2023-11-10 MED ORDER — HYDROMORPHONE HCL 1 MG/ML IJ SOLN
0.5000 mg | INTRAMUSCULAR | Status: DC | PRN
  Administered 2023-11-10: 0.5 mg via INTRAVENOUS
  Filled 2023-11-10: qty 0.5

## 2023-11-10 MED ORDER — LIDOCAINE-EPINEPHRINE 1 %-1:100000 IJ SOLN
INTRAMUSCULAR | Status: DC | PRN
  Administered 2023-11-10: 10 mL

## 2023-11-10 MED ORDER — HYDROCHLOROTHIAZIDE 12.5 MG PO TABS
12.5000 mg | ORAL_TABLET | Freq: Every day | ORAL | Status: DC
  Administered 2023-11-10: 12.5 mg via ORAL
  Filled 2023-11-10: qty 1

## 2023-11-10 MED ORDER — MENTHOL 3 MG MT LOZG: 1.0000 | LOZENGE | OROMUCOSAL | Status: DC | PRN

## 2023-11-10 MED ORDER — BUPIVACAINE LIPOSOME 1.3 % IJ SUSP
INTRAMUSCULAR | Status: DC | PRN
  Administered 2023-11-10: 20 mL

## 2023-11-10 MED ORDER — BUPIVACAINE HCL (PF) 0.25 % IJ SOLN
INTRAMUSCULAR | Status: AC
Start: 2023-11-10 — End: 2023-11-10
  Filled 2023-11-10: qty 30

## 2023-11-10 MED ORDER — FENTANYL CITRATE (PF) 100 MCG/2ML IJ SOLN
25.0000 ug | INTRAMUSCULAR | Status: DC | PRN
  Administered 2023-11-10 (×3): 50 ug via INTRAVENOUS

## 2023-11-10 MED ORDER — VITAMIN D 25 MCG (1000 UNIT) PO TABS: 5000.0000 [IU] | ORAL_TABLET | Freq: Every day | ORAL | Status: DC

## 2023-11-10 MED ORDER — LIDOCAINE 2% (20 MG/ML) 5 ML SYRINGE
INTRAMUSCULAR | Status: AC
  Filled 2023-11-10: qty 5

## 2023-11-10 MED ORDER — BUPIVACAINE LIPOSOME 1.3 % IJ SUSP
INTRAMUSCULAR | Status: AC
  Filled 2023-11-10: qty 20

## 2023-11-10 MED ORDER — ACETAMINOPHEN 650 MG RE SUPP: 650.0000 mg | RECTAL | Status: DC | PRN

## 2023-11-10 MED ORDER — PANTOPRAZOLE SODIUM 40 MG PO TBEC
40.0000 mg | DELAYED_RELEASE_TABLET | Freq: Every day | ORAL | Status: DC
  Administered 2023-11-10: 40 mg via ORAL
  Filled 2023-11-10: qty 1

## 2023-11-10 MED ORDER — DROPERIDOL 2.5 MG/ML IJ SOLN: 0.6250 mg | Freq: Once | INTRAMUSCULAR | Status: DC | PRN

## 2023-11-10 MED ORDER — SODIUM CHLORIDE 0.9% FLUSH
3.0000 mL | Freq: Two times a day (BID) | INTRAVENOUS | Status: DC
  Administered 2023-11-10 (×2): 3 mL via INTRAVENOUS

## 2023-11-10 MED ORDER — THROMBIN 20000 UNITS EX SOLR
CUTANEOUS | Status: AC
  Filled 2023-11-10: qty 20000

## 2023-11-10 MED ORDER — PHENOL 1.4 % MT LIQD: 1.0000 | OROMUCOSAL | Status: DC | PRN

## 2023-11-10 MED ORDER — FENTANYL CITRATE (PF) 250 MCG/5ML IJ SOLN
INTRAMUSCULAR | Status: AC
  Filled 2023-11-10: qty 5

## 2023-11-10 MED ORDER — ALBUMIN HUMAN 5 % IV SOLN: INTRAVENOUS | Status: DC | PRN

## 2023-11-10 MED ORDER — HYDROCODONE-ACETAMINOPHEN 10-325 MG PO TABS
1.0000 | ORAL_TABLET | ORAL | Status: DC | PRN
  Administered 2023-11-10: 2 via ORAL
  Administered 2023-11-10: 1 via ORAL
  Administered 2023-11-11: 2 via ORAL
  Filled 2023-11-10 (×2): qty 2
  Filled 2023-11-10: qty 1

## 2023-11-10 MED ORDER — OXYCODONE HCL 5 MG PO TABS
ORAL_TABLET | ORAL | Status: AC
  Filled 2023-11-10: qty 1

## 2023-11-10 MED ORDER — SIMVASTATIN 20 MG PO TABS
20.0000 mg | ORAL_TABLET | Freq: Every day | ORAL | Status: DC
Start: 2023-11-10 — End: 2023-11-11
  Administered 2023-11-10: 20 mg via ORAL
  Filled 2023-11-10: qty 1

## 2023-11-10 MED ORDER — ROCURONIUM BROMIDE 10 MG/ML (PF) SYRINGE
PREFILLED_SYRINGE | INTRAVENOUS | Status: DC | PRN
  Administered 2023-11-10: 60 mg via INTRAVENOUS
  Administered 2023-11-10: 40 mg via INTRAVENOUS

## 2023-11-10 MED ORDER — TRAZODONE HCL 50 MG PO TABS
50.0000 mg | ORAL_TABLET | Freq: Every day | ORAL | Status: DC
  Administered 2023-11-10: 50 mg via ORAL
  Filled 2023-11-10: qty 1

## 2023-11-10 MED ORDER — NITROFURANTOIN MONOHYD MACRO 100 MG PO CAPS
100.0000 mg | ORAL_CAPSULE | Freq: Every day | ORAL | Status: DC
Start: 2023-11-10 — End: 2023-11-11
  Administered 2023-11-10: 100 mg via ORAL
  Filled 2023-11-10: qty 1

## 2023-11-10 MED ORDER — ADULT MULTIVITAMIN W/MINERALS CH
1.0000 | ORAL_TABLET | Freq: Every day | ORAL | Status: DC
  Administered 2023-11-10: 1 via ORAL
  Filled 2023-11-10: qty 1

## 2023-11-10 MED ORDER — CHLORHEXIDINE GLUCONATE 0.12 % MT SOLN
15.0000 mL | Freq: Once | OROMUCOSAL | Status: AC
  Administered 2023-11-10: 15 mL via OROMUCOSAL
  Filled 2023-11-10: qty 15

## 2023-11-10 MED ORDER — ESTRADIOL 0.5 MG PO TABS
1.0000 mg | ORAL_TABLET | ORAL | Status: DC
  Filled 2023-11-10: qty 2

## 2023-11-10 MED ORDER — SUGAMMADEX SODIUM 200 MG/2ML IV SOLN
INTRAVENOUS | Status: DC | PRN
  Administered 2023-11-10: 200 mg via INTRAVENOUS

## 2023-11-10 MED ORDER — LISINOPRIL-HYDROCHLOROTHIAZIDE 20-12.5 MG PO TABS: 1.0000 | ORAL_TABLET | Freq: Every day | ORAL | Status: DC

## 2023-11-10 MED ORDER — SODIUM CHLORIDE 0.9% FLUSH: 3.0000 mL | INTRAVENOUS | Status: DC | PRN

## 2023-11-10 MED ORDER — CEFAZOLIN SODIUM-DEXTROSE 2-4 GM/100ML-% IV SOLN
2.0000 g | Freq: Three times a day (TID) | INTRAVENOUS | Status: DC
  Administered 2023-11-10 – 2023-11-11 (×3): 2 g via INTRAVENOUS
  Filled 2023-11-10 (×3): qty 100

## 2023-11-10 MED ORDER — ACETAMINOPHEN 500 MG PO TABS
1000.0000 mg | ORAL_TABLET | Freq: Once | ORAL | Status: AC
  Administered 2023-11-10: 1000 mg via ORAL
  Filled 2023-11-10: qty 2

## 2023-11-10 MED ORDER — ESTRADIOL 0.1 MG/GM VA CREA
1.0000 | TOPICAL_CREAM | VAGINAL | Status: DC
  Filled 2023-11-10: qty 42.5

## 2023-11-10 MED ORDER — ONDANSETRON HCL 4 MG/2ML IJ SOLN
INTRAMUSCULAR | Status: DC | PRN
  Administered 2023-11-10: 4 mg via INTRAVENOUS

## 2023-11-10 MED ORDER — ONDANSETRON HCL 4 MG/2ML IJ SOLN
INTRAMUSCULAR | Status: AC
  Filled 2023-11-10: qty 2

## 2023-11-10 SURGICAL SUPPLY — 61 items
BAG COUNTER SPONGE SURGICOUNT (BAG) ×1 IMPLANT
BENZOIN TINCTURE PRP APPL 2/3 (GAUZE/BANDAGES/DRESSINGS) ×1 IMPLANT
BLADE CLIPPER SURG (BLADE) IMPLANT
BLADE SURG 11 STRL SS (BLADE) ×1 IMPLANT
BUR MATCHSTICK NEURO 3.0 LAGG (BURR) ×1 IMPLANT
BUR PRECISION FLUTE 6.0 (BURR) ×1 IMPLANT
CANISTER SUCTION 3000ML PPV (SUCTIONS) ×1 IMPLANT
CAP LOCKING THREADED (Cap) IMPLANT
CNTNR URN SCR LID CUP LEK RST (MISCELLANEOUS) ×1 IMPLANT
COVER BACK TABLE 24X17X13 BIG (DRAPES) IMPLANT
COVER BACK TABLE 60X90IN (DRAPES) ×1 IMPLANT
DERMABOND ADVANCED .7 DNX12 (GAUZE/BANDAGES/DRESSINGS) ×1 IMPLANT
DRAPE C-ARM 42X72 X-RAY (DRAPES) ×2 IMPLANT
DRAPE HALF SHEET 40X57 (DRAPES) IMPLANT
DRAPE LAPAROTOMY 100X72X124 (DRAPES) ×1 IMPLANT
DRAPE POUCH INSTRU U-SHP 10X18 (DRAPES) ×1 IMPLANT
DRAPE SURG 17X23 STRL (DRAPES) ×1 IMPLANT
DRSG OPSITE POSTOP 4X8 (GAUZE/BANDAGES/DRESSINGS) IMPLANT
DURAPREP 26ML APPLICATOR (WOUND CARE) ×1 IMPLANT
ELECTRODE BLDE 4.0 EZ CLN MEGD (MISCELLANEOUS) IMPLANT
ELECTRODE REM PT RTRN 9FT ADLT (ELECTROSURGICAL) ×1 IMPLANT
EVACUATOR 1/8 PVC DRAIN (DRAIN) ×1 IMPLANT
GAUZE 4X4 16PLY ~~LOC~~+RFID DBL (SPONGE) IMPLANT
GAUZE SPONGE 4X4 12PLY STRL (GAUZE/BANDAGES/DRESSINGS) ×1 IMPLANT
GLOVE BIO SURGEON STRL SZ7 (GLOVE) IMPLANT
GLOVE BIO SURGEON STRL SZ8 (GLOVE) ×2 IMPLANT
GLOVE BIOGEL PI IND STRL 7.0 (GLOVE) IMPLANT
GLOVE ECLIPSE 7.5 STRL STRAW (GLOVE) IMPLANT
GLOVE INDICATOR 8.5 STRL (GLOVE) ×4 IMPLANT
GOWN STRL REUS W/ TWL LRG LVL3 (GOWN DISPOSABLE) IMPLANT
GOWN STRL REUS W/ TWL XL LVL3 (GOWN DISPOSABLE) ×2 IMPLANT
GOWN STRL REUS W/TWL 2XL LVL3 (GOWN DISPOSABLE) IMPLANT
GRAFT BN 10X1XDBM MAGNIFUSE (Bone Implant) IMPLANT
GRAFT BNE MATRIX VG FRMBL L 10 (Bone Implant) IMPLANT
KIT BASIN OR (CUSTOM PROCEDURE TRAY) ×1 IMPLANT
KIT INFUSE SMALL (Orthopedic Implant) IMPLANT
KIT TURNOVER KIT B (KITS) ×1 IMPLANT
NDL HYPO 21X1.5 SAFETY (NEEDLE) ×1 IMPLANT
NDL HYPO 25X1 1.5 SAFETY (NEEDLE) ×1 IMPLANT
NEEDLE HYPO 21X1.5 SAFETY (NEEDLE) ×1 IMPLANT
NEEDLE HYPO 25X1 1.5 SAFETY (NEEDLE) ×1 IMPLANT
PACK LAMINECTOMY NEURO (CUSTOM PROCEDURE TRAY) ×1 IMPLANT
PAD ARMBOARD POSITIONER FOAM (MISCELLANEOUS) ×3 IMPLANT
ROD CREO STRAIGHT 125MM (Rod) IMPLANT
ROD SPINE STR CREO 5.5X150 (Rod) IMPLANT
SCREW CORTICAL CREO 5.5-4.5X30 (Screw) IMPLANT
SCREW PA THRD CREO TULIP 5.5X4 (Head) IMPLANT
SHAFT CREO 30MM (Neuro Prosthesis/Implant) IMPLANT
SOLN 0.9% NACL POUR BTL 1000ML (IV SOLUTION) ×1 IMPLANT
SOLN STERILE WATER BTL 1000 ML (IV SOLUTION) ×1 IMPLANT
SPIKE FLUID TRANSFER (MISCELLANEOUS) ×1 IMPLANT
SPONGE SURGIFOAM ABS GEL 100 (HEMOSTASIS) ×1 IMPLANT
SPONGE T-LAP 4X18 ~~LOC~~+RFID (SPONGE) IMPLANT
STRIP CLOSURE SKIN 1/2X4 (GAUZE/BANDAGES/DRESSINGS) ×2 IMPLANT
SUT VIC AB 0 CT1 18XCR BRD8 (SUTURE) ×2 IMPLANT
SUT VIC AB 2-0 CT1 18 (SUTURE) ×1 IMPLANT
SUT VIC AB 4-0 PS2 27 (SUTURE) ×1 IMPLANT
SYR 20ML LL LF (SYRINGE) ×1 IMPLANT
TOWEL GREEN STERILE (TOWEL DISPOSABLE) ×1 IMPLANT
TOWEL GREEN STERILE FF (TOWEL DISPOSABLE) ×1 IMPLANT
TRAY FOLEY MTR SLVR 16FR STAT (SET/KITS/TRAYS/PACK) ×1 IMPLANT

## 2023-11-10 NOTE — Op Note (Signed)
 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  Postoperative diagnosis: Same.  Procedure: #1 decompressive lumbar laminectomy T11-12 with partial facetectomies and foraminotomies at T11-T12 nerve roots bilaterally.  2.  Cortical screw fixation T10-11 T11-12 T12-L1 tying into pre-existing construct from L1-L3 with removal of pre-existing knots and rods and retention pre-existing screws with 3 screws on the right at L1-L2-L3 and 2 on the left at L1-L2.  Utilizing the globus Creo and modular cortical screw set  3.  Posterolateral arthrodesis T10-11 T11-12 T12-L1 utilizing locally harvested autograft mixed with BMP and Vivigen.  Surgeon: Arley helling.  Assistant: Suzen Click.  Anesthesia: General.  EBL: 250.  HPI: 68 year old female progressive worsening back bilateral pain rating across her lower abdomen T11-T12 distribution and difficulty walking.  Workup revealed progressive degenerative disc disease T11-12 with severe spinal stenosis and cord compression at that level.  Due to the patient's progression of clinical syndrome imaging findings failed conservative treatment and the fact that she had pre-existing fusion from L1 down to sacrum I recommended decompressive laminectomy at T11-12 with extension of her fusion up to T10.  I extensively reviewed the risks and benefits of the operation with the patient as well as perioperative course expectations of outcome and alternatives to surgery and she understood and agreed to proceed forward.  Operative procedure: Patient was brought into the OR was induced under general esthesia positioned prone Wilson frame her back was prepped and draped in routine sterile fashion.  Roll incision was identified in the superior aspect of her old incision was marked and extended up and after infiltration of 10 cc lidocaine  with epi midline incision was made and Bovie electrocautery was used to take down the subcutaneous  tissue and subperiosteal dissection carried lamina of T10-T11-T12 and the old hardware construct at L1-L3.  At this point the uroscopy was draped and brought in the field under AP fluoroscopy identified the cortical screw entry points at T10-T11-T12 with medially 7 o'clock position and then utilizing AP fluoroscopy drill holes at T12 T11 T10 respectively probed him to tap them and placed cortical screws I utilized 6050 cortical screws at T11 and T12 and 5545's at T10 with a smaller tap at T10.  All screws appear to have excellent purchase I then confirmed position trajectory with lateral fluoroscopy as well.  Then per started perform the decompression working at the level of the T12 cortical screw remove the superior aspect T12 spinous process the entire T11 spinous process drilled down and thinned out the lamina marching along the gutters I then remove the lamina from the central compartment en bloc them to perform partial facetectomies and foraminotomies of the T10-11 and T12 nerve roots bilaterally.  At the End decompression was no further stenosis either centrally or foraminally.  Then was copes irrigated take as hemostasis was maintained I aggressively decorticated the facet joints left lamina pars at T10-11 T11-12 T12-L1 inspected the fusion below L1 was felt to be solid down to L2 I did carry some of the decortication bone graft down towards L2.  Then packed all the autograft Vivigen Magnifuse and BMP posterior laterally at T10-11 T11-12 T12-L1 assembled the heads advanced the screws contoured the rods and then anchored everything in place and final tightened.  I did remove the knots and rods of the pre-existing hardware but we reused those screws they all appeared to have good purchase and wear excellent position.  Then placed a medium Hemovac drain after Gelfoam was laid in top of the  thecal sac and closed the wound in layers with interrupted Vicryl injected Exparel  in the fascia and closed subcutaneous  tissue with interrupted Vicryl and a running 4-0 subcuticular.  Dermabond benzoin Steri-Strips and sterile dressing was applied patient to cover room in stable condition.  At the end the case final count sponge counts were correct.

## 2023-11-10 NOTE — Anesthesia Postprocedure Evaluation (Signed)
 Anesthesia Post Note  Patient: Lauren Briggs  Procedure(s) Performed: LAMINECTOMY WITH POSTERIOR LATERAL ARTHRODESIS LEVEL THORACIC TEN-THORACI ELEVEN - THORACIC ELEVEN-THORACIC TWELVE - THORACIC TWELVE-LUMBAR ONE with decompressive laminectomy THORACIC ELEVEN-TWELVE (Back)     Patient location during evaluation: PACU Anesthesia Type: General Level of consciousness: awake and alert Pain management: pain level controlled Vital Signs Assessment: post-procedure vital signs reviewed and stable Respiratory status: spontaneous breathing, nonlabored ventilation, respiratory function stable and patient connected to nasal cannula oxygen Cardiovascular status: blood pressure returned to baseline and stable Postop Assessment: no apparent nausea or vomiting Anesthetic complications: no   No notable events documented.  Last Vitals:  Vitals:   11/10/23 1245 11/10/23 1300  BP: 133/65 (!) 132/58  Pulse: 79 73  Resp: 10 14  Temp:  36.6 C  SpO2: 97% 100%    Last Pain:  Vitals:   11/10/23 1200  TempSrc:   PainSc: 8                  Thom JONELLE Peoples

## 2023-11-10 NOTE — H&P (Signed)
 Lauren Briggs is an 68 y.o. female.   Chief Complaint: Back pain HPI: 68 year old female progressive worsening back pain bilateral lower abdominal pain radiating across from her lower thoracic.  Workup has revealed cord compression progressive spinal stenosis at T11-12 and degenerative disc disease instability.  Due to her progression of clinical syndrome imaging findings failed conservative treatment previous surgeries we recommended extension of her fusion up to T10 with decompression at T11-12.  We extensively went over the risks and benefits of the operation with the patient as well as perioperative course expectations of outcome and alternatives to surgery and she understood and agreed to proceed forward.  Past Medical History:  Diagnosis Date   Acute cystitis with hematuria 01/20/2022   Allergy    Anxiety 01-15-21   Arthritis    Back pain    Complication of anesthesia    HX Back CHIPPED TOOTH S/P EXTUBATION    Complication of anesthesia    Pt received too much versed  during induction which led to ischemic stroke   Coronary artery disease    Diabetes mellitus without complication (HCC)    Family history of adverse reaction to anesthesia    Mother had N/V   Hyperlipidemia    Hypertension    Ischemic stroke (HCC)    occured during back surgery   Low back pain radiating to right leg    Mixed hypercholesterolemia and 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   Pre-diabetes    Prediabetes 11/10/2018   Reactive depression (situational) 03/11/2022   Shortness of breath dyspnea    Urinary urgency 04/10/2022    Past Surgical History:  Procedure Laterality Date   ANTERIOR LAT LUMBAR FUSION N/A 08/14/2018   Procedure: extreme lateral interbody fusion - Lumbar two-Lumbar three  cortical screws and removal pedicle screws Lumbar -Sacral one;  Surgeon: Onetha Kuba, MD;  Location: [facility] OR;  Service: Neurosurgery;  Laterality: N/A;   APPENDECTOMY  1977   CARPAL TUNNEL RELEASE     BIL    CATARACT  EXTRACTION W/ INTRAOCULAR LENS IMPLANT Bilateral    CESAREAN SECTION  1982   CLOSED REDUCTION ULNAR SHAFT     COLONOSCOPY     FOOT SURGERY Left 04/11/2022   FOOT SURGERY Left    Fusion   HARDWARE REMOVAL N/A 08/14/2018   Procedure: Removal pedicle screws Lumbar -Sacral one with cortical screw fixation lumbar two-three;  Surgeon: Onetha Kuba, MD;  Location: [facility] OR;  Service: Neurosurgery;  Laterality: N/A;   NASAL SINUS SURGERY  1994   radial head Right 01/14/2016   at [facility]   RADIAL HEAD ARTHROPLASTY Right 01/26/2016   Procedure: RIGHT RADIAL HEAD REPLACEMENT ARTHROPLASTY WITH LIGAMENT REPAIRS;  Surgeon: Donnice Robinsons, MD;  Location: [facility];  Service: Orthopedics;  Laterality: Right;  Right elbow raial head replacement with possible ligament repairs as needed    SPINE SURGERY  6 spine surgeries   Fusion L1-S1   SUPRACERVICAL ABDOMINAL HYSTERECTOMY  1985   at time of c-section   ULNAR NERVE REPAIR Right    UPPER GASTROINTESTINAL ENDOSCOPY      Family History  Problem Relation Age of Onset   Dementia Mother    Heart attack Mother    Hypertension Mother    Heart disease Mother    Hyperlipidemia Mother    Heart attack Father 54   Heart disease Father    Sudden death Father    Hyperlipidemia Father    Heart attack Maternal Grandmother 2   Stroke Maternal Grandmother    Colon  cancer Neg Hx    Colon polyps Neg Hx    Esophageal cancer Neg Hx    Stomach cancer Neg Hx    Rectal cancer Neg Hx    Bladder Cancer Neg Hx    Uterine cancer Neg Hx    Renal cancer Neg Hx    Social History:  reports that she quit smoking about 44 years ago. Her smoking use included cigarettes. She started smoking about 47 years ago. She quit smokeless tobacco use about 44 years ago. She reports current alcohol use. She reports that she does not use drugs.  Allergies:  Allergies  Allergen Reactions   Versed  [Midazolam ] Other (See Comments)    Stroke   Iodine Nausea And Vomiting     During a myelogram  During a myelogram     During a myelogram  During a myelogram     During a myelogram   During a myelogram   Iodinated Contrast Media Nausea And Vomiting and Other (See Comments)   Lipitor [Atorvastatin ] Other (See Comments)    headaches   Zocor  [Simvastatin ] Other (See Comments)    Reports leg cramps w/ doses over 20mg  daily.   Propoxyphene Hcl Palpitations    darvon     Medications Prior to Admission  Medication Sig Dispense Refill   Cholecalciferol (VITAMIN D3) 125 MCG (5000 UT) TABS Take 5,000 Units by mouth daily.     Coenzyme Q10 (CO Q-10) 100 MG CAPS Take 100 mg by mouth daily.     escitalopram  (LEXAPRO ) 10 MG tablet TAKE 1 TABLET(10 MG) BY MOUTH AT BEDTIME 90 tablet 3   estradiol  (ESTRACE ) 0.1 MG/GM vaginal cream Place 1 Applicatorful vaginally every Monday, Wednesday, and Friday.     estradiol  (ESTRACE ) 1 MG tablet Take 1 mg by mouth every other day.     fenofibrate  micronized (LOFIBRA) 134 MG capsule Take 1 capsule (134 mg total) by mouth daily before breakfast. 90 capsule 3   HYDROcodone -acetaminophen  (NORCO) 10-325 MG tablet Take 1 tablet by mouth every 4 (four) hours as needed for severe pain (pain score 7-10).     lisinopril -hydrochlorothiazide  (ZESTORETIC ) 20-12.5 MG tablet TAKE 1 TABLET BY MOUTH DAILY 90 tablet 3   meloxicam  (MOBIC ) 15 MG tablet TAKE 1 TABLET(15 MG) BY MOUTH DAILY 60 tablet 1   Multiple Vitamins-Minerals (CENTRUM SILVER 50+WOMEN) TABS Take 1 tablet by mouth daily.     nitrofurantoin , macrocrystal-monohydrate, (MACROBID ) 100 MG capsule TAKE 1 CAPSULE(100 MG) BY MOUTH AT BEDTIME. START AFTER COMPLETING FULL DOSE OF AUGMENTIN  30 capsule 6   propranolol  (INDERAL ) 20 MG tablet Take 1 tablet (20 mg total) by mouth 3 (three) times daily as needed. (Patient taking differently: Take 20 mg by mouth at bedtime.) 270 tablet 3   simvastatin  (ZOCOR ) 20 MG tablet TAKE 1 TABLET(20 MG) BY MOUTH DAILY AT 6 PM 90 tablet 3   tirzepatide (MOUNJARO)  7.5 MG/0.5ML Pen Inject 7.5 mg into the skin every Saturday.     traZODone  (DESYREL ) 50 MG tablet TAKE 1 TABLET(50 MG) BY MOUTH AT BEDTIME 90 tablet 3   aspirin  EC 81 MG tablet Take 1 tablet (81 mg total) by mouth daily. Swallow whole. 30 tablet 12   ezetimibe  (ZETIA ) 10 MG tablet TAKE 1 TABLET(10 MG) BY MOUTH DAILY (Patient not taking: Reported on 11/04/2023) 90 tablet 3   fluconazole  (DIFLUCAN ) 150 MG tablet Take 1 tablet at the first sign of yeast infection and 1 tablet three days later. (Patient not taking: No sig reported) 2 tablet 2  No results found for this or any previous visit (from the past 48 hours). No results found.  Review of Systems  Musculoskeletal:  Positive for back pain.  Neurological:  Positive for numbness.    Blood pressure (!) 173/84, pulse 69, temperature 98.8 F (37.1 C), temperature source Oral, resp. rate 20, height 5' 4 (1.626 m), weight 96.2 kg, SpO2 97%. Physical Exam HENT:     Head: Normocephalic.     Right Ear: Tympanic membrane normal.     Nose: Nose normal.  Cardiovascular:     Rate and Rhythm: Normal rate.     Pulses: Normal pulses.  Pulmonary:     Effort: Pulmonary effort is normal.  Musculoskeletal:        General: Normal range of motion.     Cervical back: Normal range of motion.  Skin:    General: Skin is warm.  Neurological:     Mental Status: She is alert.     Comments: Strength is 5 out of 5 iliopsoas, quads, hamstrings, gastrocs, tibialis, and EHL.      Assessment/Plan 68 year old presents for decompressive laminectomy and extension of fusion up to T10.  Arley SHAUNNA Helling, MD 11/10/2023, 7:22 AM

## 2023-11-10 NOTE — Transfer of Care (Signed)
 Immediate Anesthesia Transfer of Care Note  Patient: Lauren Briggs  Procedure(s) Performed: LAMINECTOMY WITH POSTERIOR LATERAL ARTHRODESIS LEVEL THORACIC TEN-THORACI ELEVEN - THORACIC ELEVEN-THORACIC TWELVE - THORACIC TWELVE-LUMBAR ONE with decompressive laminectomy THORACIC ELEVEN-TWELVE (Back)  Patient Location: PACU  Anesthesia Type:General  Level of Consciousness: awake, oriented, and patient cooperative  Airway & Oxygen Therapy: Patient Spontanous Breathing  Post-op Assessment: Report given to RN and Post -op Vital signs reviewed and stable  Post vital signs: Reviewed and stable  Last Vitals:  Vitals Value Taken Time  BP 165/74 11/10/23 11:12  Temp    Pulse 86 11/10/23 11:15  Resp 17 11/10/23 11:15  SpO2 97 % 11/10/23 11:15  Vitals shown include unfiled device data.  Last Pain:  Vitals:   11/10/23 0603  TempSrc: Oral  PainSc:       Patients Stated Pain Goal: 6 (11/10/23 0559)  Complications: No notable events documented.

## 2023-11-10 NOTE — Plan of Care (Signed)

## 2023-11-10 NOTE — Anesthesia Procedure Notes (Signed)
 Arterial Line Insertion Start/End10/20/2025 7:00 AM, 11/10/2023 7:10 AM Performed by: Zelphia Norleen HERO, CRNA, CRNA  Patient location: Pre-op. Preanesthetic checklist: patient identified, IV checked, site marked, risks and benefits discussed, surgical consent, monitors and equipment checked and pre-op evaluation Lidocaine  1% used for infiltration Left, radial was placed Catheter size: 20 G Hand hygiene performed  and maximum sterile barriers used   Attempts: 1 Procedure performed using ultrasound to evaluate access site. Ultrasound Notes:relevant anatomy identified, ultrasound used to visualize needle entry and vessel patent under ultrasound. Following insertion, dressing applied. Post procedure assessment: normal  Patient tolerated the procedure well with no immediate complications.

## 2023-11-10 NOTE — Anesthesia Procedure Notes (Signed)
 Procedure Name: Intubation Date/Time: 11/10/2023 7:50 AM  Performed by: Genny Gun, CRNAPre-anesthesia Checklist: Patient identified, Emergency Drugs available, Suction available, Patient being monitored and Timeout performed Patient Re-evaluated:Patient Re-evaluated prior to induction Oxygen Delivery Method: Circle system utilized Preoxygenation: Pre-oxygenation with 100% oxygen Induction Type: IV induction Ventilation: Mask ventilation without difficulty Laryngoscope Size: Glidescope and 3 Grade View: Grade I Tube type: Oral Rae Tube size: 7.0 mm Number of attempts: 1 Placement Confirmation: ETT inserted through vocal cords under direct vision, positive ETCO2 and breath sounds checked- equal and bilateral Secured at: 22 cm Tube secured with: Tape Dental Injury: Teeth and Oropharynx as per pre-operative assessment  Comments: 2 handed mask easy to ventilate

## 2023-11-11 MED ORDER — HYDROCODONE-ACETAMINOPHEN 10-325 MG PO TABS: 1.0000 | ORAL_TABLET | ORAL | 0 refills | Status: DC | PRN

## 2023-11-11 MED ORDER — CYCLOBENZAPRINE HCL 10 MG PO TABS: 10.0000 mg | ORAL_TABLET | Freq: Three times a day (TID) | ORAL | 0 refills | Status: AC | PRN

## 2023-11-11 NOTE — Progress Notes (Signed)
 Patient alert and oriented, mae's well, voiding adequate amount of urine, swallowing without difficulty, no c/o pain at time of discharge. Patient discharged home with family. Script and discharged instructions given to patient. Patient and family stated understanding of instructions given. Patient has an appointment with Dr. Onetha In 2 weeks. Patient waiting for family for ride home

## 2023-11-11 NOTE — Discharge Summary (Signed)
 Physician Discharge Summary  Patient ID: Lauren Briggs MRN: [id number] DOB/AGE: 68/26/1957 68 y.o.  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    Discharge Diagnoses: same   Discharged Condition: good  Hospital Course: The patient was admitted on 11/10/2023 and taken to the operating room where the patient underwent laminectomy T11-T12 with extension of posterior lateral fusion T10-L1. The patient tolerated the procedure well and was taken to the recovery room and then to the floor in stable condition. The hospital course was routine. There were no complications. The wound remained clean dry and intact. Pt had appropriate back soreness. No complaints of leg pain or new N/T/W. The patient remained afebrile with stable vital signs, and tolerated a regular diet. The patient continued to increase activities, and pain was well controlled with oral pain medications.   Consults: None  Significant Diagnostic Studies:  Results for orders placed or performed during the hospital encounter of 11/10/23  Glucose, capillary   Collection Time: 11/10/23 11:14 AM  Result Value Ref Range   Glucose-Capillary 136 (H) 70 - 99 mg/dL  Glucose, capillary   Collection Time: 11/10/23  3:27 PM  Result Value Ref Range   Glucose-Capillary 188 (H) 70 - 99 mg/dL    DG Lumbar Spine 2-3 Views Result Date: 11/10/2023 CLINICAL DATA:  Elective surgery. EXAM: LUMBAR SPINE - 2-3 VIEW COMPARISON:  Preoperative imaging FINDINGS: Three fluoroscopic spot views of the lower thoracic and upper lumbar spine submitted from the operating room. Pedicle screws at multiple levels, levels difficult to delineate due to coned fluoroscopic views. Fluoroscopy time 2 minutes 8 seconds. Dose 112.85 mGy. IMPRESSION: Intraoperative fluoroscopy during spine surgery. Electronically Signed   By: Andrea Gasman M.D.   On: 11/10/2023 11:19   DG  C-Arm 1-60 Min-No Report Result Date: 11/10/2023 Fluoroscopy was utilized by the requesting physician.  No radiographic interpretation.    Antibiotics:  Anti-infectives (From admission, onward)    Start     Dose/Rate Route Frequency Ordered Stop   11/10/23 2200  fluconazole  (DIFLUCAN ) tablet 150 mg       Note to Pharmacy: Take 1 tablet at the first sign of yeast infection and 1 tablet three days later.     150 mg Oral Daily at bedtime 11/10/23 1334     11/10/23 2200  nitrofurantoin  (macrocrystal-monohydrate) (MACROBID ) capsule 100 mg        100 mg Oral Daily at bedtime 11/10/23 1335     11/10/23 1345  ceFAZolin  (ANCEF ) IVPB 2g/100 mL premix        2 g 200 mL/hr over 30 Minutes Intravenous Every 8 hours 11/10/23 1334 11/12/23 1344   11/10/23 0600  ceFAZolin  (ANCEF ) IVPB 2g/100 mL premix        2 g 200 mL/hr over 30 Minutes Intravenous On call to O.R. 11/10/23 9461 11/10/23 0802       Discharge Exam: Blood pressure (!) 118/57, pulse 65, temperature 98 F (36.7 C), temperature source Oral, resp. rate 18, height 5' 4 (1.626 m), weight 96.2 kg, SpO2 97%. Neurologic: Grossly normal Ambulating and voiding well incision cdi   Discharge Medications:   Allergies as of 11/11/2023       Reactions   Versed  [midazolam ] Other (See Comments)   Stroke   Iodine Nausea And Vomiting   During a myelogram During a myelogram     During a myelogram During a myelogram     During a myelogram  During a myelogram   Iodinated Contrast Media Nausea And Vomiting, Other (See Comments)   Lipitor [atorvastatin ] Other (See Comments)   headaches   Zocor  [simvastatin ] Other (See Comments)   Reports leg cramps w/ doses over 20mg  daily.   Propoxyphene Hcl Palpitations   darvon        Medication List     TAKE these medications    aspirin  EC 81 MG tablet Take 1 tablet (81 mg total) by mouth daily. Swallow whole.   Centrum Silver 50+Women Tabs Take 1 tablet by mouth daily.   Co Q-10 100 MG  Caps Take 100 mg by mouth daily.   cyclobenzaprine  10 MG tablet Commonly known as: FLEXERIL  Take 1 tablet (10 mg total) by mouth 3 (three) times daily as needed for muscle spasms.   escitalopram  10 MG tablet Commonly known as: LEXAPRO  TAKE 1 TABLET(10 MG) BY MOUTH AT BEDTIME   estradiol  0.1 MG/GM vaginal cream Commonly known as: ESTRACE  Place 1 Applicatorful vaginally every Monday, Wednesday, and Friday.   estradiol  1 MG tablet Commonly known as: ESTRACE  Take 1 mg by mouth every other day.   ezetimibe  10 MG tablet Commonly known as: ZETIA  TAKE 1 TABLET(10 MG) BY MOUTH DAILY   fenofibrate  micronized 134 MG capsule Commonly known as: LOFIBRA Take 1 capsule (134 mg total) by mouth daily before breakfast.   fluconazole  150 MG tablet Commonly known as: DIFLUCAN  Take 1 tablet at the first sign of yeast infection and 1 tablet three days later.   HYDROcodone -acetaminophen  10-325 MG tablet Commonly known as: NORCO Take 1 tablet by mouth every 4 (four) hours as needed for severe pain (pain score 7-10).   lisinopril -hydrochlorothiazide  20-12.5 MG tablet Commonly known as: ZESTORETIC  TAKE 1 TABLET BY MOUTH DAILY   meloxicam  15 MG tablet Commonly known as: MOBIC  TAKE 1 TABLET(15 MG) BY MOUTH DAILY   nitrofurantoin  (macrocrystal-monohydrate) 100 MG capsule Commonly known as: MACROBID  TAKE 1 CAPSULE(100 MG) BY MOUTH AT BEDTIME. START AFTER COMPLETING FULL DOSE OF AUGMENTIN    propranolol  20 MG tablet Commonly known as: INDERAL  Take 1 tablet (20 mg total) by mouth 3 (three) times daily as needed. What changed: when to take this   simvastatin  20 MG tablet Commonly known as: ZOCOR  TAKE 1 TABLET(20 MG) BY MOUTH DAILY AT 6 PM   tirzepatide 7.5 MG/0.5ML Pen Commonly known as: MOUNJARO Inject 7.5 mg into the skin every Saturday.   traZODone  50 MG tablet Commonly known as: DESYREL  TAKE 1 TABLET(50 MG) BY MOUTH AT BEDTIME   Vitamin D3 125 MCG (5000 UT) Tabs Take 5,000 Units by  mouth daily.        Disposition: home   Final Dx: laminectomy T11-T12 with extension of fusion T10-L1  Discharge Instructions      Remove dressing in 72 hours   Complete by: As directed    Call MD for:   Complete by: As directed    Call MD for:  difficulty breathing, headache or visual disturbances   Complete by: As directed    Call MD for:  hives   Complete by: As directed    Call MD for:  persistant dizziness or light-headedness   Complete by: As directed    Call MD for:  persistant nausea and vomiting   Complete by: As directed    Call MD for:  redness, tenderness, or signs of infection (pain, swelling, redness, odor or green/yellow discharge around incision site)   Complete by: As directed    Call MD for:  severe uncontrolled pain   Complete by: As directed    Call MD for:  temperature >100.4   Complete by: As directed    Diet - low sodium heart healthy   Complete by: As directed    Driving Restrictions   Complete by: As directed    No driving for 2 weeks, no riding in the car for 1 week   Increase activity slowly   Complete by: As directed    Lifting restrictions   Complete by: As directed    No lifting more than 8 lbs          Signed: Suzen Lacks Ezmae Speers 11/11/2023, 7:44 AM

## 2023-11-11 NOTE — Evaluation (Signed)
 Occupational Therapy Evaluation Patient Details Name: Lauren Briggs MRN: [id number] DOB: April 28, 1955 Today's Date: 11/11/2023   History of Present Illness   Lauren Briggs is a 68 yo female who is s/p laminectomy with posterior lateral arthrodesis T10-L1 10/20. PMH includes multiple orthopedic surgeries, HLD, HTN, neuromuscular disorder, prediabetes, arthritis.     Clinical Impressions Lauren Briggs was evaluated s/p the above spine surgery. She is indep and lives with her husband at baseline. Upon evaluation pt was limited by surgical pain, spinal precautions, compensatory techniques and decreased activity tolerance. Overall she demonstrated mod I ability to complete mobility and ADLs without DME. Provided cues and education on spinal precautions and compensatory techniques throughout, handout provided and pt demonstrated great recall during ADLs and mobility. Pt does not require further acute OT services. Recommend d/c home with support of family.       If plan is discharge home, recommend the following:   Assistance with cooking/housework;Assist for transportation     Functional Status Assessment   Patient has had a recent decline in their functional status and demonstrates the ability to make significant improvements in function in a reasonable and predictable amount of time.     Equipment Recommendations   None recommended by OT     Recommendations for Other Services         Precautions/Restrictions   Precautions Precautions: Fall;Back Precaution Booklet Issued: Yes (comment) Required Braces or Orthoses:  (ne brace needed, pt came with a brace from home) Restrictions Weight Bearing Restrictions Per Provider Order: No     Mobility Bed Mobility Overal bed mobility: Needs Assistance Bed Mobility: Rolling, Sidelying to Sit Rolling: Modified independent (Device/Increase time) Sidelying to sit: Modified independent (Device/Increase time)             Transfers Overall transfer level: Modified independent                 General transfer comment: no AD      Balance Overall balance assessment: No apparent balance deficits (not formally assessed)             ADL either performed or assessed with clinical judgement   ADL Overall ADL's : Modified independent         General ADL Comments: pt demonstrated mod I ability to complete after review of spinal precuations and compensatory techniques, no DME needed     Vision Baseline Vision/History: 1 Wears glasses Vision Assessment?: No apparent visual deficits     Perception Perception: Within Functional Limits       Praxis Praxis: WFL       Pertinent Vitals/Pain Pain Assessment Pain Assessment: Faces Faces Pain Scale: Hurts little more Pain Location: surgical site Pain Descriptors / Indicators: Discomfort, Grimacing, Guarding Pain Intervention(s): Limited activity within patient's tolerance, Monitored during session     Extremity/Trunk Assessment Upper Extremity Assessment Upper Extremity Assessment: Overall WFL for tasks assessed   Lower Extremity Assessment Lower Extremity Assessment: Overall WFL for tasks assessed   Cervical / Trunk Assessment Cervical / Trunk Assessment: Back Surgery   Communication Communication Communication: No apparent difficulties   Cognition Arousal: Alert Behavior During Therapy: WFL for tasks assessed/performed Cognition: No apparent impairments                               Following commands: Intact       Cueing  General Comments   Cueing Techniques: Verbal cues  VSS on RA, husband present  Exercises     Shoulder Instructions      Home Living Family/patient expects to be discharged to:: Private residence Living Arrangements: Spouse/significant other Available Help at Discharge: Family;Available 24 hours/day Type of Home: House Home Access: Stairs to enter [employer] of  Steps: 4 Entrance Stairs-Rails: Right;Left Home Layout: Two level;Able to live on main level with bedroom/bathroom Alternate Level Stairs-Number of Steps: flight   Bathroom Shower/Tub: Chief Strategy Officer: Standard     Home Equipment: [employer] - single point          Prior Functioning/Environment Prior Level of Function : Independent/Modified Independent            OT Problem List: Decreased activity tolerance        OT Goals(Current goals can be found in the care plan section)   Acute Rehab OT Goals Patient Stated Goal: home OT Goal Formulation: With patient Time For Goal Achievement: 11/25/23 Potential to Achieve Goals: Good   AM-PAC OT 6 Clicks Daily Activity     Outcome Measure Help from another person eating meals?: None Help from another person taking care of personal grooming?: None Help from another person toileting, which includes using toliet, bedpan, or urinal?: None Help from another person bathing (including washing, rinsing, drying)?: None Help from another person to put on and taking off regular upper body clothing?: None Help from another person to put on and taking off regular lower body clothing?: None 6 Click Score: 24   End of Session Nurse Communication: Mobility status  Activity Tolerance: Patient tolerated treatment well Patient left: in bed;with family/visitor present  OT Visit Diagnosis: Pain                Time: 9155-9097 OT Time Calculation (min): 18 min Charges:  OT General Charges $OT Visit: 1 Visit OT Evaluation $OT Eval Moderate Complexity: 1 Mod  Lucie Kendall, OTR/L Acute Rehabilitation Services Office [phone] Secure Chat Communication Preferred   Lucie JONETTA Kendall 11/11/2023, 9:33 AM

## 2023-11-12 ENCOUNTER — Encounter (HOSPITAL_COMMUNITY): Payer: Self-pay | Admitting: Neurosurgery

## 2023-11-12 ENCOUNTER — Telehealth: Payer: Self-pay

## 2023-11-12 NOTE — Transitions of Care (Post Inpatient/ED Visit) (Signed)
 11/12/2023  Name: Lauren Briggs MRN: [id number] DOB: [birth year]  Today's TOC FU Call Status: Today's TOC FU Call Status:: Successful TOC FU Call Completed TOC FU Call Complete Date: 11/12/23 Patient's Name and Date of Birth confirmed.  Transition Care Management Follow-up Telephone Call Date of Discharge: 11/11/23 Discharge Facility: [facility]) Type of Discharge: Inpatient Admission Primary Inpatient Discharge Diagnosis:: Fusion T10-L1 How have you been since you were released from the hospital?: Same Any questions or concerns?: No  Items Reviewed: Did you receive and understand the discharge instructions provided?: Yes Medications obtained,verified, and reconciled?: Yes (Medications Reviewed) Any new allergies since your discharge?: No Dietary orders reviewed?: NA Do you have support at home?: Yes People in Home [RPT]: spouse Name of Support/Comfort Primary Source: Francis Molt  Medications Reviewed Today: Medications Reviewed Today     Reviewed by Moises Reusing, RN (Case Manager) on 11/12/23 at 1234  Med List Status: <None>   Medication Order Taking? Sig Documenting Provider Last Dose Status Informant  aspirin  EC 81 MG tablet [id number] No Take 1 tablet (81 mg total) by mouth daily. Swallow whole. Rosemarie Eather RAMAN, MD 10/31/2023 Active Self           Med Note SOILA, LYLE BROCKS   Tue Nov 04, 2023 10:42 AM) ON HOLD  Cholecalciferol (VITAMIN D3) 125 MCG (5000 UT) TABS [id number] No Take 5,000 Units by mouth daily. [provider] Past Week Active Self  Coenzyme Q10 (CO Q-10) 100 MG CAPS [id number] No Take 100 mg by mouth daily. [provider] Past Week Active Self  cyclobenzaprine  (FLEXERIL ) 10 MG tablet [id number]  Take 1 tablet (10 mg total) by mouth 3 (three) times daily as needed for muscle spasms. Meyran, Kimberly Hannah, NP  Active   escitalopram  (LEXAPRO ) 10 MG tablet [id number] No TAKE 1 TABLET(10 MG) BY MOUTH AT BEDTIME Early, Sara E, NP 11/09/2023  Active Self  estradiol  (ESTRACE ) 0.1 MG/GM vaginal cream [id number] No Place 1 Applicatorful vaginally every Monday, Wednesday, and Friday. [provider] Past Week Active Self  estradiol  (ESTRACE ) 1 MG tablet [id number] No Take 1 mg by mouth every other day. [provider] Past Week Active Self           Med Note FELIPE, MELISSA B   Wed Aug 05, 2018 12:10 PM)    ezetimibe  (ZETIA ) 10 MG tablet [id number] No TAKE 1 TABLET(10 MG) BY MOUTH DAILY  Patient not taking: Reported on 11/04/2023   Oris Camie BRAVO, NP Not Taking Active Self  fenofibrate  micronized (LOFIBRA) 134 MG capsule [id number] No Take 1 capsule (134 mg total) by mouth daily before breakfast. Early, Sara E, NP 11/09/2023 Active Self  fluconazole  (DIFLUCAN ) 150 MG tablet [id number]  Take 1 tablet at the first sign of yeast infection and 1 tablet three days later.  Patient not taking: No sig reported   Early, Sara E, NP  Active Self           Med Note DELILA, ADAM D   Thu May 22, 2023  1:33 PM) prn  HYDROcodone -acetaminophen  (NORCO) 10-325 MG tablet [id number]  Take 1 tablet by mouth every 4 (four) hours as needed for severe pain (pain score 7-10). Meyran, Kimberly Hannah, NP  Active   lisinopril -hydrochlorothiazide  (ZESTORETIC ) 20-12.5 MG tablet [id number] No TAKE 1 TABLET BY MOUTH DAILY Early, Sara E, NP 11/09/2023 Active Self  meloxicam  (MOBIC ) 15 MG tablet [id number] No TAKE 1 TABLET(15 MG) BY MOUTH DAILY Janit Thresa HERO, DPM Past Week Active Self  Multiple Vitamins-Minerals (CENTRUM SILVER 50+WOMEN) TABS [id number] No Take 1 tablet by mouth daily. [provider] Past Week Active Self  nitrofurantoin , macrocrystal-monohydrate, (MACROBID ) 100 MG capsule [id number] No TAKE 1 CAPSULE(100 MG) BY MOUTH AT BEDTIME. START AFTER COMPLETING FULL DOSE OF AUGMENTIN  Early, Sara E, NP Past Week Active Self  propranolol  (INDERAL ) 20 MG tablet [id number] No Take 1 tablet (20 mg total) by mouth 3 (three) times daily as needed.  Patient  taking differently: Take 20 mg by mouth at bedtime.   Early, Sara E, NP 11/09/2023 Active Self  simvastatin  (ZOCOR ) 20 MG tablet [id number] No TAKE 1 TABLET(20 MG) BY MOUTH DAILY AT 6 PM Early, Sara E, NP 11/09/2023 Active Self  tirzepatide (MOUNJARO) 7.5 MG/0.5ML Pen [id number] No Inject 7.5 mg into the skin every Saturday. [provider] Past Week Active Self           Med Note SOILA LYLE BROCKS   Tue Nov 04, 2023 10:42 AM) ON HOLD  traZODone  (DESYREL ) 50 MG tablet [id number] No TAKE 1 TABLET(50 MG) BY MOUTH AT BEDTIME Early, Camie BRAVO, NP 11/09/2023 Active Self            Home Care and Equipment/Supplies: Were Home Health Services Ordered?: No Any new equipment or medical supplies ordered?: No  Functional Questionnaire: Do you need assistance with bathing/showering or dressing?: No Do you need assistance with meal preparation?: No Do you need assistance with eating?: No Do you have difficulty maintaining continence: No Do you need assistance with getting out of bed/getting out of a chair/moving?: No Do you have difficulty managing or taking your medications?: No  Follow up appointments reviewed: PCP Follow-up appointment confirmed?: Yes Date of PCP follow-up appointment?: 12/02/23 Follow-up Provider: Lauraine Oris Driscilla Lionel Follow-up appointment confirmed?: No Reason Specialist Follow-Up Not Confirmed: Patient has Specialist Provider Number and will Call for Appointment Do you need transportation to your follow-up appointment?: No Do you understand care options if your condition(s) worsen?: Yes-patient verbalized understanding  SDOH Interventions Today    Flowsheet Row Most Recent Value  SDOH Interventions   Food Insecurity Interventions Intervention Not Indicated  Housing Interventions Intervention Not Indicated  Transportation Interventions Intervention Not Indicated  Utilities Interventions Intervention Not Indicated    Medford Balboa, BSN, RN [facility]  VBCI - Population Health RN Care Manager [phone]

## 2023-12-01 NOTE — Progress Notes (Signed)
 Covid: no Mammo: scheduled this Thursday, see's obgyn in Dec.    Lauren Doing, DNP, AGNP-c [facility] [address] [place], [place] [address] Main Office [phone] VISIT TYPE: CPE on 12/02/2023 Today's Vitals   12/02/23 1425  BP: 124/78  Pulse: 68  Weight: 212 lb (96.2 kg)  Height: 5' 2.5 (1.588 m)   Body mass index is 38.16 kg/m. BP 124/78   Pulse 68   Ht 5' 2.5 (1.588 m)   Wt 212 lb (96.2 kg)   BMI 38.16 kg/m   Subjective:    Patient ID: Lauren Briggs, female    DOB: 02/03/1955, 68 y.o.   MRN: [id number]  HPI:  History of Present Illness Lauren Briggs is a 68 year old female who presents for her annual exam.   She underwent back surgery on November 10, 2023, for fusion from T10 to S1. She has experienced improvement in pain, particularly the pain that previously radiated across her shoulders and up her neck. The recovery process is lengthy, and she is required to wear a brace for at least three months. Initially, she experienced pain across her shoulders without leg pain, leading to a series of diagnostic tests including x-rays, MRIs, and a CT scan, which eventually identified the issue.  Prior to surgery, she underwent dry needling and nerve block injections, which were ineffective. The surgery was delayed due to her surgeon's vacation but was eventually performed three weeks ago. Post-operatively, she reports her ear was feeling funny this morning and she has a sore throat with occasional coughing and bleeding.  She is currently taking hydrocodone , which affects her bowel habits, but she manages it effectively. She is also on Mounjaro  7.5 mg, which has helped her lose weight from 231 lbs in May to 212 lbs currently. She experiences reduced appetite and nausea with the medication but has found an anti-nausea remedy that works well for her.  She attended a concert two weeks post-surgery, which left her exhausted, but she managed with assistance from  friends and family.   Pertinent items are noted in HPI.  Most Recent Depression Screen:     12/02/2023    2:23 PM 09/25/2023    9:05 AM 05/20/2023   11:05 AM 11/14/2022   11:13 AM 05/14/2022   10:28 AM  Depression screen PHQ 2/9  Decreased Interest 0 0 0 0 0  Down, Depressed, Hopeless 0 0 0 0 0  PHQ - 2 Score 0 0 0 0 0  Altered sleeping 0  0 0 0  Tired, decreased energy 1  1 0 0  Change in appetite 0  0 0 0  Feeling bad or failure about yourself  0  0 0 0  Trouble concentrating 0  0 0 0  Moving slowly or fidgety/restless 0  0 0 0  Suicidal thoughts 0  0 0 0  PHQ-9 Score 1  1  0  0   Difficult Briggs work/chores Somewhat difficult  Somewhat difficult Not difficult at all Not difficult at all     Data saved with a previous flowsheet row definition   Most Recent Anxiety Screen:     12/04/2021    1:57 PM 03/25/2021    9:46 PM  GAD 7 : Generalized Anxiety Score  Nervous, Anxious, on Edge 0 3  Control/stop worrying 0 3  Worry too much - different things 0 3  Trouble relaxing 0 3  Restless 0 2  Easily annoyed or irritable 0 3  Afraid -  awful might happen 0   Total GAD 7 Score 0   Anxiety Difficulty  Very difficult   Most Recent Fall Screen:    12/02/2023    2:22 PM 09/25/2023    9:05 AM 05/18/2023    8:57 AM 11/14/2022   11:12 AM 05/11/2022    9:57 AM  Fall Risk   Falls in the past year? 0 0 0 0 0  Number falls in past yr: 0 0 0 0   Injury with Fall? 0 0 0 0 0  Risk for fall due to : No Fall Risks No Fall Risks Medication side effect No Fall Risks Medication side effect;Impaired mobility;Impaired balance/gait  Follow up Falls evaluation completed Falls evaluation completed Falls prevention discussed;Falls evaluation completed Falls evaluation completed Falls prevention discussed;Education provided;Falls evaluation completed    Past medical history, surgical history, medications, allergies, family history and social history reviewed with patient today and changes made to  appropriate areas of the chart.  Past Medical History:  Past Medical History:  Diagnosis Date   Acute cystitis with hematuria 01/20/2022   Allergy    Anxiety 01-15-21   Arthritis    Back pain    Complication of anesthesia    HX Back CHIPPED TOOTH S/P EXTUBATION    Complication of anesthesia    Pt received too much versed  during induction which led to ischemic stroke   Coronary artery disease    Diabetes mellitus without complication (HCC)    Family history of adverse reaction to anesthesia    Mother had N/V   Hyperlipidemia    Hypertension    Ischemic stroke (HCC)    occured during back surgery   Low back pain radiating to right leg    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 Swallow whole.   Cholecalciferol (VITAMIN D3) 125 MCG (5000 UT) TABS Take 5,000 Units by mouth daily.   Coenzyme Q10 (CO Q-10) 100 MG CAPS Take 100 mg by mouth daily.   cyclobenzaprine  (FLEXERIL ) 10 MG tablet Take 1 tablet (10 mg total) by mouth 3 (three) times daily as needed for muscle spasms.   escitalopram  (LEXAPRO ) 10 MG tablet TAKE 1 TABLET(10 MG) BY MOUTH AT BEDTIME   estradiol  (ESTRACE ) 0.1 MG/GM vaginal cream Place 1 Applicatorful vaginally every Monday, Wednesday, and Friday.   estradiol  (ESTRACE ) 1 MG tablet Take 1 mg by mouth every other day.   fenofibrate  micronized (LOFIBRA) 134 MG capsule Take 1 capsule (134 mg total) by mouth daily before breakfast.   HYDROcodone -acetaminophen  (NORCO/VICODIN) 5-325 MG tablet take 2 tablet by oral route  every4- 6 hours as needed for pain   lisinopril -hydrochlorothiazide  (ZESTORETIC ) 20-12.5 MG tablet TAKE 1 TABLET BY MOUTH DAILY    meloxicam  (MOBIC ) 15 MG tablet TAKE 1 TABLET(15 MG) BY MOUTH DAILY   Multiple Vitamins-Minerals (CENTRUM SILVER 50+WOMEN) TABS Take 1 tablet by mouth daily.   propranolol  (INDERAL ) 20 MG tablet Take 1 tablet (20 mg total) by mouth 3 (three) times daily as needed.   simvastatin  (ZOCOR ) 20 MG tablet TAKE 1 TABLET(20 MG) BY MOUTH DAILY AT 6 PM   tirzepatide  (MOUNJARO ) 7.5 MG/0.5ML Pen Inject 7.5 mg into the skin every Saturday.  traZODone  (DESYREL ) 50 MG tablet TAKE 1 TABLET(50 MG) BY MOUTH AT BEDTIME   ezetimibe  (ZETIA ) 10 MG tablet TAKE 1 TABLET(10 MG) BY MOUTH DAILY (Patient not taking: Reported on 12/03/2023)   fluconazole  (DIFLUCAN ) 150 MG tablet Take 1 tablet at the first sign of yeast infection and 1 tablet three days later. (Patient not taking: Reported on 12/03/2023)   nitrofurantoin , macrocrystal-monohydrate, (MACROBID ) 100 MG capsule TAKE 1 CAPSULE(100 MG) BY MOUTH AT BEDTIME. START AFTER COMPLETING FULL DOSE OF AUGMENTIN  (Patient not taking: Reported on 12/03/2023)   No current facility-administered medications on file prior to visit.   Surgical History:  Past Surgical History:  Procedure Laterality Date   ABDOMINAL HYSTERECTOMY  1985   ANTERIOR LAT LUMBAR FUSION N/A 08/14/2018   Procedure: extreme lateral interbody fusion - Lumbar two-Lumbar three  cortical screws and removal pedicle screws Lumbar -Sacral one;  Surgeon: Onetha Kuba, MD;  Location: [facility] OR;  Service: Neurosurgery;  Laterality: N/A;   APPENDECTOMY  1977   CARPAL TUNNEL RELEASE     BIL    CATARACT EXTRACTION W/ INTRAOCULAR LENS IMPLANT Bilateral    CESAREAN SECTION  1982   CLOSED REDUCTION ULNAR SHAFT     COLONOSCOPY     FOOT SURGERY Left 04/11/2022   FOOT SURGERY Left    Fusion   HARDWARE REMOVAL N/A 08/14/2018   Procedure: Removal pedicle screws Lumbar -Sacral one with cortical screw fixation lumbar two-three;  Surgeon: Onetha Kuba, MD;  Location: [facility] OR;  Service: Neurosurgery;  Laterality: N/A;   JOINT  REPLACEMENT     Two foot surgeries   LAMINECTOMY WITH POSTERIOR LATERAL ARTHRODESIS LEVEL 3 N/A 11/10/2023   Procedure: LAMINECTOMY WITH POSTERIOR LATERAL ARTHRODESIS LEVEL THORACIC TEN-THORACI ELEVEN - THORACIC ELEVEN-THORACIC TWELVE - THORACIC TWELVE-LUMBAR ONE with decompressive laminectomy THORACIC ELEVEN-TWELVE;  Surgeon: Onetha Kuba, MD;  Location: [facility] OR;  Service: Neurosurgery;  Laterality: N/A;   NASAL SINUS SURGERY  1994   radial head Right 01/14/2016   at [facility]   RADIAL HEAD ARTHROPLASTY Right 01/26/2016   Procedure: RIGHT RADIAL HEAD REPLACEMENT ARTHROPLASTY WITH LIGAMENT REPAIRS;  Surgeon: Donnice Robinsons, MD;  Location: [facility];  Service: Orthopedics;  Laterality: Right;  Right elbow raial head replacement with possible ligament repairs as needed    SPINE SURGERY  6 spine surgeries   Fusion L1-S1   SUPRACERVICAL ABDOMINAL HYSTERECTOMY  1985   at time of c-section   ULNAR NERVE REPAIR Right    UPPER GASTROINTESTINAL ENDOSCOPY     Allergies:  Allergies  Allergen Reactions   Versed  [Midazolam ] Other (See Comments)    Stroke   Iodine Nausea And Vomiting    During a myelogram  During a myelogram     During a myelogram  During a myelogram     During a myelogram   During a myelogram   Iodinated Contrast Media Nausea And Vomiting and Other (See Comments)   Lipitor [Atorvastatin ] Other (See Comments)    headaches   Tape    Zocor  [Simvastatin ] Other (See Comments)    Reports leg cramps w/ doses over 20mg  daily.   Propoxyphene Hcl Palpitations    darvon    Family History:  Family History  Problem Relation Age of Onset   Dementia Mother    Heart attack Mother    Hypertension Mother    Heart disease Mother    Hyperlipidemia Mother    Heart attack Father 57   Heart disease Father    Sudden death Father  Hyperlipidemia Father    Heart attack Maternal Grandmother 10   Stroke Maternal Grandmother    Colon cancer Neg Hx    Colon polyps Neg Hx     Esophageal cancer Neg Hx    Stomach cancer Neg Hx    Rectal cancer Neg Hx    Bladder Cancer Neg Hx    Uterine cancer Neg Hx    Renal cancer Neg Hx        Objective:    BP 124/78   Pulse 68   Ht 5' 2.5 (1.588 m)   Wt 212 lb (96.2 kg)   BMI 38.16 kg/m   Wt Readings from Last 3 Encounters:  12/03/23 212 lb (96.2 kg)  12/02/23 212 lb (96.2 kg)  11/10/23 212 lb (96.2 kg)    Physical Exam Vitals and nursing note reviewed.  Constitutional:      General: She is not in acute distress.    Appearance: Normal appearance.  HENT:     Head: Normocephalic and atraumatic.     Right Ear: Hearing, tympanic membrane, ear canal and external ear normal.     Left Ear: Hearing, tympanic membrane, ear canal and external ear normal.     Nose: Nose normal.     Right Sinus: No maxillary sinus tenderness or frontal sinus tenderness.     Left Sinus: No maxillary sinus tenderness or frontal sinus tenderness.     Mouth/Throat:     Lips: Pink.     Mouth: Mucous membranes are moist.     Pharynx: Oropharynx is clear.  Eyes:     General: Lids are normal. Vision grossly intact.     Extraocular Movements: Extraocular movements intact.     Conjunctiva/sclera: Conjunctivae normal.     Pupils: Pupils are equal, round, and reactive to light.     Funduscopic exam:    Right eye: Red reflex present.        Left eye: Red reflex present.    Visual Fields: Right eye visual fields normal and left eye visual fields normal.  Neck:     Thyroid : No thyromegaly.     Vascular: No carotid bruit.  Cardiovascular:     Rate and Rhythm: Normal rate and regular rhythm.     Chest Wall: PMI is not displaced.     Pulses: Normal pulses.          Dorsalis pedis pulses are 2+ on the right side and 2+ on the left side.       Posterior tibial pulses are 2+ on the right side and 2+ on the left side.     Heart sounds: Normal heart sounds. No murmur heard. Pulmonary:     Effort: Pulmonary effort is normal. No respiratory  distress.     Breath sounds: Normal breath sounds.  Abdominal:     General: Abdomen is flat. Bowel sounds are normal.     Palpations: Abdomen is soft. There is no hepatomegaly or splenomegaly.     Tenderness: There is no right CVA tenderness or left CVA tenderness.  Musculoskeletal:     Cervical back: Full passive range of motion without pain, normal range of motion and neck supple. No tenderness.     Right lower leg: No edema.     Left lower leg: No edema.     Comments: Recent spinal surgery with limited ROM.   Feet:     Left foot:     Toenail Condition: Left toenails are normal.  Lymphadenopathy:  Cervical: No cervical adenopathy.     Upper Body:     Right upper body: No supraclavicular adenopathy.     Left upper body: No supraclavicular adenopathy.  Skin:    General: Skin is warm and dry.     Capillary Refill: Capillary refill takes less than 2 seconds.     Nails: There is no clubbing.  Neurological:     General: No focal deficit present.     Mental Status: She is alert and oriented to person, place, and time.     GCS: GCS eye subscore is 4. GCS verbal subscore is 5. GCS motor subscore is 6.     Sensory: Sensation is intact. No sensory deficit.     Motor: Motor function is intact. No weakness.     Coordination: Coordination is intact.     Gait: Gait is intact.     Deep Tendon Reflexes: Reflexes are normal and symmetric.  Psychiatric:        Attention and Perception: Attention normal.        Mood and Affect: Mood normal.        Speech: Speech normal.        Behavior: Behavior normal. Behavior is cooperative.        Thought Content: Thought content normal.        Cognition and Memory: Cognition and memory normal.        Judgment: Judgment normal.      Results for orders placed or performed in visit on 12/02/23  CBC with Differential/Platelet   Collection Time: 12/02/23  3:35 PM  Result Value Ref Range   WBC 5.2 3.4 - 10.8 x10E3/uL   RBC 3.51 (L) 3.77 - 5.28  x10E6/uL   Hemoglobin 10.5 (L) 11.1 - 15.9 g/dL   Hematocrit 67.6 (L) 65.9 - 46.6 %   MCV 92 79 - 97 fL   MCH 29.9 26.6 - 33.0 pg   MCHC 32.5 31.5 - 35.7 g/dL   RDW 87.1 88.2 - 84.5 %   Platelets 418 150 - 450 x10E3/uL   Neutrophils 48 Not Estab. %   Lymphs 34 Not Estab. %   Monocytes 9 Not Estab. %   Eos 7 Not Estab. %   Basos 2 Not Estab. %   Neutrophils Absolute 2.5 1.4 - 7.0 x10E3/uL   Lymphocytes Absolute 1.8 0.7 - 3.1 x10E3/uL   Monocytes Absolute 0.5 0.1 - 0.9 x10E3/uL   EOS (ABSOLUTE) 0.3 0.0 - 0.4 x10E3/uL   Basophils Absolute 0.1 0.0 - 0.2 x10E3/uL   Immature Granulocytes 0 Not Estab. %   Immature Grans (Abs) 0.0 0.0 - 0.1 x10E3/uL  CMP14+EGFR   Collection Time: 12/02/23  3:35 PM  Result Value Ref Range   Glucose 101 (H) 70 - 99 mg/dL   BUN 21 8 - 27 mg/dL   Creatinine, Ser 8.87 (H) 0.57 - 1.00 mg/dL   eGFR 54 (L) >40 fO/fpw/8.26   BUN/Creatinine Ratio 19 12 - 28   Sodium 140 134 - 144 mmol/L   Potassium 4.6 3.5 - 5.2 mmol/L   Chloride 105 96 - 106 mmol/L   CO2 24 20 - 29 mmol/L   Calcium  9.7 8.7 - 10.3 mg/dL   Total Protein 6.1 6.0 - 8.5 g/dL   Albumin  4.1 3.9 - 4.9 g/dL   Globulin, Total 2.0 1.5 - 4.5 g/dL   Bilirubin Total 0.2 0.0 - 1.2 mg/dL   Alkaline Phosphatase 76 49 - 135 IU/L   AST 21 0 - 40 IU/L  ALT 14 0 - 32 IU/L  Microalbumin/Creatinine Ratio, Urine   Collection Time: 12/02/23  3:35 PM  Result Value Ref Range   Creatinine, Urine 180.7 Not Estab. mg/dL   Microalbumin, Urine 79.5 Not Estab. ug/mL   Microalb/Creat Ratio 11 0 - 29 mg/g creat       Assessment & Plan:   Problem List Items Addressed This Visit     Essential hypertension   BP well controlled at this time. No alarm symptoms present. Will continue current treatment with lisinopril -hydrochlorothiazide  20-12.5 daily with routine BP monitoring. BP goal <130/80 on average. Diet and exercise recommendations continue with at least 64m of physical activity once surgery recovery is in  place.       Relevant Orders   CBC with Differential/Platelet (Completed)   CMP14+EGFR (Completed)   Body mass index (BMI) 40.0-44.9, adult (HCC)   Management with Mounjaro  7.5mg  at this time. Weight decrease from 231lbs to 212lbs. Appetite has decreased. Managing intermittent nausea well. Will remain on current dose at this time due to recent surgery and recovery. Recommend monitoring diet and work on increasing activity level as she heals from surgery.       Hyperlipidemia   Currently managed with simvastatin  with no concerns. Diet and exercise recommendations in place. LDL goal less than 70 for overall health management given hx of CVA and fatty liver disease.       Myelopathy concurrent with and due to spinal stenosis of thoracic region [facility])   Recent surgery with ongoing healing present Will continue to monitor along with ortho.       Encounter for annual physical exam - Primary   CPE completed today. Review of HM activities and recommendations discussed and provided on AVS. Anticipatory guidance, diet, and exercise recommendations provided. Medications, allergies, and hx reviewed and updated as necessary. Orders placed as listed below.  Plan: - Labs ordered. Will make changes as necessary based on results.  - I will review these results and send recommendations via MyChart or a telephone call.  - F/U with CPE in 1 year or sooner for acute/chronic health needs as directed.        Other Visit Diagnoses       Elevated serum creatinine       Relevant Orders   CBC with Differential/Platelet (Completed)   CMP14+EGFR (Completed)   Microalbumin/Creatinine Ratio, Urine (Completed)         Follow up plan: Return in about 6 months (around 05/31/2024) for Med Management 45.  NEXT PREVENTATIVE PHYSICAL DUE IN 1 YEAR.  PATIENT COUNSELING PROVIDED FOR ALL ADULT PATIENTS: A well balanced diet low in saturated fats, cholesterol, and moderation in carbohydrates.  This can be as  simple as monitoring portion sizes and cutting back on sugary beverages such as soda and juice to start with.    Daily water consumption of at least 64 ounces.  Physical activity at least 180 minutes per week.  If just starting out, start 10 minutes a day and work your way up.   This can be as simple as taking the stairs instead of the elevator and walking 2-3 laps around the office  purposefully every day.   STD protection, partner selection, and regular testing if high risk.  Limited consumption of alcoholic beverages if alcohol is consumed. For men, I recommend no more than 14 alcoholic beverages per week, spread out throughout the week (max 2 per day). Avoid binge drinking or consuming large quantities of alcohol in one setting.  Please let me know if you feel you may need help with reduction or quitting alcohol consumption.   Avoidance of nicotine, if used. Please let me know if you feel you may need help with reduction or quitting nicotine use.   Daily mental health attention. This can be in the form of 5 minute daily meditation, prayer, journaling, yoga, reflection, etc.  Purposeful attention to your emotions and mental state can significantly improve your overall wellbeing  and  Health.  Please know that I am here to help you with all of your health care goals and am happy to work with you to find a solution that works best for you.  The greatest advice I have received with any changes in life are to take it one step at a time, that even means if all you can focus on is the next 60 seconds, then do that and celebrate your victories.  With any changes in life, you will have set backs, and that is OK. The important thing to remember is, if you have a set back, it is not a failure, it is an opportunity to try again! Screening Testing Mammogram Every 1 -2 years based on history and risk factors Starting at age 5 Pap Smear Ages 21-39 every 3 years Ages 37-65 every 5 years with  HPV testing More frequent testing may be required based on results and history Colon Cancer Screening Every 1-10 years based on test performed, risk factors, and history Starting at age 25 Bone Density Screening Every 2-10 years based on history Starting at age 49 for women Recommendations for men differ based on medication usage, history, and risk factors AAA Screening One time ultrasound Men 25-41 years old who have every smoked Lung Cancer Screening Low Dose Lung CT every 12 months Age 30-80 years with a 30 pack-year smoking history who still smoke or who have quit within the last 15 years   Screening Labs Routine  Labs: Complete Blood Count (CBC), Complete Metabolic Panel (CMP), Cholesterol (Lipid Panel) Every 6-12 months based on history and medications May be recommended more frequently based on current conditions or previous results Hemoglobin A1c Lab Every 3-12 months based on history and previous results Starting at age 64 or earlier with diagnosis of diabetes, high cholesterol, BMI >26, and/or risk factors Frequent monitoring for patients with diabetes to ensure blood sugar control Thyroid  Panel (TSH) Every 6 months based on history, symptoms, and risk factors May be repeated more often if on medication HIV One time testing for all patients 62 and older May be repeated more frequently for patients with increased risk factors or exposure Hepatitis C One time testing for all patients 87 and older May be repeated more frequently for patients with increased risk factors or exposure Gonorrhea, Chlamydia Every 12 months for all sexually active persons 13-24 years Additional monitoring may be recommended for those who are considered high risk or who have symptoms Every 12 months for any woman on birth control, regardless of sexual activity PSA Men 35-42 years old with risk factors Additional screening may be recommended from age 75-69 based on risk factors, symptoms, and  history  Vaccine Recommendations Tetanus Booster All adults every 10 years Flu Vaccine All patients 6 months and older every year COVID Vaccine All patients 12 years and older Initial dosing with booster May recommend additional booster based on age and health history HPV Vaccine 2 doses all patients age 34-26 Dosing may be considered for patients over 26 Shingles Vaccine (  Shingrix) 2 doses all adults 55 years and older Pneumonia (Pneumovax 23) All adults 65 years and older May recommend earlier dosing based on health history One year apart from Prevnar 13 Pneumonia (Prevnar 43) All adults 65 years and older Dosed 1 year after Pneumovax 23 Pneumonia (Prevnar 20) One time alternative to the two dosing of 13 and 23 For all adults with initial dose of 23, 20 is recommended 1 year later For all adults with initial dose of 13, 23 is still recommended as second option 1 year later

## 2023-12-02 ENCOUNTER — Ambulatory Visit: Payer: Self-pay | Admitting: Nurse Practitioner

## 2023-12-02 ENCOUNTER — Encounter: Payer: Self-pay | Admitting: Nurse Practitioner

## 2023-12-02 VITALS — BP 124/78 | HR 68 | Ht 62.5 in | Wt 212.0 lb

## 2023-12-02 DIAGNOSIS — I10 Essential (primary) hypertension: Secondary | ICD-10-CM | POA: Diagnosis not present

## 2023-12-02 DIAGNOSIS — Z6841 Body Mass Index (BMI) 40.0 and over, adult: Secondary | ICD-10-CM | POA: Diagnosis not present

## 2023-12-02 DIAGNOSIS — G992 Myelopathy in diseases classified elsewhere: Secondary | ICD-10-CM

## 2023-12-02 DIAGNOSIS — M4804 Spinal stenosis, thoracic region: Secondary | ICD-10-CM

## 2023-12-02 DIAGNOSIS — E782 Mixed hyperlipidemia: Secondary | ICD-10-CM

## 2023-12-02 DIAGNOSIS — R7989 Other specified abnormal findings of blood chemistry: Secondary | ICD-10-CM | POA: Diagnosis not present

## 2023-12-02 DIAGNOSIS — Z0000 Encounter for general adult medical examination without abnormal findings: Secondary | ICD-10-CM

## 2023-12-02 NOTE — Patient Instructions (Signed)
 For all adult patients, I recommend A well balanced diet low in saturated fats, cholesterol, and moderation in carbohydrates.   This can be as simple as monitoring portion sizes and cutting back on sugary beverages such as soda and juice to start with.    Daily water consumption of at least 64 ounces.  Physical activity at least 180 minutes per week, if just starting out.   This can be as simple as taking the stairs instead of the elevator and walking 2-3 laps around the office  purposefully every day.   STD protection, partner selection, and regular testing if high risk.  Limited consumption of alcoholic beverages if alcohol is consumed.  For women, I recommend no more than 7 alcoholic beverages per week, spread out throughout the week.  Avoid binge drinking or consuming large quantities of alcohol in one setting.   Please let me know if you feel you may need help with reduction or quitting alcohol consumption.   Avoidance of nicotine, if used.  Please let me know if you feel you may need help with reduction or quitting nicotine use.   Daily mental health attention.  This can be in the form of 5 minute daily meditation, prayer, journaling, yoga, reflection, etc.   Purposeful attention to your emotions and mental state can significantly improve your overall wellbeing  and  Health.  Please know that I am here to help you with all of your health care goals and am happy to work with you to find a solution that works best for you.  The greatest advice I have received with any changes in life are to take it one step at a time, that even means if all you can focus on is the next 60 seconds, then do that and celebrate your victories.  With any changes in life, you will have set backs, and that is OK. The important thing to remember is, if you have a set back, it is not a failure, it is an opportunity to try again!  Health Maintenance Recommendations Screening Testing Mammogram Every 1 -2  years based on history and risk factors Starting at age 15 Pap Smear Ages 21-39 every 3 years Ages 9-65 every 5 years with HPV testing More frequent testing may be required based on results and history Colon Cancer Screening Every 1-10 years based on test performed, risk factors, and history Starting at age 56 Bone Density Screening Every 2-10 years based on history Starting at age 55 for women Recommendations for men differ based on medication usage, history, and risk factors AAA Screening One time ultrasound Men 102-69 years old who have every smoked Lung Cancer Screening Low Dose Lung CT every 12 months Age 76-80 years with a 30 pack-year smoking history who still smoke or who have quit within the last 15 years  Screening Labs Routine  Labs: Complete Blood Count (CBC), Complete Metabolic Panel (CMP), Cholesterol (Lipid Panel) Every 6-12 months based on history and medications May be recommended more frequently based on current conditions or previous results Hemoglobin A1c Lab Every 3-12 months based on history and previous results Starting at age 11 or earlier with diagnosis of diabetes, high cholesterol, BMI >26, and/or risk factors Frequent monitoring for patients with diabetes to ensure blood sugar control Thyroid Panel (TSH w/ T3 & T4) Every 6 months based on history, symptoms, and risk factors May be repeated more often if on medication HIV One time testing for all patients 85 and older May be  repeated more frequently for patients with increased risk factors or exposure Hepatitis C One time testing for all patients 18 and older May be repeated more frequently for patients with increased risk factors or exposure Gonorrhea, Chlamydia Every 12 months for all sexually active persons 13-24 years Additional monitoring may be recommended for those who are considered high risk or who have symptoms PSA Men 60-66 years old with risk factors Additional screening may be  recommended from age 30-69 based on risk factors, symptoms, and history  Vaccine Recommendations Tetanus Booster All adults every 10 years Flu Vaccine All patients 6 months and older every year COVID Vaccine All patients 12 years and older Initial dosing with booster May recommend additional booster based on age and health history HPV Vaccine 2 doses all patients age 29-26 Dosing may be considered for patients over 26 Shingles Vaccine (Shingrix) 2 doses all adults 55 years and older Pneumonia (Pneumovax 23) All adults 65 years and older May recommend earlier dosing based on health history Pneumonia (Prevnar 12) All adults 65 years and older Dosed 1 year after Pneumovax 23  Additional Screening, Testing, and Vaccinations may be recommended on an individualized basis based on family history, health history, risk factors, and/or exposure.

## 2023-12-03 ENCOUNTER — Ambulatory Visit: Admitting: Podiatry

## 2023-12-03 ENCOUNTER — Encounter: Payer: Self-pay | Admitting: Podiatry

## 2023-12-03 ENCOUNTER — Ambulatory Visit: Payer: Self-pay | Admitting: Nurse Practitioner

## 2023-12-03 VITALS — Ht 62.5 in | Wt 212.0 lb

## 2023-12-03 DIAGNOSIS — M19071 Primary osteoarthritis, right ankle and foot: Secondary | ICD-10-CM

## 2023-12-03 LAB — CMP14+EGFR
ALT: 14 IU/L (ref 0–32)
AST: 21 IU/L (ref 0–40)
Albumin: 4.1 g/dL (ref 3.9–4.9)
Alkaline Phosphatase: 76 IU/L (ref 49–135)
BUN/Creatinine Ratio: 19 (ref 12–28)
BUN: 21 mg/dL (ref 8–27)
Bilirubin Total: 0.2 mg/dL (ref 0.0–1.2)
CO2: 24 mmol/L (ref 20–29)
Calcium: 9.7 mg/dL (ref 8.7–10.3)
Chloride: 105 mmol/L (ref 96–106)
Creatinine, Ser: 1.12 mg/dL — AB (ref 0.57–1.00)
Globulin, Total: 2 g/dL (ref 1.5–4.5)
Glucose: 101 mg/dL — AB (ref 70–99)
Potassium: 4.6 mmol/L (ref 3.5–5.2)
Sodium: 140 mmol/L (ref 134–144)
Total Protein: 6.1 g/dL (ref 6.0–8.5)
eGFR: 54 mL/min/1.73 — AB (ref >59)

## 2023-12-03 LAB — CBC WITH DIFFERENTIAL/PLATELET
Basophils Absolute: 0.1 x10E3/uL (ref 0.0–0.2)
Basos: 2 %
EOS (ABSOLUTE): 0.3 x10E3/uL (ref 0.0–0.4)
Eos: 7 %
Hematocrit: 32.3 % — ABNORMAL LOW (ref 34.0–46.6)
Hemoglobin: 10.5 g/dL — ABNORMAL LOW (ref 11.1–15.9)
Immature Grans (Abs): 0 x10E3/uL (ref 0.0–0.1)
Immature Granulocytes: 0 %
Lymphocytes Absolute: 1.8 x10E3/uL (ref 0.7–3.1)
Lymphs: 34 %
MCH: 29.9 pg (ref 26.6–33.0)
MCHC: 32.5 g/dL (ref 31.5–35.7)
MCV: 92 fL (ref 79–97)
Monocytes Absolute: 0.5 x10E3/uL (ref 0.1–0.9)
Monocytes: 9 %
Neutrophils Absolute: 2.5 x10E3/uL (ref 1.4–7.0)
Neutrophils: 48 %
Platelets: 418 x10E3/uL (ref 150–450)
RBC: 3.51 x10E6/uL — ABNORMAL LOW (ref 3.77–5.28)
RDW: 12.8 % (ref 11.7–15.4)
WBC: 5.2 x10E3/uL (ref 3.4–10.8)

## 2023-12-03 LAB — MICROALBUMIN / CREATININE URINE RATIO
Creatinine, Urine: 180.7 mg/dL
Microalb/Creat Ratio: 11 mg/g{creat} (ref 0–29)
Microalbumin, Urine: 20.4 ug/mL

## 2023-12-03 NOTE — Progress Notes (Signed)
   Chief Complaint  Patient presents with   Foot Pain    Pt is here due to right foot pain, requesting injection.    Subjective: 68 y.o. female presenting today for follow-up evaluation of right foot arthritis.  She continues to have pain and tenderness.  Injections in the past have helped   Past Medical History:  Diagnosis Date   Acute cystitis with hematuria 01/20/2022   Allergy    Anxiety 01-15-21   Arthritis    Back pain    Complication of anesthesia    HX Back CHIPPED TOOTH S/P EXTUBATION    Complication of anesthesia    Pt received too much versed  during induction which led to ischemic stroke   Coronary artery disease    Diabetes mellitus without complication (HCC)    Family history of adverse reaction to anesthesia    Mother had N/V   Hyperlipidemia    Hypertension    Ischemic stroke (HCC)    occured during back surgery   Low back pain radiating to right leg    Mixed hypercholesterolemia and 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  Dermatology: Skin is warm, dry and supple bilateral lower extremities. Negative for open lesions or macerations bilateral.   Vascular: Dorsalis Pedis and Posterior Tibial pulses palpable bilateral.  Capillary fill time is immediate to all digits.  Neurological: Grossly intact via light touch  Musculoskeletal: Tenderness to palpation to the dorsal aspect of the right midfoot all other joints range of motion within normal limits bilateral. Strength 5/5 in all groups bilateral.   Radiographic exam RT foot 05/28/2023: Normal osseous mineralization.  No acute fractures identified.  Degenerative changes noted throughout the TMT specifically the 2nd and 3rd of the right foot.  Assessment: 1. Plantar  fasciitis right; currently minimally symptomatic 2. H/o third TMT revisional arthrodesis left with harvesting of bone autograft left calcaneus. DOS: 3/21/202; stable with routine healing 3.  RT midfoot DJD/arthritis   Plan of Care:  -Patient evaluated -Injection of 0.5 cc Celestone  Soluspan injected in the right dorsal midtarsal joint - Continue meloxicam  15 mg daily as needed -Return to clinic with me as needed  Thresa EMERSON Sar, DPM [facility]  Dr. Thresa EMERSON Sar, DPM    [address] [place], [place] [address]                Office [phone]  Fax [phone]

## 2023-12-08 DIAGNOSIS — M19071 Primary osteoarthritis, right ankle and foot: Secondary | ICD-10-CM | POA: Diagnosis not present

## 2023-12-08 MED ORDER — BETAMETHASONE SOD PHOS & ACET 6 (3-3) MG/ML IJ SUSP
3.0000 mg | Freq: Once | INTRAMUSCULAR | Status: AC
  Administered 2023-12-08: 3 mg via INTRA_ARTICULAR

## 2023-12-15 ENCOUNTER — Encounter: Payer: Self-pay | Admitting: Nurse Practitioner

## 2023-12-15 DIAGNOSIS — Z0000 Encounter for general adult medical examination without abnormal findings: Secondary | ICD-10-CM | POA: Insufficient documentation

## 2023-12-15 NOTE — Assessment & Plan Note (Addendum)
 Currently managed with simvastatin  with no concerns. Diet and exercise recommendations in place. LDL goal less than 70 for overall health management given hx of CVA and fatty liver disease.

## 2023-12-15 NOTE — Assessment & Plan Note (Signed)
 BP well controlled at this time. No alarm symptoms present. Will continue current treatment with lisinopril -hydrochlorothiazide  20-12.5 daily with routine BP monitoring. BP goal <130/80 on average. Diet and exercise recommendations continue with at least 73m of physical activity once surgery recovery is in place.

## 2023-12-15 NOTE — Assessment & Plan Note (Signed)

## 2023-12-15 NOTE — Assessment & Plan Note (Signed)
 Management with Mounjaro  7.5mg  at this time. Weight decrease from 231lbs to 212lbs. Appetite has decreased. Managing intermittent nausea well. Will remain on current dose at this time due to recent surgery and recovery. Recommend monitoring diet and work on increasing activity level as she heals from surgery.

## 2023-12-15 NOTE — Assessment & Plan Note (Signed)
 Recent surgery with ongoing healing present Will continue to monitor along with ortho.

## 2024-01-19 ENCOUNTER — Other Ambulatory Visit: Payer: Self-pay | Admitting: Podiatry

## 2024-02-02 ENCOUNTER — Encounter: Payer: Self-pay | Admitting: *Deleted

## 2024-02-04 ENCOUNTER — Encounter: Payer: Self-pay | Admitting: Podiatry

## 2024-02-04 ENCOUNTER — Ambulatory Visit (INDEPENDENT_AMBULATORY_CARE_PROVIDER_SITE_OTHER): Admitting: Podiatry

## 2024-02-04 VITALS — Ht 62.5 in | Wt 212.0 lb

## 2024-02-04 DIAGNOSIS — M19071 Primary osteoarthritis, right ankle and foot: Secondary | ICD-10-CM

## 2024-02-04 MED ORDER — BETAMETHASONE SOD PHOS & ACET 6 (3-3) MG/ML IJ SUSP
3.0000 mg | Freq: Once | INTRAMUSCULAR | Status: AC
  Administered 2024-02-04: 3 mg via INTRA_ARTICULAR

## 2024-02-04 NOTE — Progress Notes (Signed)
" ° °  Chief Complaint  Patient presents with   Foot Pain    Pt is here due to right foot, she would like an injection.    Subjective: 69 y.o. female presenting today for follow-up evaluation of right foot arthritis.  She continues to have pain and tenderness.  Injections in the past have helped   Past Medical History:  Diagnosis Date   Acute cystitis with hematuria 01/20/2022   Allergy    Anxiety 01-15-21   Arthritis    Back pain    Complication of anesthesia    HX Back CHIPPED TOOTH S/P EXTUBATION    Complication of anesthesia    Pt received too much versed  during induction which led to ischemic stroke   Coronary artery disease    Diabetes mellitus without complication (HCC)    Family history of adverse reaction to anesthesia    Mother had N/V   Hyperlipidemia    Hypertension    Ischemic stroke (HCC)    occured during back surgery   Low back pain radiating to right leg    Mixed hypercholesterolemia and 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  Dermatology: Skin is warm, dry and supple bilateral lower extremities. Negative for open lesions or macerations bilateral.   Vascular: Dorsalis Pedis and Posterior Tibial pulses palpable bilateral.  Capillary fill time is immediate to all digits.  Neurological: Grossly intact via light touch  Musculoskeletal: Tenderness to palpation to the dorsal aspect of the right midfoot all other joints range of motion within normal limits bilateral. Strength 5/5 in all groups bilateral.   Radiographic exam RT foot 05/28/2023: Normal osseous mineralization.  No acute fractures identified.  Degenerative changes noted throughout the TMT specifically the 2nd and 3rd of the right foot.  Assessment: 1. Plantar  fasciitis right; currently minimally symptomatic 2. H/o third TMT revisional arthrodesis left with harvesting of bone autograft left calcaneus. DOS: 3/21/202; stable with routine healing 3.  RT midfoot DJD/arthritis   Plan of Care:  -Patient evaluated -Injection of 0.5 cc Celestone  Soluspan injected in the right dorsal midtarsal joint - Continue meloxicam  15 mg daily as needed -Return to clinic with me as needed  Thresa EMERSON Sar, DPM [facility]  Dr. Thresa EMERSON Sar, DPM    [address] [place], [place] [address]                Office [phone]  Fax [phone]     "

## 2024-02-09 ENCOUNTER — Telehealth: Payer: Self-pay

## 2024-02-09 ENCOUNTER — Encounter: Payer: Self-pay | Admitting: Nurse Practitioner

## 2024-02-09 NOTE — Telephone Encounter (Signed)
 Copied from CRM [id number]. Topic: Clinical - Medication Refill >> Feb 09, 2024 10:33 AM Jeoffrey H wrote: Medication: tirzepatide  (MOUNJARO ) 7.5 MG/0.5ML Pen  Has the patient contacted their pharmacy? Yes, pharmacy stated they sent a request for med refill and advised patient to contact provider.  (Agent: If no, request that the patient contact the pharmacy for the refill. If patient does not wish to contact the pharmacy document the reason why and proceed with request.) (Agent: If yes, when and what did the pharmacy advise?)  This is the patient's preferred pharmacy:  [id number] [place], [place] - [address] AT [facility] OF [facility] [address] & [facility] [address] [place] [address] Phone: [phone] Fax: [phone]  Is this the correct pharmacy for this prescription? Yes If no, delete pharmacy and type the correct one.   Has the prescription been filled recently? Yes  Is the patient out of the medication? Yes  Has the patient been seen for an appointment in the last year OR does the patient have an upcoming appointment? Yes, 12/02/2023  Can we respond through MyChart? Yes  Agent: Please be advised that Rx refills may take up to 3 business days. We ask that you follow-up with your pharmacy.

## 2024-02-10 ENCOUNTER — Other Ambulatory Visit: Payer: Self-pay

## 2024-02-10 MED ORDER — TIRZEPATIDE 7.5 MG/0.5ML ~~LOC~~ SOAJ: 7.5000 mg | SUBCUTANEOUS | 3 refills | Status: AC

## 2024-02-11 ENCOUNTER — Ambulatory Visit: Admitting: Podiatry

## 2024-04-09 ENCOUNTER — Ambulatory Visit: Admitting: Nurse Practitioner

## 2024-05-31 ENCOUNTER — Ambulatory Visit: Admitting: Nurse Practitioner

## 2024-06-01 ENCOUNTER — Ambulatory Visit: Payer: Self-pay

## 2024-12-13 ENCOUNTER — Encounter: Admitting: Nurse Practitioner
